# Patient Record
Sex: Female | Born: 1955 | Race: White | Hispanic: No | Smoking: Former smoker
Health system: Southern US, Community
[De-identification: ages and names within clinical notes are randomized; demographics above are authoritative.]

## PROBLEM LIST (undated history)

## (undated) DIAGNOSIS — I10 Essential (primary) hypertension: Secondary | ICD-10-CM

## (undated) DIAGNOSIS — K760 Fatty (change of) liver, not elsewhere classified: Secondary | ICD-10-CM

## (undated) DIAGNOSIS — E039 Hypothyroidism, unspecified: Secondary | ICD-10-CM

## (undated) DIAGNOSIS — F329 Major depressive disorder, single episode, unspecified: Secondary | ICD-10-CM

## (undated) DIAGNOSIS — Z87442 Personal history of urinary calculi: Secondary | ICD-10-CM

## (undated) DIAGNOSIS — M25569 Pain in unspecified knee: Secondary | ICD-10-CM

## (undated) DIAGNOSIS — M109 Gout, unspecified: Secondary | ICD-10-CM

## (undated) DIAGNOSIS — G2581 Restless legs syndrome: Secondary | ICD-10-CM

## (undated) DIAGNOSIS — M25519 Pain in unspecified shoulder: Secondary | ICD-10-CM

## (undated) DIAGNOSIS — M1990 Unspecified osteoarthritis, unspecified site: Secondary | ICD-10-CM

## (undated) DIAGNOSIS — I2699 Other pulmonary embolism without acute cor pulmonale: Secondary | ICD-10-CM

## (undated) DIAGNOSIS — Z8614 Personal history of Methicillin resistant Staphylococcus aureus infection: Secondary | ICD-10-CM

## (undated) DIAGNOSIS — I509 Heart failure, unspecified: Secondary | ICD-10-CM

## (undated) DIAGNOSIS — F32A Depression, unspecified: Secondary | ICD-10-CM

## (undated) DIAGNOSIS — D649 Anemia, unspecified: Secondary | ICD-10-CM

## (undated) DIAGNOSIS — N200 Calculus of kidney: Secondary | ICD-10-CM

## (undated) DIAGNOSIS — J45909 Unspecified asthma, uncomplicated: Secondary | ICD-10-CM

## (undated) DIAGNOSIS — K219 Gastro-esophageal reflux disease without esophagitis: Secondary | ICD-10-CM

## (undated) DIAGNOSIS — E119 Type 2 diabetes mellitus without complications: Secondary | ICD-10-CM

## (undated) HISTORY — PX: CHOLECYSTECTOMY: SHX55

## (undated) HISTORY — PX: THUMB ARTHROSCOPY: SHX2509

## (undated) HISTORY — PX: FOOT SURGERY: SHX648

## (undated) HISTORY — DX: Type 2 diabetes mellitus without complications: E11.9

## (undated) HISTORY — DX: Calculus of kidney: N20.0

## (undated) HISTORY — PX: TONSILLECTOMY: SUR1361

## (undated) HISTORY — PX: BACK SURGERY: SHX140

## (undated) HISTORY — DX: Gastro-esophageal reflux disease without esophagitis: K21.9

## (undated) HISTORY — PX: KNEE ARTHROSCOPY: SHX127

## (undated) HISTORY — PX: BREAST REDUCTION SURGERY: SHX8

## (undated) HISTORY — PX: ABDOMINAL HYSTERECTOMY: SHX81

## (undated) HISTORY — PX: GASTRIC BYPASS: SHX52

## (undated) MED FILL — Iron Sucrose Inj 20 MG/ML (Fe Equiv): INTRAVENOUS | Qty: 10 | Status: AC

---

## 2004-12-05 ENCOUNTER — Ambulatory Visit: Payer: Self-pay | Admitting: Unknown Physician Specialty

## 2005-02-04 ENCOUNTER — Ambulatory Visit: Payer: Self-pay | Admitting: Unknown Physician Specialty

## 2005-03-10 ENCOUNTER — Ambulatory Visit: Payer: Self-pay | Admitting: Surgery

## 2005-03-11 ENCOUNTER — Ambulatory Visit: Payer: Self-pay | Admitting: Surgery

## 2005-03-23 ENCOUNTER — Ambulatory Visit: Payer: Self-pay | Admitting: Unknown Physician Specialty

## 2006-02-14 ENCOUNTER — Emergency Department: Payer: Self-pay | Admitting: Emergency Medicine

## 2006-07-06 ENCOUNTER — Emergency Department: Payer: Self-pay | Admitting: Emergency Medicine

## 2006-11-03 ENCOUNTER — Ambulatory Visit: Payer: Self-pay

## 2008-01-14 ENCOUNTER — Emergency Department: Payer: Self-pay | Admitting: Emergency Medicine

## 2008-07-29 ENCOUNTER — Ambulatory Visit: Payer: Self-pay | Admitting: Family Medicine

## 2008-09-10 ENCOUNTER — Ambulatory Visit: Payer: Self-pay

## 2009-02-06 ENCOUNTER — Ambulatory Visit: Payer: Self-pay | Admitting: Family Medicine

## 2009-09-03 ENCOUNTER — Ambulatory Visit: Payer: Self-pay

## 2009-09-11 ENCOUNTER — Ambulatory Visit: Payer: Self-pay

## 2009-11-30 ENCOUNTER — Emergency Department: Payer: Self-pay | Admitting: Emergency Medicine

## 2009-11-30 DIAGNOSIS — I2699 Other pulmonary embolism without acute cor pulmonale: Secondary | ICD-10-CM

## 2009-11-30 HISTORY — DX: Other pulmonary embolism without acute cor pulmonale: I26.99

## 2010-11-16 ENCOUNTER — Emergency Department: Payer: Self-pay | Admitting: Emergency Medicine

## 2010-12-04 ENCOUNTER — Ambulatory Visit: Payer: Self-pay

## 2011-10-04 ENCOUNTER — Emergency Department: Payer: Self-pay | Admitting: Unknown Physician Specialty

## 2013-05-03 DIAGNOSIS — M7061 Trochanteric bursitis, right hip: Secondary | ICD-10-CM | POA: Insufficient documentation

## 2013-05-25 DIAGNOSIS — E538 Deficiency of other specified B group vitamins: Secondary | ICD-10-CM | POA: Insufficient documentation

## 2013-08-18 DIAGNOSIS — E785 Hyperlipidemia, unspecified: Secondary | ICD-10-CM | POA: Insufficient documentation

## 2013-09-20 ENCOUNTER — Ambulatory Visit (INDEPENDENT_AMBULATORY_CARE_PROVIDER_SITE_OTHER): Payer: BC Managed Care – PPO | Admitting: Podiatry

## 2013-09-20 ENCOUNTER — Encounter: Payer: Self-pay | Admitting: Podiatry

## 2013-09-20 ENCOUNTER — Ambulatory Visit (INDEPENDENT_AMBULATORY_CARE_PROVIDER_SITE_OTHER): Payer: BC Managed Care – PPO

## 2013-09-20 VITALS — BP 115/73 | HR 82 | Resp 16 | Ht 65.0 in | Wt 170.0 lb

## 2013-09-20 DIAGNOSIS — R52 Pain, unspecified: Secondary | ICD-10-CM

## 2013-09-20 DIAGNOSIS — IMO0002 Reserved for concepts with insufficient information to code with codable children: Secondary | ICD-10-CM

## 2013-09-20 DIAGNOSIS — G629 Polyneuropathy, unspecified: Secondary | ICD-10-CM

## 2013-09-20 DIAGNOSIS — G589 Mononeuropathy, unspecified: Secondary | ICD-10-CM

## 2013-09-20 NOTE — Patient Instructions (Signed)
WEAR CAM BOOT TO LEFT FOOT FOR FOUR WEEKS.  OUT OF WORK FOR 2 WEEKS.

## 2013-09-20 NOTE — Progress Notes (Signed)
Catalina presents today for chief complaint of a painful medial aspect of the left foot and some numbness to the medial longitudinal arch bilateral. States that the left foot was injured a few weeks ago not recalling the trauma. She states that she was seen by an orthopedist as well as urgent care who her in a Cam Walker for fracture. She relates that she did not wear the Cam Walker for very long due to her piriformis syndrome right and a torn the tendon in her left hip. At this point she has considerable swelling and pain in the medial foot. She's noted that the numbness in her arches a started spread.   Objective: Vital signs are stable she is alert and oriented x3. I have reviewed her past medical history medications and allergies. Pulses are palpable bilateral. She has pain on palpation of the tibialis anterior tendon at the insertion on the medial cuneiform left. Extremely tender on palpation. Body overlying edema. Radiographs evaluation does demonstrate what appears to be a mildly displaced fracture of the medial cuneiform with the attachment of the tibialis anterior tendon. She still has sensory input with normal neurologic sensorium per since once the monofilament.  Assessment: Fractured medial cuneiform probable tibialis anterior tendinitis. 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  Plan: We discussed etiology pathology conservative versus surgical therapies. I expressed to her that her best that would be to wear the Cam Walker for at least one month. She's allowed to ice this as much she wants to be she is not allowed to walk on it without the Cam Walker. She's going to continue to work on her on deficiency anemia with her primary Dr. Followup with her one month.

## 2013-10-06 DIAGNOSIS — L709 Acne, unspecified: Secondary | ICD-10-CM | POA: Insufficient documentation

## 2013-10-23 ENCOUNTER — Ambulatory Visit: Payer: BC Managed Care – PPO | Admitting: Podiatry

## 2014-02-26 DIAGNOSIS — M7710 Lateral epicondylitis, unspecified elbow: Secondary | ICD-10-CM | POA: Insufficient documentation

## 2014-04-10 DIAGNOSIS — A6000 Herpesviral infection of urogenital system, unspecified: Secondary | ICD-10-CM | POA: Insufficient documentation

## 2014-04-10 DIAGNOSIS — G4700 Insomnia, unspecified: Secondary | ICD-10-CM | POA: Insufficient documentation

## 2014-05-18 DIAGNOSIS — M19049 Primary osteoarthritis, unspecified hand: Secondary | ICD-10-CM | POA: Insufficient documentation

## 2014-05-18 DIAGNOSIS — M654 Radial styloid tenosynovitis [de Quervain]: Secondary | ICD-10-CM | POA: Insufficient documentation

## 2014-06-14 DIAGNOSIS — Z0000 Encounter for general adult medical examination without abnormal findings: Secondary | ICD-10-CM | POA: Insufficient documentation

## 2014-08-16 DIAGNOSIS — Z86010 Personal history of colonic polyps: Secondary | ICD-10-CM | POA: Insufficient documentation

## 2014-10-22 DIAGNOSIS — Z9884 Bariatric surgery status: Secondary | ICD-10-CM | POA: Insufficient documentation

## 2014-11-05 ENCOUNTER — Ambulatory Visit: Payer: Self-pay | Admitting: Ophthalmology

## 2014-11-15 DIAGNOSIS — J209 Acute bronchitis, unspecified: Secondary | ICD-10-CM | POA: Insufficient documentation

## 2014-11-15 DIAGNOSIS — J208 Acute bronchitis due to other specified organisms: Secondary | ICD-10-CM | POA: Insufficient documentation

## 2014-11-30 DIAGNOSIS — Z8614 Personal history of Methicillin resistant Staphylococcus aureus infection: Secondary | ICD-10-CM

## 2014-11-30 HISTORY — DX: Personal history of Methicillin resistant Staphylococcus aureus infection: Z86.14

## 2015-04-27 ENCOUNTER — Emergency Department
Admission: EM | Admit: 2015-04-27 | Discharge: 2015-04-27 | Disposition: A | Discharge status: Home / Self Care | Payer: BC Managed Care – PPO | Attending: Emergency Medicine | Admitting: Emergency Medicine

## 2015-04-27 ENCOUNTER — Emergency Department: Payer: BC Managed Care – PPO

## 2015-04-27 ENCOUNTER — Encounter: Payer: Self-pay | Admitting: Emergency Medicine

## 2015-04-27 DIAGNOSIS — Z720 Tobacco use: Secondary | ICD-10-CM | POA: Diagnosis not present

## 2015-04-27 DIAGNOSIS — Y9289 Other specified places as the place of occurrence of the external cause: Secondary | ICD-10-CM | POA: Insufficient documentation

## 2015-04-27 DIAGNOSIS — Z880 Allergy status to penicillin: Secondary | ICD-10-CM | POA: Insufficient documentation

## 2015-04-27 DIAGNOSIS — Y998 Other external cause status: Secondary | ICD-10-CM | POA: Insufficient documentation

## 2015-04-27 DIAGNOSIS — Y9389 Activity, other specified: Secondary | ICD-10-CM | POA: Diagnosis not present

## 2015-04-27 DIAGNOSIS — E119 Type 2 diabetes mellitus without complications: Secondary | ICD-10-CM | POA: Diagnosis not present

## 2015-04-27 DIAGNOSIS — Z79899 Other long term (current) drug therapy: Secondary | ICD-10-CM | POA: Insufficient documentation

## 2015-04-27 DIAGNOSIS — X58XXXA Exposure to other specified factors, initial encounter: Secondary | ICD-10-CM | POA: Insufficient documentation

## 2015-04-27 DIAGNOSIS — S8992XA Unspecified injury of left lower leg, initial encounter: Secondary | ICD-10-CM | POA: Diagnosis present

## 2015-04-27 DIAGNOSIS — S86912A Strain of unspecified muscle(s) and tendon(s) at lower leg level, left leg, initial encounter: Secondary | ICD-10-CM

## 2015-04-27 MED ORDER — TRAMADOL HCL 50 MG PO TABS: 50.0000 mg | ORAL_TABLET | Freq: Two times a day (BID) | ORAL | Status: DC

## 2015-04-27 NOTE — ED Notes (Signed)
No fall

## 2015-04-27 NOTE — Discharge Instructions (Signed)
Knee Sprain A knee sprain is a tear in one of the strong, fibrous tissues that connect the bones (ligaments) in your knee. The severity of the sprain depends on how much of the ligament is torn. The tear can be either partial or complete. CAUSES  Often, sprains are a result of a fall or injury. The force of the impact causes the fibers of your ligament to stretch too much. This excess tension causes the fibers of your ligament to tear. SIGNS AND SYMPTOMS  You may have some loss of motion in your knee. Other symptoms include:  Bruising.  Pain in the knee area.  Tenderness of the knee to the touch.  Swelling. DIAGNOSIS  To diagnose a knee sprain, your health care provider will physically examine your knee. Your health care provider may also suggest an X-ray exam of your knee to make sure no bones are broken. TREATMENT  If your ligament is only partially torn, treatment usually involves keeping the knee in a fixed position (immobilization) or bracing your knee for activities that require movement for several weeks. To do this, your health care provider will apply a bandage, cast, or splint to keep your knee from moving and to support your knee during movement until it heals. For a partially torn ligament, the healing process usually takes 4-6 weeks. If your ligament is completely torn, depending on which ligament it is, you may need surgery to reconnect the ligament to the bone or reconstruct it. After surgery, a cast or splint may be applied and will need to stay on your knee for 4-6 weeks while your ligament heals. HOME CARE INSTRUCTIONS  Keep your injured knee elevated to decrease swelling.  To ease pain and swelling, apply ice to the injured area:  Put ice in a plastic bag.  Place a towel between your skin and the bag.  Leave the ice on for 20 minutes, 2-3 times a day.  Only take medicine for pain as directed by your health care provider.  Do not leave your knee unprotected until  pain and stiffness go away (usually 4-6 weeks).  If you have a cast or splint, do not allow it to get wet. If you have been instructed not to remove it, cover it with a plastic bag when you shower or bathe. Do not swim.  Your health care provider may suggest exercises for you to do during your recovery to prevent or limit permanent weakness and stiffness. SEEK IMMEDIATE MEDICAL CARE IF:  Your cast or splint becomes damaged.  Your pain becomes worse.  You have significant pain, swelling, or numbness below the cast or splint. MAKE SURE YOU:  Understand these instructions.  Will watch your condition.  Will get help right away if you are not doing well or get worse. Document Released: 11/16/2005 Document Revised: 09/06/2013 Document Reviewed: 06/28/2013 [facility] Patient Information 2015 [place], [place]. This information is not intended to replace advice given to you by your health care provider. Make sure you discuss any questions you have with your health care provider.  Wear the knee immobilizer as needed for pain relief.  Follow-up with ortho for continued problems.  Rest with the knee elevated and apply ice to reduce symptoms.

## 2015-04-27 NOTE — ED Provider Notes (Signed)
[facility] Emergency Department Provider Note ____________________________________________  Time seen: 1517  I have reviewed the triage vital signs and the nursing notes.  HISTORY  Chief Complaint Knee Pain  HPI Courtney Murphy is a 59 y.o. female with a 2-week c/o  Increasing left knee pain.  She denies  Injury until today when she describes stepping in a divet in the lawn. She twisted her knee causing pain and some mild swelling. She notes pain laterally and inferiorly. She has been weight-bearing with pain, but denies, catch, lock or giveway.   Past Medical History  Diagnosis Date  . Diabetes mellitus without complication    There are no active problems to display for this patient.  Past Surgical History  Procedure Laterality Date  . Gastric bypass    . Abdominal hysterectomy    . Foot surgery    . Breast reduction surgery     Current Outpatient Rx  Name  Route  Sig  Dispense  Refill  . Biotin 7500 MCG TABS   Oral   Take 7,500 mcg by mouth daily.         Marland Kitchen escitalopram (LEXAPRO) 20 MG tablet   Oral   Take 20 mg by mouth 2 (two) times daily.         . furosemide (LASIX) 20 MG tablet   Oral   Take 20 mg by mouth.         . levothyroxine (SYNTHROID, LEVOTHROID) 100 MCG tablet   Oral   Take 100 mcg by mouth daily before breakfast.         . lisinopril (PRINIVIL,ZESTRIL) 20 MG tablet   Oral   Take 20 mg by mouth daily.         . pramipexole (MIRAPEX) 0.5 MG tablet   Oral   Take 0.5 mg by mouth daily.         . traMADol (ULTRAM) 50 MG tablet   Oral   Take 50 mg by mouth as needed for pain.         . traMADol (ULTRAM) 50 MG tablet   Oral   Take 1 tablet (50 mg total) by mouth 2 (two) times daily.   10 tablet   0    Allergies Penicillins and Sulfa antibiotics  No family history on file.  Social History History  Substance Use Topics  . Smoking status: Current Every Day Smoker -- 0.50 packs/day    Types:  Cigarettes  . Smokeless tobacco: Never Used  . Alcohol Use: No   Review of Systems  Constitutional: Negative for fever. Eyes: Negative for visual changes. ENT: Negative for sore throat. Cardiovascular: Negative for chest pain. Respiratory: Negative for shortness of breath. Gastrointestinal: Negative for abdominal pain, vomiting and diarrhea. Genitourinary: Negative for dysuria. Musculoskeletal: Negative for back pain. Positive for knee pain. Skin: Negative for rash. Neurological: Negative for headaches, focal weakness or numbness. ____________________________________________  PHYSICAL EXAM:  VITAL SIGNS: ED Triage Vitals  Enc Vitals Group     BP 04/27/15 1425 129/60 mmHg     Pulse Rate 04/27/15 1425 97     Resp 04/27/15 1425 18     Temp 04/27/15 1425 97.9 F (36.6 C)     Temp Source 04/27/15 1425 Oral     SpO2 04/27/15 1425 99 %     Weight 04/27/15 1425 190 lb (86.183 kg)     Height 04/27/15 1425 5\' 4"  (1.626 m)     Head Cir --  Peak Flow --      Pain Score 04/27/15 1425 10     Pain Loc --      Pain Edu? --      Excl. in [place]? --    Constitutional: Alert and oriented. Well appearing and in no distress. Eyes:Normocephalic and atraumatic. Conjunctivae are normal. PERRL. Normal extraocular movements. Cardiovascular: Normal rate, regular rhythm.  Respiratory: Normal respiratory effort. Musculoskeletal: Left knee without deformity, erythema or swelling.  Normal full ROM. No valgus or varus joint stress. Minimally tender to lateral joint line. Normal patella tracking with some minimal clicking.  Neurologic:  Normal speech and language. No gross focal neurologic deficits are appreciated. Skin:  Skin is warm, dry and intact. No rash noted. Psychiatric: Mood and affect are normal. Patient exhibits appropriate insight and judgment.  RADIOLOGY  Left Knee IMPRESSION: No fracture or joint abnormality. ____________________________________________  PROCEDURES Knee  immobilizer applied to the left knee for support.  ____________________________________________  INITIAL IMPRESSION / ASSESSMENT AND PLAN / ED COURSE  Left knee strain with small effusion.  Patient to follow-up with ortho or primary care provider. Rest, ice, elevate the left knee.   ____________________________________________  FINAL CLINICAL IMPRESSION(S) / ED DIAGNOSES  Final diagnoses:  Knee strain, left, initial encounter     Melvenia Needles, PA-C 04/27/15 1622  Harvest Dark, MD 04/28/15 1248

## 2015-04-30 DIAGNOSIS — M545 Low back pain, unspecified: Secondary | ICD-10-CM | POA: Insufficient documentation

## 2015-05-13 DIAGNOSIS — M65969 Unspecified synovitis and tenosynovitis, unspecified lower leg: Secondary | ICD-10-CM | POA: Insufficient documentation

## 2015-05-13 DIAGNOSIS — M659 Synovitis and tenosynovitis, unspecified: Secondary | ICD-10-CM | POA: Insufficient documentation

## 2015-06-18 DIAGNOSIS — M25512 Pain in left shoulder: Secondary | ICD-10-CM | POA: Insufficient documentation

## 2015-06-19 ENCOUNTER — Other Ambulatory Visit: Payer: Self-pay | Admitting: Unknown Physician Specialty

## 2015-06-19 DIAGNOSIS — M659 Synovitis and tenosynovitis, unspecified: Secondary | ICD-10-CM

## 2015-06-26 ENCOUNTER — Ambulatory Visit
Admission: RE | Admit: 2015-06-26 | Discharge: 2015-06-26 | Disposition: A | Discharge status: Home / Self Care | Payer: BC Managed Care – PPO | Source: Ambulatory Visit | Attending: Unknown Physician Specialty | Admitting: Unknown Physician Specialty

## 2015-06-26 DIAGNOSIS — X58XXXA Exposure to other specified factors, initial encounter: Secondary | ICD-10-CM | POA: Diagnosis not present

## 2015-06-26 DIAGNOSIS — S83282A Other tear of lateral meniscus, current injury, left knee, initial encounter: Secondary | ICD-10-CM | POA: Insufficient documentation

## 2015-06-26 DIAGNOSIS — M659 Synovitis and tenosynovitis, unspecified: Secondary | ICD-10-CM | POA: Diagnosis present

## 2015-06-26 DIAGNOSIS — M2242 Chondromalacia patellae, left knee: Secondary | ICD-10-CM | POA: Diagnosis not present

## 2015-07-11 DIAGNOSIS — S83209A Unspecified tear of unspecified meniscus, current injury, unspecified knee, initial encounter: Secondary | ICD-10-CM | POA: Insufficient documentation

## 2015-07-15 ENCOUNTER — Encounter: Payer: Self-pay | Admitting: *Deleted

## 2015-07-15 ENCOUNTER — Encounter
Admission: RE | Admit: 2015-07-15 | Discharge: 2015-07-15 | Disposition: A | Discharge status: Home / Self Care | Payer: BC Managed Care – PPO | Source: Ambulatory Visit | Attending: Anesthesiology | Admitting: Anesthesiology

## 2015-07-15 DIAGNOSIS — Z01810 Encounter for preprocedural cardiovascular examination: Secondary | ICD-10-CM | POA: Diagnosis present

## 2015-07-15 DIAGNOSIS — Z880 Allergy status to penicillin: Secondary | ICD-10-CM | POA: Diagnosis not present

## 2015-07-15 DIAGNOSIS — S83282A Other tear of lateral meniscus, current injury, left knee, initial encounter: Secondary | ICD-10-CM | POA: Diagnosis not present

## 2015-07-15 DIAGNOSIS — F329 Major depressive disorder, single episode, unspecified: Secondary | ICD-10-CM | POA: Diagnosis not present

## 2015-07-15 DIAGNOSIS — Z882 Allergy status to sulfonamides status: Secondary | ICD-10-CM | POA: Diagnosis not present

## 2015-07-15 DIAGNOSIS — Z9884 Bariatric surgery status: Secondary | ICD-10-CM | POA: Diagnosis not present

## 2015-07-15 DIAGNOSIS — M25562 Pain in left knee: Secondary | ICD-10-CM | POA: Diagnosis not present

## 2015-07-15 DIAGNOSIS — Z888 Allergy status to other drugs, medicaments and biological substances status: Secondary | ICD-10-CM | POA: Diagnosis not present

## 2015-07-15 DIAGNOSIS — Z79899 Other long term (current) drug therapy: Secondary | ICD-10-CM | POA: Diagnosis not present

## 2015-07-15 DIAGNOSIS — M25862 Other specified joint disorders, left knee: Secondary | ICD-10-CM | POA: Diagnosis not present

## 2015-07-15 DIAGNOSIS — E119 Type 2 diabetes mellitus without complications: Secondary | ICD-10-CM | POA: Diagnosis not present

## 2015-07-15 DIAGNOSIS — I10 Essential (primary) hypertension: Secondary | ICD-10-CM | POA: Diagnosis not present

## 2015-07-15 DIAGNOSIS — X58XXXA Exposure to other specified factors, initial encounter: Secondary | ICD-10-CM | POA: Diagnosis not present

## 2015-07-15 DIAGNOSIS — Z87891 Personal history of nicotine dependence: Secondary | ICD-10-CM | POA: Diagnosis not present

## 2015-07-15 DIAGNOSIS — K219 Gastro-esophageal reflux disease without esophagitis: Secondary | ICD-10-CM | POA: Diagnosis not present

## 2015-07-15 DIAGNOSIS — E039 Hypothyroidism, unspecified: Secondary | ICD-10-CM | POA: Diagnosis not present

## 2015-07-15 NOTE — Patient Instructions (Addendum)
  Your procedure is scheduled on: 07/19/15 Friday  Report to Day Surgery. To find out your arrival time please call [phone] between 1PM - 3PM on 07/18/15 Thurs  Remember: Instructions that are not followed completely may result in serious medical risk, up to and including death, or upon the discretion of your surgeon and anesthesiologist your surgery may need to be rescheduled.    __x__ 1. Do not eat food or drink liquids after midnight. No gum chewing or hard candies.     __x__ 2. No Alcohol for 24 hours before or after surgery.   ____ 3. Bring all medications with you on the day of surgery if instructed.    __x__ 4. Notify your doctor if there is any change in your medical condition     (cold, fever, infections).     Do not wear jewelry, make-up, hairpins, clips or nail polish.  Do not wear lotions, powders, or perfumes. You may wear deodorant.  Do not shave 48 hours prior to surgery. Men may shave face and neck.  Do not bring valuables to the hospital.    [facility] is not responsible for any belongings or valuables.               Contacts, dentures or bridgework may not be worn into surgery.  Leave your suitcase in the car. After surgery it may be brought to your room.  For patients admitted to the hospital, discharge time is determined by your                treatment team.   Patients discharged the day of surgery will not be allowed to drive home.   Please read over the following fact sheets that you were given:      _x___ Take these medicines the morning of surgery with A SIP OF WATER:    1.levothyroxine (SYNTHROID, LEVOTHROID) 100 MCG tablet  2. pramipexole (MIRAPEX) 0.5 MG tablet  3. Lexapro  4.  5.  6.  ____ Fleet Enema (as directed)   _x___ Use CHG Soap as directed  ____ Use inhalers on the day of surgery  ____ Stop metformin 2 days prior to surgery    ____ Take 1/2 of usual insulin dose the night before surgery and none on the morning of surgery.    ____ Stop Coumadin/Plavix/aspirin on   ____ Stop Anti-inflammatories on    ____ Stop supplements until after surgery.    ____ Bring C-Pap to the hospital.

## 2015-07-15 NOTE — Pre-Procedure Instructions (Signed)
Called Dr Scharlene Gloss office for orders.

## 2015-07-19 ENCOUNTER — Ambulatory Visit: Payer: BC Managed Care – PPO | Admitting: Anesthesiology

## 2015-07-19 ENCOUNTER — Encounter
Admission: RE | Disposition: A | Discharge status: Home / Self Care | Payer: Self-pay | Source: Ambulatory Visit | Attending: Unknown Physician Specialty

## 2015-07-19 ENCOUNTER — Ambulatory Visit
Admission: RE | Admit: 2015-07-19 | Discharge: 2015-07-19 | Disposition: A | Discharge status: Home / Self Care | Payer: BC Managed Care – PPO | Source: Ambulatory Visit | Attending: Unknown Physician Specialty | Admitting: Unknown Physician Specialty

## 2015-07-19 DIAGNOSIS — M25562 Pain in left knee: Secondary | ICD-10-CM | POA: Insufficient documentation

## 2015-07-19 DIAGNOSIS — Z79899 Other long term (current) drug therapy: Secondary | ICD-10-CM | POA: Insufficient documentation

## 2015-07-19 DIAGNOSIS — E119 Type 2 diabetes mellitus without complications: Secondary | ICD-10-CM | POA: Insufficient documentation

## 2015-07-19 DIAGNOSIS — E039 Hypothyroidism, unspecified: Secondary | ICD-10-CM | POA: Insufficient documentation

## 2015-07-19 DIAGNOSIS — S83282A Other tear of lateral meniscus, current injury, left knee, initial encounter: Secondary | ICD-10-CM | POA: Diagnosis not present

## 2015-07-19 DIAGNOSIS — Z880 Allergy status to penicillin: Secondary | ICD-10-CM | POA: Insufficient documentation

## 2015-07-19 DIAGNOSIS — Z888 Allergy status to other drugs, medicaments and biological substances status: Secondary | ICD-10-CM | POA: Insufficient documentation

## 2015-07-19 DIAGNOSIS — K219 Gastro-esophageal reflux disease without esophagitis: Secondary | ICD-10-CM | POA: Insufficient documentation

## 2015-07-19 DIAGNOSIS — M25862 Other specified joint disorders, left knee: Secondary | ICD-10-CM | POA: Insufficient documentation

## 2015-07-19 DIAGNOSIS — X58XXXA Exposure to other specified factors, initial encounter: Secondary | ICD-10-CM | POA: Insufficient documentation

## 2015-07-19 DIAGNOSIS — Z882 Allergy status to sulfonamides status: Secondary | ICD-10-CM | POA: Insufficient documentation

## 2015-07-19 DIAGNOSIS — F329 Major depressive disorder, single episode, unspecified: Secondary | ICD-10-CM | POA: Insufficient documentation

## 2015-07-19 DIAGNOSIS — Z87891 Personal history of nicotine dependence: Secondary | ICD-10-CM | POA: Insufficient documentation

## 2015-07-19 DIAGNOSIS — I10 Essential (primary) hypertension: Secondary | ICD-10-CM | POA: Insufficient documentation

## 2015-07-19 DIAGNOSIS — Z9884 Bariatric surgery status: Secondary | ICD-10-CM | POA: Insufficient documentation

## 2015-07-19 HISTORY — DX: Essential (primary) hypertension: I10

## 2015-07-19 HISTORY — DX: Major depressive disorder, single episode, unspecified: F32.9

## 2015-07-19 HISTORY — DX: Depression, unspecified: F32.A

## 2015-07-19 HISTORY — DX: Gout, unspecified: M10.9

## 2015-07-19 HISTORY — DX: Restless legs syndrome: G25.81

## 2015-07-19 HISTORY — DX: Anemia, unspecified: D64.9

## 2015-07-19 HISTORY — DX: Pain in unspecified knee: M25.569

## 2015-07-19 HISTORY — DX: Pain in unspecified shoulder: M25.519

## 2015-07-19 HISTORY — PX: KNEE ARTHROSCOPY: SHX127

## 2015-07-19 LAB — GLUCOSE, CAPILLARY: Glucose-Capillary: 96 mg/dL (ref 65–99)

## 2015-07-19 SURGERY — ARTHROSCOPY, KNEE
Anesthesia: General | Site: Knee | Laterality: Left | Wound class: Clean

## 2015-07-19 MED ORDER — EPHEDRINE SULFATE 50 MG/ML IJ SOLN
INTRAMUSCULAR | Status: DC | PRN
  Administered 2015-07-19: 10 mg via INTRAVENOUS

## 2015-07-19 MED ORDER — LIDOCAINE HCL (CARDIAC) 20 MG/ML IV SOLN
INTRAVENOUS | Status: DC | PRN
  Administered 2015-07-19: 80 mg via INTRAVENOUS

## 2015-07-19 MED ORDER — FENTANYL CITRATE (PF) 100 MCG/2ML IJ SOLN: 25.0000 ug | INTRAMUSCULAR | Status: DC | PRN

## 2015-07-19 MED ORDER — MIDAZOLAM HCL 2 MG/2ML IJ SOLN
INTRAMUSCULAR | Status: DC | PRN
  Administered 2015-07-19: 2 mg via INTRAVENOUS

## 2015-07-19 MED ORDER — ONDANSETRON HCL 4 MG/2ML IJ SOLN: 4.0000 mg | Freq: Once | INTRAMUSCULAR | Status: DC | PRN

## 2015-07-19 MED ORDER — NORCO 5-325 MG PO TABS: 1.0000 | ORAL_TABLET | Freq: Four times a day (QID) | ORAL | Status: DC | PRN

## 2015-07-19 MED ORDER — ACETAMINOPHEN 10 MG/ML IV SOLN
INTRAVENOUS | Status: AC
  Filled 2015-07-19: qty 100

## 2015-07-19 MED ORDER — FENTANYL CITRATE (PF) 100 MCG/2ML IJ SOLN
INTRAMUSCULAR | Status: DC | PRN
  Administered 2015-07-19 (×2): 50 ug via INTRAVENOUS

## 2015-07-19 MED ORDER — PROPOFOL 10 MG/ML IV BOLUS
INTRAVENOUS | Status: DC | PRN
  Administered 2015-07-19: 150 mg via INTRAVENOUS

## 2015-07-19 MED ORDER — ACETAMINOPHEN 10 MG/ML IV SOLN
INTRAVENOUS | Status: DC | PRN
  Administered 2015-07-19: 1000 mg via INTRAVENOUS

## 2015-07-19 MED ORDER — ONDANSETRON HCL 4 MG/2ML IJ SOLN
INTRAMUSCULAR | Status: DC | PRN
  Administered 2015-07-19: 4 mg via INTRAVENOUS

## 2015-07-19 MED ORDER — BUPIVACAINE HCL (PF) 0.5 % IJ SOLN
INTRAMUSCULAR | Status: DC | PRN
  Administered 2015-07-19: 20 mL

## 2015-07-19 MED ORDER — SODIUM CHLORIDE 0.9 % IV SOLN
INTRAVENOUS | Status: DC
  Administered 2015-07-19: 10:00:00 via INTRAVENOUS

## 2015-07-19 MED ORDER — BUPIVACAINE HCL (PF) 0.5 % IJ SOLN
INTRAMUSCULAR | Status: AC
  Filled 2015-07-19: qty 30

## 2015-07-19 SURGICAL SUPPLY — 38 items
ARTHROWAND PARAGON T2 (SURGICAL WAND) ×2
BLADE ABRADER 4.5 (BLADE) ×2 IMPLANT
BLADE SHAVER 4.5X7 STR FR (MISCELLANEOUS) ×2 IMPLANT
BNDG ESMARK 6X12 TAN STRL LF (GAUZE/BANDAGES/DRESSINGS) ×2 IMPLANT
BUR ABRADER 4.0 W/FLUTE AQUA (MISCELLANEOUS) IMPLANT
BURR ABRADER 4.0 W/FLUTE AQUA (MISCELLANEOUS)
CHLORAPREP W/TINT 26ML (MISCELLANEOUS) ×2 IMPLANT
DRAPE LEGGINS SURG 28X43 STRL (DRAPES) ×2 IMPLANT
GAUZE SPONGE 4X4 12PLY STRL (GAUZE/BANDAGES/DRESSINGS) ×2 IMPLANT
GLOVE BIO SURGEON STRL SZ7.5 (GLOVE) ×2 IMPLANT
GLOVE BIO SURGEON STRL SZ8 (GLOVE) ×2 IMPLANT
GLOVE INDICATOR 8.0 STRL GRN (GLOVE) ×2 IMPLANT
GOWN STRL REUS W/ TWL LRG LVL3 (GOWN DISPOSABLE) ×1 IMPLANT
GOWN STRL REUS W/TWL LRG LVL3 (GOWN DISPOSABLE) ×1
GOWN STRL REUS W/TWL LRG LVL4 (GOWN DISPOSABLE) ×2 IMPLANT
IV LACTATED RINGER IRRG 3000ML (IV SOLUTION) ×2
IV LR IRRIG 3000ML ARTHROMATIC (IV SOLUTION) ×2 IMPLANT
KIT RM TURNOVER STRD PROC AR (KITS) ×2 IMPLANT
MANIFOLD 4PT FOR NEPTUNE1 (MISCELLANEOUS) ×2 IMPLANT
PACK ARTHROSCOPY KNEE (MISCELLANEOUS) ×2 IMPLANT
PADDING CAST 4IN STRL (MISCELLANEOUS)
PADDING CAST BLEND 4X4 STRL (MISCELLANEOUS) IMPLANT
SET TUBE SUCT SHAVER OUTFL 24K (TUBING) ×2 IMPLANT
SET TUBE TIP INTRA-ARTICULAR (MISCELLANEOUS) ×2 IMPLANT
SOL PREP PVP 2OZ (MISCELLANEOUS) ×2
SOLUTION PREP PVP 2OZ (MISCELLANEOUS) ×1 IMPLANT
SUT ETH BLK MONO 3 0 FS 1 12/B (SUTURE) ×2 IMPLANT
SUT ETHILON 3-0 FS-10 30 BLK (SUTURE) ×2
SUTURE EHLN 3-0 FS-10 30 BLK (SUTURE) ×1 IMPLANT
TAPE MICROFOAM 4IN (TAPE) ×2 IMPLANT
TUBING ARTHRO INFLOW-ONLY STRL (TUBING) ×2 IMPLANT
WAND 30 DEG SABER W/CORD (SURGICAL WAND) IMPLANT
WAND ARTHRO PARAGON T2 (SURGICAL WAND) ×1 IMPLANT
WAND COVAC 50 IFS (MISCELLANEOUS) IMPLANT
WAND COVAC 70 (MISCELLANEOUS) ×2 IMPLANT
WAND HAND CNTRL MULTIVAC 50 (MISCELLANEOUS) ×2 IMPLANT
WAND HAND CNTRL MULTIVAC 90 (MISCELLANEOUS) ×2 IMPLANT
WRAP KNEE W/COLD PACKS 25.5X14 (SOFTGOODS) ×2 IMPLANT

## 2015-07-19 NOTE — Transfer of Care (Signed)
Immediate Anesthesia Transfer of Care Note  Patient: Courtney Murphy  Procedure(s) Performed: Procedure(s): ARTHROSCOPY KNEE (Left)  Patient Location: PACU  Anesthesia Type:General  Level of Consciousness: awake, alert  and oriented    Airway & Oxygen Therapy: Patient Spontanous Breathing and Patient connected to face mask oxygen  Post-op Assessment: Report given to RN and Post -op Vital signs reviewed and stable  Post vital signs: Reviewed and stable  Last Vitals: 97.1 temp 100% sat 76 hr 20 resp 117/81 Filed Vitals:   07/19/15 0810  BP: 118/77  Pulse: 68  Temp: 36.7 C  Resp: 504    Complications: No apparent anesthesia complications

## 2015-07-19 NOTE — H&P (Signed)
  H and P reviewed. No changes. Uploaded at later date. 

## 2015-07-19 NOTE — Anesthesia Preprocedure Evaluation (Addendum)
Anesthesia Evaluation  Patient identified by MRN, date of birth, ID band Patient awake    Reviewed: Allergy & Precautions, NPO status , Patient's Chart, lab work & pertinent test results  History of Anesthesia Complications Negative for: history of anesthetic complications  Airway Mallampati: II  TM Distance: >3 FB Neck ROM: Full    Dental  (+) Teeth Intact   Pulmonary neg pulmonary ROS, former smoker (quit x 6 months),          Cardiovascular hypertension (no meds now), Pt. on medications     Neuro/Psych Depression    GI/Hepatic   Endo/Other  diabetes (prior to gastric bypass)Hypothyroidism   Renal/GU      Musculoskeletal   Abdominal   Peds  Hematology  (+) anemia ,   Anesthesia Other Findings   Reproductive/Obstetrics                            Anesthesia Physical Anesthesia Plan  ASA: II  Anesthesia Plan: General   Post-op Pain Management:    Induction: Intravenous  Airway Management Planned: LMA  Additional Equipment:   Intra-op Plan:   Post-operative Plan:   Informed Consent: I have reviewed the patients History and Physical, chart, labs and discussed the procedure including the risks, benefits and alternatives for the proposed anesthesia with the patient or authorized representative who has indicated his/her understanding and acceptance.     Plan Discussed with:   Anesthesia Plan Comments:         Anesthesia Quick Evaluation

## 2015-07-19 NOTE — Anesthesia Postprocedure Evaluation (Signed)
  Anesthesia Post-op Note  Patient: Courtney Murphy  Procedure(s) Performed: Procedure(s): ARTHROSCOPY KNEE (Left)  Anesthesia type:General  Patient location: PACU  Post pain: Pain level controlled  Post assessment: Post-op Vital signs reviewed, Patient's Cardiovascular Status Stable, Respiratory Function Stable, Patent Airway and No signs of Nausea or vomiting  Post vital signs: Reviewed and stable  Last Vitals:  Filed Vitals:   07/19/15 1228  BP: 126/71  Pulse: 70  Temp: 36.7 C  Resp: 16    Level of consciousness: awake, alert  and patient cooperative  Complications: No apparent anesthesia complications

## 2015-07-19 NOTE — Anesthesia Procedure Notes (Signed)
Procedure Name: LMA Insertion Date/Time: 07/19/2015 9:44 AM Performed by: Delaney Meigs Pre-anesthesia Checklist: Patient identified, Emergency Drugs available, Suction available, Patient being monitored and Timeout performed Patient Re-evaluated:Patient Re-evaluated prior to inductionOxygen Delivery Method: Circle system utilized and Simple face mask Preoxygenation: Pre-oxygenation with 100% oxygen Intubation Type: IV induction Ventilation: Mask ventilation without difficulty LMA Size: 4.5 Number of attempts: 1 Placement Confirmation: breath sounds checked- equal and bilateral and positive ETCO2 Tube secured with: Tape

## 2015-07-19 NOTE — Discharge Instructions (Signed)
° °  [facility]  Kathrene Alu., M.D. [phone]   KNEE ARTHROSCOPY POST OPERATION INSTRUCTIONS:  PLEASE READ THESE INSTRUCTIONS ABOUT POST OPERATION CARE. THEY WILL ANSWER MOST OF YOUR QUESTIONS.  You have been given a prescription for pain. Please take as directed for pain.  You can walk, keeping the knee slightly stiff-avoid doing too much bending the first day. (if ACL reconstruction is performed, keep brace locked in extension when walking.)  You will use crutches or cane if needed. Can weight bear as tolerated  Plan to take three to four days off from work. You can resume work when you are comfortable. (This can be a week or more, depending on the type of work you do.)  To reduce pain and swelling, place one to two pillows under the knee the first two or three days when sitting or lying. An ice pack may be placed on top of the area over the dressing. Instructions for making homemade icepack are as follow:  Flexible homemade alcohol water ice pack  2 cups water  1 cup rubbing alcohol  food coloring for the blue tint (optional)  2 zip-top bags - gallon-size  Mix the water and alcohol together in one of your zip-top bags and add food coloring. Release as much air as possible and seal the bag. Place in freezer for at least 12 hours.  The small incisions in your knee are closed with nylon stitches. They will be removed in the office.  The bulky dressing may be removed in the third day after surgery. (If ACL surgery-DO NOT REMOVE BANDAGES). Put a waterproof band-aid over each stitch. Do not put any creams or ointments on wounds. You may shower at this time, but change waterproof band-aids after showering. KEEP INCISIONS CLEAN AND DRY UNTIL YOU RETURN TO THE OFFICE.  Sometimes the operative area remains somewhat painful and swollen for several weeks. This is usually nothing to worry about, but call if you have any excessive symptoms, especially  fever. It is not unusual to have a low grade fever of 99 degrees for the first few days. If persist after 3-4 days call the office. It is not uncommon for the pain to be a little worse on the third day after surgery.  Begin doing gentle exercises right away. They will be limited by the amount of pain and swelling you have.  Exercising will reduce the swelling, increase motion, and prevent muscle weakness. Exercises: Straight leg raising and gentle knee bending.  Take 81 milligram aspirin twice a day for 2 weeks after meals or milk. This along with elevation will help reduce the possibility of phlebitis in your operated leg.  Avoid strenuous athletics for a minimum of 4 to 6 weeks after arthroscopic surgery (approximately five months if ACL surgery).  If the surgery included ACL reconstruction the brace that is supplied to the extremity post surgery is to be locked in extension when you are asleep and is to be locked in extension when you are ambulating. It can be unlocked for exercises or sitting.  Keep your post surgery appointment that has been made for you. If you do not remember the date call [phone]. Your follow up appointment should be between 7-10 days.

## 2015-07-19 NOTE — Op Note (Signed)
Patient: Courtney Murphy  Preoperative diagnosis: Torn lateral meniscus left knee  Postop diagnosis: Torn lateral meniscus left knee along with grade 3 medial femoral chondral lesion  Operation: Arthroscopic partial lateral meniscectomy plus coblation of the medial femoral chondral lesion  Surgeon: Vilinda Flake, MD  Anesthesia: Gen.   History: Patient's had a long history of left knee pain.  The plain films revealed minimal medial compartment narrowing .  The patient had an MRI which revealed a torn lateral meniscus and some thinning and irregularity of the articular cartilage of the central portion of the medial femoral condyle.The patient was scheduled for surgery due to persistent discomfort despite conservative treatment.  The patient was taken the operating room where satisfactory general anesthesia was achieved. A tourniquet and leg holder were was applied to the left thigh. The left lower extremity was supported with a well leg holder. The left knee was prepped and draped in usual fashion for an arthroscopic procedure. An inflow cannula was introduced superomedially. The joint was distended with lactated Ringer's. Scope was introduced through an inferolateral puncture wound and a probe through an inferomedial puncture wound. Inspection of the medial compartment revealed a grade 3 chondral lesion in the mid weightbearing portion of the medial femoral condyle. Inspection of the intercondylar notch revealed normal cruciates. Inspection of the the lateral compartment revealed a complex tear of the lateral meniscus that involved primarily the anterior and middle thirds of the meniscus. I went ahead and performed a partial lateral meniscectomy using a combination of basket biters, motorized resector an [employer] care radiofrequency wand. I then coblated the medial femoral chondral lesion. Trochlear groove was inspected and appeared to be fairly smooth.  Retropatellar surface was also fairly  smooth. I observed patella tracking and indeed the patella seemed to track fairly well.  The instruments were removed from the joint at this time. The puncture wounds were closed with 3-0 nylon in vertical mattress fashion. I injected each puncture wound with several cc of half percent Marcaine without epinephrine. Betadine was applied the wounds followed by sterile dressing. An ice pack was applied to the right knee. The patient was awakened and transferred to the stretcher bed. The patient was taken to the recovery room in satisfactory condition.  The tourniquet was not inflated during the course of the procedure. Blood loss was negligible.

## 2015-08-13 DIAGNOSIS — M5416 Radiculopathy, lumbar region: Secondary | ICD-10-CM | POA: Insufficient documentation

## 2015-10-11 ENCOUNTER — Encounter: Payer: Self-pay | Admitting: Unknown Physician Specialty

## 2015-10-11 ENCOUNTER — Ambulatory Visit (INDEPENDENT_AMBULATORY_CARE_PROVIDER_SITE_OTHER): Payer: BC Managed Care – PPO | Admitting: Unknown Physician Specialty

## 2015-10-11 VITALS — BP 152/98 | HR 80 | Temp 98.6°F | Ht 64.0 in | Wt 183.2 lb

## 2015-10-11 DIAGNOSIS — F900 Attention-deficit hyperactivity disorder, predominantly inattentive type: Secondary | ICD-10-CM

## 2015-10-11 DIAGNOSIS — Z23 Encounter for immunization: Secondary | ICD-10-CM

## 2015-10-11 DIAGNOSIS — E039 Hypothyroidism, unspecified: Secondary | ICD-10-CM | POA: Insufficient documentation

## 2015-10-11 DIAGNOSIS — K219 Gastro-esophageal reflux disease without esophagitis: Secondary | ICD-10-CM

## 2015-10-11 DIAGNOSIS — F909 Attention-deficit hyperactivity disorder, unspecified type: Secondary | ICD-10-CM | POA: Insufficient documentation

## 2015-10-11 HISTORY — DX: Gastro-esophageal reflux disease without esophagitis: K21.9

## 2015-10-11 NOTE — Progress Notes (Signed)
BP 152/98 mmHg  Pulse 80  Temp(Src) 98.6 F (37 C)  Ht 5\' 4"  (1.626 m)  Wt 183 lb 3.2 oz (83.099 kg)  BMI 31.43 kg/m2  SpO2 98%   Subjective:    Patient ID: Courtney Murphy, female    DOB: 26-Jun-1956, 59 y.o.   MRN: [id number]  HPI: Courtney Murphy is a 59 y.o. female  Chief Complaint  Patient presents with  . Medication Problem    pt states she wants to get put back on strattera   Pt would like to go back on Strattera.  She has a primary care doctor in [place].  She had been treated here by Dr. Jeananne Rama in 2014 who did a TOVA test.  She is now having trouble focusing and has so much going on she is having trouble focusing and getting motivated.  States it is "too much."  She remembers a TOVA test though in review of the chart, it is not there.  Her history of illness is as follows:  This has been an issue for her since childhood. She always was able to pass grades, but always took a long time to get through homework and had problems paying attention in class. 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     Relevant past medical, surgical, family and social history reviewed and updated as indicated. Interim medical history since our last visit reviewed. Allergies and medications reviewed and updated.  Review of Systems  Per HPI unless specifically indicated above     Objective:    BP 152/98 mmHg  Pulse 80  Temp(Src) 98.6 F (37 C)  Ht 5\' 4"  (1.626 m)  Wt 183 lb 3.2 oz (83.099 kg)  BMI 31.43 kg/m2  SpO2 98%  Wt Readings from Last 3 Encounters:  10/11/15 183 lb 3.2 oz (83.099 kg)  07/19/15 182 lb (82.555 kg)  04/27/15 190 lb (86.183 kg)    Physical Exam  Constitutional: She is oriented to person, place, and time. She appears well-developed and well-nourished. No distress.  HENT:  Head: Normocephalic and atraumatic.  Eyes: Conjunctivae and lids are normal. Right eye exhibits no discharge. Left eye exhibits no discharge. No  scleral icterus.  Cardiovascular: Normal rate and regular rhythm.   Pulmonary/Chest: Effort normal. No respiratory distress.  Abdominal: Normal appearance. There is no splenomegaly or hepatomegaly.  Musculoskeletal: Normal range of motion.  Neurological: She is alert and oriented to person, place, and time.  Skin: Skin is intact. No rash noted. No pallor.  Psychiatric: She has a normal mood and affect. Her behavior is normal. Judgment and thought content normal.    Results for orders placed or performed during the hospital encounter of 07/19/15  Glucose, capillary  Result Value Ref Range   Glucose-Capillary 96 65 - 99 mg/dL   Comment 1 Notify RN       Assessment & Plan:   Problem List Items Addressed This Visit      Unprioritized   ADHD (attention deficit hyperactivity disorder)    Pt has done well with Strattera and I'm willing to prescribe it.  But, pt must talk to her primary care doctor about coming off Lexapro as there is a contraindication.  I am not willing to prescribe a controlled substance if she is unable to stop her SSRI and would refer to psychiatry.         Other Visit Diagnoses    Immunization due    -  Primary    Relevant Orders    Flu Vaccine QUAD 36+ mos IM (Completed)        Follow up plan: Return for after discussion with her PCP.

## 2015-10-11 NOTE — Assessment & Plan Note (Signed)
Pt has done well with Strattera and I'm willing to prescribe it.  But, pt must talk to her primary care doctor about coming off Lexapro as there is a contraindication.  I am not willing to prescribe a controlled substance if she is unable to stop her SSRI and would refer to psychiatry.

## 2015-10-13 ENCOUNTER — Emergency Department
Admission: EM | Admit: 2015-10-13 | Discharge: 2015-10-13 | Disposition: A | Discharge status: Home / Self Care | Payer: BC Managed Care – PPO | Attending: Emergency Medicine | Admitting: Emergency Medicine

## 2015-10-13 ENCOUNTER — Encounter: Payer: Self-pay | Admitting: *Deleted

## 2015-10-13 DIAGNOSIS — Z87891 Personal history of nicotine dependence: Secondary | ICD-10-CM | POA: Diagnosis not present

## 2015-10-13 DIAGNOSIS — S46212A Strain of muscle, fascia and tendon of other parts of biceps, left arm, initial encounter: Secondary | ICD-10-CM | POA: Diagnosis not present

## 2015-10-13 DIAGNOSIS — X58XXXA Exposure to other specified factors, initial encounter: Secondary | ICD-10-CM | POA: Diagnosis not present

## 2015-10-13 DIAGNOSIS — S4992XA Unspecified injury of left shoulder and upper arm, initial encounter: Secondary | ICD-10-CM | POA: Diagnosis present

## 2015-10-13 DIAGNOSIS — Z880 Allergy status to penicillin: Secondary | ICD-10-CM | POA: Insufficient documentation

## 2015-10-13 DIAGNOSIS — G8929 Other chronic pain: Secondary | ICD-10-CM | POA: Diagnosis not present

## 2015-10-13 DIAGNOSIS — I10 Essential (primary) hypertension: Secondary | ICD-10-CM | POA: Diagnosis not present

## 2015-10-13 DIAGNOSIS — Y9389 Activity, other specified: Secondary | ICD-10-CM | POA: Diagnosis not present

## 2015-10-13 DIAGNOSIS — Z79899 Other long term (current) drug therapy: Secondary | ICD-10-CM | POA: Insufficient documentation

## 2015-10-13 DIAGNOSIS — Y998 Other external cause status: Secondary | ICD-10-CM | POA: Insufficient documentation

## 2015-10-13 DIAGNOSIS — Y9289 Other specified places as the place of occurrence of the external cause: Secondary | ICD-10-CM | POA: Diagnosis not present

## 2015-10-13 MED ORDER — KETOROLAC TROMETHAMINE 10 MG PO TABS: 10.0000 mg | ORAL_TABLET | Freq: Four times a day (QID) | ORAL | Status: DC | PRN

## 2015-10-13 MED ORDER — KETOROLAC TROMETHAMINE 60 MG/2ML IM SOLN
60.0000 mg | Freq: Once | INTRAMUSCULAR | Status: AC
  Administered 2015-10-13: 60 mg via INTRAMUSCULAR
  Filled 2015-10-13: qty 2

## 2015-10-13 NOTE — Discharge Instructions (Signed)

## 2015-10-13 NOTE — ED Notes (Addendum)
Pt c/o L shoulder pain that has been on-going for approximately a month. Pt has seen ortho and has a f/u appt scheduled w/ a shoulder specialist. Pt report that tonight, she experienced an increase in her pain when she attempted to catch something she had dropped. Pt states she has been taking Tylenol x 4 hrs ago and Aleve x 10 hrs ago w/o relief. Pt c/o decreased mobility in L arm. Pt drove self to the ED and has no other way to return home. Pt first began seeing an ortho doctor in July 2016

## 2015-10-13 NOTE — ED Provider Notes (Signed)
[facility] Emergency Department Provider Note  ____________________________________________  Time seen: 3:25 AM  I have reviewed the triage vital signs and the nursing notes.   HISTORY  Chief Complaint Shoulder Pain    HPI Courtney Murphy is a 59 y.o. female with chronic left shoulder pain who is planning to follow up with the [place] orthopedics specialist in one week who is leaning down today to pick up some and she dropped on the floor, and after bracing herself with her problematic left shoulder and left arm on the floor, she suddenly had pain in the left shoulder. It's been constant since then for the past 12 hours or so. Worse with movement. No fall, no serious trauma. No fever chills chest pain or shortness of breath.  Denies subluxation or limitation of range of motion. Has taken NSAIDs including Tylenol and Aleve without   Past Medical History  Diagnosis Date  . Knee pain     Left  . Shoulder pain   . Hypertension   . Anemia   . Depression   . Gout   . Restless leg syndrome   . Acid reflux 10/11/2015     Patient Active Problem List   Diagnosis Date Noted  . Acid reflux 10/11/2015  . Adult hypothyroidism 10/11/2015  . ADHD (attention deficit hyperactivity disorder) 10/11/2015  . Bariatric surgery status 10/22/2014  . HLD (hyperlipidemia) 08/18/2013     Past Surgical History  Procedure Laterality Date  . Gastric bypass    . Abdominal hysterectomy    . Foot surgery    . Breast reduction surgery    . Knee arthroscopy Left   . Cholecystectomy    . Tonsillectomy    . Thumb arthroscopy    . Hand arthroplasty Left   . Knee arthroscopy Left 07/19/2015    Procedure: ARTHROSCOPY KNEE;  Surgeon: Leanor Kail, MD;  Location: [facility] ORS;  Service: Orthopedics;  Laterality: Left;  . Back surgery       Current Outpatient Rx  Name  Route  Sig  Dispense  Refill  . B Complex Vitamins (VITAMIN B COMPLEX PO)   Oral   Take 1 tablet by mouth  daily.         . Biotin 7500 MCG TABS   Oral   Take 5,000 mcg by mouth daily.          . Cholecalciferol (VITAMIN D3) 1000 UNITS CAPS   Oral   Take 1 capsule by mouth daily.          Marland Kitchen escitalopram (LEXAPRO) 20 MG tablet   Oral   Take 20 mg by mouth daily.          . fluticasone (FLONASE) 50 MCG/ACT nasal spray   Each Nare   Place 1 spray into both nostrils 2 (two) times daily as needed.          . furosemide (LASIX) 20 MG tablet   Oral   Take 20 mg by mouth daily as needed.          Marland Kitchen ketorolac (TORADOL) 10 MG tablet   Oral   Take 1 tablet (10 mg total) by mouth every 6 (six) hours as needed for moderate pain.   8 tablet   0   . levothyroxine (SYNTHROID, LEVOTHROID) 100 MCG tablet   Oral   Take 100 mcg by mouth daily before breakfast.         . lisinopril (PRINIVIL,ZESTRIL) 20 MG tablet   Oral  Take 20 mg by mouth daily.         Marland Kitchen omega-3 acid ethyl esters (LOVAZA) 1 G capsule   Oral   Take 1 g by mouth 2 (two) times daily.         . pramipexole (MIRAPEX) 0.5 MG tablet   Oral   Take 0.5 mg by mouth at bedtime.             Allergies Nsaids; Penicillins; and Sulfa antibiotics No allergy to NSAIDs  Family History  Problem Relation Age of Onset  . Cancer Mother     breast  . Heart disease Father   . Diabetes Father   . Hypertension Father   . Cancer Maternal Aunt   . Cancer Maternal Uncle   . Heart disease Paternal Aunt   . Heart disease Paternal Uncle   . Cancer Maternal Grandmother   . Cancer Maternal Grandfather   . Heart disease Paternal Grandmother   . Heart disease Paternal Grandfather     Social History Social History  Substance Use Topics  . Smoking status: Former Smoker -- 0.50 packs/day    Types: Cigarettes    Quit date: 04/14/2015  . Smokeless tobacco: Never Used  . Alcohol Use: No    Review of Systems  Constitutional:   No fever or chills. No weight changes Eyes:   No blurry vision or double vision.  ENT:    No sore throat. Cardiovascular:   No chest pain. Respiratory:   No dyspnea or cough. Gastrointestinal:   Negative for abdominal pain, vomiting and diarrhea.  No BRBPR or melena. Genitourinary:   Negative for dysuria, urinary retention, bloody urine, or difficulty urinating. Musculoskeletal:   Acute on chronic left shoulder pain as above Skin:   Negative for rash. Neurological:   Negative for headaches, focal weakness or numbness. Psychiatric:  No anxiety or depression.   Endocrine:  No hot/cold intolerance, changes in energy, or sleep difficulty.  10-point ROS otherwise negative.  ____________________________________________   PHYSICAL EXAM:  VITAL SIGNS: ED Triage Vitals  Enc Vitals Group     BP 10/13/15 0117 158/88 mmHg     Pulse Rate 10/13/15 0117 69     Resp 10/13/15 0117 16     Temp 10/13/15 0117 98.2 F (36.8 C)     Temp Source 10/13/15 0117 Oral     SpO2 10/13/15 0117 97 %     Weight 10/13/15 0117 182 lb (82.555 kg)     Height 10/13/15 0117 5\' 4"  (1.626 m)     Head Cir --      Peak Flow --      Pain Score 10/13/15 0118 8     Pain Loc --      Pain Edu? --      Excl. in [place]? --      Constitutional:   Alert and oriented. Well appearing and in no distress. Eyes:   No scleral icterus. No conjunctival pallor. PERRL. EOMI ENT   Head:   Normocephalic and atraumatic.   Nose:   No congestion/rhinnorhea. No septal hematoma   Mouth/Throat:   MMM, no pharyngeal erythema. No peritonsillar mass. No uvula shift.   Neck:   No stridor. No SubQ emphysema. No meningismus. Hematological/Lymphatic/Immunilogical:   No cervical lymphadenopathy. Cardiovascular:   RRR. Normal and symmetric distal pulses are present in all extremities. No murmurs, rubs, or gallops. Respiratory:   Normal respiratory effort without tachypnea nor retractions. Breath sounds are clear and equal bilaterally. No wheezes/rales/rhonchi. Gastrointestinal:  Soft and nontender. No distention. There is  no CVA tenderness.  No rebound, rigidity, or guarding. Genitourinary:   deferred Musculoskeletal:   Tenderness over the lateral and anterior heads of the deltoid. Pain is exacerbated with forward flexion and abduction of the shoulder against resistance. Full range of motion. No bony tenderness.. Neurologic:   Normal speech and language.  CN 2-10 normal. Motor grossly intact. No pronator drift.  Normal gait. No gross focal neurologic deficits are appreciated.  Skin:    Skin is warm, dry and intact. No rash noted.  No petechiae, purpura, or bullae. Psychiatric:   Mood and affect are normal. Speech and behavior are normal. Patient exhibits appropriate insight and judgment.  ____________________________________________    LABS (pertinent positives/negatives) (all labs ordered are listed, but only abnormal results are displayed) Labs Reviewed - No data to display ____________________________________________   EKG    ____________________________________________    RADIOLOGY    ____________________________________________   PROCEDURES   ____________________________________________   INITIAL IMPRESSION / ASSESSMENT AND PLAN / ED COURSE  Pertinent labs & imaging results that were available during my care of the patient were reviewed by me and considered in my medical decision making (see chart for details).  Patient presents with bicipital tendinitis/muscle strain. We'll start on NSAID course for a few days and have her follow-up with orthopedics as scheduled. Low suspicion for fracture dislocation or soft tissue infection or septic arthritis.     ____________________________________________   FINAL CLINICAL IMPRESSION(S) / ED DIAGNOSES  Final diagnoses:  Biceps strain, left, initial encounter      Carrie Mew, MD 10/13/15 [phone]

## 2015-11-06 ENCOUNTER — Telehealth: Payer: Self-pay | Admitting: Unknown Physician Specialty

## 2015-11-06 MED ORDER — ATOMOXETINE HCL 40 MG PO CAPS: 80.0000 mg | ORAL_CAPSULE | Freq: Every day | ORAL | Status: DC

## 2015-11-06 NOTE — Telephone Encounter (Signed)
Needs to discuss medication change.

## 2015-11-06 NOTE — Telephone Encounter (Signed)
I wrote and sent to pharmacy.  Take 1 a day for 1 week and then increase to 2/day

## 2015-11-06 NOTE — Telephone Encounter (Signed)
Called patient and she stated that when she was here to see Malachy Mood a few weeks ago, Malachy Mood told her that in order for her to start straterra, then she had to stop taking citalopram. Patient stated that her primary doctor took her off the citalopram and that she is at the end of the tapering. Patient wants to know if she can go ahead and start straterra. [facility] [address].

## 2015-11-08 NOTE — Telephone Encounter (Signed)
Called and let patient know about prescription.

## 2015-12-23 ENCOUNTER — Telehealth: Payer: Self-pay

## 2015-12-23 ENCOUNTER — Emergency Department
Admission: EM | Admit: 2015-12-23 | Discharge: 2015-12-23 | Disposition: A | Discharge status: Home / Self Care | Payer: BC Managed Care – PPO | Attending: Emergency Medicine | Admitting: Emergency Medicine

## 2015-12-23 DIAGNOSIS — R222 Localized swelling, mass and lump, trunk: Secondary | ICD-10-CM | POA: Diagnosis present

## 2015-12-23 DIAGNOSIS — Z880 Allergy status to penicillin: Secondary | ICD-10-CM | POA: Diagnosis not present

## 2015-12-23 DIAGNOSIS — I10 Essential (primary) hypertension: Secondary | ICD-10-CM | POA: Insufficient documentation

## 2015-12-23 DIAGNOSIS — F329 Major depressive disorder, single episode, unspecified: Secondary | ICD-10-CM | POA: Insufficient documentation

## 2015-12-23 DIAGNOSIS — E119 Type 2 diabetes mellitus without complications: Secondary | ICD-10-CM | POA: Insufficient documentation

## 2015-12-23 DIAGNOSIS — Z79899 Other long term (current) drug therapy: Secondary | ICD-10-CM | POA: Diagnosis not present

## 2015-12-23 DIAGNOSIS — Z87891 Personal history of nicotine dependence: Secondary | ICD-10-CM | POA: Insufficient documentation

## 2015-12-23 DIAGNOSIS — B9689 Other specified bacterial agents as the cause of diseases classified elsewhere: Secondary | ICD-10-CM

## 2015-12-23 DIAGNOSIS — L089 Local infection of the skin and subcutaneous tissue, unspecified: Secondary | ICD-10-CM | POA: Diagnosis not present

## 2015-12-23 LAB — CBC WITH DIFFERENTIAL/PLATELET
BASOS ABS: 0.1 10*3/uL (ref 0–0.1)
BASOS PCT: 1 %
EOS PCT: 3 %
Eosinophils Absolute: 0.3 10*3/uL (ref 0–0.7)
HCT: 35.2 % (ref 35.0–47.0)
Hemoglobin: 11.8 g/dL — ABNORMAL LOW (ref 12.0–16.0)
LYMPHS PCT: 28 %
Lymphs Abs: 2.9 10*3/uL (ref 1.0–3.6)
MCH: 28.1 pg (ref 26.0–34.0)
MCHC: 33.6 g/dL (ref 32.0–36.0)
MCV: 83.7 fL (ref 80.0–100.0)
Monocytes Absolute: 0.7 10*3/uL (ref 0.2–0.9)
Monocytes Relative: 7 %
Neutro Abs: 6.3 10*3/uL (ref 1.4–6.5)
Neutrophils Relative %: 61 %
PLATELETS: 270 10*3/uL (ref 150–440)
RBC: 4.2 MIL/uL (ref 3.80–5.20)
RDW: 15.4 % — ABNORMAL HIGH (ref 11.5–14.5)
WBC: 10.3 10*3/uL (ref 3.6–11.0)

## 2015-12-23 LAB — BASIC METABOLIC PANEL
ANION GAP: 9 (ref 5–15)
BUN: 13 mg/dL (ref 6–20)
CO2: 29 mmol/L (ref 22–32)
Calcium: 9.1 mg/dL (ref 8.9–10.3)
Chloride: 104 mmol/L (ref 101–111)
Creatinine, Ser: 0.7 mg/dL (ref 0.44–1.00)
GFR calc Af Amer: >60 mL/min (ref >60)
Glucose, Bld: 116 mg/dL — ABNORMAL HIGH (ref 65–99)
POTASSIUM: 3.7 mmol/L (ref 3.5–5.1)
SODIUM: 142 mmol/L (ref 135–145)

## 2015-12-23 MED ORDER — CLINDAMYCIN HCL 150 MG PO CAPS: 150.0000 mg | ORAL_CAPSULE | Freq: Four times a day (QID) | ORAL | Status: DC

## 2015-12-23 NOTE — ED Provider Notes (Signed)
[facility] Emergency Department Provider Note  ____________________________________________  Time seen: Approximately 1:45 PM  I have reviewed the triage vital signs and the nursing notes.   HISTORY  Chief Complaint Abscess    HPI Courtney Murphy is a 60 y.o. female patient developed an a mass in the center of her chest 12 days ago. At states she went to a walk-in clinic on Saturday and was prescribed doxycycline, Mobic and told to apply warm compresses to the area . Patient stated no improvement since that visit. Patient denies any active draining at this time. She rated the pain as a 3/10. Patient denies any fever. Past Medical History  Diagnosis Date  . Knee pain     Left  . Shoulder pain   . Hypertension   . Anemia   . Depression   . Gout   . Restless leg syndrome   . Acid reflux 10/11/2015    Patient Active Problem List   Diagnosis Date Noted  . Acid reflux 10/11/2015  . Adult hypothyroidism 10/11/2015  . ADHD (attention deficit hyperactivity disorder) 10/11/2015  . Bariatric surgery status 10/22/2014  . HLD (hyperlipidemia) 08/18/2013    Past Surgical History  Procedure Laterality Date  . Gastric bypass    . Abdominal hysterectomy    . Foot surgery    . Breast reduction surgery    . Knee arthroscopy Left   . Cholecystectomy    . Tonsillectomy    . Thumb arthroscopy    . Hand arthroplasty Left   . Knee arthroscopy Left 07/19/2015    Procedure: ARTHROSCOPY KNEE;  Surgeon: Leanor Kail, MD;  Location: [facility] ORS;  Service: Orthopedics;  Laterality: Left;  . Back surgery      Current Outpatient Rx  Name  Route  Sig  Dispense  Refill  . atomoxetine (STRATTERA) 40 MG capsule   Oral   Take 2 capsules (80 mg total) by mouth daily. Start with 1 pill daily for one week.   60 capsule   1   . B Complex Vitamins (VITAMIN B COMPLEX PO)   Oral   Take 1 tablet by mouth daily.         . Biotin 7500 MCG TABS   Oral   Take 5,000 mcg  by mouth daily.          . Cholecalciferol (VITAMIN D3) 1000 UNITS CAPS   Oral   Take 1 capsule by mouth daily.          . clindamycin (CLEOCIN) 150 MG capsule   Oral   Take 1 capsule (150 mg total) by mouth 4 (four) times daily.   40 capsule   0   . escitalopram (LEXAPRO) 20 MG tablet   Oral   Take 20 mg by mouth daily.          . fluticasone (FLONASE) 50 MCG/ACT nasal spray   Each Nare   Place 1 spray into both nostrils 2 (two) times daily as needed.          . furosemide (LASIX) 20 MG tablet   Oral   Take 20 mg by mouth daily as needed.          Marland Kitchen ketorolac (TORADOL) 10 MG tablet   Oral   Take 1 tablet (10 mg total) by mouth every 6 (six) hours as needed for moderate pain.   8 tablet   0   . levothyroxine (SYNTHROID, LEVOTHROID) 100 MCG tablet   Oral   Take  100 mcg by mouth daily before breakfast.         . lisinopril (PRINIVIL,ZESTRIL) 20 MG tablet   Oral   Take 20 mg by mouth daily.         Marland Kitchen omega-3 acid ethyl esters (LOVAZA) 1 G capsule   Oral   Take 1 g by mouth 2 (two) times daily.         . pramipexole (MIRAPEX) 0.5 MG tablet   Oral   Take 0.5 mg by mouth at bedtime.            Allergies Nsaids; Penicillins; and Sulfa antibiotics  Family History  Problem Relation Age of Onset  . Cancer Mother     breast  . Heart disease Father   . Diabetes Father   . Hypertension Father   . Cancer Maternal Aunt   . Cancer Maternal Uncle   . Heart disease Paternal Aunt   . Heart disease Paternal Uncle   . Cancer Maternal Grandmother   . Cancer Maternal Grandfather   . Heart disease Paternal Grandmother   . Heart disease Paternal Grandfather     Social History Social History  Substance Use Topics  . Smoking status: Former Smoker -- 0.50 packs/day    Types: Cigarettes    Quit date: 04/14/2015  . Smokeless tobacco: Never Used  . Alcohol Use: No    Review of Systems Constitutional: No fever/chills Eyes: No visual changes. ENT: No  sore throat. Cardiovascular: Denies chest pain. Respiratory: Denies shortness of breath. Gastrointestinal: No abdominal pain.  No nausea, no vomiting.  No diarrhea.  No constipation. Genitourinary: Negative for dysuria. Musculoskeletal: Negative for back pain. Skin: Negative for rash. Neurological: Negative for headaches, focal weakness or numbness. Psychiatric:Depression Endocrine:Hypertension and diabetes Allergic/Immunilogical: See medication list 10-point ROS otherwise negative.  ____________________________________________   PHYSICAL EXAM:  VITAL SIGNS: ED Triage Vitals  Enc Vitals Group     BP 12/23/15 1159 149/81 mmHg     Pulse Rate 12/23/15 1159 95     Resp 12/23/15 1159 16     Temp 12/23/15 1159 98 F (36.7 C)     Temp Source 12/23/15 1159 Oral     SpO2 12/23/15 1159 100 %     Weight 12/23/15 1159 180 lb (81.647 kg)     Height 12/23/15 1159 5\' 4"  (1.626 m)     Head Cir --      Peak Flow --      Pain Score 12/23/15 1159 3     Pain Loc --      Pain Edu? --      Excl. in [place]? --     Constitutional: Alert and oriented. Well appearing and in no acute distress. Eyes: Conjunctivae are normal. PERRL. EOMI. Head: Atraumatic. Nose: No congestion/rhinnorhea. Mouth/Throat: Mucous membranes are moist.  Oropharynx non-erythematous. Neck: No stridor.  No cervical spine tenderness to palpation. Hematological/Lymphatic/Immunilogical: No cervical lymphadenopathy. Cardiovascular: Normal rate, regular rhythm. Grossly normal heart sounds.  Good peripheral circulation. Respiratory: Normal respiratory effort.  No retractions. Lungs CTAB. Gastrointestinal: Soft and nontender. No distention. No abdominal bruits. No CVA tenderness. Musculoskeletal: No lower extremity tenderness nor edema.  No joint effusions. Neurologic:  Normal speech and language. No gross focal neurologic deficits are appreciated. No gait instability. Skin:  Skin is warm, dry and intact. No rash  noted. Psychiatric: Mood and affect are normal. Speech and behavior are normal.  ____________________________________________   LABS (all labs ordered are listed, but only abnormal results are displayed)  Labs Reviewed  BASIC METABOLIC PANEL - Abnormal; Notable for the following:    Glucose, Bld 116 (*)    All other components within normal limits  CBC WITH DIFFERENTIAL/PLATELET - Abnormal; Notable for the following:    Hemoglobin 11.8 (*)    RDW 15.4 (*)    All other components within normal limits   ____________________________________________  EKG   ____________________________________________  RADIOLOGY   ____________________________________________   PROCEDURES  Procedure(s) performed: None  Critical Care performed: No  ____________________________________________   INITIAL IMPRESSION / ASSESSMENT AND PLAN / ED COURSE  Pertinent labs & imaging results that were available during my care of the patient were reviewed by me and considered in my medical decision making (see chart for details).  Chest abscess. Advised patient to continue previous antibiotics. Discussed lab results with patient. Area of concern was outlined with a skin marker patient advised return back if the redness spreads outside of the skin marker. Advised patient to follow-up with family doctor in 3-4 days. ____________________________________________   FINAL CLINICAL IMPRESSION(S) / ED DIAGNOSES  Final diagnoses:  Bacterial infection of skin      Sable Feil, PA-C 12/23/15 [place], MD 12/23/15 1504

## 2015-12-23 NOTE — Discharge Instructions (Signed)
Continue previous medications apply warm compresses to the area for 5 minutes 4 times a day. Follow-up with family doctor in 3-4 days for reevaluation.

## 2015-12-23 NOTE — ED Notes (Signed)
Pt reports last Saturday center of chest became sore and area began to swell until once when touching the area pt reports the area "exploded" and had been draining until Pt reports going to walk in clinic this Saturday and was given doxycycline and meloxicam and told to use warm compressed. Pt reported seeing no improvement since then. Swelling noted to the area and pain is reported upon palpation.

## 2015-12-23 NOTE — Telephone Encounter (Signed)
Pt called stated she needs a prior authorization for Strattera. [facility] [address] [facility]  [address]. Thanks.

## 2015-12-23 NOTE — ED Notes (Signed)
Pt reports abscess between breast x1 week, went to walk in clinic on Saturday and rx doxycycline. Pt reports pain continues, redness worsening. Pt reports abscess isn't draining anymore.

## 2016-01-20 ENCOUNTER — Other Ambulatory Visit: Payer: Self-pay | Admitting: Unknown Physician Specialty

## 2016-05-20 ENCOUNTER — Other Ambulatory Visit: Payer: Self-pay | Admitting: Unknown Physician Specialty

## 2016-05-27 ENCOUNTER — Ambulatory Visit: Payer: BC Managed Care – PPO | Admitting: Podiatry

## 2016-06-22 ENCOUNTER — Ambulatory Visit (INDEPENDENT_AMBULATORY_CARE_PROVIDER_SITE_OTHER): Payer: BC Managed Care – PPO

## 2016-06-22 ENCOUNTER — Encounter: Payer: Self-pay | Admitting: Podiatry

## 2016-06-22 ENCOUNTER — Ambulatory Visit (INDEPENDENT_AMBULATORY_CARE_PROVIDER_SITE_OTHER): Payer: BC Managed Care – PPO | Admitting: Podiatry

## 2016-06-22 DIAGNOSIS — G5762 Lesion of plantar nerve, left lower limb: Secondary | ICD-10-CM

## 2016-06-22 DIAGNOSIS — G588 Other specified mononeuropathies: Secondary | ICD-10-CM

## 2016-06-22 DIAGNOSIS — G5761 Lesion of plantar nerve, right lower limb: Secondary | ICD-10-CM

## 2016-06-22 NOTE — Progress Notes (Signed)
He presents today after having not seen her for quite some time with a chief complaint of pain to the forefoot bilaterally. She states that she has numbness and tingling of the forefoot and has recently been diagnosed with a fracture to the dorsal aspect of the left foot from kicking a table. She is concerned about the thickness and pain that she is experiencing in the forefoot and denies any trauma short of the table incident.  Objective: Vital signs are stable she is alert and oriented 3. Pulses are palpable. Neurologic sensorium is intact. Deep tendon reflexes are intact. She has pain on palpation third interdigital space bilateral. Cutaneous evaluation does not demonstrate any type of open lesions. 3 views of the bilateral foot demonstrates an osseously mature individual with no fractures.  Assessment: Neuroma third interdigital space bilateral.  Plan: We injected Kenalog and local anesthetic bilateral foot. Follow up with her in 3-4 weeks. At which time we may discuss the need for dehydrated alcohol injection.  Dr. Roselind Messier

## 2016-07-20 ENCOUNTER — Ambulatory Visit (INDEPENDENT_AMBULATORY_CARE_PROVIDER_SITE_OTHER): Payer: BC Managed Care – PPO | Admitting: Podiatry

## 2016-07-20 ENCOUNTER — Encounter: Payer: Self-pay | Admitting: Podiatry

## 2016-07-20 ENCOUNTER — Ambulatory Visit (INDEPENDENT_AMBULATORY_CARE_PROVIDER_SITE_OTHER): Payer: BC Managed Care – PPO

## 2016-07-20 DIAGNOSIS — S9031XA Contusion of right foot, initial encounter: Secondary | ICD-10-CM

## 2016-07-20 DIAGNOSIS — S99921A Unspecified injury of right foot, initial encounter: Secondary | ICD-10-CM | POA: Diagnosis not present

## 2016-07-20 DIAGNOSIS — G5762 Lesion of plantar nerve, left lower limb: Secondary | ICD-10-CM

## 2016-07-20 DIAGNOSIS — G5761 Lesion of plantar nerve, right lower limb: Secondary | ICD-10-CM | POA: Diagnosis not present

## 2016-07-20 DIAGNOSIS — D3613 Benign neoplasm of peripheral nerves and autonomic nervous system of lower limb, including hip: Secondary | ICD-10-CM

## 2016-07-21 NOTE — Progress Notes (Signed)
She presents today for follow-up of her neuromas third interdigital space bilaterally. She states that she also injured her right foot yesterday across the top. She states this and she did initially started putting ice on it and swelling has gone down.  Objective: Vital signs are stable she's alert and oriented 3 pulses are palpable. She still has pain on palpation to the third interdigital space with a palpable Mulder's click. She has swelling to the dorsal aspect of the right foot overlying the first metatarsal area. Radiographs taken today do not demonstrate any type of osseus abnormalities in this area of previous bunion surgery second metatarsal osteotomies been performed which appears to have gone onto heal uneventfully. I see no signs of fracture just swelling of the soft tissues.  Assessment: Contusion right foot. Neuroma third interspace bilateral.  Plan: Initiated our first dehydrated alcohol injections today bilaterally no follow-up with her in 3 weeks

## 2016-08-10 ENCOUNTER — Ambulatory Visit (INDEPENDENT_AMBULATORY_CARE_PROVIDER_SITE_OTHER): Payer: BC Managed Care – PPO | Admitting: Podiatry

## 2016-08-10 ENCOUNTER — Encounter: Payer: Self-pay | Admitting: Podiatry

## 2016-08-10 VITALS — BP 125/71 | HR 90 | Ht 64.0 in | Wt 180.0 lb

## 2016-08-10 DIAGNOSIS — G5761 Lesion of plantar nerve, right lower limb: Secondary | ICD-10-CM

## 2016-08-10 DIAGNOSIS — G588 Other specified mononeuropathies: Secondary | ICD-10-CM

## 2016-08-10 DIAGNOSIS — G5762 Lesion of plantar nerve, left lower limb: Secondary | ICD-10-CM

## 2016-08-10 NOTE — Progress Notes (Signed)
She presents today for follow-up of her bilateral Morton's neuroma third interdigital space. She states that she has some improved. 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  Objective: Palpable Mulder's click in interdigital space bilateral. Pulses remain palpable pain.  Assessment: Neuroma slowly getting better third interdigital space bilateral right greater than left.  Plan: Injected her second dose of dehydrated alcohol to the third interspace bilaterally today follow up with her in 1 month.

## 2016-09-07 ENCOUNTER — Encounter: Payer: Self-pay | Admitting: Podiatry

## 2016-09-07 ENCOUNTER — Ambulatory Visit (INDEPENDENT_AMBULATORY_CARE_PROVIDER_SITE_OTHER): Payer: BC Managed Care – PPO | Admitting: Podiatry

## 2016-09-07 DIAGNOSIS — G588 Other specified mononeuropathies: Secondary | ICD-10-CM

## 2016-09-07 DIAGNOSIS — G5762 Lesion of plantar nerve, left lower limb: Secondary | ICD-10-CM

## 2016-09-07 DIAGNOSIS — G5761 Lesion of plantar nerve, right lower limb: Secondary | ICD-10-CM

## 2016-09-07 NOTE — Progress Notes (Signed)
Presents stating greatly improved. Approximately 50% improved.  Objective:  Pulses palp without calf pain.  Pain on palpation of the third interdigital space bilaterally  Assessment:  50% resolution of neuroma symptoms bilatrally  Plan:  reinjected third IDS today bilaterally.  This was the third injection and will rtc in three/four weeks.

## 2016-09-30 ENCOUNTER — Emergency Department: Payer: BC Managed Care – PPO

## 2016-09-30 ENCOUNTER — Encounter: Payer: Self-pay | Admitting: Radiology

## 2016-09-30 ENCOUNTER — Observation Stay
Admission: EM | Admit: 2016-09-30 | Discharge: 2016-10-01 | Disposition: A | Discharge status: Home / Self Care | Payer: BC Managed Care – PPO | Attending: Internal Medicine | Admitting: Internal Medicine

## 2016-09-30 DIAGNOSIS — G2581 Restless legs syndrome: Secondary | ICD-10-CM | POA: Insufficient documentation

## 2016-09-30 DIAGNOSIS — Z803 Family history of malignant neoplasm of breast: Secondary | ICD-10-CM | POA: Insufficient documentation

## 2016-09-30 DIAGNOSIS — K219 Gastro-esophageal reflux disease without esophagitis: Secondary | ICD-10-CM | POA: Insufficient documentation

## 2016-09-30 DIAGNOSIS — E039 Hypothyroidism, unspecified: Secondary | ICD-10-CM | POA: Insufficient documentation

## 2016-09-30 DIAGNOSIS — Z86711 Personal history of pulmonary embolism: Secondary | ICD-10-CM | POA: Insufficient documentation

## 2016-09-30 DIAGNOSIS — R079 Chest pain, unspecified: Secondary | ICD-10-CM | POA: Diagnosis present

## 2016-09-30 DIAGNOSIS — F329 Major depressive disorder, single episode, unspecified: Secondary | ICD-10-CM | POA: Insufficient documentation

## 2016-09-30 DIAGNOSIS — E785 Hyperlipidemia, unspecified: Secondary | ICD-10-CM | POA: Insufficient documentation

## 2016-09-30 DIAGNOSIS — M109 Gout, unspecified: Secondary | ICD-10-CM | POA: Insufficient documentation

## 2016-09-30 DIAGNOSIS — Z87891 Personal history of nicotine dependence: Secondary | ICD-10-CM | POA: Insufficient documentation

## 2016-09-30 DIAGNOSIS — Z882 Allergy status to sulfonamides status: Secondary | ICD-10-CM | POA: Insufficient documentation

## 2016-09-30 DIAGNOSIS — Z9884 Bariatric surgery status: Secondary | ICD-10-CM | POA: Diagnosis not present

## 2016-09-30 DIAGNOSIS — Z880 Allergy status to penicillin: Secondary | ICD-10-CM | POA: Diagnosis not present

## 2016-09-30 DIAGNOSIS — Z8249 Family history of ischemic heart disease and other diseases of the circulatory system: Secondary | ICD-10-CM | POA: Insufficient documentation

## 2016-09-30 DIAGNOSIS — I119 Hypertensive heart disease without heart failure: Secondary | ICD-10-CM | POA: Diagnosis not present

## 2016-09-30 DIAGNOSIS — R599 Enlarged lymph nodes, unspecified: Secondary | ICD-10-CM | POA: Insufficient documentation

## 2016-09-30 DIAGNOSIS — Z886 Allergy status to analgesic agent status: Secondary | ICD-10-CM | POA: Diagnosis not present

## 2016-09-30 DIAGNOSIS — R072 Precordial pain: Secondary | ICD-10-CM | POA: Diagnosis not present

## 2016-09-30 DIAGNOSIS — Z833 Family history of diabetes mellitus: Secondary | ICD-10-CM | POA: Diagnosis not present

## 2016-09-30 HISTORY — DX: Other pulmonary embolism without acute cor pulmonale: I26.99

## 2016-09-30 LAB — CBC WITH DIFFERENTIAL/PLATELET
BASOS PCT: 1 %
Basophils Absolute: 0 10*3/uL (ref 0–0.1)
Eosinophils Absolute: 0.2 10*3/uL (ref 0–0.7)
Eosinophils Relative: 2 %
HCT: 37.7 % (ref 35.0–47.0)
HEMOGLOBIN: 13.2 g/dL (ref 12.0–16.0)
LYMPHS ABS: 2.2 10*3/uL (ref 1.0–3.6)
LYMPHS PCT: 25 %
MCH: 31.8 pg (ref 26.0–34.0)
MCHC: 35 g/dL (ref 32.0–36.0)
MCV: 90.9 fL (ref 80.0–100.0)
MONO ABS: 0.4 10*3/uL (ref 0.2–0.9)
MONOS PCT: 4 %
NEUTROS ABS: 6.1 10*3/uL (ref 1.4–6.5)
NEUTROS PCT: 68 %
Platelets: 277 10*3/uL (ref 150–440)
RBC: 4.15 MIL/uL (ref 3.80–5.20)
RDW: 13 % (ref 11.5–14.5)
WBC: 9 10*3/uL (ref 3.6–11.0)

## 2016-09-30 LAB — TROPONIN I: Troponin I: <0.03 ng/mL (ref <0.03)

## 2016-09-30 LAB — FIBRIN DERIVATIVES D-DIMER (ARMC ONLY): FIBRIN DERIVATIVES D-DIMER (ARMC): 880 — AB (ref 0–499)

## 2016-09-30 LAB — BASIC METABOLIC PANEL
Anion gap: 8 (ref 5–15)
BUN: 15 mg/dL (ref 6–20)
CHLORIDE: 104 mmol/L (ref 101–111)
CO2: 28 mmol/L (ref 22–32)
CREATININE: 0.7 mg/dL (ref 0.44–1.00)
Calcium: 9.2 mg/dL (ref 8.9–10.3)
GFR calc Af Amer: >60 mL/min (ref >60)
GFR calc non Af Amer: >60 mL/min (ref >60)
GLUCOSE: 169 mg/dL — AB (ref 65–99)
Potassium: 4.1 mmol/L (ref 3.5–5.1)
Sodium: 140 mmol/L (ref 135–145)

## 2016-09-30 MED ORDER — ATOMOXETINE HCL 40 MG PO CAPS
80.0000 mg | ORAL_CAPSULE | Freq: Every day | ORAL | Status: DC
  Administered 2016-10-01: 80 mg via ORAL
  Filled 2016-09-30: qty 2

## 2016-09-30 MED ORDER — ONDANSETRON HCL 4 MG/2ML IJ SOLN: 4.0000 mg | Freq: Four times a day (QID) | INTRAMUSCULAR | Status: DC | PRN

## 2016-09-30 MED ORDER — SODIUM CHLORIDE 0.9 % IV BOLUS (SEPSIS)
1000.0000 mL | Freq: Once | INTRAVENOUS | Status: AC
  Administered 2016-09-30: 1000 mL via INTRAVENOUS

## 2016-09-30 MED ORDER — PRAMIPEXOLE DIHYDROCHLORIDE 0.25 MG PO TABS
0.5000 mg | ORAL_TABLET | Freq: Once | ORAL | Status: AC
  Administered 2016-09-30: 0.5 mg via ORAL
  Filled 2016-09-30: qty 2

## 2016-09-30 MED ORDER — ACETAMINOPHEN 325 MG PO TABS
650.0000 mg | ORAL_TABLET | Freq: Four times a day (QID) | ORAL | Status: DC | PRN
  Administered 2016-09-30 – 2016-10-01 (×3): 650 mg via ORAL
  Filled 2016-09-30 (×4): qty 2

## 2016-09-30 MED ORDER — MORPHINE SULFATE (PF) 2 MG/ML IV SOLN: 1.0000 mg | INTRAVENOUS | Status: DC | PRN

## 2016-09-30 MED ORDER — ASPIRIN 81 MG PO CHEW
324.0000 mg | CHEWABLE_TABLET | Freq: Once | ORAL | Status: AC
  Administered 2016-09-30: 324 mg via ORAL
  Filled 2016-09-30: qty 4

## 2016-09-30 MED ORDER — PRAMIPEXOLE DIHYDROCHLORIDE 0.25 MG PO TABS
0.5000 mg | ORAL_TABLET | Freq: Every day | ORAL | Status: DC
  Administered 2016-09-30: 0.5 mg via ORAL
  Filled 2016-09-30: qty 2

## 2016-09-30 MED ORDER — ASPIRIN EC 81 MG PO TBEC
81.0000 mg | DELAYED_RELEASE_TABLET | Freq: Every day | ORAL | Status: DC
  Administered 2016-10-01: 81 mg via ORAL
  Filled 2016-09-30: qty 1

## 2016-09-30 MED ORDER — ENOXAPARIN SODIUM 40 MG/0.4ML ~~LOC~~ SOLN
40.0000 mg | SUBCUTANEOUS | Status: DC
  Administered 2016-09-30: 40 mg via SUBCUTANEOUS
  Filled 2016-09-30: qty 0.4

## 2016-09-30 MED ORDER — METOCLOPRAMIDE HCL 5 MG/ML IJ SOLN
10.0000 mg | Freq: Once | INTRAMUSCULAR | Status: AC
  Administered 2016-09-30: 10 mg via INTRAVENOUS
  Filled 2016-09-30: qty 2

## 2016-09-30 MED ORDER — NITROGLYCERIN 2 % TD OINT
1.0000 [in_us] | TOPICAL_OINTMENT | Freq: Once | TRANSDERMAL | Status: AC
  Administered 2016-09-30: 1 [in_us] via TOPICAL
  Filled 2016-09-30: qty 1

## 2016-09-30 MED ORDER — SODIUM CHLORIDE 0.9% FLUSH
3.0000 mL | Freq: Two times a day (BID) | INTRAVENOUS | Status: DC
  Administered 2016-09-30 – 2016-10-01 (×2): 3 mL via INTRAVENOUS

## 2016-09-30 MED ORDER — BIOTIN 7500 MCG PO TABS: 5000.0000 ug | ORAL_TABLET | Freq: Every day | ORAL | Status: DC

## 2016-09-30 MED ORDER — ONDANSETRON HCL 4 MG PO TABS: 4.0000 mg | ORAL_TABLET | Freq: Four times a day (QID) | ORAL | Status: DC | PRN

## 2016-09-30 MED ORDER — LISINOPRIL 20 MG PO TABS
20.0000 mg | ORAL_TABLET | Freq: Every day | ORAL | Status: DC
  Administered 2016-10-01: 20 mg via ORAL
  Filled 2016-09-30 (×2): qty 1

## 2016-09-30 MED ORDER — LEVOTHYROXINE SODIUM 100 MCG PO TABS
100.0000 ug | ORAL_TABLET | Freq: Every day | ORAL | Status: DC
  Administered 2016-10-01: 100 ug via ORAL
  Filled 2016-09-30: qty 1

## 2016-09-30 MED ORDER — ACETAMINOPHEN 650 MG RE SUPP
650.0000 mg | Freq: Four times a day (QID) | RECTAL | Status: DC | PRN
Start: 2016-09-30 — End: 2016-10-02

## 2016-09-30 MED ORDER — ESCITALOPRAM OXALATE 10 MG PO TABS
20.0000 mg | ORAL_TABLET | Freq: Every day | ORAL | Status: DC
  Administered 2016-10-01: 20 mg via ORAL
  Filled 2016-09-30 (×2): qty 2

## 2016-09-30 MED ORDER — IOPAMIDOL (ISOVUE-370) INJECTION 76%
75.0000 mL | Freq: Once | INTRAVENOUS | Status: AC | PRN
  Administered 2016-09-30: 75 mL via INTRAVENOUS

## 2016-09-30 MED ORDER — NITROGLYCERIN 0.4 MG SL SUBL: 0.4000 mg | SUBLINGUAL_TABLET | SUBLINGUAL | Status: DC | PRN

## 2016-09-30 MED ORDER — NITROGLYCERIN 2 % TD OINT
1.0000 [in_us] | TOPICAL_OINTMENT | Freq: Four times a day (QID) | TRANSDERMAL | Status: DC
  Administered 2016-09-30: 1 [in_us] via TOPICAL
  Filled 2016-09-30: qty 1

## 2016-09-30 NOTE — ED Provider Notes (Signed)
[facility] Emergency Department Provider Note  ____________________________________________  Time seen: Approximately 8:43 AM  I have reviewed the triage vital signs and the nursing notes.   HISTORY  Chief Complaint Chest Pain; Lymphadenopathy; and Headache   HPI Courtney Murphy is a 60 y.o. female a history of hypertension, provoked PEs not on blood thinners, GERD, and depression who presents for evaluation of chest pain. Patient reports that she woke up this morning with a headache and chest pain. She describes her chest pain as a tightness, located in the central part of her chest, radiating to her back and associated with nausea. The pain is currently 6/10. She denies shortness of breath, pleuritic component to the pain, cough, body aches, fever, chills, congestion, sore throat. She does endorse some nausea associated with it. No diaphoresis or paresthesias/ weakness/numbness of UE. She reports never having anything similar. She has no personal history of ischemic heart disease or blood clots, she does have family history in her paternal side of ischemic heart disease, no family history blood clots, no recent travel or immobilization, no leg pain or swelling, no exogenous hormones. Patient does report noting swollen lymph nodes in the right side of her neck for the last few days. She describes her headache as 5/10, throbbing, frontal, constant since this morning. No changes in vision, no slurred speech, no difficulty finding words, no facial droop, no recent tick bites, no neck stiffness, no rash.  Past Medical History:  Diagnosis Date  . Acid reflux 10/11/2015  . Anemia   . Depression   . Gout   . Hypertension   . Knee pain    Left  . Restless leg syndrome   . Shoulder pain     Patient Active Problem List   Diagnosis Date Noted  . Acid reflux 10/11/2015  . Adult hypothyroidism 10/11/2015  . ADHD (attention deficit hyperactivity disorder) 10/11/2015    . Bariatric surgery status 10/22/2014  . HLD (hyperlipidemia) 08/18/2013    Past Surgical History:  Procedure Laterality Date  . ABDOMINAL HYSTERECTOMY    . BACK SURGERY    . BREAST REDUCTION SURGERY    . CHOLECYSTECTOMY    . FOOT SURGERY    . GASTRIC BYPASS    . HAND ARTHROPLASTY Left   . KNEE ARTHROSCOPY Left   . KNEE ARTHROSCOPY Left 07/19/2015   Procedure: ARTHROSCOPY KNEE;  Surgeon: Leanor Kail, MD;  Location: [facility] ORS;  Service: Orthopedics;  Laterality: Left;  . THUMB ARTHROSCOPY    . TONSILLECTOMY      Prior to Admission medications   Medication Sig Start Date End Date Taking? Authorizing Provider  atomoxetine (STRATTERA) 40 MG capsule Take 2 capsules (80 mg total) by mouth daily. Patient taking differently: Take 40 mg by mouth daily.  05/20/16  Yes Kathrine Haddock, NP  B Complex Vitamins (VITAMIN B COMPLEX PO) Take 1 tablet by mouth daily.   Yes Historical Provider, MD  Biotin 7500 MCG TABS Take 5,000 mcg by mouth daily.    Yes Historical Provider, MD  escitalopram (LEXAPRO) 20 MG tablet Take 20 mg by mouth daily.    Yes Historical Provider, MD  levothyroxine (SYNTHROID, LEVOTHROID) 100 MCG tablet Take 100 mcg by mouth daily before breakfast.   Yes Historical Provider, MD  lisinopril (PRINIVIL,ZESTRIL) 20 MG tablet Take 20 mg by mouth daily.   Yes Historical Provider, MD  pramipexole (MIRAPEX) 0.5 MG tablet Take 0.5 mg by mouth at bedtime.    Yes Historical Provider, MD  Allergies Sulfasalazine; Nsaids; Penicillins; and Sulfa antibiotics  Family History  Problem Relation Age of Onset  . Cancer Mother     breast  . Heart disease Father   . Diabetes Father   . Hypertension Father   . Cancer Maternal Grandmother   . Cancer Maternal Grandfather   . Heart disease Paternal Grandmother   . Heart disease Paternal Grandfather   . Cancer Maternal Aunt   . Cancer Maternal Uncle   . Heart disease Paternal Aunt   . Heart disease Paternal Uncle     Social  History Social History  Substance Use Topics  . Smoking status: Former Smoker    Packs/day: 0.50    Types: Cigarettes    Quit date: 04/14/2015  . Smokeless tobacco: Never Used  . Alcohol use No    Review of Systems  Constitutional: Negative for fever. Eyes: Negative for visual changes. ENT: Negative for sore throat. Cardiovascular: + chest pain. Respiratory: Negative for shortness of breath. Gastrointestinal: Negative for abdominal pain, vomiting or diarrhea. + nausea Genitourinary: Negative for dysuria. Musculoskeletal: Negative for back pain. Skin: Negative for rash. Neurological: Negative for weakness or numbness. + HA  ____________________________________________   PHYSICAL EXAM:  VITAL SIGNS: ED Triage Vitals  Enc Vitals Group     BP 09/30/16 0828 127/67     Pulse Rate 09/30/16 0828 84     Resp 09/30/16 0828 18     Temp 09/30/16 0828 98.2 F (36.8 C)     Temp Source 09/30/16 0828 Oral     SpO2 09/30/16 0828 100 %     Weight 09/30/16 0828 180 lb (81.6 kg)     Height 09/30/16 0828 5\' 5"  (1.651 m)     Head Circumference --      Peak Flow --      Pain Score 09/30/16 0829 6     Pain Loc --      Pain Edu? --      Excl. in [place]? --     Constitutional: Alert and oriented. Well appearing and in no apparent distress. HEENT:      Head: Normocephalic and atraumatic.         Eyes: Conjunctivae are normal. Sclera is non-icteric. EOMI. PERRL      Mouth/Throat: Mucous membranes are moist.       Neck: Supple with no signs of meningismus. No cervical lymphadenopathy Cardiovascular: Regular rate and rhythm. No murmurs, gallops, or rubs. 2+ symmetrical distal pulses are present in all extremities. No JVD. Respiratory: Normal respiratory effort. Lungs are clear to auscultation bilaterally. No wheezes, crackles, or rhonchi.  Gastrointestinal: Soft, non tender, and non distended with positive bowel sounds. No rebound or guarding. Genitourinary: No CVA  tenderness. Musculoskeletal: Nontender with normal range of motion in all extremities. No edema, cyanosis, or erythema of extremities. Neurologic: Normal speech and language. A & O x3, PERRL, no nystagmus, CN II-XII intact, motor testing reveals good tone and bulk throughout. There is no evidence of pronator drift or dysmetria. Muscle strength is 5/5 throughout. Deep tendon reflexes are 2+ throughout with downgoing toes. Sensory examination is intact. Gait is normal. Skin: Skin is warm, dry and intact. No rash noted. Psychiatric: Mood and affect are normal. Speech and behavior are normal.  ____________________________________________   LABS (all labs ordered are listed, but only abnormal results are displayed)  Labs Reviewed  BASIC METABOLIC PANEL - Abnormal; Notable for the following:       Result Value   Glucose, Bld 169 (*)  All other components within normal limits  FIBRIN DERIVATIVES D-DIMER ([facility] ONLY) - Abnormal; Notable for the following:    Fibrin derivatives D-dimer ([facility]) 880 (*)    All other components within normal limits  CBC WITH DIFFERENTIAL/PLATELET  TROPONIN I   ____________________________________________  EKG  ED ECG REPORT I, Rudene Re, the attending physician, personally viewed and interpreted this ECG.  Normal sinus rhythm, rate of 84, normal intervals, normal axis, no ST elevations or depressions ____________________________________________  RADIOLOGY  CTA chest: No evidence of pulmonary embolism.  No evidence of acute cardiopulmonary disease. ____________________________________________   PROCEDURES  Procedure(s) performed: None Procedures Critical Care performed:  None ____________________________________________   INITIAL IMPRESSION / ASSESSMENT AND PLAN / ED COURSE  59 y.o. female a history of hypertension, provoked PEs not on blood thinners, GERD, and depression who presents for evaluation of chest pain constant since 3 AM  centrally located associated with nausea and HA. Patient is well appearing, normal VS, neuro intact, PE with no acute findings. EKG with no ischemia. Will apply nitro paste, give full dose ASA, check blood work.   Aortic dissection considered, however there were with no typical symptoms of chest pain radiating through to intrascapular back, no severe hypertension, symmetric bilateral radial pulses, no associated neurologic deficits, no associated abdominal or lower extremity symptoms, no marfanoid features or evidence of underlying connective tissue disorder. Therefore if chest x-ray is without evidence of mediastinal widening, will not pursue further diagnosis at this time.    Clinical Course  Comment By Time  Pain resolved with nitro paste. First troponin negative. D-dimer elevated, CTA chest ordered. Plan to admit for CP rule after CT Rudene Re, MD 11/01 1126  Patient remains chest pain-free with nitroglycerin paste. CT of the chest with no evidence of dissection or PE. I have discussed with the hospitalist for admission. Patient is in agreement. Rudene Re, MD 11/01 1211    Pertinent labs & imaging results that were available during my care of the patient were reviewed by me and considered in my medical decision making (see chart for details).    ____________________________________________   FINAL CLINICAL IMPRESSION(S) / ED DIAGNOSES  Final diagnoses:  Chest pain, unspecified type      NEW MEDICATIONS STARTED DURING THIS VISIT:  New Prescriptions   No medications on file     Note:  This document was prepared using Dragon voice recognition software and may include unintentional dictation errors.    Rudene Re, MD 09/30/16 1212

## 2016-09-30 NOTE — H&P (Signed)
[facility] NAME: Courtney Murphy    MR#:  [id number]  DATE OF BIRTH:  Feb 22, 1956  DATE OF ADMISSION:  09/30/2016  PRIMARY CARE PHYSICIAN: Audelia Acton, MD   REQUESTING/REFERRING PHYSICIAN: Dr. Rudene Re  CHIEF COMPLAINT:   Chief Complaint  Patient presents with  . Chest Pain  . Lymphadenopathy  . Headache    HISTORY OF PRESENT ILLNESS:  Courtney Murphy  is a 60 y.o. female with a known history of Hypertension, depression, gout, restless leg syndrome, osteoarthritis, who presents to the hospital due to substernal chest pain. Patient says she developed some swollen glands on the right side of her neck a few days back and some reflux symptoms. She did not think much of it and was, follow up with her primary care physician, and today she was driving to the beach and developed substernal chest pain/pressure associated with some nausea. Patient's pain was radiating to her back and therefore she drove back home and then came to urgent care at [facility] clinic. The referred her to the hospital for further evaluation. Patient received some Nitropaste and since then her chest pain has now resolved. Hospitalist services were contacted further treatment and evaluation. Patient denied any shortness of breath, the patient's, syncope or any other associated symptoms.  PAST MEDICAL HISTORY:   Past Medical History:  Diagnosis Date  . Acid reflux 10/11/2015  . Anemia   . Depression   . Gout   . Hypertension   . Knee pain    Left  . Restless leg syndrome   . Shoulder pain     PAST SURGICAL HISTORY:   Past Surgical History:  Procedure Laterality Date  . ABDOMINAL HYSTERECTOMY    . BACK SURGERY    . BREAST REDUCTION SURGERY    . CHOLECYSTECTOMY    . FOOT SURGERY    . GASTRIC BYPASS    . HAND ARTHROPLASTY Left   . KNEE ARTHROSCOPY Left   . KNEE ARTHROSCOPY Left 07/19/2015   Procedure: ARTHROSCOPY KNEE;  Surgeon: Leanor Kail,  MD;  Location: [facility] ORS;  Service: Orthopedics;  Laterality: Left;  . THUMB ARTHROSCOPY    . TONSILLECTOMY      SOCIAL HISTORY:   Social History  Substance Use Topics  . Smoking status: Former Smoker    Packs/day: 0.50    Types: Cigarettes    Quit date: 04/14/2015  . Smokeless tobacco: Never Used  . Alcohol use No    FAMILY HISTORY:   Family History  Problem Relation Age of Onset  . Cancer Mother     breast  . Heart disease Father   . Diabetes Father   . Hypertension Father   . Cancer Maternal Grandmother   . Cancer Maternal Grandfather   . Heart disease Paternal Grandmother   . Heart disease Paternal Grandfather   . Cancer Maternal Aunt   . Cancer Maternal Uncle   . Heart disease Paternal Aunt   . Heart disease Paternal Uncle     DRUG ALLERGIES:   Allergies  Allergen Reactions  . Sulfasalazine Hives  . Nsaids   . Penicillins Hives  . Sulfa Antibiotics Hives    REVIEW OF SYSTEMS:   Review of Systems  Constitutional: Negative for fever and weight loss.  HENT: Negative for congestion, nosebleeds and tinnitus.   Eyes: Negative for blurred vision, double vision and redness.  Respiratory: Negative for cough, hemoptysis and shortness of breath.   Cardiovascular: Positive for chest pain. Negative  for orthopnea, leg swelling and PND.  Gastrointestinal: Positive for nausea. Negative for abdominal pain, diarrhea, melena and vomiting.  Genitourinary: Negative for dysuria, hematuria and urgency.  Musculoskeletal: Negative for falls and joint pain.  Neurological: Negative for dizziness, tingling, sensory change, focal weakness, seizures, weakness and headaches.  Endo/Heme/Allergies: Negative for polydipsia. Does not bruise/bleed easily.  Psychiatric/Behavioral: Negative for depression and memory loss. The patient is not nervous/anxious.     MEDICATIONS AT HOME:   Prior to Admission medications   Medication Sig Start Date End Date Taking? Authorizing Provider   atomoxetine (STRATTERA) 40 MG capsule Take 2 capsules (80 mg total) by mouth daily. Patient taking differently: Take 40 mg by mouth daily.  05/20/16  Yes Kathrine Haddock, NP  B Complex Vitamins (VITAMIN B COMPLEX PO) Take 1 tablet by mouth daily.   Yes Historical Provider, MD  Biotin 7500 MCG TABS Take 5,000 mcg by mouth daily.    Yes Historical Provider, MD  escitalopram (LEXAPRO) 20 MG tablet Take 20 mg by mouth daily.    Yes Historical Provider, MD  levothyroxine (SYNTHROID, LEVOTHROID) 100 MCG tablet Take 100 mcg by mouth daily before breakfast.   Yes Historical Provider, MD  lisinopril (PRINIVIL,ZESTRIL) 20 MG tablet Take 20 mg by mouth daily.   Yes Historical Provider, MD  pramipexole (MIRAPEX) 0.5 MG tablet Take 0.5 mg by mouth at bedtime.    Yes Historical Provider, MD      VITAL SIGNS:  Blood pressure (!) 133/92, pulse 93, temperature 98.2 F (36.8 C), temperature source Oral, resp. rate 14, height 5\' 5"  (1.651 m), weight 81.6 kg (180 lb), SpO2 100 %.  PHYSICAL EXAMINATION:  Physical Exam  GENERAL:  60 y.o.-year-old patient lying in the bed in no acute distress.  EYES: Pupils equal, round, reactive to light and accommodation. No scleral icterus. Extraocular muscles intact.  HEENT: Head atraumatic, normocephalic. Oropharynx and nasopharynx clear. No oropharyngeal erythema, moist oral mucosa  NECK:  Supple, no jugular venous distention. No thyroid enlargement, no tenderness.  LUNGS: Normal breath sounds bilaterally, no wheezing, rales, rhonchi. No use of accessory muscles of respiration.  CARDIOVASCULAR: S1, S2 RRR. No murmurs, rubs, gallops, clicks.  ABDOMEN: Soft, nontender, nondistended. Bowel sounds present. No organomegaly or mass.  EXTREMITIES: No pedal edema, cyanosis, or clubbing. + 2 pedal & radial pulses b/l.   NEUROLOGIC: Cranial nerves II through XII are intact. No focal Motor or sensory deficits appreciated b/l PSYCHIATRIC: The patient is alert and oriented x 3. Good  affect.  SKIN: No obvious rash, lesion, or ulcer.   LABORATORY PANEL:   CBC  Recent Labs Lab 09/30/16 0909  WBC 9.0  HGB 13.2  HCT 37.7  PLT 277   ------------------------------------------------------------------------------------------------------------------  Chemistries   Recent Labs Lab 09/30/16 0909  NA 140  K 4.1  CL 104  CO2 28  GLUCOSE 169*  BUN 15  CREATININE 0.70  CALCIUM 9.2   ------------------------------------------------------------------------------------------------------------------  Cardiac Enzymes  Recent Labs Lab 09/30/16 0909  TROPONINI <0.03   ------------------------------------------------------------------------------------------------------------------  RADIOLOGY:  Dg Chest 2 View  Result Date: 09/30/2016 CLINICAL DATA:  Swollen gland. EXAM: CHEST  2 VIEW COMPARISON:  05/20/2003 report . FINDINGS: Mediastinum and hilar structures normal. Mild left upper lobe infiltrate cannot be excluded. No pleural effusion or pneumothorax. Mild cardiomegaly with normal pulmonary vascularity. No acute bony abnormality. Degenerative changes and scoliosis thoracic spine. IMPRESSION: 1. Mild left upper lobe infiltrate cannot be excluded. 2. Mild cardiomegaly.  No pulmonary venous congestion . Electronically Signed  By: Ivey   On: 09/30/2016 09:13   Ct Angio Chest Pe W And/or Wo Contrast  Result Date: 09/30/2016 CLINICAL DATA:  Chest pain, history of PE EXAM: CT ANGIOGRAPHY CHEST WITH CONTRAST TECHNIQUE: Multidetector CT imaging of the chest was performed using the standard protocol during bolus administration of intravenous contrast. Multiplanar CT image reconstructions and MIPs were obtained to evaluate the vascular anatomy. CONTRAST:  75 mL Isovue 370 IV COMPARISON:  Chest radiographs dated 09/30/2016 FINDINGS: Cardiovascular: Satisfactory opacification of the pulmonary arteries to the subsegmental level. No evidence of pulmonary embolism. No  evidence of thoracic aortic aneurysm or dissection. The heart is normal size.  No pericardial effusion. Mediastinum/Nodes: No suspicious mediastinal lymphadenopathy. Visualized thyroid is unremarkable. Lungs/Pleura: Lungs are clear. No focal consolidation. Specifically, the left upper lobe is clear, without opacity to correspond to the possible radiographic abnormality. No suspicious pulmonary nodules. No pleural effusion or pneumothorax. Upper Abdomen: Visualized upper abdomen is notable for cholecystectomy clips and postsurgical changes related to gastric bypass with gastrojejunostomy. Musculoskeletal: Degenerative changes of the visualized thoracolumbar spine. Review of the MIP images confirms the above findings. IMPRESSION: No evidence of pulmonary embolism. No evidence of acute cardiopulmonary disease. Electronically Signed   By: Julian Hy M.D.   On: 09/30/2016 12:02     IMPRESSION AND PLAN:   60 year old female with past medical history of restless leg syndrome, hypertension, gout, depression, GERD, sore throat is a presented to the hospital with substernal chest pain/pressure.  1. Chest pain/pressure-patient does have some risk factors given her family history and the fact that the patient's symptoms resolved with some Nitropaste. -We'll observe on telemetry, cycle her cardiac markers. Get a nuclear medicine stress test in a.m. -Continue aspirin, nitroglycerin, morphine.  2. Hypothyroidism-continue Synthroid.  3. Essential hypertension-continue Lisinopril.   4. Restless leg syndrome-continue pramipexole.  5. Depression-continue Lexapro.   All the records are reviewed and case discussed with ED provider. Management plans discussed with the patient, family and they are in agreement.  CODE STATUS: Full code  TOTAL TIME TAKING CARE OF THIS PATIENT: 45 minutes.    Henreitta Leber M.D on 09/30/2016 at 1:24 PM  Between 7am to 6pm - Pager - [phone]  After 6pm go to  [email or web address] - password [email or web address] [facility]  Office  [phone]  CC: Primary care physician; Audelia Acton, MD

## 2016-09-30 NOTE — ED Notes (Signed)
Dr. Alfred Levins in room to assess patient at this time.  Will continue to monitor.

## 2016-09-30 NOTE — ED Notes (Signed)
Returned from [place]. NAD. Standing as more comfortable with RLS. Agreeable with admission.

## 2016-09-30 NOTE — ED Triage Notes (Signed)
Pt drove herself here to day and ambulated to triage with reports of chest pain this am   Pt reports swollen glands on the right side of her neck yesterday and this am she woke up with a HA  Pt was planning to travel today when her CP began  Pt stating 6/10 chest pain /pressure that radiates to her back upon arrival

## 2016-09-30 NOTE — ED Notes (Signed)
Patient presents to the ED with chest pain/pressure radiating into her back since 5am.  Patient reports swollen area to the right side of her neck since yesterday.  Patient is alert and oriented x 4. Reports feeling slightly nauseous but denies vomiting.  Patient denies history of heart problems or hypertension.

## 2016-09-30 NOTE — ED Notes (Signed)
Pt watching tv. No needs. Remains alert and oriented. Currently pain free. No SHOB.

## 2016-09-30 NOTE — Progress Notes (Signed)
PHARMACIST - PHYSICIAN ORDER COMMUNICATION  CONCERNING: P&T Medication Policy on Herbal Medications  DESCRIPTION:  This patient's order for:  bitoin  has been noted.  This product(s) is classified as an "herbal" or natural product. Due to a lack of definitive safety studies or FDA approval, nonstandard manufacturing practices, plus the potential risk of unknown drug-drug interactions while on inpatient medications, the Pharmacy and Therapeutics Committee does not permit the use of "herbal" or natural products of this type within [facility].   ACTION TAKEN: The pharmacy department is unable to verify this order at this time and your patient has been informed of this safety policy. Please reevaluate patient's clinical condition at discharge and address if the herbal or natural product(s) should be resumed at that time.

## 2016-10-01 ENCOUNTER — Encounter: Payer: Self-pay | Admitting: Radiology

## 2016-10-01 ENCOUNTER — Encounter (HOSPITAL_BASED_OUTPATIENT_CLINIC_OR_DEPARTMENT_OTHER): Payer: BC Managed Care – PPO

## 2016-10-01 DIAGNOSIS — R0789 Other chest pain: Secondary | ICD-10-CM

## 2016-10-01 DIAGNOSIS — R079 Chest pain, unspecified: Secondary | ICD-10-CM

## 2016-10-01 LAB — NM MYOCAR MULTI W/SPECT W/WALL MOTION / EF
CHL CUP NUCLEAR SRS: 1
CHL CUP RESTING HR STRESS: 68 {beats}/min
CHL CUP STRESS STAGE 1 GRADE: 0 %
CHL CUP STRESS STAGE 1 HR: 68 {beats}/min
CHL CUP STRESS STAGE 1 SPEED: 0 mph
CHL CUP STRESS STAGE 2 HR: 68 {beats}/min
CHL CUP STRESS STAGE 3 HR: 91 {beats}/min
CHL CUP STRESS STAGE 4 GRADE: 0 %
CHL CUP STRESS STAGE 4 SPEED: 0 mph
CHL CUP STRESS STAGE 5 DBP: 64 mmHg
CHL CUP STRESS STAGE 5 GRADE: 0 %
CSEPPMHR: 56 %
Estimated workload: 1 METS
LV dias vol: 90 mL (ref 46–106)
LV sys vol: 26 mL
Peak HR: 91 {beats}/min
Percent HR: 61 %
SDS: 1
SSS: 2
Stage 2 Grade: 0 %
Stage 2 Speed: 0 mph
Stage 3 Grade: 0 %
Stage 3 Speed: 0 mph
Stage 4 HR: 96 {beats}/min
Stage 5 HR: 92 {beats}/min
Stage 5 SBP: 129 mmHg
Stage 5 Speed: 0 mph
TID: 0.96

## 2016-10-01 LAB — TROPONIN I: Troponin I: <0.03 ng/mL (ref <0.03)

## 2016-10-01 MED ORDER — TECHNETIUM TC 99M TETROFOSMIN IV KIT
28.6700 | PACK | Freq: Once | INTRAVENOUS | Status: AC | PRN
Start: 2016-10-01 — End: 2016-10-01
  Administered 2016-10-01: 28.67 via INTRAVENOUS

## 2016-10-01 MED ORDER — ATOMOXETINE HCL 40 MG PO CAPS: 40.0000 mg | ORAL_CAPSULE | Freq: Every day | ORAL | 0 refills | Status: DC

## 2016-10-01 MED ORDER — REGADENOSON 0.4 MG/5ML IV SOLN
0.4000 mg | Freq: Once | INTRAVENOUS | Status: AC
  Administered 2016-10-01: 0.4 mg via INTRAVENOUS

## 2016-10-01 MED ORDER — TECHNETIUM TC 99M TETROFOSMIN IV KIT
13.0000 | PACK | Freq: Once | INTRAVENOUS | Status: AC | PRN
  Administered 2016-10-01: 13.323 via INTRAVENOUS

## 2016-10-01 NOTE — Progress Notes (Addendum)
Stress test results normal. IV removed intact with no issues. Telemetry box discontinued.  Pt belongings reviewed. No c/o pain at discharge. Pt discharged with no concerns offered.

## 2016-10-01 NOTE — Progress Notes (Signed)
Discharge still pending the results of the stress test.  Results not yet available.

## 2016-10-01 NOTE — Discharge Summary (Signed)
[facility] NAME: Courtney Murphy    MR#:  [id number]  DATE OF BIRTH:  1956/05/22  DATE OF ADMISSION:  09/30/2016 ADMITTING PHYSICIAN: Henreitta Leber, MD  DATE OF DISCHARGE: 10/20/2016  PRIMARY CARE PHYSICIAN: KRASOVICH,JANELLE, MD    ADMISSION DIAGNOSIS:  Chest pain [R07.9] Chest pain, unspecified type [R07.9]  DISCHARGE DIAGNOSIS:  Active Problems:   Chest pain   SECONDARY DIAGNOSIS:   Past Medical History:  Diagnosis Date  . Acid reflux 10/11/2015  . Anemia   . Depression   . Gout   . Hypertension   . Knee pain    Left  . Pulmonary emboli ([place])   . Restless leg syndrome   . Shoulder pain     HOSPITAL COURSE:   60 year old female with past medical history of restless leg syndrome, hypertension, gout, depression, GERD  is a presented to the hospital with substernal chest pain/pressure.  1. Chest pain/pressure: She was ruled out for ACS. She underwent stress test. She was pain free During hospitalization. Patient's chest x-ray showed possible pneumonia however patient had no symptoms of pneumonia and this was not treated.  Her  Stress test was read as LOW risk.  2. Hypothyroidism-continue Synthroid.  3. Essential hypertension-continue Lisinopril.   4. Restless leg syndrome-continue pramipexole.  5. Depression-continue Lexapro.   DISCHARGE CONDITIONS AND DIET:   Stable regular  CONSULTS OBTAINED:    DRUG ALLERGIES:   Allergies  Allergen Reactions  . Sulfasalazine Hives  . Nsaids   . Penicillins Hives  . Sulfa Antibiotics Hives    DISCHARGE MEDICATIONS:   Current Discharge Medication List    CONTINUE these medications which have CHANGED   Details  atomoxetine (STRATTERA) 40 MG capsule Take 1 capsule (40 mg total) by mouth daily. Qty: 30 capsule, Refills: 0      CONTINUE these medications which have NOT CHANGED   Details  B Complex Vitamins (VITAMIN B COMPLEX PO) Take 1 tablet by  mouth daily.    Biotin 7500 MCG TABS Take 5,000 mcg by mouth daily.     escitalopram (LEXAPRO) 20 MG tablet Take 20 mg by mouth daily.     levothyroxine (SYNTHROID, LEVOTHROID) 100 MCG tablet Take 100 mcg by mouth daily before breakfast.    lisinopril (PRINIVIL,ZESTRIL) 20 MG tablet Take 20 mg by mouth daily.    pramipexole (MIRAPEX) 0.5 MG tablet Take 0.5 mg by mouth at bedtime.               Today   CHIEF COMPLAINT:  No CP overnight   VITAL SIGNS:  Blood pressure (!) 175/84, pulse 74, temperature 98.2 F (36.8 C), temperature source Oral, resp. rate 16, height 5\' 5"  (1.651 m), weight 86 kg (189 lb 8 oz), SpO2 100 %.   REVIEW OF SYSTEMS:  Review of Systems  Constitutional: Negative.  Negative for chills, fever and malaise/fatigue.  HENT: Negative.  Negative for ear discharge, ear pain, hearing loss, nosebleeds and sore throat.   Eyes: Negative.  Negative for blurred vision and pain.  Respiratory: Negative.  Negative for cough, hemoptysis, shortness of breath and wheezing.   Cardiovascular: Negative.  Negative for chest pain, palpitations and leg swelling.  Gastrointestinal: Negative.  Negative for abdominal pain, blood in stool, diarrhea, nausea and vomiting.  Genitourinary: Negative.  Negative for dysuria.  Musculoskeletal: Negative.  Negative for back pain.  Skin: Negative.   Neurological: Negative for dizziness, tremors, speech change, focal weakness, seizures and headaches.  Endo/Heme/Allergies: Negative.  Does not bruise/bleed easily.  Psychiatric/Behavioral: Negative.  Negative for depression, hallucinations and suicidal ideas.     PHYSICAL EXAMINATION:  GENERAL:  60 y.o.-year-old patient lying in the bed with no acute distress.  NECK:  Supple, no jugular venous distention. No thyroid enlargement, no tenderness.  LUNGS: Normal breath sounds bilaterally, no wheezing, rales,rhonchi  No use of accessory muscles of respiration.  CARDIOVASCULAR: S1, S2 normal.  No murmurs, rubs, or gallops.  ABDOMEN: Soft, non-tender, non-distended. Bowel sounds present. No organomegaly or mass.  EXTREMITIES: No pedal edema, cyanosis, or clubbing.  PSYCHIATRIC: The patient is alert and oriented x 3.  SKIN: No obvious rash, lesion, or ulcer.   DATA REVIEW:   CBC  Recent Labs Lab 09/30/16 0909  WBC 9.0  HGB 13.2  HCT 37.7  PLT 277    Chemistries   Recent Labs Lab 09/30/16 0909  NA 140  K 4.1  CL 104  CO2 28  GLUCOSE 169*  BUN 15  CREATININE 0.70  CALCIUM 9.2    Cardiac Enzymes  Recent Labs Lab 09/30/16 1455 09/30/16 1926 09/30/16 2340  TROPONINI <0.03 <0.03 <0.03    Microbiology Results  @[email or web address] @  RADIOLOGY:  Dg Chest 2 View  Result Date: 09/30/2016 CLINICAL DATA:  Swollen gland. EXAM: CHEST  2 VIEW COMPARISON:  05/20/2003 report . FINDINGS: Mediastinum and hilar structures normal. Mild left upper lobe infiltrate cannot be excluded. No pleural effusion or pneumothorax. Mild cardiomegaly with normal pulmonary vascularity. No acute bony abnormality. Degenerative changes and scoliosis thoracic spine. IMPRESSION: 1. Mild left upper lobe infiltrate cannot be excluded. 2. Mild cardiomegaly.  No pulmonary venous congestion . Electronically Signed   By: Marcello Moores  Register   On: 09/30/2016 09:13   Ct Angio Chest Pe W And/or Wo Contrast  Result Date: 09/30/2016 CLINICAL DATA:  Chest pain, history of PE EXAM: CT ANGIOGRAPHY CHEST WITH CONTRAST TECHNIQUE: Multidetector CT imaging of the chest was performed using the standard protocol during bolus administration of intravenous contrast. Multiplanar CT image reconstructions and MIPs were obtained to evaluate the vascular anatomy. CONTRAST:  75 mL Isovue 370 IV COMPARISON:  Chest radiographs dated 09/30/2016 FINDINGS: Cardiovascular: Satisfactory opacification of the pulmonary arteries to the subsegmental level. No evidence of pulmonary embolism. No evidence of thoracic aortic aneurysm or dissection.  The heart is normal size.  No pericardial effusion. Mediastinum/Nodes: No suspicious mediastinal lymphadenopathy. Visualized thyroid is unremarkable. Lungs/Pleura: Lungs are clear. No focal consolidation. Specifically, the left upper lobe is clear, without opacity to correspond to the possible radiographic abnormality. No suspicious pulmonary nodules. No pleural effusion or pneumothorax. Upper Abdomen: Visualized upper abdomen is notable for cholecystectomy clips and postsurgical changes related to gastric bypass with gastrojejunostomy. Musculoskeletal: Degenerative changes of the visualized thoracolumbar spine. Review of the MIP images confirms the above findings. IMPRESSION: No evidence of pulmonary embolism. No evidence of acute cardiopulmonary disease. Electronically Signed   By: Julian Hy M.D.   On: 09/30/2016 12:02      Management plans discussed with the patient and she is in agreement. Stable for discharge home  Patient should follow up with pcp  CODE STATUS:     Code Status Orders        Start     Ordered   09/30/16 1552  Full code  Continuous     09/30/16 1551    Code Status History    Date Active Date Inactive Code Status Order ID Comments User Context   This patient has a current  code status but no historical code status.    Advance Directive Documentation   [place] Most Recent Value  Type of Advance Directive  Living will  Pre-existing out of facility DNR order (yellow form or pink MOST form)  No data  "MOST" Form in Place?  No data      TOTAL TIME TAKING CARE OF THIS PATIENT: 35 minutes.    Note: This dictation was prepared with Dragon dictation along with smaller phrase technology. Any transcriptional errors that result from this process are unintentional.  Evann Koelzer M.D on 10/01/2016 at 11:34 AM  Between 7am to 6pm - Pager - [phone] After 6pm go to [email or web address] - password [email or web address] [facility]  Office   [phone]  CC: Primary care physician; Audelia Acton, MD

## 2016-10-05 ENCOUNTER — Ambulatory Visit (INDEPENDENT_AMBULATORY_CARE_PROVIDER_SITE_OTHER): Payer: BC Managed Care – PPO | Admitting: Podiatry

## 2016-10-05 VITALS — BP 150/81 | HR 100 | Resp 16

## 2016-10-05 DIAGNOSIS — G588 Other specified mononeuropathies: Secondary | ICD-10-CM

## 2016-10-05 DIAGNOSIS — G5761 Lesion of plantar nerve, right lower limb: Secondary | ICD-10-CM | POA: Diagnosis not present

## 2016-10-05 NOTE — Progress Notes (Signed)
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  Objective: Vital signs are stable alert and oriented 3. Pulses are palpable. Neurologic sensorium is intact. She has a palpable Mulder's click third interdigital space bilateral.  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  Plan: I reinjected today dehydrated alcohol to the third digitbilateral. I will follow up with her in 3-4 weeks.

## 2016-10-08 ENCOUNTER — Emergency Department
Admission: EM | Admit: 2016-10-08 | Discharge: 2016-10-08 | Disposition: A | Discharge status: Home / Self Care | Payer: BC Managed Care – PPO | Attending: Emergency Medicine | Admitting: Emergency Medicine

## 2016-10-08 ENCOUNTER — Encounter: Payer: Self-pay | Admitting: Emergency Medicine

## 2016-10-08 ENCOUNTER — Emergency Department: Payer: BC Managed Care – PPO

## 2016-10-08 DIAGNOSIS — R51 Headache: Secondary | ICD-10-CM | POA: Insufficient documentation

## 2016-10-08 DIAGNOSIS — Z79899 Other long term (current) drug therapy: Secondary | ICD-10-CM | POA: Insufficient documentation

## 2016-10-08 DIAGNOSIS — R079 Chest pain, unspecified: Secondary | ICD-10-CM

## 2016-10-08 DIAGNOSIS — M62838 Other muscle spasm: Secondary | ICD-10-CM | POA: Diagnosis not present

## 2016-10-08 DIAGNOSIS — F909 Attention-deficit hyperactivity disorder, unspecified type: Secondary | ICD-10-CM | POA: Diagnosis not present

## 2016-10-08 DIAGNOSIS — G8929 Other chronic pain: Secondary | ICD-10-CM | POA: Diagnosis not present

## 2016-10-08 DIAGNOSIS — E039 Hypothyroidism, unspecified: Secondary | ICD-10-CM | POA: Diagnosis not present

## 2016-10-08 DIAGNOSIS — Z87891 Personal history of nicotine dependence: Secondary | ICD-10-CM | POA: Insufficient documentation

## 2016-10-08 DIAGNOSIS — I10 Essential (primary) hypertension: Secondary | ICD-10-CM | POA: Insufficient documentation

## 2016-10-08 DIAGNOSIS — R0789 Other chest pain: Secondary | ICD-10-CM | POA: Insufficient documentation

## 2016-10-08 DIAGNOSIS — R519 Headache, unspecified: Secondary | ICD-10-CM

## 2016-10-08 LAB — BASIC METABOLIC PANEL
Anion gap: 8 (ref 5–15)
BUN: 13 mg/dL (ref 6–20)
CALCIUM: 9.2 mg/dL (ref 8.9–10.3)
CO2: 28 mmol/L (ref 22–32)
CREATININE: 0.73 mg/dL (ref 0.44–1.00)
Chloride: 103 mmol/L (ref 101–111)
Glucose, Bld: 248 mg/dL — ABNORMAL HIGH (ref 65–99)
Potassium: 3.7 mmol/L (ref 3.5–5.1)
SODIUM: 139 mmol/L (ref 135–145)

## 2016-10-08 LAB — CBC
HCT: 39.7 % (ref 35.0–47.0)
Hemoglobin: 13.8 g/dL (ref 12.0–16.0)
MCH: 31.5 pg (ref 26.0–34.0)
MCHC: 34.8 g/dL (ref 32.0–36.0)
MCV: 90.3 fL (ref 80.0–100.0)
PLATELETS: 268 10*3/uL (ref 150–440)
RBC: 4.4 MIL/uL (ref 3.80–5.20)
RDW: 13.2 % (ref 11.5–14.5)
WBC: 9.5 10*3/uL (ref 3.6–11.0)

## 2016-10-08 LAB — TROPONIN I

## 2016-10-08 MED ORDER — METOCLOPRAMIDE HCL 5 MG/ML IJ SOLN
10.0000 mg | Freq: Once | INTRAMUSCULAR | Status: AC
  Administered 2016-10-08: 10 mg via INTRAVENOUS
  Filled 2016-10-08: qty 2

## 2016-10-08 MED ORDER — CYCLOBENZAPRINE HCL 10 MG PO TABS
10.0000 mg | ORAL_TABLET | Freq: Once | ORAL | Status: AC
  Administered 2016-10-08: 10 mg via ORAL
  Filled 2016-10-08: qty 1

## 2016-10-08 MED ORDER — DIPHENHYDRAMINE HCL 50 MG/ML IJ SOLN
25.0000 mg | Freq: Once | INTRAMUSCULAR | Status: AC
  Administered 2016-10-08: 25 mg via INTRAVENOUS
  Filled 2016-10-08: qty 1

## 2016-10-08 MED ORDER — IOPAMIDOL (ISOVUE-370) INJECTION 76%
75.0000 mL | Freq: Once | INTRAVENOUS | Status: AC | PRN
  Administered 2016-10-08: 75 mL via INTRAVENOUS

## 2016-10-08 MED ORDER — SODIUM CHLORIDE 0.9 % IV BOLUS (SEPSIS)
1000.0000 mL | Freq: Once | INTRAVENOUS | Status: AC
  Administered 2016-10-08: 1000 mL via INTRAVENOUS

## 2016-10-08 NOTE — ED Notes (Signed)
Patient moving around in bed. Unable to obtain an accurate BP at this time.

## 2016-10-08 NOTE — Discharge Instructions (Signed)
You were evaluated for headache and chest discomfort and although no certain cause was found, and your exam and evaluation are reassuring in the emergency room today.  As we discussed, please return to the emergency department immediately for any worsening condition specifically any worsening headache, confusion or altered mental status, dizziness or passing out, weakness or numbness slurred speech, worsening chest pain, certainly any vomiting blood, or any other symptoms concerning to you.

## 2016-10-08 NOTE — ED Provider Notes (Signed)
[facility] Emergency Department Provider Note ____________________________________________   I have reviewed the triage vital signs and the triage nursing note.  HISTORY  Chief Complaint Chest Pain   Historian Patient  HPI Courtney Murphy is a 60 y.o. female here for evaluation of headache. She states the headache has been intermittent and gradual onset since last week when she was in the emergency department and in the hospital evaluated for chest discomfort. She initially thought it was probably due to nitroglycerin use. Since then she's been at home and has had a recurrence of headaches, unknown triggers. This headache has lasted about 2 days now. She is some fairly chronic right shoulder pain which is being followed and treated with cortisone injections. She is questioning whether or not muscular problems could be giving her headaches. She denies any slurred speech, confusion or altered mental status, dizziness or passing out.  Denies weakness or numbness. She states that over a week ago she had some right ear burning and enlarged lymph nodes, but since then that has resolved and she is not really having sinus congestion nor sore throat or coughing or fevers.  No history of frequent headaches.  She states that she has had some return of chest discomfort which is slightly different from what she was evaluated for in the ED and the hospital with a negative chest CT and negative stress test. The chest discomfort is somewhat burning and pressure in nature. No trouble breathing.  Headache is moderate.    Past Medical History:  Diagnosis Date  . Acid reflux 10/11/2015  . Anemia   . Depression   . Gout   . Hypertension   . Knee pain    Left  . Pulmonary emboli ([place])   . Restless leg syndrome   . Shoulder pain     Patient Active Problem List   Diagnosis Date Noted  . Chest pain 09/30/2016  . Acid reflux 10/11/2015  . Adult hypothyroidism 10/11/2015  .  ADHD (attention deficit hyperactivity disorder) 10/11/2015  . Bariatric surgery status 10/22/2014  . HLD (hyperlipidemia) 08/18/2013    Past Surgical History:  Procedure Laterality Date  . ABDOMINAL HYSTERECTOMY    . BACK SURGERY    . BREAST REDUCTION SURGERY    . CESAREAN SECTION  1990  . CHOLECYSTECTOMY    . FOOT SURGERY    . GASTRIC BYPASS    . HAND ARTHROPLASTY Left   . KNEE ARTHROSCOPY Left   . KNEE ARTHROSCOPY Left 07/19/2015   Procedure: ARTHROSCOPY KNEE;  Surgeon: Leanor Kail, MD;  Location: [facility] ORS;  Service: Orthopedics;  Laterality: Left;  . THUMB ARTHROSCOPY    . TONSILLECTOMY      Prior to Admission medications   Medication Sig Start Date End Date Taking? Authorizing Provider  atomoxetine (STRATTERA) 40 MG capsule Take 1 capsule (40 mg total) by mouth daily. 10/01/16   Bettey Costa, MD  B Complex Vitamins (VITAMIN B COMPLEX PO) Take 1 tablet by mouth daily.    Historical Provider, MD  Biotin 7500 MCG TABS Take 5,000 mcg by mouth daily.     Historical Provider, MD  escitalopram (LEXAPRO) 20 MG tablet Take 20 mg by mouth daily.     Historical Provider, MD  levothyroxine (SYNTHROID, LEVOTHROID) 100 MCG tablet Take 100 mcg by mouth daily before breakfast.    Historical Provider, MD  lisinopril (PRINIVIL,ZESTRIL) 20 MG tablet Take 20 mg by mouth daily.    Historical Provider, MD  pramipexole (MIRAPEX) 0.5 MG  tablet Take 0.5 mg by mouth at bedtime.     Historical Provider, MD    Allergies  Allergen Reactions  . Sulfasalazine Hives  . Nsaids   . Penicillins Hives  . Sulfa Antibiotics Hives    Family History  Problem Relation Age of Onset  . Cancer Mother     breast  . Heart disease Father   . Diabetes Father   . Hypertension Father   . Cancer Maternal Grandmother   . Cancer Maternal Grandfather   . Heart disease Paternal Grandmother   . Heart disease Paternal Grandfather   . Cancer Maternal Aunt   . Cancer Maternal Uncle   . Heart disease Paternal Aunt    . Heart disease Paternal Uncle     Social History Social History  Substance Use Topics  . Smoking status: Former Smoker    Packs/day: 0.50    Types: Cigarettes    Quit date: 04/14/2015  . Smokeless tobacco: Never Used  . Alcohol use No    Review of Systems  Constitutional: Negative for fever. Eyes: Negative for visual changes. ENT: Negative for sore throat. Cardiovascular: Negative for Pleuritic chest pain. Respiratory: Negative for shortness of breath. Gastrointestinal: Negative for abdominal pain, vomiting and diarrhea. Genitourinary: Negative for dysuria. Musculoskeletal: Negative for back pain. She does have some right shoulder pain and points to the top of her shoulder between the shoulder and the neck. Skin: Negative for rash. Neurological: Positive for headache. 10 point Review of Systems otherwise negative ____________________________________________   PHYSICAL EXAM:  VITAL SIGNS: ED Triage Vitals [10/08/16 0816]  Enc Vitals Group     BP (!) 159/87     Pulse Rate 86     Resp 18     Temp 98.7 F (37.1 C)     Temp Source Oral     SpO2 98 %     Weight 189 lb (85.7 kg)     Height 5\' 5"  (1.651 m)     Head Circumference      Peak Flow      Pain Score      Pain Loc      Pain Edu?      Excl. in [place]?      Constitutional: Alert and oriented. Well appearing and in no distress. HEENT   Head: Normocephalic and atraumatic.      Eyes: Conjunctivae are normal. PERRL. Normal extraocular movements.      Ears:         Nose: No congestion/rhinnorhea.   Mouth/Throat: Mucous membranes are moist.   Neck: No stridor.  No C-spine tenderness to palpation. She does have some tenderness of the trapezius along the right side of the neck. Cardiovascular/Chest: Normal rate, regular rhythm.  No murmurs, rubs, or gallops. Respiratory: Normal respiratory effort without tachypnea nor retractions. Breath sounds are clear and equal bilaterally. No  wheezes/rales/rhonchi. Gastrointestinal: Soft. No distention, no guarding, no rebound. Nontender.    Genitourinary/rectal:Deferred Musculoskeletal: Palpable and tender muscle spasm of the trapezius over the right shoulder to neck area.  No joint effusions.  No lower extremity tenderness.  No edema. Neurologic:  Normal speech and language. No gross or focal neurologic deficits are appreciated. Skin:  Skin is warm, dry and intact. No rash noted. Psychiatric: Mood and affect are normal. Speech and behavior are normal. Patient exhibits appropriate insight and judgment.   ____________________________________________  LABS (pertinent positives/negatives)  Labs Reviewed  BASIC METABOLIC PANEL - Abnormal; Notable for the following:  Result Value   Glucose, Bld 248 (*)    All other components within normal limits  CBC  TROPONIN I    ____________________________________________    EKG I, Lisa Roca, MD, the attending physician have personally viewed and interpreted all ECGs.  85 bpm. Normal sinus rhythm. Narrow QRS. Normal axis. Normal ST and T-wave ____________________________________________  RADIOLOGY All Xrays were viewed by me. Imaging interpreted by Radiologist.  Chest 2 view:  IMPRESSION: 1.  No radiographic evidence of acute cardiopulmonary disease. 2. Atherosclerosis.  CT head without contrast and CT angiogram head and neck:  IMPRESSION: Normal head CT.  Negative intracranial CT angiography. No abnormality seen to explain pain. __________________________________________  PROCEDURES  Procedure(s) performed: None  Critical Care performed: None  ____________________________________________   ED COURSE / ASSESSMENT AND PLAN  Pertinent labs & imaging results that were available during my care of the patient were reviewed by me and considered in my medical decision making (see chart for details).   Ms. Lace presented for about 1 week of headache and some  chest discomfort over the last 24 hours. No high risk features to her headache, but given that she's never had headaches like this before, we discussed the risk and benefit of imaging and chose to proceed with noncontrast head CT. When she described some ongoing right shoulder pain into the right neck, now associated with a headache, I did evaluate with contrast head and neck imaging.  Ultimately these tests are reassuring and negative. Patient has some feeling that perhaps the headaches are brought on by the neck discomfort and she does have palpable muscle spasm in the trapezius and this is tension type headache. After symptomatic pain medication, she did feel much improved. Next the next In terms of the chest discomfort, symptoms were much milder than when she was evaluated recently with a negative stress test. Unclear etiology, but reassuring exam and evaluation.  When I went to discuss her head CT report with her at about 1220, she had removed her own IV and was removing her monitor stating that she has bad restless legs and wants to just get out here. Ultimately, I think it is okay for discharging her home now. She's feeling improved. We discussed follow-up with primary care physician.    CONSULTATIONS:  None  Patient / Family / Caregiver informed of clinical course, medical decision-making process, and agree with plan.   I discussed return precautions, follow-up instructions, and discharge instructions with patient and/or family.   ___________________________________________   FINAL CLINICAL IMPRESSION(S) / ED DIAGNOSES   Final diagnoses:  Nonspecific chest pain  Acute nonintractable headache, unspecified headache type              Note: This dictation was prepared with Dragon dictation. Any transcriptional errors that result from this process are unintentional    Lisa Roca, MD 10/08/16 1232

## 2016-10-08 NOTE — ED Triage Notes (Signed)
Headache/lower neck x2 weeks , chest x1 week, was seen last week and had complete chest pain workup, ( stress test, CT chest) . " I feel like shit",

## 2016-10-08 NOTE — ED Notes (Signed)
Pt a/o, perrl, vss. Pt in moderate distress c/o HA, CP, and neck pain. Denies injury. Frustrated that she is here again. Lungs sound clear, +bs.

## 2016-10-08 NOTE — ED Notes (Signed)
Pt denies chest pain

## 2016-11-02 ENCOUNTER — Ambulatory Visit: Payer: BC Managed Care – PPO | Admitting: Podiatry

## 2017-03-07 ENCOUNTER — Emergency Department
Admission: EM | Admit: 2017-03-07 | Discharge: 2017-03-07 | Disposition: A | Discharge status: Home / Self Care | Payer: BC Managed Care – PPO | Attending: Emergency Medicine | Admitting: Emergency Medicine

## 2017-03-07 ENCOUNTER — Encounter: Payer: Self-pay | Admitting: Emergency Medicine

## 2017-03-07 ENCOUNTER — Emergency Department: Payer: BC Managed Care – PPO

## 2017-03-07 DIAGNOSIS — Y9389 Activity, other specified: Secondary | ICD-10-CM | POA: Diagnosis not present

## 2017-03-07 DIAGNOSIS — S93491A Sprain of other ligament of right ankle, initial encounter: Secondary | ICD-10-CM | POA: Diagnosis not present

## 2017-03-07 DIAGNOSIS — Y999 Unspecified external cause status: Secondary | ICD-10-CM | POA: Diagnosis not present

## 2017-03-07 DIAGNOSIS — F909 Attention-deficit hyperactivity disorder, unspecified type: Secondary | ICD-10-CM | POA: Diagnosis not present

## 2017-03-07 DIAGNOSIS — Z79899 Other long term (current) drug therapy: Secondary | ICD-10-CM | POA: Diagnosis not present

## 2017-03-07 DIAGNOSIS — I10 Essential (primary) hypertension: Secondary | ICD-10-CM | POA: Insufficient documentation

## 2017-03-07 DIAGNOSIS — X501XXA Overexertion from prolonged static or awkward postures, initial encounter: Secondary | ICD-10-CM | POA: Diagnosis not present

## 2017-03-07 DIAGNOSIS — E039 Hypothyroidism, unspecified: Secondary | ICD-10-CM | POA: Diagnosis not present

## 2017-03-07 DIAGNOSIS — Y929 Unspecified place or not applicable: Secondary | ICD-10-CM | POA: Diagnosis not present

## 2017-03-07 DIAGNOSIS — Z87891 Personal history of nicotine dependence: Secondary | ICD-10-CM | POA: Insufficient documentation

## 2017-03-07 DIAGNOSIS — S99911A Unspecified injury of right ankle, initial encounter: Secondary | ICD-10-CM | POA: Diagnosis present

## 2017-03-07 DIAGNOSIS — M25571 Pain in right ankle and joints of right foot: Secondary | ICD-10-CM

## 2017-03-07 NOTE — ED Triage Notes (Signed)
Patient presents to the ED with right ankle pain x 1 week.  Patient states she was on a trip in [place] last week, walked a lot, and tripped on the side of the sidewalk and rolled her ankle at one point.  Patient reports pain with walking.

## 2017-03-07 NOTE — Discharge Instructions (Signed)
Please purchase over-the-counter ASO brace or a lace up ankle brace. Rest ice and elevate the ankle. Take ibuprofen as needed for pain and swelling. Follow-up with orthopedics or your podiatrist in 5-6 days if no improvement.

## 2017-03-07 NOTE — ED Notes (Signed)
Pt reports stepping off a step wrong last week, states she has been using and taking ibuprofen but continues to have pain and swelling. Pain worse with ambulation.  Swelling noted on right lateral foot around ankle.

## 2017-03-07 NOTE — ED Notes (Signed)
Ace wrap completed by PA.

## 2017-03-07 NOTE — ED Provider Notes (Signed)
[facility] Provider Note   CSN: [id number] Arrival date & time: 03/07/17  1054     History   Chief Complaint Chief Complaint  Patient presents with  . Ankle Pain    HPI Courtney Murphy is a 61 y.o. female presents to the emergency department for evaluation of right ankle pain. She points to the ATFL ligament, has had pain for 1 week. States that she did chew on the side of a sidewalk and rolled her ankle one week ago in [place]. She denies any other pain or injury to her body. Patient states over the last week she's had increased pain and swelling to the ankle. She's been able to ambulate over the last week but has had pain that is increasing pain today. No leg weakness. No warmth erythema throughout the leg. No history of DVT. No chest pain or shortness of breath.   HPI  Past Medical History:  Diagnosis Date  . Acid reflux 10/11/2015  . Anemia   . Depression   . Gout   . Hypertension   . Knee pain    Left  . Pulmonary emboli ([place])   . Restless leg syndrome   . Shoulder pain     Patient Active Problem List   Diagnosis Date Noted  . Chest pain 09/30/2016  . Acid reflux 10/11/2015  . Adult hypothyroidism 10/11/2015  . ADHD (attention deficit hyperactivity disorder) 10/11/2015  . Bariatric surgery status 10/22/2014  . HLD (hyperlipidemia) 08/18/2013    Past Surgical History:  Procedure Laterality Date  . ABDOMINAL HYSTERECTOMY    . BACK SURGERY    . BREAST REDUCTION SURGERY    . CESAREAN SECTION  1990  . CHOLECYSTECTOMY    . FOOT SURGERY    . GASTRIC BYPASS    . HAND ARTHROPLASTY Left   . KNEE ARTHROSCOPY Left   . KNEE ARTHROSCOPY Left 07/19/2015   Procedure: ARTHROSCOPY KNEE;  Surgeon: Leanor Kail, MD;  Location: [facility] ORS;  Service: Orthopedics;  Laterality: Left;  . THUMB ARTHROSCOPY    . TONSILLECTOMY      OB History    No data available       Home Medications    Prior to Admission medications   Medication Sig Start Date End  Date Taking? Authorizing Provider  atomoxetine (STRATTERA) 40 MG capsule Take 1 capsule (40 mg total) by mouth daily. 10/01/16   Bettey Costa, MD  B Complex Vitamins (VITAMIN B COMPLEX PO) Take 1 tablet by mouth daily.    Historical Provider, MD  Biotin 7500 MCG TABS Take 5,000 mcg by mouth daily.     Historical Provider, MD  escitalopram (LEXAPRO) 20 MG tablet Take 20 mg by mouth daily.     Historical Provider, MD  levothyroxine (SYNTHROID, LEVOTHROID) 100 MCG tablet Take 100 mcg by mouth daily before breakfast.    Historical Provider, MD  lisinopril (PRINIVIL,ZESTRIL) 20 MG tablet Take 20 mg by mouth daily.    Historical Provider, MD  pramipexole (MIRAPEX) 0.5 MG tablet Take 0.5 mg by mouth at bedtime.     Historical Provider, MD    Family History Family History  Problem Relation Age of Onset  . Cancer Mother     breast  . Heart disease Father   . Diabetes Father   . Hypertension Father   . Cancer Maternal Grandmother   . Cancer Maternal Grandfather   . Heart disease Paternal Grandmother   . Heart disease Paternal Grandfather   . Cancer Maternal Aunt   .  Cancer Maternal Uncle   . Heart disease Paternal Aunt   . Heart disease Paternal Uncle     Social History Social History  Substance Use Topics  . Smoking status: Former Smoker    Packs/day: 0.50    Types: Cigarettes    Quit date: 04/14/2015  . Smokeless tobacco: Never Used  . Alcohol use No     Allergies   Sulfasalazine; Nsaids; Penicillins; and Sulfa antibiotics   Review of Systems Review of Systems  Constitutional: Negative for activity change, chills, fatigue and fever.  HENT: Negative for congestion, sinus pressure and sore throat.   Eyes: Negative for visual disturbance.  Respiratory: Negative for cough, chest tightness and shortness of breath.   Cardiovascular: Negative for chest pain and leg swelling.  Gastrointestinal: Negative for abdominal pain, diarrhea, nausea and vomiting.  Genitourinary: Negative for  dysuria.  Musculoskeletal: Positive for arthralgias and joint swelling. Negative for gait problem and myalgias.  Skin: Negative for rash.  Neurological: Negative for weakness, numbness and headaches.  Hematological: Negative for adenopathy.  Psychiatric/Behavioral: Negative for agitation, behavioral problems and confusion.     Physical Exam Updated Vital Signs BP (!) 164/108 (BP Location: Left Arm)   Pulse 96   Temp 98.2 F (36.8 C) (Oral)   Resp 18   Ht 5\' 4"  (1.626 m)   Wt 81.6 kg   SpO2 100%   BMI 30.90 kg/m   Physical Exam  Constitutional: She is oriented to person, place, and time. She appears well-developed and well-nourished. No distress.  HENT:  Head: Normocephalic and atraumatic.  Mouth/Throat: Oropharynx is clear and moist.  Eyes: EOM are normal. Pupils are equal, round, and reactive to light. Right eye exhibits no discharge. Left eye exhibits no discharge.  Neck: Normal range of motion. Neck supple.  Cardiovascular: Normal rate, regular rhythm and intact distal pulses.   Pulmonary/Chest: Effort normal and breath sounds normal. No respiratory distress. She exhibits no tenderness.  No coughing.  Abdominal: Soft. She exhibits no distension. There is no tenderness.  Musculoskeletal:  Examination of the right lower extremity shows the patient has no swelling warmth erythema or edema. She is nontender throughout the calf muscle. She has swelling over the lateral aspect of the ankle just distal to the distal fibula. She is moderately tender to palpation along the ATFL ligament with moderate swelling noted. She has painful stress test of the ATFL ligament. No significant ligamentous instability. Achilles tendon is intact. 2+ dorsalis pedis pulse.  Neurological: She is alert and oriented to person, place, and time. She has normal reflexes.  Skin: Skin is warm and dry.  Psychiatric: She has a normal mood and affect. Her behavior is normal. Thought content normal.     ED  Treatments / Results  Labs (all labs ordered are listed, but only abnormal results are displayed) Labs Reviewed - No data to display  EKG  EKG Interpretation None       Radiology Dg Ankle Complete Right  Result Date: 03/07/2017 CLINICAL DATA:  Injured ankle last week.  Persistent pain. EXAM: RIGHT ANKLE - COMPLETE 3+ VIEW COMPARISON:  None. FINDINGS: The ankle mortise is maintained. No ankle fracture or osteochondral abnormality. No definite ankle joint effusion. The mid and hindfoot bony structures are intact. IMPRESSION: No acute fracture. Electronically Signed   By: Marijo Sanes M.D.   On: 03/07/2017 11:32    Procedures Procedures (including critical care time) SPLINT APPLICATION Date/Time: 37:90 PM Authorized by: Feliberto Gottron Consent: Verbal consent obtained. Risks  and benefits: risks, benefits and alternatives were discussed Consent given by: patient Splint applied by: Physician Asst. Location details: Right ankle  Splint type: Ace wrap  Supplies used: 3 inch Ace wrap #1  Post-procedure: The splinted body part was neurovascularly unchanged following the procedure. Patient tolerance: Patient tolerated the procedure well with no immediate complications.    Medications Ordered in ED Medications - No data to display   Initial Impression / Assessment and Plan / ED Course  I have reviewed the triage vital signs and the nursing notes.  Pertinent labs & imaging results that were available during my care of the patient were reviewed by me and considered in my medical decision making (see chart for details).      61 year old female with right lateral ankle sprain. No evidence of acute bony abnormality. No sign of DVT. Physical exam consistent with lateral ankle sprain. She is placed into an Ace wrap today. She is going to purchase ASO lace up ankle brace today. Recommend that she rest ice and elevate. Ibuprofen as needed for pain. Follow-up with her podiatrist  if no improvement in 5-7 days.  Final diagnoses:  Acute right ankle pain  Sprain of anterior talofibular ligament of right ankle, initial encounter    New Prescriptions New Prescriptions   No medications on file     Duanne Guess, PA-C 03/07/17 1229    Lavonia Drafts, MD 03/07/17 1242

## 2017-03-12 ENCOUNTER — Ambulatory Visit (INDEPENDENT_AMBULATORY_CARE_PROVIDER_SITE_OTHER): Payer: BC Managed Care – PPO | Admitting: Podiatry

## 2017-03-12 DIAGNOSIS — R600 Localized edema: Secondary | ICD-10-CM | POA: Diagnosis not present

## 2017-03-12 DIAGNOSIS — IMO0001 Reserved for inherently not codable concepts without codable children: Secondary | ICD-10-CM

## 2017-03-12 DIAGNOSIS — S93401A Sprain of unspecified ligament of right ankle, initial encounter: Secondary | ICD-10-CM | POA: Diagnosis not present

## 2017-03-12 DIAGNOSIS — G5762 Lesion of plantar nerve, left lower limb: Secondary | ICD-10-CM | POA: Diagnosis not present

## 2017-03-14 NOTE — Progress Notes (Signed)
   Subjective:  Patient presents today for pain and tenderness to the right ankle that began 5 days ago secondary to twisting the ankle when she stepped off a curb. She states she was seen at [facility] urgent care. She also complains of a left foot neuroma that is still hurting. Walking and standing increase her ankle pain. She has been wearing an ankle brace, icing the area and taking ibuprofen with no significant relief. Patient presents for further treatment and evaluation.    Objective / Physical Exam:  General:  The patient is alert and oriented x3 in no acute distress. Dermatology:  Skin is warm, dry and supple bilateral lower extremities. Negative for open lesions or macerations. Vascular:  Palpable pedal pulses bilaterally. No edema or erythema noted. Capillary refill within normal limits. Neurological:  Epicritic and protective threshold grossly intact bilaterally.  Musculoskeletal Exam:  Pain on palpation to the anterior lateral medial aspects of the patient's left ankle. Mild edema noted.  Range of motion within normal limits to all pedal and ankle joints bilateral. Muscle strength 5/5 in all groups bilateral.   Radiographic Exam:  Normal osseous mineralization. Joint spaces preserved. No fracture/dislocation/boney destruction.    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 Edrick Kins, [address]                                        [place], [place] [address]                Office [phone]  Fax [phone]

## 2017-03-18 ENCOUNTER — Ambulatory Visit: Payer: BC Managed Care – PPO

## 2017-04-12 ENCOUNTER — Encounter: Payer: Self-pay | Admitting: Podiatry

## 2017-04-12 ENCOUNTER — Ambulatory Visit (INDEPENDENT_AMBULATORY_CARE_PROVIDER_SITE_OTHER): Payer: BC Managed Care – PPO | Admitting: Podiatry

## 2017-04-12 DIAGNOSIS — G5762 Lesion of plantar nerve, left lower limb: Secondary | ICD-10-CM | POA: Diagnosis not present

## 2017-04-12 NOTE — Progress Notes (Signed)
She presents today for follow-up of neuroma to the third interdigital space of the left foot.  Objective: Pulses are palpable she has pain on palpation third interspace left foot.  Plan: Injected neuroma today with Kenalog and local anesthetic. Follow-up with her as needed.

## 2017-04-22 DIAGNOSIS — E119 Type 2 diabetes mellitus without complications: Secondary | ICD-10-CM | POA: Insufficient documentation

## 2017-05-10 ENCOUNTER — Other Ambulatory Visit: Payer: Self-pay | Admitting: Unknown Physician Specialty

## 2017-07-06 ENCOUNTER — Other Ambulatory Visit: Payer: Self-pay | Admitting: Unknown Physician Specialty

## 2017-07-06 NOTE — Telephone Encounter (Signed)
I do not believe this is a current patient here.

## 2017-07-12 ENCOUNTER — Ambulatory Visit (INDEPENDENT_AMBULATORY_CARE_PROVIDER_SITE_OTHER): Payer: BC Managed Care – PPO | Admitting: Podiatry

## 2017-07-12 ENCOUNTER — Ambulatory Visit (INDEPENDENT_AMBULATORY_CARE_PROVIDER_SITE_OTHER): Payer: BC Managed Care – PPO

## 2017-07-12 ENCOUNTER — Encounter: Payer: Self-pay | Admitting: Podiatry

## 2017-07-12 DIAGNOSIS — M778 Other enthesopathies, not elsewhere classified: Secondary | ICD-10-CM

## 2017-07-12 DIAGNOSIS — M779 Enthesopathy, unspecified: Principal | ICD-10-CM

## 2017-07-12 DIAGNOSIS — M7751 Other enthesopathy of right foot: Secondary | ICD-10-CM

## 2017-07-12 NOTE — Progress Notes (Signed)
She presents today with chief complaint of pain to the dorsal aspect of her right foot. She denies any trauma but states that she's been wearing flip-flops high heels and bare feet.  Objective: Vital signs are stable alert and oriented 3. Pulses are palpable. She has pain on end range of motion of the second third and fourth metatarsophalangeal joints radiographs do not demonstrate any type of fractures.  Assessment: Capsulitis second third and fourth metatarsophalangeal joints right foot.  Plan: I injected these areas today with Kenalog and local anesthetic between the second and third intermetatarsal spaces. Discussed appropriate shoe gear stretching exercises anti-inflammatory therapy follow-up with her as needed.

## 2017-07-18 ENCOUNTER — Emergency Department: Payer: BC Managed Care – PPO

## 2017-07-18 ENCOUNTER — Emergency Department
Admission: EM | Admit: 2017-07-18 | Discharge: 2017-07-18 | Disposition: A | Discharge status: Home / Self Care | Payer: BC Managed Care – PPO | Attending: Emergency Medicine | Admitting: Emergency Medicine

## 2017-07-18 DIAGNOSIS — N200 Calculus of kidney: Secondary | ICD-10-CM | POA: Diagnosis not present

## 2017-07-18 DIAGNOSIS — R110 Nausea: Secondary | ICD-10-CM | POA: Insufficient documentation

## 2017-07-18 DIAGNOSIS — Z87891 Personal history of nicotine dependence: Secondary | ICD-10-CM | POA: Diagnosis not present

## 2017-07-18 DIAGNOSIS — E039 Hypothyroidism, unspecified: Secondary | ICD-10-CM | POA: Insufficient documentation

## 2017-07-18 DIAGNOSIS — I10 Essential (primary) hypertension: Secondary | ICD-10-CM | POA: Diagnosis not present

## 2017-07-18 DIAGNOSIS — Z79899 Other long term (current) drug therapy: Secondary | ICD-10-CM | POA: Insufficient documentation

## 2017-07-18 DIAGNOSIS — R1030 Lower abdominal pain, unspecified: Secondary | ICD-10-CM | POA: Insufficient documentation

## 2017-07-18 LAB — URINALYSIS, COMPLETE (UACMP) WITH MICROSCOPIC
BILIRUBIN URINE: NEGATIVE
Bacteria, UA: NONE SEEN
Glucose, UA: 50 mg/dL — AB
Ketones, ur: NEGATIVE mg/dL
Leukocytes, UA: NEGATIVE
Nitrite: NEGATIVE
PROTEIN: NEGATIVE mg/dL
SPECIFIC GRAVITY, URINE: 1.023 (ref 1.005–1.030)
pH: 5 (ref 5.0–8.0)

## 2017-07-18 LAB — COMPREHENSIVE METABOLIC PANEL
ALBUMIN: 4.2 g/dL (ref 3.5–5.0)
ALT: 22 U/L (ref 14–54)
AST: 27 U/L (ref 15–41)
Alkaline Phosphatase: 108 U/L (ref 38–126)
Anion gap: 10 (ref 5–15)
BILIRUBIN TOTAL: 0.3 mg/dL (ref 0.3–1.2)
BUN: 13 mg/dL (ref 6–20)
CALCIUM: 9.3 mg/dL (ref 8.9–10.3)
CO2: 26 mmol/L (ref 22–32)
Chloride: 103 mmol/L (ref 101–111)
Creatinine, Ser: 0.79 mg/dL (ref 0.44–1.00)
GFR calc Af Amer: >60 mL/min (ref >60)
GLUCOSE: 165 mg/dL — AB (ref 65–99)
Potassium: 3.7 mmol/L (ref 3.5–5.1)
SODIUM: 139 mmol/L (ref 135–145)
TOTAL PROTEIN: 7.3 g/dL (ref 6.5–8.1)

## 2017-07-18 LAB — LIPASE, BLOOD: Lipase: 36 U/L (ref 11–51)

## 2017-07-18 LAB — CBC
HCT: 40.2 % (ref 35.0–47.0)
Hemoglobin: 14 g/dL (ref 12.0–16.0)
MCH: 31.7 pg (ref 26.0–34.0)
MCHC: 34.8 g/dL (ref 32.0–36.0)
MCV: 91.2 fL (ref 80.0–100.0)
PLATELETS: 331 10*3/uL (ref 150–440)
RBC: 4.41 MIL/uL (ref 3.80–5.20)
RDW: 13.2 % (ref 11.5–14.5)
WBC: 14.2 10*3/uL — ABNORMAL HIGH (ref 3.6–11.0)

## 2017-07-18 MED ORDER — HYDROCODONE-ACETAMINOPHEN 5-325 MG PO TABS
1.0000 | ORAL_TABLET | Freq: Once | ORAL | Status: AC
  Administered 2017-07-18: 1 via ORAL
  Filled 2017-07-18: qty 1

## 2017-07-18 MED ORDER — MORPHINE SULFATE (PF) 4 MG/ML IV SOLN: 4.0000 mg | Freq: Once | INTRAVENOUS | Status: DC | PRN

## 2017-07-18 MED ORDER — ONDANSETRON 4 MG PO TBDP: 4.0000 mg | ORAL_TABLET | Freq: Three times a day (TID) | ORAL | 0 refills | Status: DC | PRN

## 2017-07-18 MED ORDER — TRAMADOL HCL 50 MG PO TABS: 50.0000 mg | ORAL_TABLET | Freq: Four times a day (QID) | ORAL | 0 refills | Status: DC | PRN

## 2017-07-18 MED ORDER — ONDANSETRON HCL 4 MG/2ML IJ SOLN
4.0000 mg | Freq: Once | INTRAMUSCULAR | Status: AC
  Administered 2017-07-18: 4 mg via INTRAVENOUS
  Filled 2017-07-18: qty 2

## 2017-07-18 MED ORDER — IOPAMIDOL (ISOVUE-300) INJECTION 61%
100.0000 mL | Freq: Once | INTRAVENOUS | Status: AC | PRN
  Administered 2017-07-18: 100 mL via INTRAVENOUS

## 2017-07-18 MED ORDER — MORPHINE SULFATE (PF) 4 MG/ML IV SOLN
4.0000 mg | Freq: Once | INTRAVENOUS | Status: AC
Start: 2017-07-18 — End: 2017-07-18
  Administered 2017-07-18: 4 mg via INTRAVENOUS
  Filled 2017-07-18: qty 1

## 2017-07-18 MED ORDER — SODIUM CHLORIDE 0.9 % IV BOLUS (SEPSIS)
1000.0000 mL | Freq: Once | INTRAVENOUS | Status: AC
  Administered 2017-07-18: 1000 mL via INTRAVENOUS

## 2017-07-18 MED ORDER — IOPAMIDOL (ISOVUE-300) INJECTION 61%
30.0000 mL | Freq: Once | INTRAVENOUS | Status: AC | PRN
  Administered 2017-07-18: 30 mL via ORAL

## 2017-07-18 NOTE — ED Triage Notes (Signed)
Pt presents via POV c/o lower abd pain starting a couple hours PTA. Pt states sitting on couch when started. No aggravating factors. Pt denies denies.

## 2017-07-18 NOTE — ED Notes (Signed)
Pt completed contrast for CT study

## 2017-07-18 NOTE — ED Provider Notes (Signed)
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 Discussed with Dr. Eppie Gibson of urology, recommended outpatient follow-up for this, clinically this to the patient. She is feeling better offered admission but she would like to go home with pain medications. She will return if worsening condition   Lavonia Drafts, MD 07/18/17 1810

## 2017-07-18 NOTE — ED Provider Notes (Signed)
[facility] Emergency Department Provider Note ____________________________________________   I have reviewed the triage vital signs and the triage nursing note.  HISTORY  Chief Complaint Abdominal Pain   Historian Patient  HPI Courtney Murphy is a 61 y.o. female presenting for lower abdominal pain since this morning. Patient is severe suprapubic and bilateral and in the mid abdomen. Patient states that she felt warm but without true fever. Nausea without vomiting. She did have a normal bowel movement. She is a history of gastric bypass many years ago.  Denies urinary frequency, hematuria, or dysuria. States that she has had a kidney stone in the past but it was flank and this pain is not really flank pain nor is it one-sided.  Nothing makes it worse or better.    Past Medical History:  Diagnosis Date  . Acid reflux 10/11/2015  . Anemia   . Depression   . Gout   . Hypertension   . Knee pain    Left  . Pulmonary emboli ([place])   . Restless leg syndrome   . Shoulder pain     Patient Active Problem List   Diagnosis Date Noted  . Chest pain 09/30/2016  . Acid reflux 10/11/2015  . Adult hypothyroidism 10/11/2015  . ADHD (attention deficit hyperactivity disorder) 10/11/2015  . Bariatric surgery status 10/22/2014  . HLD (hyperlipidemia) 08/18/2013    Past Surgical History:  Procedure Laterality Date  . ABDOMINAL HYSTERECTOMY    . BACK SURGERY    . BREAST REDUCTION SURGERY    . CESAREAN SECTION  1990  . CHOLECYSTECTOMY    . FOOT SURGERY    . GASTRIC BYPASS    . HAND ARTHROPLASTY Left   . KNEE ARTHROSCOPY Left   . KNEE ARTHROSCOPY Left 07/19/2015   Procedure: ARTHROSCOPY KNEE;  Surgeon: Leanor Kail, MD;  Location: [facility] ORS;  Service: Orthopedics;  Laterality: Left;  . THUMB ARTHROSCOPY    . TONSILLECTOMY      Prior to Admission medications   Medication Sig Start Date End Date Taking? Authorizing Provider  atomoxetine (STRATTERA) 40 MG  capsule Take 1 capsule (40 mg total) by mouth daily. 10/01/16  Yes Mody, Ulice Bold, MD  atorvastatin (LIPITOR) 20 MG tablet Take 20 mg by mouth daily. 07/06/17  Yes [provider]  B Complex Vitamins (VITAMIN B COMPLEX PO) Take 1 tablet by mouth daily.   Yes [provider]  Biotin 7500 MCG TABS Take 5,000 mcg by mouth daily.    Yes [provider]  CALCIUM-MAGNESIUM-ZINC PO Take 1 tablet by mouth at bedtime.   Yes [provider]  escitalopram (LEXAPRO) 20 MG tablet Take 20 mg by mouth daily.    Yes [provider]  ferrous sulfate 325 (65 FE) MG tablet Take 325 mg by mouth at bedtime.   Yes [provider]  gabapentin (NEURONTIN) 300 MG capsule Take 300 mg by mouth at bedtime. 07/06/17  Yes [provider]  levothyroxine (SYNTHROID, LEVOTHROID) 100 MCG tablet Take 100 mcg by mouth daily before breakfast.   Yes [provider]  lisinopril (PRINIVIL,ZESTRIL) 20 MG tablet Take 20 mg by mouth daily.   Yes [provider]  pramipexole (MIRAPEX) 0.5 MG tablet Take 0.5 mg by mouth 2 (two) times daily.    Yes [provider]  TRULICITY 4.27 CW/2.3JS SOPN Inject 0.75 mg into the skin every Thursday. 07/12/17  Yes [provider]    Allergies  Allergen Reactions  . Sulfasalazine Hives  .  Nsaids   . Sulfa Antibiotics Hives  . Penicillins Hives and Rash    Has patient had a PCN reaction causing immediate rash, facial/tongue/throat swelling, SOB or lightheadedness with hypotension: No Has patient had a PCN reaction causing severe rash involving mucus membranes or skin necrosis: No Has patient had a PCN reaction that required hospitalization: No Has patient had a PCN reaction occurring within the last 10 years: No If all of the above answers are "NO", then may proceed with Cephalosporin use.     Family History  Problem Relation Age of Onset  . Cancer Mother        breast  . Heart disease Father   . Diabetes  Father   . Hypertension Father   . Cancer Maternal Grandmother   . Cancer Maternal Grandfather   . Heart disease Paternal Grandmother   . Heart disease Paternal Grandfather   . Cancer Maternal Aunt   . Cancer Maternal Uncle   . Heart disease Paternal Aunt   . Heart disease Paternal Uncle     Social History Social History  Substance Use Topics  . Smoking status: Former Smoker    Packs/day: 0.50    Types: Cigarettes    Quit date: 04/14/2015  . Smokeless tobacco: Never Used  . Alcohol use No    Review of Systems  Constitutional: Negative for fever. Eyes: Negative for visual changes. ENT: Negative for sore throat. Cardiovascular: Negative for chest pain. Respiratory: Negative for shortness of breath. Gastrointestinal: Abdominal pain as per history of present illness. Genitourinary: Negative for dysuria. Musculoskeletal: Negative for back pain. Skin: Negative for rash. Neurological: Negative for headache.  ____________________________________________   PHYSICAL EXAM:  VITAL SIGNS: ED Triage Vitals  Enc Vitals Group     BP 07/18/17 1301 (!) 145/87     Pulse Rate 07/18/17 1301 71     Resp 07/18/17 1301 14     Temp 07/18/17 1301 98.4 F (36.9 C)     Temp Source 07/18/17 1301 Oral     SpO2 07/18/17 1301 100 %     Weight 07/18/17 1301 200 lb (90.7 kg)     Height 07/18/17 1301 5\' 4"  (1.626 m)     Head Circumference --      Peak Flow --      Pain Score 07/18/17 1300 8     Pain Loc --      Pain Edu? --      Excl. in [place]? --      Constitutional: Alert and oriented. Well appearing and in no distress. HEENT   Head: Normocephalic and atraumatic.      Eyes: Conjunctivae are normal. Pupils equal and round.       Ears:         Nose: No congestion/rhinnorhea.   Mouth/Throat: Mucous membranes are moist.   Neck: No stridor. Cardiovascular/Chest: Normal rate, regular rhythm.  No murmurs, rubs, or gallops. Respiratory: Normal respiratory effort without tachypnea  nor retractions. Breath sounds are clear and equal bilaterally. No wheezes/rales/rhonchi. Gastrointestinal: Soft. No distention, no guarding, no rebound. Moderate mid and lower abdominal tenderness to palpation without focal McBurney's point tenderness.  Genitourinary/rectal:Deferred Musculoskeletal: Nontender with normal range of motion in all extremities. No joint effusions.  No lower extremity tenderness.  No edema. Neurologic:  Normal speech and language. No gross or focal neurologic deficits are appreciated. Skin:  Skin is warm, dry and intact. No rash noted. Psychiatric: Mood and affect are normal. Speech and behavior are normal. Patient exhibits appropriate  insight and judgment.   ____________________________________________  LABS (pertinent positives/negatives)  Labs Reviewed  COMPREHENSIVE METABOLIC PANEL - Abnormal; Notable for the following:       Result Value   Glucose, Bld 165 (*)    All other components within normal limits  CBC - Abnormal; Notable for the following:    WBC 14.2 (*)    All other components within normal limits  URINALYSIS, COMPLETE (UACMP) WITH MICROSCOPIC - Abnormal; Notable for the following:    Color, Urine AMBER (*)    APPearance CLOUDY (*)    Glucose, UA 50 (*)    Hgb urine dipstick LARGE (*)    Squamous Epithelial / LPF 0-5 (*)    All other components within normal limits  LIPASE, BLOOD    ____________________________________________    EKG I, Lisa Roca, MD, the attending physician have personally viewed and interpreted all ECGs.  None ____________________________________________  RADIOLOGY All Xrays were viewed by me. Imaging interpreted by Radiologist.  CT abdomen and pelvis with contrast: Pending __________________________________________  PROCEDURES  Procedure(s) performed: None  Critical Care performed: None  ____________________________________________   ED COURSE / ASSESSMENT AND PLAN  Pertinent labs & imaging  results that were available during my care of the patient were reviewed by me and considered in my medical decision making (see chart for details).   Responses here with mid abdominal cramping down into the lower abdomen. Patient states this is not felt like a kidney stone to her. It's not flank pain or one-sided. Given the elevated white blood cell count and the location of the pain, I discussed risk and benefit of CT scan imaging and chose to proceed. Just proceed with IV contrast ordered to make certain to evaluate for appendicitis and diverticulitis, knowing that a possible missed ureteral stone is possible.  Patient care to be transferred to Dr. Corky Downs at shift change 3 PM. Disposition per CT result.     CONSULTATIONS:   None   Patient / Family / Caregiver informed of clinical course, medical decision-making process, and agree with plan.   ___________________________________________   FINAL CLINICAL IMPRESSION(S) / ED DIAGNOSES   Final diagnoses:  Lower abdominal pain              Note: This dictation was prepared with Dragon dictation. Any transcriptional errors that result from this process are unintentional    Lisa Roca, MD 07/18/17 1456

## 2017-07-22 ENCOUNTER — Encounter: Payer: Self-pay | Admitting: *Deleted

## 2017-07-26 ENCOUNTER — Telehealth: Payer: Self-pay

## 2017-07-26 ENCOUNTER — Encounter: Payer: Self-pay | Admitting: Unknown Physician Specialty

## 2017-07-26 ENCOUNTER — Ambulatory Visit (INDEPENDENT_AMBULATORY_CARE_PROVIDER_SITE_OTHER): Payer: BC Managed Care – PPO | Admitting: Unknown Physician Specialty

## 2017-07-26 DIAGNOSIS — F900 Attention-deficit hyperactivity disorder, predominantly inattentive type: Secondary | ICD-10-CM

## 2017-07-26 DIAGNOSIS — G2581 Restless legs syndrome: Secondary | ICD-10-CM | POA: Insufficient documentation

## 2017-07-26 MED ORDER — ATOMOXETINE HCL 40 MG PO CAPS: 40.0000 mg | ORAL_CAPSULE | Freq: Every day | ORAL | 3 refills | Status: DC

## 2017-07-26 NOTE — Telephone Encounter (Signed)
Straterra prescription failed to send to the pharmacy. Called it in verbally.

## 2017-07-26 NOTE — Assessment & Plan Note (Signed)
Refill Strattera at 40 mg daily

## 2017-07-26 NOTE — Discharge Instructions (Signed)

## 2017-07-26 NOTE — Progress Notes (Signed)
BP 124/86   Pulse 93   Temp 98.7 F (37.1 C)   Ht 5' 4.9" (1.648 m)   Wt 184 lb 8 oz (83.7 kg)   SpO2 98%   BMI 30.80 kg/m    Subjective:    Patient ID: Courtney Murphy, female    DOB: January 06, 1956, 61 y.o.   MRN: [id number]  HPI: Courtney Murphy is a 61 y.o. female  Chief Complaint  Patient presents with  . ADHD    pt states she needs a refill on her Skipper Cliche   Courtney Murphy sees another medical provider for her general medical care.  She sees me only for refill of her Strattera.  States she is doing well with her Strattera.  States she can stay focused on what she is doing. 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  No known side-effects.    Since last seen, new diagnosis of Diabetes type 2 and on Trulicity.    Relevant past medical, surgical, family and social history reviewed and updated as indicated. Interim medical history since our last visit reviewed. Allergies and medications reviewed and updated.  Review of Systems  Per HPI unless specifically indicated above     Objective:    BP 124/86   Pulse 93   Temp 98.7 F (37.1 C)   Ht 5' 4.9" (1.648 m)   Wt 184 lb 8 oz (83.7 kg)   SpO2 98%   BMI 30.80 kg/m   Wt Readings from Last 3 Encounters:  07/26/17 184 lb 8 oz (83.7 kg)  07/18/17 175 lb (79.4 kg)  03/07/17 180 lb (81.6 kg)    Physical Exam  Constitutional: She is oriented to person, place, and time. She appears well-developed and well-nourished. No distress.  HENT:  Head: Normocephalic and atraumatic.  Eyes: Conjunctivae and lids are normal. Right eye exhibits no discharge. Left eye exhibits no discharge. No scleral icterus.  Neck: Normal range of motion. Neck supple. No JVD present. Carotid bruit is not present.  Cardiovascular: Normal rate, regular rhythm and normal heart sounds.   Pulmonary/Chest: Effort normal and breath sounds normal.  Abdominal: Normal appearance. There is no splenomegaly or  hepatomegaly.  Musculoskeletal: Normal range of motion.  Neurological: She is alert and oriented to person, place, and time.  Skin: Skin is warm, dry and intact. No rash noted. No pallor.  Psychiatric: She has a normal mood and affect. Her behavior is normal. Judgment and thought content normal.    Results for orders placed or performed during the hospital encounter of 07/18/17  Lipase, blood  Result Value Ref Range   Lipase 36 11 - 51 U/L  Comprehensive metabolic panel  Result Value Ref Range   Sodium 139 135 - 145 mmol/L   Potassium 3.7 3.5 - 5.1 mmol/L   Chloride 103 101 - 111 mmol/L   CO2 26 22 - 32 mmol/L   Glucose, Bld 165 (H) 65 - 99 mg/dL   BUN 13 6 - 20 mg/dL   Creatinine, Ser 0.79 0.44 - 1.00 mg/dL   Calcium 9.3 8.9 - 10.3 mg/dL   Total Protein 7.3 6.5 - 8.1 g/dL   Albumin 4.2 3.5 - 5.0 g/dL   AST 27 15 - 41 U/L   ALT 22 14 - 54 U/L   Alkaline Phosphatase 108 38 - 126 U/L   Total Bilirubin 0.3 0.3 - 1.2 mg/dL   GFR calc non Af Amer >60 >  60 mL/min   GFR calc Af Amer >60 >60 mL/min   Anion gap 10 5 - 15  CBC  Result Value Ref Range   WBC 14.2 (H) 3.6 - 11.0 K/uL   RBC 4.41 3.80 - 5.20 MIL/uL   Hemoglobin 14.0 12.0 - 16.0 g/dL   HCT 40.2 35.0 - 47.0 %   MCV 91.2 80.0 - 100.0 fL   MCH 31.7 26.0 - 34.0 pg   MCHC 34.8 32.0 - 36.0 g/dL   RDW 13.2 11.5 - 14.5 %   Platelets 331 150 - 440 K/uL  Urinalysis, Complete w Microscopic  Result Value Ref Range   Color, Urine AMBER (A) YELLOW   APPearance CLOUDY (A) CLEAR   Specific Gravity, Urine 1.023 1.005 - 1.030   pH 5.0 5.0 - 8.0   Glucose, UA 50 (A) NEGATIVE mg/dL   Hgb urine dipstick LARGE (A) NEGATIVE   Bilirubin Urine NEGATIVE NEGATIVE   Ketones, ur NEGATIVE NEGATIVE mg/dL   Protein, ur NEGATIVE NEGATIVE mg/dL   Nitrite NEGATIVE NEGATIVE   Leukocytes, UA NEGATIVE NEGATIVE   RBC / HPF TOO NUMEROUS TO COUNT 0 - 5 RBC/hpf   WBC, UA 0-5 0 - 5 WBC/hpf   Bacteria, UA NONE SEEN NONE SEEN   Squamous Epithelial / LPF  0-5 (A) NONE SEEN   Mucus PRESENT    Amorphous Crystal PRESENT       Assessment & Plan:   Problem List Items Addressed This Visit      Unprioritized   ADHD (attention deficit hyperactivity disorder)    Refill Strattera at 40 mg daily          Follow up plan: Return in about 1 year (around 07/26/2018).

## 2017-07-28 ENCOUNTER — Ambulatory Visit: Payer: BC Managed Care – PPO | Admitting: Anesthesiology

## 2017-07-28 ENCOUNTER — Ambulatory Visit
Admission: RE | Admit: 2017-07-28 | Discharge: 2017-07-28 | Disposition: A | Discharge status: Home / Self Care | Payer: BC Managed Care – PPO | Source: Ambulatory Visit | Attending: Ophthalmology | Admitting: Ophthalmology

## 2017-07-28 ENCOUNTER — Encounter
Admission: RE | Disposition: A | Discharge status: Home / Self Care | Payer: Self-pay | Source: Ambulatory Visit | Attending: Ophthalmology

## 2017-07-28 DIAGNOSIS — E119 Type 2 diabetes mellitus without complications: Secondary | ICD-10-CM | POA: Insufficient documentation

## 2017-07-28 DIAGNOSIS — Z87442 Personal history of urinary calculi: Secondary | ICD-10-CM | POA: Diagnosis not present

## 2017-07-28 DIAGNOSIS — Z882 Allergy status to sulfonamides status: Secondary | ICD-10-CM | POA: Diagnosis not present

## 2017-07-28 DIAGNOSIS — F329 Major depressive disorder, single episode, unspecified: Secondary | ICD-10-CM | POA: Insufficient documentation

## 2017-07-28 DIAGNOSIS — Z9884 Bariatric surgery status: Secondary | ICD-10-CM | POA: Diagnosis not present

## 2017-07-28 DIAGNOSIS — E079 Disorder of thyroid, unspecified: Secondary | ICD-10-CM | POA: Diagnosis not present

## 2017-07-28 DIAGNOSIS — Z9889 Other specified postprocedural states: Secondary | ICD-10-CM | POA: Insufficient documentation

## 2017-07-28 DIAGNOSIS — E7800 Pure hypercholesterolemia, unspecified: Secondary | ICD-10-CM | POA: Diagnosis not present

## 2017-07-28 DIAGNOSIS — H2512 Age-related nuclear cataract, left eye: Secondary | ICD-10-CM | POA: Diagnosis not present

## 2017-07-28 DIAGNOSIS — Z880 Allergy status to penicillin: Secondary | ICD-10-CM | POA: Insufficient documentation

## 2017-07-28 DIAGNOSIS — Z90710 Acquired absence of both cervix and uterus: Secondary | ICD-10-CM | POA: Insufficient documentation

## 2017-07-28 DIAGNOSIS — Z86711 Personal history of pulmonary embolism: Secondary | ICD-10-CM | POA: Diagnosis not present

## 2017-07-28 DIAGNOSIS — M109 Gout, unspecified: Secondary | ICD-10-CM | POA: Insufficient documentation

## 2017-07-28 DIAGNOSIS — Z79899 Other long term (current) drug therapy: Secondary | ICD-10-CM | POA: Diagnosis not present

## 2017-07-28 DIAGNOSIS — G2581 Restless legs syndrome: Secondary | ICD-10-CM | POA: Insufficient documentation

## 2017-07-28 DIAGNOSIS — I10 Essential (primary) hypertension: Secondary | ICD-10-CM | POA: Insufficient documentation

## 2017-07-28 DIAGNOSIS — K219 Gastro-esophageal reflux disease without esophagitis: Secondary | ICD-10-CM | POA: Insufficient documentation

## 2017-07-28 DIAGNOSIS — M1990 Unspecified osteoarthritis, unspecified site: Secondary | ICD-10-CM | POA: Insufficient documentation

## 2017-07-28 DIAGNOSIS — D649 Anemia, unspecified: Secondary | ICD-10-CM | POA: Insufficient documentation

## 2017-07-28 DIAGNOSIS — F909 Attention-deficit hyperactivity disorder, unspecified type: Secondary | ICD-10-CM | POA: Diagnosis not present

## 2017-07-28 DIAGNOSIS — Z9841 Cataract extraction status, right eye: Secondary | ICD-10-CM | POA: Insufficient documentation

## 2017-07-28 DIAGNOSIS — Z87891 Personal history of nicotine dependence: Secondary | ICD-10-CM | POA: Diagnosis not present

## 2017-07-28 HISTORY — PX: CATARACT EXTRACTION W/PHACO: SHX586

## 2017-07-28 HISTORY — DX: Hypothyroidism, unspecified: E03.9

## 2017-07-28 LAB — GLUCOSE, CAPILLARY
GLUCOSE-CAPILLARY: 111 mg/dL — AB (ref 65–99)
GLUCOSE-CAPILLARY: 112 mg/dL — AB (ref 65–99)

## 2017-07-28 SURGERY — PHACOEMULSIFICATION, CATARACT, WITH IOL INSERTION
Anesthesia: Monitor Anesthesia Care | Laterality: Left | Wound class: Clean

## 2017-07-28 MED ORDER — LACTATED RINGERS IV SOLN: INTRAVENOUS | Status: DC

## 2017-07-28 MED ORDER — MOXIFLOXACIN HCL 0.5 % OP SOLN
OPHTHALMIC | Status: DC | PRN
  Administered 2017-07-28: .2 mL via OPHTHALMIC

## 2017-07-28 MED ORDER — FENTANYL CITRATE (PF) 100 MCG/2ML IJ SOLN
INTRAMUSCULAR | Status: DC | PRN
  Administered 2017-07-28 (×2): 50 ug via INTRAVENOUS

## 2017-07-28 MED ORDER — NA HYALUR & NA CHOND-NA HYALUR 0.4-0.35 ML IO KIT
PACK | INTRAOCULAR | Status: DC | PRN
  Administered 2017-07-28: 1 mL via INTRAOCULAR

## 2017-07-28 MED ORDER — ARMC OPHTHALMIC DILATING DROPS
1.0000 "application " | OPHTHALMIC | Status: DC | PRN
  Administered 2017-07-28 (×3): 1 via OPHTHALMIC

## 2017-07-28 MED ORDER — BSS IO SOLN
INTRAOCULAR | Status: DC | PRN
  Administered 2017-07-28: 72 mL via OPHTHALMIC

## 2017-07-28 MED ORDER — LIDOCAINE HCL 2 % IJ SOLN
INTRAMUSCULAR | Status: DC | PRN
  Administered 2017-07-28: 1 mL

## 2017-07-28 MED ORDER — MIDAZOLAM HCL 2 MG/2ML IJ SOLN
INTRAMUSCULAR | Status: DC | PRN
  Administered 2017-07-28: 2 mg via INTRAVENOUS

## 2017-07-28 MED ORDER — MOXIFLOXACIN HCL 0.5 % OP SOLN
1.0000 [drp] | OPHTHALMIC | Status: DC | PRN
Start: 2017-07-28 — End: 2017-07-28
  Administered 2017-07-28 (×3): 1 [drp] via OPHTHALMIC

## 2017-07-28 SURGICAL SUPPLY — 25 items
CANNULA ANT/CHMB 27GA (MISCELLANEOUS) ×3 IMPLANT
CARTRIDGE ABBOTT (MISCELLANEOUS) IMPLANT
GLOVE SURG LX 7.5 STRW (GLOVE) ×2
GLOVE SURG LX STRL 7.5 STRW (GLOVE) ×1 IMPLANT
GLOVE SURG TRIUMPH 8.0 PF LTX (GLOVE) ×3 IMPLANT
GOWN STRL REUS W/ TWL LRG LVL3 (GOWN DISPOSABLE) ×2 IMPLANT
GOWN STRL REUS W/TWL LRG LVL3 (GOWN DISPOSABLE) ×4
LENS IOL ACRYSOF IQ 21.5 (Intraocular Lens) ×3 IMPLANT
MARKER SKIN DUAL TIP RULER LAB (MISCELLANEOUS) ×3 IMPLANT
NDL RETROBULBAR .5 NSTRL (NEEDLE) IMPLANT
NEEDLE FILTER BLUNT 18X 1/2SAF (NEEDLE) ×2
NEEDLE FILTER BLUNT 18X1 1/2 (NEEDLE) ×1 IMPLANT
PACK CATARACT BRASINGTON (MISCELLANEOUS) ×3 IMPLANT
PACK EYE AFTER SURG (MISCELLANEOUS) ×3 IMPLANT
PACK OPTHALMIC (MISCELLANEOUS) ×3 IMPLANT
RING MALYGIN 7.0 (MISCELLANEOUS) IMPLANT
SUT ETHILON 10-0 CS-B-6CS-B-6 (SUTURE)
SUT VICRYL  9 0 (SUTURE)
SUT VICRYL 9 0 (SUTURE) IMPLANT
SUTURE EHLN 10-0 CS-B-6CS-B-6 (SUTURE) IMPLANT
SYR 3ML LL SCALE MARK (SYRINGE) ×3 IMPLANT
SYR 5ML LL (SYRINGE) ×3 IMPLANT
SYR TB 1ML LUER SLIP (SYRINGE) ×3 IMPLANT
WATER STERILE IRR 250ML POUR (IV SOLUTION) ×3 IMPLANT
WIPE NON LINTING 3.25X3.25 (MISCELLANEOUS) ×3 IMPLANT

## 2017-07-28 NOTE — Anesthesia Procedure Notes (Signed)
Procedure Name: MAC Performed by: Tumeka Chimenti Pre-anesthesia Checklist: Patient identified, Emergency Drugs available, Suction available, Timeout performed and Patient being monitored Patient Re-evaluated:Patient Re-evaluated prior to inductionOxygen Delivery Method: Nasal cannula Placement Confirmation: positive ETCO2     

## 2017-07-28 NOTE — Anesthesia Postprocedure Evaluation (Signed)
Anesthesia Post Note  Patient: Courtney Murphy  Procedure(s) Performed: Procedure(s) (LRB): CATARACT EXTRACTION PHACO AND INTRAOCULAR LENS PLACEMENT (IOC) diabetic Left (Left)  Patient location during evaluation: PACU Anesthesia Type: MAC Level of consciousness: awake and alert, oriented and patient cooperative Pain management: pain level controlled Vital Signs Assessment: post-procedure vital signs reviewed and stable Respiratory status: spontaneous breathing, nonlabored ventilation and respiratory function stable Cardiovascular status: blood pressure returned to baseline and stable Postop Assessment: adequate PO intake Anesthetic complications: no    Darrin Nipper

## 2017-07-28 NOTE — Progress Notes (Signed)
07/29/2017 9:54 AM   Courtney Murphy 1956-10-05 [id number]  Referring provider: Audelia Acton, MD No address on file  Chief Complaint  Patient presents with  . New Patient (Initial Visit)    kidney stone referred by ER    HPI: Patient is a 61 year old Caucasian female who is referred by [facility] ED for nephrolithiasis.  Patient states the onset of the pain was on 07/18/2017 and it was located in the lower abdomen.  It was crampy.  It lasted for several hours.  The pain was located in the lower abdomen and did not radiate.  The pain was a 8-9/10.  Nothing made the pain better.   Nothing made the pain worse.  She did not gross hematuria, fevers, chills, nausea or vomiting.  In the ED, her UA noted TNTC RBC's.  Her serum creatinine was 0.79.   Her WBC count was 14.2.    Contrast CT performed in the ED on 07/18/2017 noted a staghorn calculus in the lower pole the left kidney. No obstruction/ hydronephrosis.  The patient is status post gastric bypass.  Minimal atherosclerotic change seen in the abdominal aorta.  Moderate fecal loading in the colon.  Aortic Atherosclerosis   Today, She is experiencing nocturia, intermittency hesitancy and straining to urinate. She states these are baseline.  UA today demonstrates 3-10 rbc's.    She does have a prior history of stones.  She states that she was told she had a little stone.  Stone was 6 mm in size on CT performed in 2009.      PMH: Past Medical History:  Diagnosis Date  . Acid reflux 10/11/2015  . Anemia   . Depression   . Diabetes mellitus without complication ([place])   . Gout   . Hypertension   . Hypothyroidism   . Kidney stone   . Knee pain    Left  . Pulmonary emboli ([place])   . Restless leg syndrome   . Shoulder pain     Surgical History: Past Surgical History:  Procedure Laterality Date  . ABDOMINAL HYSTERECTOMY    . BACK SURGERY    . BREAST REDUCTION SURGERY    . CATARACT EXTRACTION W/PHACO Left 07/28/2017   Procedure: CATARACT EXTRACTION PHACO AND INTRAOCULAR LENS PLACEMENT ([place]) diabetic Left;  Surgeon: Leandrew Koyanagi, MD;  Location: [facility];  Service: Ophthalmology;  Laterality: Left;  Diabetic  . CESAREAN SECTION  1990  . CHOLECYSTECTOMY    . FOOT SURGERY    . GASTRIC BYPASS    . KNEE ARTHROSCOPY Left   . KNEE ARTHROSCOPY Left 07/19/2015   Procedure: ARTHROSCOPY KNEE;  Surgeon: Leanor Kail, MD;  Location: [facility];  Service: Orthopedics;  Laterality: Left;  . THUMB ARTHROSCOPY    . TONSILLECTOMY      Home Medications:  Allergies as of 07/29/2017      Reactions   Sulfasalazine Hives   Nsaids    Avoids because of gastric bypass   Sulfa Antibiotics Hives   Penicillins Hives, Rash   Has patient had a PCN reaction causing immediate rash, facial/tongue/throat swelling, SOB or lightheadedness with hypotension: No Has patient had a PCN reaction causing severe rash involving mucus membranes or skin necrosis: No Has patient had a PCN reaction that required hospitalization: No Has patient had a PCN reaction occurring within the last 10 years: No If all of the above answers are "NO", then may proceed with Cephalosporin use.      Medication List  Accurate as of 07/29/17  9:54 AM. Always use your most recent med list.          aspirin 81 MG tablet Take 81 mg by mouth daily.   atomoxetine 40 MG capsule Commonly known as:  STRATTERA Take 1 capsule (40 mg total) by mouth daily.   atorvastatin 20 MG tablet Commonly known as:  LIPITOR Take 20 mg by mouth daily.   B-12 1000 MCG/ML Kit Inject as directed once a week.   Biotin 7500 MCG Tabs Take 5,000 mcg by mouth daily.   CALCIUM-MAGNESIUM-ZINC PO Take 1 tablet by mouth at bedtime.   escitalopram 20 MG tablet Commonly known as:  LEXAPRO Take 20 mg by mouth daily.   ferrous sulfate 325 (65 FE) MG tablet Take 325 mg by mouth at bedtime.   gabapentin 300 MG capsule Commonly known as:  NEURONTIN Take 300  mg by mouth at bedtime.   levothyroxine 100 MCG tablet Commonly known as:  SYNTHROID, LEVOTHROID Take 100 mcg by mouth daily before breakfast.   lisinopril 20 MG tablet Commonly known as:  PRINIVIL,ZESTRIL Take 20 mg by mouth daily.   OMEGA 3 PO Take 200 mg by mouth daily.   ondansetron 4 MG disintegrating tablet Commonly known as:  ZOFRAN ODT Take 1 tablet (4 mg total) by mouth every 8 (eight) hours as needed for nausea or vomiting.   pramipexole 0.5 MG tablet Commonly known as:  MIRAPEX Take 0.5 mg by mouth 2 (two) times daily.   traMADol 50 MG tablet Commonly known as:  ULTRAM Take 1 tablet (50 mg total) by mouth every 6 (six) hours as needed.   TRULICITY 0.75 MG/0.5ML Sopn Generic drug:  Dulaglutide Inject 0.75 mg into the skin every Thursday.   Turmeric Curcumin 500 MG Caps Take 1,000 mg by mouth daily.   VITAMIN B COMPLEX PO Take 1 tablet by mouth daily.            Discharge Care Instructions        Start     Ordered   07/29/17 0000  Urinalysis, Complete     07/29/17 0905   07/29/17 0000  CULTURE, URINE COMPREHENSIVE     07/29/17 0905   07/29/17 0000  CBC with Differential/Platelet     07/29/17 0905   07/29/17 0000  Basic metabolic panel     07/29/17 0905      Allergies:  Allergies  Allergen Reactions  . Sulfasalazine Hives  . 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 Sulfa Antibiotics Hives  . Penicillins Hives and Rash    Has patient had a PCN reaction causing immediate rash, facial/tongue/throat swelling, SOB or lightheadedness with hypotension: No Has patient had a PCN reaction causing severe rash involving mucus membranes or skin necrosis: No Has patient had a PCN reaction that required hospitalization: No Has patient had a PCN reaction occurring within the last 10 years: No If all of the above answers are "NO", then may proceed with Cephalosporin use.     Family History: Family History  Problem Relation Age of Onset  . Cancer  Mother        breast  . Heart disease Father   . Diabetes Father   . Hypertension Father   . Cancer Maternal Grandmother   . Cancer Maternal Grandfather   . Heart disease Paternal Grandmother   . Heart disease Paternal Grandfather   . Cancer Maternal Aunt   . Cancer Maternal Uncle   .   Heart disease Paternal Aunt   . Heart disease Paternal Uncle   . Kidney cancer Neg Hx   . Bladder Cancer Neg Hx     Social History:  reports that she quit smoking about 2 years ago. Her smoking use included Cigarettes. She smoked 0.50 packs per day. She has never used smokeless tobacco. She reports that she does not drink alcohol or use drugs.  ROS: UROLOGY Frequent Urination?: No Hard to postpone urination?: No Burning/pain with urination?: No Get up at night to urinate?: Yes Leakage of urine?: No Urine stream starts and stops?: Yes Trouble starting stream?: Yes Do you have to strain to urinate?: Yes Blood in urine?: Yes Urinary tract infection?: No Sexually transmitted disease?: No Injury to kidneys or bladder?: No Painful intercourse?: No Weak stream?: No Currently pregnant?: No Vaginal bleeding?: No Last menstrual period?: n  Gastrointestinal Nausea?: No Vomiting?: No Indigestion/heartburn?: No Diarrhea?: No Constipation?: Yes  Constitutional Fever: No Night sweats?: No Weight loss?: No Fatigue?: No  Skin Skin rash/lesions?: No Itching?: No  Eyes Blurred vision?: No Double vision?: No  Ears/Nose/Throat Sore throat?: No Sinus problems?: No  Hematologic/Lymphatic Swollen glands?: No Easy bruising?: No  Cardiovascular Leg swelling?: No Chest pain?: No  Respiratory Cough?: No Shortness of breath?: No  Endocrine Excessive thirst?: No  Musculoskeletal Back pain?: No Joint pain?: No  Neurological Headaches?: No Dizziness?: No  Psychologic Depression?: No Anxiety?: No  Physical Exam: BP 133/84   Pulse 89   Ht 5' 4" (1.626 m)   Wt 185 lb 3.2 oz  (84 kg)   BMI 31.79 kg/m   Constitutional: Well nourished. Alert and oriented, No acute distress. HEENT: [place] AT, moist mucus membranes. Trachea midline, no masses. Cardiovascular: No clubbing, cyanosis, or edema. Respiratory: Normal respiratory effort, no increased work of breathing. GI: Abdomen is soft, non tender, non distended, no abdominal masses. Liver and spleen not palpable.  No hernias appreciated.  Stool sample for occult testing is not indicated.   GU: No CVA tenderness.  No bladder fullness or masses.   Skin: No rashes, bruises or suspicious lesions. Lymph: No cervical or inguinal adenopathy. Neurologic: Grossly intact, no focal deficits, moving all 4 extremities. Psychiatric: Normal mood and affect.  Laboratory Data: Lab Results  Component Value Date   WBC 14.2 (H) 07/18/2017   HGB 14.0 07/18/2017   HCT 40.2 07/18/2017   MCV 91.2 07/18/2017   PLT 331 07/18/2017    Lab Results  Component Value Date   CREATININE 0.79 07/18/2017    Lab Results  Component Value Date   AST 27 07/18/2017   Lab Results  Component Value Date   ALT 22 07/18/2017    Urinalysis 3-10 RBC's.  See EPIC.    I have reviewed the labs.   Pertinent Imaging: CLINICAL DATA:  Lower abdominal pain.  EXAM: CT ABDOMEN AND PELVIS WITH CONTRAST  TECHNIQUE: Multidetector CT imaging of the abdomen and pelvis was performed using the standard protocol following bolus administration of intravenous contrast.  CONTRAST:  37m ISOVUE-300 IOPAMIDOL (ISOVUE-300) INJECTION 61%, 1018mISOVUE-300 IOPAMIDOL (ISOVUE-300) INJECTION 61%  COMPARISON:  January 14, 2008  FINDINGS: Lower chest: No acute abnormality.  Hepatobiliary: No focal liver abnormality is seen. No gallstones, gallbladder wall thickening, or biliary dilatation.  Pancreas: Unremarkable. No pancreatic ductal dilatation or surrounding inflammatory changes.  Spleen: Normal in size without focal  abnormality.  Adrenals/Urinary Tract: There is a large staghorn calculus in the lower pole of the left kidney measuring 3 x 1.1 cm.  No other renal stones are identified. No suspicious renal masses. No hydronephrosis. No perinephric stranding. The ureters and bladder are normal.  Stomach/Bowel: The patient is status post gastric bypass. The small bowel is normal with no obstruction. Moderate fecal loading is seen in the colon. No other colonic abnormalities. The appendix is normal.  Vascular/Lymphatic: Minimal atherosclerotic changes seen in the abdominal aorta. No adenopathy.  Reproductive: The patient is status post hysterectomy.  Other: No free air or free fluid.  Musculoskeletal: No acute or significant osseous findings.  IMPRESSION: 1. 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 No obstruction/ hydronephrosis. 2. The patient is status post gastric bypass. 3. Minimal atherosclerotic change seen in the abdominal aorta. 4. Moderate fecal loading in the colon. Aortic Atherosclerosis (ICD10-I70.0).   Electronically Signed   By: David  Williams III M.D   On: 07/18/2017 16:51  I have independently reviewed the films.    Assessment & Plan:    1. Staghorn calculus of the left kidney  - AUA guidelines state that renal stones > 20 mm in size should be treated with PCNL as first line therapy  - Patient is advised of the risks of PCNL, such as: Bleeding: Blood loss during PCNL is generally minimal, and the risk of blood transfusion ranges from 2-12%. Infection: Bacteria can at times crow within stones and therefore causing urinary tract infection and rarely sepsis during stone surgery, adjacent tissue organ injury: Rarely organ surrounding the kidney such as bowel, colon, blood vessels, spleen, and liver may be injured during surgery requiring emergent open surgery or further surgery. The chest cavity is in close proximity to the upper pole of the kidney and can be  axillae entered when accessing and upper pole kidney stone resulting in a pneumothorax. This may require the small chest tube be placed temporarily to drain air and fluid from around the long. Permanent damage to the kidney during PCNL resulting in loss of kidneys extremely rare. Damage and perforation to the ureter draining the kidney may result in scarring and obstruction requiring further surgery. Failure to remove the stone: Despite placement of 1 or more tracks into the kidney to remove stones, there is a small chance that PCNL may not be able to successfully remove gallstones as result of either size, number or location of stone within the collecting system. Additional treatment may be required.  - UA  - Urine culture  - BMP  - CBC    Follow up for left PCNL  These notes generated with voice recognition software. I apologize for typographical errors.  Skyann Ganim, PA-C  [facility] [address], [address] [place], [place] [address] [phone]  

## 2017-07-28 NOTE — Transfer of Care (Signed)
Immediate Anesthesia Transfer of Care Note  Patient: Courtney Murphy  Procedure(s) Performed: Procedure(s) with comments: CATARACT EXTRACTION PHACO AND INTRAOCULAR LENS PLACEMENT (IOC) diabetic Left (Left) - Diabetic  Patient Location: PACU  Anesthesia Type: MAC  Level of Consciousness: awake, alert  and patient cooperative  Airway and Oxygen Therapy: Patient Spontanous Breathing and Patient connected to supplemental oxygen  Post-op Assessment: Post-op Vital signs reviewed, Patient's Cardiovascular Status Stable, Respiratory Function Stable, Patent Airway and No signs of Nausea or vomiting  Post-op Vital Signs: Reviewed and stable  Complications: No apparent anesthesia complications

## 2017-07-28 NOTE — H&P (Signed)
The History and Physical notes are on paper, have been signed, and are to be scanned. The patient remains stable and unchanged from the H&P.   Previous H&P reviewed, patient examined, and there are no changes.  Wagner Tanzi 07/28/2017 7:45 AM

## 2017-07-28 NOTE — Op Note (Signed)
OPERATIVE NOTE  Courtney Murphy [id number] 07/28/2017   PREOPERATIVE DIAGNOSIS:  Nuclear sclerotic cataract left eye. H25.12   POSTOPERATIVE DIAGNOSIS:    Nuclear sclerotic cataract left eye.     PROCEDURE:  Phacoemusification with posterior chamber intraocular lens placement of the left eye   LENS:   Implant Name Type Inv. Item Serial No. Manufacturer Lot No. LRB No. Used  LENS IOL ACRYSOF IQ 21.5 - [id number] Intraocular Lens LENS IOL ACRYSOF IQ 21.5 [id number] ALCON   Left 1        ULTRASOUND TIME: 13  % of 1 minutes 10 seconds, CDE 9.0  SURGEON:  Wyonia Hough, MD   ANESTHESIA:  Topical with tetracaine drops and 2% Xylocaine jelly, augmented with 1% preservative-free intracameral lidocaine.    COMPLICATIONS:  None.   DESCRIPTION OF PROCEDURE:  The patient was identified in the holding room and transported to the operating room and placed in the supine position under the operating microscope.  The left eye was identified as the operative eye and it was prepped and draped in the usual sterile ophthalmic fashion.   A 1 millimeter clear-corneal paracentesis was made at the 1:30 position.  0.5 ml of preservative-free 1% lidocaine was injected into the anterior chamber.  The anterior chamber was filled with Viscoat viscoelastic.  A 2.4 millimeter keratome was used to make a near-clear corneal incision at the 10:30 position.  .  A curvilinear capsulorrhexis was made with a cystotome and capsulorrhexis forceps.  Balanced salt solution was used to hydrodissect and hydrodelineate the nucleus.   Phacoemulsification was then used in stop and chop fashion to remove the lens nucleus and epinucleus.  The remaining cortex was then removed using the irrigation and aspiration handpiece. Provisc was then placed into the capsular bag to distend it for lens placement.  A lens was then injected into the capsular bag.  The remaining viscoelastic was aspirated.   Wounds were hydrated with  balanced salt solution.  The anterior chamber was inflated to a physiologic pressure with balanced salt solution.  No wound leaks were noted. Vigamox 0.2 ml of a 1mg  per ml solution was injected into the anterior chamber for a dose of 0.2 mg of intracameral antibiotic at the completion of the case.   Timolol and Brimonidine drops were applied to the eye.  The patient was taken to the recovery room in stable condition without complications of anesthesia or surgery.  [name] 07/28/2017, 8:06 AM

## 2017-07-28 NOTE — Anesthesia Preprocedure Evaluation (Signed)
Anesthesia Evaluation  Patient identified by MRN, date of birth, ID band Patient awake    Reviewed: Allergy & Precautions, NPO status , Patient's Chart, lab work & pertinent test results  History of Anesthesia Complications Negative for: history of anesthetic complications  Airway Mallampati: I  TM Distance: >3 FB Neck ROM: Full    Dental no notable dental hx.    Pulmonary former smoker (quit 2016),  Snoring    Pulmonary exam normal breath sounds clear to auscultation       Cardiovascular Exercise Tolerance: Good hypertension, Normal cardiovascular exam Rhythm:Regular Rate:Normal  Hx PE in 2004 (not currently on anticoagulation)   Neuro/Psych PSYCHIATRIC DISORDERS (ADHD) Depression    GI/Hepatic GERD  ,Hx gastric bypass   Endo/Other  diabetes, Type 2Hypothyroidism   Renal/GU Renal disease (hx nephrolithiasis)     Musculoskeletal Gout    Abdominal   Peds  Hematology  (+) Blood dyscrasia, anemia ,   Anesthesia Other Findings   Reproductive/Obstetrics                             Anesthesia Physical Anesthesia Plan  ASA: II  Anesthesia Plan: MAC   Post-op Pain Management:    Induction: Intravenous  PONV Risk Score and Plan:   Airway Management Planned:   Additional Equipment:   Intra-op Plan:   Post-operative Plan:   Informed Consent: I have reviewed the patients History and Physical, chart, labs and discussed the procedure including the risks, benefits and alternatives for the proposed anesthesia with the patient or authorized representative who has indicated his/her understanding and acceptance.     Plan Discussed with: CRNA  Anesthesia Plan Comments:         Anesthesia Quick Evaluation

## 2017-07-29 ENCOUNTER — Ambulatory Visit: Payer: BC Managed Care – PPO | Admitting: Urology

## 2017-07-29 ENCOUNTER — Other Ambulatory Visit: Payer: Self-pay | Admitting: Radiology

## 2017-07-29 ENCOUNTER — Encounter: Payer: Self-pay | Admitting: Unknown Physician Specialty

## 2017-07-29 ENCOUNTER — Encounter: Payer: Self-pay | Admitting: Ophthalmology

## 2017-07-29 VITALS — BP 133/84 | HR 89 | Ht 64.0 in | Wt 185.2 lb

## 2017-07-29 DIAGNOSIS — N200 Calculus of kidney: Secondary | ICD-10-CM

## 2017-07-29 LAB — MICROSCOPIC EXAMINATION: WBC UA: NONE SEEN /HPF (ref 0–?)

## 2017-07-29 LAB — URINALYSIS, COMPLETE
BILIRUBIN UA: NEGATIVE
Glucose, UA: NEGATIVE
KETONES UA: NEGATIVE
LEUKOCYTES UA: NEGATIVE
Nitrite, UA: NEGATIVE
PH UA: 5.5 (ref 5.0–7.5)
PROTEIN UA: NEGATIVE
SPEC GRAV UA: 1.02 (ref 1.005–1.030)
Urobilinogen, Ur: 0.2 mg/dL (ref 0.2–1.0)

## 2017-07-29 NOTE — Addendum Note (Signed)
Addended byAl Corpus, Sherlonda Flater L on: 07/29/2017 10:18 AM   Modules accepted: Orders

## 2017-07-30 ENCOUNTER — Other Ambulatory Visit: Payer: Self-pay | Admitting: Radiology

## 2017-07-30 ENCOUNTER — Telehealth: Payer: Self-pay | Admitting: Radiology

## 2017-07-30 DIAGNOSIS — N200 Calculus of kidney: Secondary | ICD-10-CM

## 2017-07-30 LAB — BASIC METABOLIC PANEL
BUN/Creatinine Ratio: 13 (ref 12–28)
BUN: 11 mg/dL (ref 8–27)
CALCIUM: 9.6 mg/dL (ref 8.7–10.3)
CHLORIDE: 101 mmol/L (ref 96–106)
CO2: 22 mmol/L (ref 20–29)
Creatinine, Ser: 0.87 mg/dL (ref 0.57–1.00)
GFR, EST AFRICAN AMERICAN: 83 mL/min/{1.73_m2} (ref >59)
GFR, EST NON AFRICAN AMERICAN: 72 mL/min/{1.73_m2} (ref >59)
Glucose: 168 mg/dL — ABNORMAL HIGH (ref 65–99)
Potassium: 3.9 mmol/L (ref 3.5–5.2)
SODIUM: 141 mmol/L (ref 134–144)

## 2017-07-30 LAB — CBC WITH DIFFERENTIAL/PLATELET
BASOS: 0 %
Basophils Absolute: 0 10*3/uL (ref 0.0–0.2)
EOS (ABSOLUTE): 0.3 10*3/uL (ref 0.0–0.4)
EOS: 3 %
HEMATOCRIT: 41.3 % (ref 34.0–46.6)
HEMOGLOBIN: 13.6 g/dL (ref 11.1–15.9)
Immature Grans (Abs): 0 10*3/uL (ref 0.0–0.1)
Immature Granulocytes: 0 %
LYMPHS ABS: 2.4 10*3/uL (ref 0.7–3.1)
Lymphs: 26 %
MCH: 30.3 pg (ref 26.6–33.0)
MCHC: 32.9 g/dL (ref 31.5–35.7)
MCV: 92 fL (ref 79–97)
MONOCYTES: 3 %
MONOS ABS: 0.3 10*3/uL (ref 0.1–0.9)
Neutrophils Absolute: 6.4 10*3/uL (ref 1.4–7.0)
Neutrophils: 68 %
Platelets: 288 10*3/uL (ref 150–379)
RBC: 4.49 x10E6/uL (ref 3.77–5.28)
RDW: 13.1 % (ref 12.3–15.4)
WBC: 9.4 10*3/uL (ref 3.4–10.8)

## 2017-07-30 MED ORDER — ATOMOXETINE HCL 40 MG PO CAPS: 40.0000 mg | ORAL_CAPSULE | Freq: Two times a day (BID) | ORAL | 3 refills | Status: DC

## 2017-07-30 NOTE — Telephone Encounter (Signed)
Pt scheduled for PCNL with Dr Erlene Quan on 08/17/17. Made pt aware of surgery date, pre-admit testing appt & to arrive at [facility] at 6:30am on 08/17/17. Advised pt to hold ASA 81mg  x 7 days prior to surgery as pt states she has not been instructed to take ASA by an MD, she "just thought it was a good idea to take it".

## 2017-08-02 LAB — CULTURE, URINE COMPREHENSIVE

## 2017-08-06 ENCOUNTER — Other Ambulatory Visit: Payer: BC Managed Care – PPO

## 2017-08-09 ENCOUNTER — Encounter
Admission: RE | Admit: 2017-08-09 | Discharge: 2017-08-09 | Disposition: A | Discharge status: Home / Self Care | Payer: BC Managed Care – PPO | Source: Ambulatory Visit | Attending: Urology | Admitting: Urology

## 2017-08-09 DIAGNOSIS — E785 Hyperlipidemia, unspecified: Secondary | ICD-10-CM | POA: Diagnosis not present

## 2017-08-09 DIAGNOSIS — I10 Essential (primary) hypertension: Secondary | ICD-10-CM | POA: Diagnosis not present

## 2017-08-09 DIAGNOSIS — N200 Calculus of kidney: Secondary | ICD-10-CM | POA: Diagnosis not present

## 2017-08-09 DIAGNOSIS — Z01818 Encounter for other preprocedural examination: Secondary | ICD-10-CM | POA: Insufficient documentation

## 2017-08-09 DIAGNOSIS — E119 Type 2 diabetes mellitus without complications: Secondary | ICD-10-CM | POA: Insufficient documentation

## 2017-08-09 HISTORY — DX: Personal history of urinary calculi: Z87.442

## 2017-08-09 HISTORY — DX: Fatty (change of) liver, not elsewhere classified: K76.0

## 2017-08-09 LAB — SURGICAL PCR SCREEN
MRSA, PCR: NEGATIVE
Staphylococcus aureus: POSITIVE — AB

## 2017-08-09 NOTE — Patient Instructions (Addendum)
Your procedure is scheduled on: Tuesday 08/17/17 Report to [facility]. 2ND FLOOR MEDICAL MALL ENTRANCE. To find out your arrival time please call [phone] between 1PM - 3PM on Monday 08/16/17.  Remember: Instructions that are not followed completely may result in serious medical risk, up to and including death, or upon the discretion of your surgeon and anesthesiologist your surgery may need to be rescheduled.    __X__ 1. Do not eat anything after midnight the night before your    procedure.  No gum chewing or hard candies.  You may drink clear   liquids up to 2 hours before you are scheduled to arrive at the   hospital for your procedure. Do not drink clear liquids within 2   hours of scheduled arrival to the hospital as this may lead to your   procedure being delayed or rescheduled.       Clear liquids include:   Water or Apple juice without pulp   Clear carbohydrate beverage such as Clearfast or Gatorade   Black coffee or Clear Tea (no milk, no creamer, do not add anything   to the coffee or tea)    Diabetics should only drink water   __X__ 2. No Alcohol for 24 hours before or after surgery.   ____ 3. Bring all medications with you on the day of surgery if instructed.    __X__ 4. Notify your doctor if there is any change in your medical condition     (cold, fever, infections).             __X___5. No smoking within 24 hours of your surgery.     Do not wear jewelry, make-up, hairpins, clips or nail polish.  Do not wear lotions, powders, or perfumes.   Do not shave 48 hours prior to surgery. Men may shave face and neck.  Do not bring valuables to the hospital.    [facility] is not responsible for any belongings or valuables.               Contacts, dentures or bridgework may not be worn into surgery.  Leave your suitcase in the car. After surgery it may be brought to your room.  For patients admitted to the hospital, discharge time is determined by your                treatment  team.   Patients discharged the day of surgery will not be allowed to drive home.   Please read over the following fact sheets that you were given:   MRSA Information   __X__ Take these medicines the morning of surgery with A SIP OF WATER:    1. ATORVASTATIN  2. LEXAPRO  3. LEVOTHYROXINE  4. MAY TAKE MIRAPEX  5.  6.  ____ Fleet Enema (as directed)   __X__ Use CHG Soap/SAGE wipes as directed  ____ Use inhalers on the day of surgery  ____ Stop metformin 2 days prior to surgery    ____ Take 1/2 of usual insulin dose the night before surgery and none on the morning of surgery.   ____ Stop Coumadin/Plavix/aspirin on   __X__ Stop Anti-inflammatories such as Advil, Aleve, Ibuprofen, Motrin, Naproxen, Naprosyn, Goodies,powder, or aspirin products.  OK to take Tylenol.   __X__ Stop supplements, Vitamin E, Fish Oil until after surgery.    ____ Bring C-Pap to the hospital.

## 2017-08-10 ENCOUNTER — Other Ambulatory Visit: Payer: Self-pay | Admitting: Radiology

## 2017-08-10 ENCOUNTER — Telehealth: Payer: Self-pay | Admitting: Radiology

## 2017-08-10 ENCOUNTER — Other Ambulatory Visit: Payer: BC Managed Care – PPO

## 2017-08-10 MED ORDER — MUPIROCIN 2 % EX OINT: 1.0000 "application " | TOPICAL_OINTMENT | Freq: Two times a day (BID) | CUTANEOUS | 0 refills | Status: DC

## 2017-08-10 NOTE — Telephone Encounter (Signed)
-----   Message from Hollice Espy, MD sent at 08/10/2017  3:20 PM EDT ----- Because she will have a flank incision, its probably reasonable to treat this. Please,call testing and find out the standard dosing for bactoban. Hollice Espy, MD

## 2017-08-10 NOTE — Pre-Procedure Instructions (Signed)
NO TREATMENT FOR POSITIVE STAPH AUREUS BY AMY AT DR Cherrie Gauze

## 2017-08-10 NOTE — Telephone Encounter (Signed)
Made pt aware of script sent to pharmacy. Instructions given for use of medication. Pt voices understanding.

## 2017-08-10 NOTE — Pre-Procedure Instructions (Signed)
Positive staph aureus results sent to Dr. Erlene Quan for review.  Asked if wanted any treatment &/or antibiotics?

## 2017-08-10 NOTE — Progress Notes (Signed)
Made pt aware of script for Bactroban 2% ointment sent to pharmacy. Instructions given for use. Pt voices understanding.

## 2017-08-12 DIAGNOSIS — E611 Iron deficiency: Secondary | ICD-10-CM | POA: Insufficient documentation

## 2017-08-16 ENCOUNTER — Other Ambulatory Visit: Payer: Self-pay | Admitting: Student

## 2017-08-17 ENCOUNTER — Ambulatory Visit: Payer: BC Managed Care – PPO

## 2017-08-17 ENCOUNTER — Encounter: Payer: Self-pay | Admitting: *Deleted

## 2017-08-17 ENCOUNTER — Encounter
Admission: RE | Disposition: A | Discharge status: Home / Self Care | Payer: Self-pay | Source: Ambulatory Visit | Attending: Urology

## 2017-08-17 ENCOUNTER — Ambulatory Visit: Payer: BC Managed Care – PPO | Admitting: Anesthesiology

## 2017-08-17 ENCOUNTER — Observation Stay
Admission: RE | Admit: 2017-08-17 | Discharge: 2017-08-18 | Disposition: A | Discharge status: Home / Self Care | Payer: BC Managed Care – PPO | Source: Ambulatory Visit | Attending: Urology | Admitting: Urology

## 2017-08-17 ENCOUNTER — Ambulatory Visit
Admission: RE | Admit: 2017-08-17 | Discharge: 2017-08-17 | Disposition: A | Discharge status: Home / Self Care | Payer: BC Managed Care – PPO | Source: Ambulatory Visit | Attending: Urology | Admitting: Urology

## 2017-08-17 DIAGNOSIS — Z87891 Personal history of nicotine dependence: Secondary | ICD-10-CM | POA: Diagnosis not present

## 2017-08-17 DIAGNOSIS — E785 Hyperlipidemia, unspecified: Secondary | ICD-10-CM | POA: Insufficient documentation

## 2017-08-17 DIAGNOSIS — Z9884 Bariatric surgery status: Secondary | ICD-10-CM | POA: Insufficient documentation

## 2017-08-17 DIAGNOSIS — M109 Gout, unspecified: Secondary | ICD-10-CM | POA: Diagnosis not present

## 2017-08-17 DIAGNOSIS — Z880 Allergy status to penicillin: Secondary | ICD-10-CM | POA: Insufficient documentation

## 2017-08-17 DIAGNOSIS — Z803 Family history of malignant neoplasm of breast: Secondary | ICD-10-CM | POA: Insufficient documentation

## 2017-08-17 DIAGNOSIS — Z7982 Long term (current) use of aspirin: Secondary | ICD-10-CM | POA: Insufficient documentation

## 2017-08-17 DIAGNOSIS — Z79899 Other long term (current) drug therapy: Secondary | ICD-10-CM | POA: Insufficient documentation

## 2017-08-17 DIAGNOSIS — Z86711 Personal history of pulmonary embolism: Secondary | ICD-10-CM | POA: Insufficient documentation

## 2017-08-17 DIAGNOSIS — Z8249 Family history of ischemic heart disease and other diseases of the circulatory system: Secondary | ICD-10-CM | POA: Diagnosis not present

## 2017-08-17 DIAGNOSIS — N209 Urinary calculus, unspecified: Secondary | ICD-10-CM

## 2017-08-17 DIAGNOSIS — E039 Hypothyroidism, unspecified: Secondary | ICD-10-CM | POA: Insufficient documentation

## 2017-08-17 DIAGNOSIS — F329 Major depressive disorder, single episode, unspecified: Secondary | ICD-10-CM | POA: Diagnosis not present

## 2017-08-17 DIAGNOSIS — K760 Fatty (change of) liver, not elsewhere classified: Secondary | ICD-10-CM | POA: Diagnosis not present

## 2017-08-17 DIAGNOSIS — Z90710 Acquired absence of both cervix and uterus: Secondary | ICD-10-CM | POA: Insufficient documentation

## 2017-08-17 DIAGNOSIS — Z87442 Personal history of urinary calculi: Secondary | ICD-10-CM | POA: Insufficient documentation

## 2017-08-17 DIAGNOSIS — G2581 Restless legs syndrome: Secondary | ICD-10-CM | POA: Diagnosis not present

## 2017-08-17 DIAGNOSIS — K219 Gastro-esophageal reflux disease without esophagitis: Secondary | ICD-10-CM | POA: Insufficient documentation

## 2017-08-17 DIAGNOSIS — I700 Atherosclerosis of aorta: Secondary | ICD-10-CM | POA: Diagnosis not present

## 2017-08-17 DIAGNOSIS — E119 Type 2 diabetes mellitus without complications: Secondary | ICD-10-CM | POA: Diagnosis not present

## 2017-08-17 DIAGNOSIS — Z882 Allergy status to sulfonamides status: Secondary | ICD-10-CM | POA: Diagnosis not present

## 2017-08-17 DIAGNOSIS — Z888 Allergy status to other drugs, medicaments and biological substances status: Secondary | ICD-10-CM | POA: Insufficient documentation

## 2017-08-17 DIAGNOSIS — Z809 Family history of malignant neoplasm, unspecified: Secondary | ICD-10-CM | POA: Diagnosis not present

## 2017-08-17 DIAGNOSIS — Z86718 Personal history of other venous thrombosis and embolism: Secondary | ICD-10-CM | POA: Diagnosis not present

## 2017-08-17 DIAGNOSIS — Z9842 Cataract extraction status, left eye: Secondary | ICD-10-CM | POA: Insufficient documentation

## 2017-08-17 DIAGNOSIS — I10 Essential (primary) hypertension: Secondary | ICD-10-CM | POA: Insufficient documentation

## 2017-08-17 DIAGNOSIS — D649 Anemia, unspecified: Secondary | ICD-10-CM | POA: Diagnosis not present

## 2017-08-17 DIAGNOSIS — Z9049 Acquired absence of other specified parts of digestive tract: Secondary | ICD-10-CM | POA: Insufficient documentation

## 2017-08-17 DIAGNOSIS — N200 Calculus of kidney: Secondary | ICD-10-CM | POA: Diagnosis not present

## 2017-08-17 HISTORY — PX: IR NEPHROSTOMY PLACEMENT LEFT: IMG6063

## 2017-08-17 HISTORY — PX: NEPHROLITHOTOMY: SHX5134

## 2017-08-17 LAB — CBC
HEMATOCRIT: 39.6 % (ref 35.0–47.0)
Hemoglobin: 13.7 g/dL (ref 12.0–16.0)
MCH: 31.6 pg (ref 26.0–34.0)
MCHC: 34.7 g/dL (ref 32.0–36.0)
MCV: 91.1 fL (ref 80.0–100.0)
Platelets: 264 10*3/uL (ref 150–440)
RBC: 4.35 MIL/uL (ref 3.80–5.20)
RDW: 12.8 % (ref 11.5–14.5)
WBC: 11.4 10*3/uL — ABNORMAL HIGH (ref 3.6–11.0)

## 2017-08-17 LAB — BASIC METABOLIC PANEL
ANION GAP: 9 (ref 5–15)
BUN: 16 mg/dL (ref 6–20)
CALCIUM: 8.3 mg/dL — AB (ref 8.9–10.3)
CO2: 24 mmol/L (ref 22–32)
CREATININE: 0.86 mg/dL (ref 0.44–1.00)
Chloride: 108 mmol/L (ref 101–111)
GLUCOSE: 158 mg/dL — AB (ref 65–99)
Potassium: 3.7 mmol/L (ref 3.5–5.1)
Sodium: 141 mmol/L (ref 135–145)

## 2017-08-17 LAB — ABO/RH: ABO/RH(D): O POS

## 2017-08-17 LAB — APTT: APTT: 28 s (ref 24–36)

## 2017-08-17 LAB — TYPE AND SCREEN
ABO/RH(D): O POS
Antibody Screen: NEGATIVE

## 2017-08-17 LAB — PROTIME-INR
INR: 0.92
PROTHROMBIN TIME: 12.3 s (ref 11.4–15.2)

## 2017-08-17 LAB — GLUCOSE, CAPILLARY: GLUCOSE-CAPILLARY: 100 mg/dL — AB (ref 65–99)

## 2017-08-17 SURGERY — NEPHROLITHOTOMY PERCUTANEOUS
Anesthesia: General | Laterality: Left | Wound class: Clean

## 2017-08-17 MED ORDER — FERROUS SULFATE 325 (65 FE) MG PO TABS
325.0000 mg | ORAL_TABLET | Freq: Every day | ORAL | Status: DC
  Administered 2017-08-17: 325 mg via ORAL
  Filled 2017-08-17: qty 1

## 2017-08-17 MED ORDER — FENTANYL CITRATE (PF) 100 MCG/2ML IJ SOLN
INTRAMUSCULAR | Status: AC
  Filled 2017-08-17: qty 2

## 2017-08-17 MED ORDER — MIDAZOLAM HCL 2 MG/2ML IJ SOLN
INTRAMUSCULAR | Status: DC | PRN
  Administered 2017-08-17: 2 mg via INTRAVENOUS

## 2017-08-17 MED ORDER — ESCITALOPRAM OXALATE 20 MG PO TABS
40.0000 mg | ORAL_TABLET | Freq: Every day | ORAL | Status: DC
  Administered 2017-08-18: 40 mg via ORAL
  Filled 2017-08-17: qty 2

## 2017-08-17 MED ORDER — SODIUM CHLORIDE 0.9 % IV SOLN
INTRAVENOUS | Status: DC
  Administered 2017-08-17 – 2017-08-18 (×3): via INTRAVENOUS

## 2017-08-17 MED ORDER — OXYCODONE-ACETAMINOPHEN 5-325 MG PO TABS
1.0000 | ORAL_TABLET | ORAL | Status: DC | PRN
  Administered 2017-08-17 (×2): 1 via ORAL
  Administered 2017-08-18 (×2): 2 via ORAL
  Filled 2017-08-17 (×2): qty 1
  Filled 2017-08-17 (×2): qty 2

## 2017-08-17 MED ORDER — ONDANSETRON HCL 4 MG/2ML IJ SOLN
INTRAMUSCULAR | Status: DC | PRN
  Administered 2017-08-17: 4 mg via INTRAVENOUS

## 2017-08-17 MED ORDER — LEVOTHYROXINE SODIUM 100 MCG PO TABS
100.0000 ug | ORAL_TABLET | Freq: Every day | ORAL | Status: DC
  Administered 2017-08-18: 100 ug via ORAL
  Filled 2017-08-17: qty 1

## 2017-08-17 MED ORDER — OXYCODONE HCL 5 MG/5ML PO SOLN: 5.0000 mg | Freq: Once | ORAL | Status: DC | PRN

## 2017-08-17 MED ORDER — SUCCINYLCHOLINE CHLORIDE 20 MG/ML IJ SOLN
INTRAMUSCULAR | Status: AC
  Filled 2017-08-17: qty 1

## 2017-08-17 MED ORDER — DOCUSATE SODIUM 100 MG PO CAPS
100.0000 mg | ORAL_CAPSULE | Freq: Two times a day (BID) | ORAL | Status: DC
  Administered 2017-08-17 – 2017-08-18 (×2): 100 mg via ORAL
  Filled 2017-08-17 (×2): qty 1

## 2017-08-17 MED ORDER — DIPHENHYDRAMINE HCL 12.5 MG/5ML PO ELIX
12.5000 mg | ORAL_SOLUTION | Freq: Four times a day (QID) | ORAL | Status: DC | PRN
  Filled 2017-08-17: qty 5

## 2017-08-17 MED ORDER — ORAL CARE MOUTH RINSE
15.0000 mL | Freq: Two times a day (BID) | OROMUCOSAL | Status: DC
  Administered 2017-08-17: 15 mL via OROMUCOSAL

## 2017-08-17 MED ORDER — PROPOFOL 10 MG/ML IV BOLUS
INTRAVENOUS | Status: DC | PRN
  Administered 2017-08-17: 160 mg via INTRAVENOUS

## 2017-08-17 MED ORDER — ONDANSETRON HCL 4 MG/2ML IJ SOLN
INTRAMUSCULAR | Status: AC
  Filled 2017-08-17: qty 2

## 2017-08-17 MED ORDER — LISINOPRIL 20 MG PO TABS: 20.0000 mg | ORAL_TABLET | Freq: Every day | ORAL | Status: DC | PRN

## 2017-08-17 MED ORDER — LIDOCAINE HCL (PF) 2 % IJ SOLN
INTRAMUSCULAR | Status: AC
  Filled 2017-08-17: qty 4

## 2017-08-17 MED ORDER — MIDAZOLAM HCL 2 MG/2ML IJ SOLN
INTRAMUSCULAR | Status: AC
  Filled 2017-08-17: qty 2

## 2017-08-17 MED ORDER — CIPROFLOXACIN IN D5W 400 MG/200ML IV SOLN
INTRAVENOUS | Status: AC
  Filled 2017-08-17: qty 200

## 2017-08-17 MED ORDER — CLINDAMYCIN PHOSPHATE 600 MG/50ML IV SOLN
600.0000 mg | INTRAVENOUS | Status: AC
  Administered 2017-08-17: 600 mg via INTRAVENOUS

## 2017-08-17 MED ORDER — FENTANYL CITRATE (PF) 100 MCG/2ML IJ SOLN
25.0000 ug | INTRAMUSCULAR | Status: DC | PRN
  Administered 2017-08-17: 25 ug via INTRAVENOUS
  Administered 2017-08-17 (×2): 50 ug via INTRAVENOUS
  Administered 2017-08-17: 25 ug via INTRAVENOUS

## 2017-08-17 MED ORDER — LIDOCAINE HCL (CARDIAC) 20 MG/ML IV SOLN
INTRAVENOUS | Status: DC | PRN
  Administered 2017-08-17: 80 mg via INTRAVENOUS

## 2017-08-17 MED ORDER — SODIUM CHLORIDE 0.9 % IV SOLN
INTRAVENOUS | Status: DC | PRN
  Administered 2017-08-17: 50 ug/min via INTRAVENOUS

## 2017-08-17 MED ORDER — HEPARIN SODIUM (PORCINE) 5000 UNIT/ML IJ SOLN
5000.0000 [IU] | Freq: Three times a day (TID) | INTRAMUSCULAR | Status: DC
  Administered 2017-08-17 – 2017-08-18 (×2): 5000 [IU] via SUBCUTANEOUS
  Filled 2017-08-17 (×2): qty 1

## 2017-08-17 MED ORDER — ACETAMINOPHEN 160 MG/5ML PO SOLN
325.0000 mg | Freq: Once | ORAL | Status: DC
  Filled 2017-08-17: qty 20.3

## 2017-08-17 MED ORDER — PRAMIPEXOLE DIHYDROCHLORIDE 0.25 MG PO TABS
0.5000 mg | ORAL_TABLET | Freq: Every day | ORAL | Status: DC
  Administered 2017-08-17: 0.5 mg via ORAL
  Filled 2017-08-17: qty 2

## 2017-08-17 MED ORDER — PREDNISOLONE ACETATE 1 % OP SUSP
1.0000 [drp] | Freq: Two times a day (BID) | OPHTHALMIC | Status: DC
  Administered 2017-08-17 – 2017-08-18 (×2): 1 [drp] via OPHTHALMIC
  Filled 2017-08-17: qty 1

## 2017-08-17 MED ORDER — SODIUM CHLORIDE 0.9 % IV SOLN
INTRAVENOUS | Status: DC
  Administered 2017-08-17 (×2): via INTRAVENOUS

## 2017-08-17 MED ORDER — FENTANYL CITRATE (PF) 250 MCG/5ML IJ SOLN
INTRAMUSCULAR | Status: AC
Start: 2017-08-17 — End: ?
  Filled 2017-08-17: qty 5

## 2017-08-17 MED ORDER — LIDOCAINE HCL (PF) 1 % IJ SOLN
INTRAMUSCULAR | Status: AC
  Filled 2017-08-17: qty 30

## 2017-08-17 MED ORDER — SUGAMMADEX SODIUM 200 MG/2ML IV SOLN
INTRAVENOUS | Status: AC
  Filled 2017-08-17: qty 2

## 2017-08-17 MED ORDER — ONDANSETRON HCL 4 MG/2ML IJ SOLN: 4.0000 mg | INTRAMUSCULAR | Status: DC | PRN

## 2017-08-17 MED ORDER — SODIUM CHLORIDE 0.9 % IV SOLN
INTRAVENOUS | Status: DC
  Administered 2017-08-17: 07:00:00 via INTRAVENOUS

## 2017-08-17 MED ORDER — ROCURONIUM BROMIDE 100 MG/10ML IV SOLN
INTRAVENOUS | Status: DC | PRN
  Administered 2017-08-17 (×4): 20 mg via INTRAVENOUS
  Administered 2017-08-17: 10 mg via INTRAVENOUS
  Administered 2017-08-17: 50 mg via INTRAVENOUS

## 2017-08-17 MED ORDER — MORPHINE SULFATE (PF) 2 MG/ML IV SOLN
2.0000 mg | INTRAVENOUS | Status: DC | PRN
  Administered 2017-08-17 – 2017-08-18 (×3): 2 mg via INTRAVENOUS
  Filled 2017-08-17 (×3): qty 1

## 2017-08-17 MED ORDER — CIPROFLOXACIN IN D5W 400 MG/200ML IV SOLN: 400.0000 mg | Freq: Two times a day (BID) | INTRAVENOUS | Status: DC

## 2017-08-17 MED ORDER — CLINDAMYCIN PHOSPHATE 600 MG/50ML IV SOLN
INTRAVENOUS | Status: AC
  Filled 2017-08-17: qty 50

## 2017-08-17 MED ORDER — FAMOTIDINE 20 MG PO TABS
ORAL_TABLET | ORAL | Status: AC
  Administered 2017-08-17: 20 mg via ORAL
  Filled 2017-08-17: qty 1

## 2017-08-17 MED ORDER — IOTHALAMATE MEGLUMINE 43 % IV SOLN
INTRAVENOUS | Status: DC | PRN
  Administered 2017-08-17: 50 mL via URETHRAL

## 2017-08-17 MED ORDER — MIDAZOLAM HCL 2 MG/2ML IJ SOLN
INTRAMUSCULAR | Status: AC | PRN
  Administered 2017-08-17 (×2): 1 mg via INTRAVENOUS

## 2017-08-17 MED ORDER — ROCURONIUM BROMIDE 50 MG/5ML IV SOLN
INTRAVENOUS | Status: AC
  Filled 2017-08-17: qty 1

## 2017-08-17 MED ORDER — FENTANYL CITRATE (PF) 100 MCG/2ML IJ SOLN
50.0000 ug | Freq: Once | INTRAMUSCULAR | Status: AC
  Administered 2017-08-17: 50 ug via INTRAVENOUS

## 2017-08-17 MED ORDER — PROPOFOL 10 MG/ML IV BOLUS
INTRAVENOUS | Status: AC
  Filled 2017-08-17: qty 20

## 2017-08-17 MED ORDER — IOPAMIDOL (ISOVUE-300) INJECTION 61%
30.0000 mL | Freq: Once | INTRAVENOUS | Status: AC | PRN
  Administered 2017-08-17: 09:00:00 10 mL via INTRAVENOUS

## 2017-08-17 MED ORDER — BELLADONNA ALKALOIDS-OPIUM 16.2-60 MG RE SUPP: 1.0000 | Freq: Four times a day (QID) | RECTAL | Status: DC | PRN

## 2017-08-17 MED ORDER — DIPHENHYDRAMINE HCL 50 MG/ML IJ SOLN: 12.5000 mg | Freq: Four times a day (QID) | INTRAMUSCULAR | Status: DC | PRN

## 2017-08-17 MED ORDER — CIPROFLOXACIN IN D5W 400 MG/200ML IV SOLN
400.0000 mg | Freq: Once | INTRAVENOUS | Status: AC
  Administered 2017-08-17: 09:00:00 400 mg via INTRAVENOUS
  Filled 2017-08-17: qty 200

## 2017-08-17 MED ORDER — ATOMOXETINE HCL 10 MG PO CAPS
80.0000 mg | ORAL_CAPSULE | Freq: Every day | ORAL | Status: DC
  Administered 2017-08-18: 80 mg via ORAL
  Filled 2017-08-17 (×2): qty 8

## 2017-08-17 MED ORDER — FENTANYL CITRATE (PF) 100 MCG/2ML IJ SOLN
INTRAMUSCULAR | Status: AC
  Administered 2017-08-17: 50 ug via INTRAVENOUS
  Filled 2017-08-17: qty 2

## 2017-08-17 MED ORDER — FENTANYL CITRATE (PF) 100 MCG/2ML IJ SOLN
INTRAMUSCULAR | Status: AC | PRN
  Administered 2017-08-17 (×3): 50 ug via INTRAVENOUS

## 2017-08-17 MED ORDER — ATORVASTATIN CALCIUM 20 MG PO TABS
20.0000 mg | ORAL_TABLET | Freq: Every day | ORAL | Status: DC
  Administered 2017-08-18: 20 mg via ORAL
  Filled 2017-08-17: qty 1

## 2017-08-17 MED ORDER — PHENYLEPHRINE HCL 10 MG/ML IJ SOLN
INTRAMUSCULAR | Status: DC | PRN
  Administered 2017-08-17 (×2): 100 ug via INTRAVENOUS
  Administered 2017-08-17: 200 ug via INTRAVENOUS
  Administered 2017-08-17 (×2): 100 ug via INTRAVENOUS

## 2017-08-17 MED ORDER — ASPIRIN EC 81 MG PO TBEC
81.0000 mg | DELAYED_RELEASE_TABLET | Freq: Every day | ORAL | Status: DC
  Administered 2017-08-18: 81 mg via ORAL
  Filled 2017-08-17: qty 1

## 2017-08-17 MED ORDER — CIPROFLOXACIN HCL 500 MG PO TABS
500.0000 mg | ORAL_TABLET | Freq: Two times a day (BID) | ORAL | Status: AC
  Administered 2017-08-17: 500 mg via ORAL
  Filled 2017-08-17: qty 1

## 2017-08-17 MED ORDER — GABAPENTIN 300 MG PO CAPS: 300.0000 mg | ORAL_CAPSULE | Freq: Every evening | ORAL | Status: DC | PRN

## 2017-08-17 MED ORDER — FAMOTIDINE 20 MG PO TABS
20.0000 mg | ORAL_TABLET | Freq: Once | ORAL | Status: AC
  Administered 2017-08-17: 20 mg via ORAL

## 2017-08-17 MED ORDER — FENTANYL CITRATE (PF) 100 MCG/2ML IJ SOLN
INTRAMUSCULAR | Status: DC | PRN
  Administered 2017-08-17 (×3): 50 ug via INTRAVENOUS
  Administered 2017-08-17: 100 ug via INTRAVENOUS
  Administered 2017-08-17 (×2): 50 ug via INTRAVENOUS

## 2017-08-17 MED ORDER — ACETAMINOPHEN 325 MG PO TABS: 325.0000 mg | ORAL_TABLET | Freq: Once | ORAL | Status: DC

## 2017-08-17 MED ORDER — CIPROFLOXACIN IN D5W 400 MG/200ML IV SOLN: 400.0000 mg | INTRAVENOUS | Status: DC

## 2017-08-17 MED ORDER — FENTANYL CITRATE (PF) 100 MCG/2ML IJ SOLN
INTRAMUSCULAR | Status: AC
  Administered 2017-08-17: 25 ug via INTRAVENOUS
  Filled 2017-08-17: qty 2

## 2017-08-17 MED ORDER — OXYBUTYNIN CHLORIDE 5 MG PO TABS
5.0000 mg | ORAL_TABLET | Freq: Three times a day (TID) | ORAL | Status: DC | PRN
  Administered 2017-08-17: 5 mg via ORAL
  Filled 2017-08-17: qty 1

## 2017-08-17 MED ORDER — ACETAMINOPHEN 325 MG PO TABS: 650.0000 mg | ORAL_TABLET | ORAL | Status: DC | PRN

## 2017-08-17 MED ORDER — SUGAMMADEX SODIUM 200 MG/2ML IV SOLN
INTRAVENOUS | Status: DC | PRN
Start: 2017-08-17 — End: 2017-08-17
  Administered 2017-08-17: 180 mg via INTRAVENOUS

## 2017-08-17 MED ORDER — OXYCODONE HCL 5 MG PO TABS: 5.0000 mg | ORAL_TABLET | Freq: Once | ORAL | Status: DC | PRN

## 2017-08-17 SURGICAL SUPPLY — 67 items
ADAPTER IRRIG TUBE 2 SPIKE SOL (ADAPTER) ×6 IMPLANT
ADAPTER SCOPE UROLOK II (MISCELLANEOUS) ×3 IMPLANT
BAG URO DRAIN 2000ML W/SPOUT (MISCELLANEOUS) ×3 IMPLANT
BALLN NEPHROMAX 30X10X12 (MISCELLANEOUS) ×3
BALLOON NEPHROMAX 30X10X12 (MISCELLANEOUS) ×1 IMPLANT
BASKET ZERO TIP 1.9FR (BASKET) IMPLANT
BLADE SURG 15 STRL LF DISP TIS (BLADE) ×1 IMPLANT
BLADE SURG 15 STRL SS (BLADE) ×2
CATH COUNCIL 22FR (CATHETERS) IMPLANT
CATH FOLEY 2W COUNCIL 20FR 5CC (CATHETERS) IMPLANT
CATH FOLEY 2W COUNCIL 5CC 18FR (CATHETERS) ×3 IMPLANT
CATH STENT KAYE NEPHR TAMP (CATHETERS) IMPLANT
CATH URETL 5X70 OPEN END (CATHETERS) ×3 IMPLANT
CATH/STENT KAYE NEPHR TAMP (CATHETERS)
CHLORAPREP W/TINT 26ML (MISCELLANEOUS) ×3 IMPLANT
CNTNR SPEC 2.5X3XGRAD LEK (MISCELLANEOUS) ×1
CONRAY 43 FOR UROLOGY 50M (MISCELLANEOUS) ×9 IMPLANT
CONT SPEC 4OZ STER OR WHT (MISCELLANEOUS) ×2
CONTAINER SPEC 2.5X3XGRAD LEK (MISCELLANEOUS) ×1 IMPLANT
DRAPE C-ARM XRAY 36X54 (DRAPES) ×3 IMPLANT
DRAPE SHEET LG 3/4 BI-LAMINATE (DRAPES) ×3 IMPLANT
DRAPE SURG 17X11 SM STRL (DRAPES) ×12 IMPLANT
GAUZE SPONGE 4X4 12PLY STRL (GAUZE/BANDAGES/DRESSINGS) ×3 IMPLANT
GLIDEWIRE STIFF .35X180X3 HYDR (WIRE) ×3 IMPLANT
GLOVE BIO SURGEON STRL SZ 6.5 (GLOVE) ×2 IMPLANT
GLOVE BIO SURGEON STRL SZ7 (GLOVE) ×6 IMPLANT
GLOVE BIO SURGEONS STRL SZ 6.5 (GLOVE) ×1
GLOVE BIOGEL PI IND STRL 6.5 (GLOVE) ×2 IMPLANT
GLOVE BIOGEL PI INDICATOR 6.5 (GLOVE) ×4
GOWN STRL REUS W/ TWL LRG LVL3 (GOWN DISPOSABLE) ×2 IMPLANT
GOWN STRL REUS W/TWL LRG LVL3 (GOWN DISPOSABLE) ×4
GUIDEWIRE GREEN .038 145CM (MISCELLANEOUS) IMPLANT
GUIDEWIRE INTRO SET STRAIGHT (WIRE) ×3 IMPLANT
GUIDEWIRE STR ZIPWIRE 035X150 (MISCELLANEOUS) IMPLANT
GUIDEWIRE SUPER STIFF .035X180 (WIRE) IMPLANT
HOLDER FOLEY CATH W/STRAP (MISCELLANEOUS) ×3 IMPLANT
INTRODUCER DILATOR DOUBLE (INTRODUCER) IMPLANT
MANIFOLD NEPTUNE II (INSTRUMENTS) ×3 IMPLANT
MAT BLUE FLOOR 46X72 FLO (MISCELLANEOUS) ×6 IMPLANT
NDL FASCIA INCISION 18GA (NEEDLE) ×3 IMPLANT
PACK BASIN MINOR ARMC (MISCELLANEOUS) ×3 IMPLANT
PAD ABD DERMACEA PRESS 5X9 (GAUZE/BANDAGES/DRESSINGS) ×6 IMPLANT
PAD PREP 24X41 OB/GYN DISP (PERSONAL CARE ITEMS) ×3 IMPLANT
PROBE CYBERWAND SET (MISCELLANEOUS) ×3 IMPLANT
SENSORWIRE 0.038 NOT ANGLED (WIRE) ×3
SET IRRIGATING DISP (SET/KITS/TRAYS/PACK) ×3 IMPLANT
SHEET NEURO XL SOL CTL (MISCELLANEOUS) ×3 IMPLANT
SOL .9 NS 3000ML IRR  AL (IV SOLUTION) ×8
SOL .9 NS 3000ML IRR UROMATIC (IV SOLUTION) ×4 IMPLANT
SPONGE DRAIN TRACH 4X4 STRL 2S (GAUZE/BANDAGES/DRESSINGS) ×3 IMPLANT
STENT URET 6FRX24 CONTOUR (STENTS) IMPLANT
STENT URET 6FRX26 CONTOUR (STENTS) IMPLANT
STRAP SAFETY BODY (MISCELLANEOUS) ×6 IMPLANT
SUT SILK 0 SH 30 (SUTURE) ×3 IMPLANT
SYR 20CC LL (SYRINGE) ×3 IMPLANT
SYR 30ML LL (SYRINGE) ×3 IMPLANT
SYRINGE 10CC LL (SYRINGE) ×3 IMPLANT
SYRINGE IRR TOOMEY STRL 70CC (SYRINGE) ×3 IMPLANT
TAPE CLOTH 10X20 WHT NS LF (TAPE) ×1 IMPLANT
TAPE CLOTH 2X10 WHT NS LF (TAPE) ×2
TAPE MICROFOAM 4IN (TAPE) ×3 IMPLANT
TRAP SPECIMEN MUCOUS 40CC (MISCELLANEOUS) IMPLANT
TRAY FOLEY W/METER SILVER 16FR (SET/KITS/TRAYS/PACK) ×3 IMPLANT
TUBING CONNECTING 10 (TUBING) ×2 IMPLANT
TUBING CONNECTING 10' (TUBING) ×1
WATER STERILE IRR 1000ML POUR (IV SOLUTION) ×3 IMPLANT
WIRE SENSOR 0.038 NOT ANGLED (WIRE) ×1 IMPLANT

## 2017-08-17 NOTE — H&P (View-Only) (Signed)
07/29/2017 9:54 AM   Courtney Murphy March 27, 1956 [id number]  Referring provider: Audelia Acton, MD No address on file  Chief Complaint  Patient presents with  . New Patient (Initial Visit)    kidney stone referred by ER    HPI: Patient is a 61 year old Caucasian female who is referred by [facility] ED for nephrolithiasis.  Patient states the onset of the pain was on 07/18/2017 and it was located in the lower abdomen.  It was crampy.  It lasted for several hours.  The pain was located in the lower abdomen and did not radiate.  The pain was a 8-9/10.  Nothing made the pain better.   Nothing made the pain worse.  She did not gross hematuria, fevers, chills, nausea or vomiting.  In the ED, her UA noted TNTC RBC's.  Her serum creatinine was 0.79.   Her WBC count was 14.2.    Contrast CT performed in the ED on 07/18/2017 noted a staghorn calculus in the lower pole the left kidney. No obstruction/ hydronephrosis.  The patient is status post gastric bypass.  Minimal atherosclerotic change seen in the abdominal aorta.  Moderate fecal loading in the colon.  Aortic Atherosclerosis   Today, She is experiencing nocturia, intermittency hesitancy and straining to urinate. She states these are baseline.  UA today demonstrates 3-10 rbc's.    She does have a prior history of stones.  She states that she was told she had a little stone.  Stone was 6 mm in size on CT performed in 2009.      PMH: Past Medical History:  Diagnosis Date  . Acid reflux 10/11/2015  . Anemia   . Depression   . Diabetes mellitus without complication ([place])   . Gout   . Hypertension   . Hypothyroidism   . Kidney stone   . Knee pain    Left  . Pulmonary emboli ([place])   . Restless leg syndrome   . Shoulder pain     Surgical History: Past Surgical History:  Procedure Laterality Date  . ABDOMINAL HYSTERECTOMY    . BACK SURGERY    . BREAST REDUCTION SURGERY    . CATARACT EXTRACTION W/PHACO Left 07/28/2017   Procedure: CATARACT EXTRACTION PHACO AND INTRAOCULAR LENS PLACEMENT ([place]) diabetic Left;  Surgeon: Leandrew Koyanagi, MD;  Location: [facility];  Service: Ophthalmology;  Laterality: Left;  Diabetic  . CESAREAN SECTION  1990  . CHOLECYSTECTOMY    . FOOT SURGERY    . GASTRIC BYPASS    . KNEE ARTHROSCOPY Left   . KNEE ARTHROSCOPY Left 07/19/2015   Procedure: ARTHROSCOPY KNEE;  Surgeon: Leanor Kail, MD;  Location: [facility];  Service: Orthopedics;  Laterality: Left;  . THUMB ARTHROSCOPY    . TONSILLECTOMY      Home Medications:  Allergies as of 07/29/2017      Reactions   Sulfasalazine Hives   Nsaids    Avoids because of gastric bypass   Sulfa Antibiotics Hives   Penicillins Hives, Rash   Has patient had a PCN reaction causing immediate rash, facial/tongue/throat swelling, SOB or lightheadedness with hypotension: No Has patient had a PCN reaction causing severe rash involving mucus membranes or skin necrosis: No Has patient had a PCN reaction that required hospitalization: No Has patient had a PCN reaction occurring within the last 10 years: No If all of the above answers are "NO", then may proceed with Cephalosporin use.      Medication List  Accurate as of 07/29/17  9:54 AM. Always use your most recent med list.          aspirin 81 MG tablet Take 81 mg by mouth daily.   atomoxetine 40 MG capsule Commonly known as:  STRATTERA Take 1 capsule (40 mg total) by mouth daily.   atorvastatin 20 MG tablet Commonly known as:  LIPITOR Take 20 mg by mouth daily.   B-12 1000 MCG/ML Kit Inject as directed once a week.   Biotin 7500 MCG Tabs Take 5,000 mcg by mouth daily.   CALCIUM-MAGNESIUM-ZINC PO Take 1 tablet by mouth at bedtime.   escitalopram 20 MG tablet Commonly known as:  LEXAPRO Take 20 mg by mouth daily.   ferrous sulfate 325 (65 FE) MG tablet Take 325 mg by mouth at bedtime.   gabapentin 300 MG capsule Commonly known as:  NEURONTIN Take 300  mg by mouth at bedtime.   levothyroxine 100 MCG tablet Commonly known as:  SYNTHROID, LEVOTHROID Take 100 mcg by mouth daily before breakfast.   lisinopril 20 MG tablet Commonly known as:  PRINIVIL,ZESTRIL Take 20 mg by mouth daily.   OMEGA 3 PO Take 200 mg by mouth daily.   ondansetron 4 MG disintegrating tablet Commonly known as:  ZOFRAN ODT Take 1 tablet (4 mg total) by mouth every 8 (eight) hours as needed for nausea or vomiting.   pramipexole 0.5 MG tablet Commonly known as:  MIRAPEX Take 0.5 mg by mouth 2 (two) times daily.   traMADol 50 MG tablet Commonly known as:  ULTRAM Take 1 tablet (50 mg total) by mouth every 6 (six) hours as needed.   TRULICITY 2.75 TZ/0.0FV Sopn Generic drug:  Dulaglutide Inject 0.75 mg into the skin every Thursday.   Turmeric Curcumin 500 MG Caps Take 1,000 mg by mouth daily.   VITAMIN B COMPLEX PO Take 1 tablet by mouth daily.            Discharge Care Instructions        Start     Ordered   07/29/17 0000  Urinalysis, Complete     07/29/17 0905   07/29/17 0000  CULTURE, URINE COMPREHENSIVE     07/29/17 0905   07/29/17 0000  CBC with Differential/Platelet     07/29/17 0905   07/29/17 4944  Basic metabolic panel     96/75/91 0905      Allergies:  Allergies  Allergen Reactions  . Sulfasalazine Hives  . 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 Sulfa Antibiotics Hives  . Penicillins Hives and Rash    Has patient had a PCN reaction causing immediate rash, facial/tongue/throat swelling, SOB or lightheadedness with hypotension: No Has patient had a PCN reaction causing severe rash involving mucus membranes or skin necrosis: No Has patient had a PCN reaction that required hospitalization: No Has patient had a PCN reaction occurring within the last 10 years: No If all of the above answers are "NO", then may proceed with Cephalosporin use.     Family History: Family History  Problem Relation Age of Onset  . Cancer  Mother        breast  . Heart disease Father   . Diabetes Father   . Hypertension Father   . Cancer Maternal Grandmother   . Cancer Maternal Grandfather   . Heart disease Paternal Grandmother   . Heart disease Paternal Grandfather   . Cancer Maternal Aunt   . Cancer Maternal Uncle   .  Heart disease Paternal Aunt   . Heart disease Paternal Uncle   . Kidney cancer Neg Hx   . Bladder Cancer Neg Hx     Social History:  reports that she quit smoking about 2 years ago. Her smoking use included Cigarettes. She smoked 0.50 packs per day. She has never used smokeless tobacco. She reports that she does not drink alcohol or use drugs.  ROS: UROLOGY Frequent Urination?: No Hard to postpone urination?: No Burning/pain with urination?: No Get up at night to urinate?: Yes Leakage of urine?: No Urine stream starts and stops?: Yes Trouble starting stream?: Yes Do you have to strain to urinate?: Yes Blood in urine?: Yes Urinary tract infection?: No Sexually transmitted disease?: No Injury to kidneys or bladder?: No Painful intercourse?: No Weak stream?: No Currently pregnant?: No Vaginal bleeding?: No Last menstrual period?: n  Gastrointestinal Nausea?: No Vomiting?: No Indigestion/heartburn?: No Diarrhea?: No Constipation?: Yes  Constitutional Fever: No Night sweats?: No Weight loss?: No Fatigue?: No  Skin Skin rash/lesions?: No Itching?: No  Eyes Blurred vision?: No Double vision?: No  Ears/Nose/Throat Sore throat?: No Sinus problems?: No  Hematologic/Lymphatic Swollen glands?: No Easy bruising?: No  Cardiovascular Leg swelling?: No Chest pain?: No  Respiratory Cough?: No Shortness of breath?: No  Endocrine Excessive thirst?: No  Musculoskeletal Back pain?: No Joint pain?: No  Neurological Headaches?: No Dizziness?: No  Psychologic Depression?: No Anxiety?: No  Physical Exam: BP 133/84   Pulse 89   Ht 5' 4" (1.626 m)   Wt 185 lb 3.2 oz  (84 kg)   BMI 31.79 kg/m   Constitutional: Well nourished. Alert and oriented, No acute distress. HEENT: [place] AT, moist mucus membranes. Trachea midline, no masses. Cardiovascular: No clubbing, cyanosis, or edema. Respiratory: Normal respiratory effort, no increased work of breathing. GI: Abdomen is soft, non tender, non distended, no abdominal masses. Liver and spleen not palpable.  No hernias appreciated.  Stool sample for occult testing is not indicated.   GU: No CVA tenderness.  No bladder fullness or masses.   Skin: No rashes, bruises or suspicious lesions. Lymph: No cervical or inguinal adenopathy. Neurologic: Grossly intact, no focal deficits, moving all 4 extremities. Psychiatric: Normal mood and affect.  Laboratory Data: Lab Results  Component Value Date   WBC 14.2 (H) 07/18/2017   HGB 14.0 07/18/2017   HCT 40.2 07/18/2017   MCV 91.2 07/18/2017   PLT 331 07/18/2017    Lab Results  Component Value Date   CREATININE 0.79 07/18/2017    Lab Results  Component Value Date   AST 27 07/18/2017   Lab Results  Component Value Date   ALT 22 07/18/2017    Urinalysis 3-10 RBC's.  See EPIC.    I have reviewed the labs.   Pertinent Imaging: CLINICAL DATA:  Lower abdominal pain.  EXAM: CT ABDOMEN AND PELVIS WITH CONTRAST  TECHNIQUE: Multidetector CT imaging of the abdomen and pelvis was performed using the standard protocol following bolus administration of intravenous contrast.  CONTRAST:  37m ISOVUE-300 IOPAMIDOL (ISOVUE-300) INJECTION 61%, 1018mISOVUE-300 IOPAMIDOL (ISOVUE-300) INJECTION 61%  COMPARISON:  January 14, 2008  FINDINGS: Lower chest: No acute abnormality.  Hepatobiliary: No focal liver abnormality is seen. No gallstones, gallbladder wall thickening, or biliary dilatation.  Pancreas: Unremarkable. No pancreatic ductal dilatation or surrounding inflammatory changes.  Spleen: Normal in size without focal  abnormality.  Adrenals/Urinary Tract: There is a large staghorn calculus in the lower pole of the left kidney measuring 3 x 1.1 cm.  No other renal stones are identified. No suspicious renal masses. No hydronephrosis. No perinephric stranding. The ureters and bladder are normal.  Stomach/Bowel: The patient is status post gastric bypass. The small bowel is normal with no obstruction. Moderate fecal loading is seen in the colon. No other colonic abnormalities. The appendix is normal.  Vascular/Lymphatic: Minimal atherosclerotic changes seen in the abdominal aorta. No adenopathy.  Reproductive: The patient is status post hysterectomy.  Other: No free air or free fluid.  Musculoskeletal: No acute or significant osseous findings.  IMPRESSION: 1. 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 No obstruction/ hydronephrosis. 2. The patient is status post gastric bypass. 3. Minimal atherosclerotic change seen in the abdominal aorta. 4. Moderate fecal loading in the colon. Aortic Atherosclerosis (ICD10-I70.0).   Electronically Signed   By: Dorise Bullion III M.D   On: 07/18/2017 16:51  I have independently reviewed the films.    Assessment & Plan:    1. Staghorn calculus of the left kidney  - AUA guidelines state that renal stones > 20 mm in size should be treated with PCNL as first line therapy  - Patient is advised of the risks of PCNL, such as: Bleeding: Blood loss during PCNL is generally minimal, and the risk of blood transfusion ranges from 2-12%. Infection: Bacteria can at times crow within stones and therefore causing urinary tract infection and rarely sepsis during stone surgery, adjacent tissue organ injury: Rarely organ surrounding the kidney such as bowel, colon, blood vessels, spleen, and liver may be injured during surgery requiring emergent open surgery or further surgery. The chest cavity is in close proximity to the upper pole of the kidney and can be  axillae entered when accessing and upper pole kidney stone resulting in a pneumothorax. This may require the small chest tube be placed temporarily to drain air and fluid from around the long. Permanent damage to the kidney during PCNL resulting in loss of kidneys extremely rare. Damage and perforation to the ureter draining the kidney may result in scarring and obstruction requiring further surgery. Failure to remove the stone: Despite placement of 1 or more tracks into the kidney to remove stones, there is a small chance that PCNL may not be able to successfully remove gallstones as result of either size, number or location of stone within the collecting system. Additional treatment may be required.  - UA  - Urine culture  - BMP  - CBC    Follow up for left PCNL  These notes generated with voice recognition software. I apologize for typographical errors.  Zara Council, [facility] [address], [place], [place] [address] [phone]

## 2017-08-17 NOTE — Anesthesia Preprocedure Evaluation (Signed)
Anesthesia Evaluation  Patient identified by MRN, date of birth, ID band Patient awake    Reviewed: Allergy & Precautions, H&P , NPO status , Patient's Chart, lab work & pertinent test results  History of Anesthesia Complications Negative for: history of anesthetic complications  Airway Mallampati: III  TM Distance: >3 FB Neck ROM: full    Dental  (+) Chipped   Pulmonary neg shortness of breath, former smoker,           Cardiovascular Exercise Tolerance: Good hypertension, (-) angina(-) Past MI and (-) DOE      Neuro/Psych PSYCHIATRIC DISORDERS Depression negative neurological ROS     GI/Hepatic Neg liver ROS, GERD  Medicated and Controlled,  Endo/Other  diabetes, Type 2Hypothyroidism   Renal/GU Renal disease     Musculoskeletal   Abdominal   Peds  Hematology negative hematology ROS (+)   Anesthesia Other Findings Past Medical History: 10/11/2015: Acid reflux No date: Anemia No date: Depression No date: Diabetes mellitus without complication ([facility]) No date: Fatty liver No date: Gout No date: History of kidney stones No date: Hypertension No date: Hypothyroidism No date: Kidney stone No date: Knee pain     Comment:  Left No date: Pulmonary emboli ([facility]) No date: Restless leg syndrome No date: Shoulder pain  Past Surgical History: No date: ABDOMINAL HYSTERECTOMY No date: BACK SURGERY No date: BREAST REDUCTION SURGERY 07/28/2017: CATARACT EXTRACTION W/PHACO; Left     Comment:  Procedure: CATARACT EXTRACTION PHACO AND INTRAOCULAR               LENS PLACEMENT ([place]) diabetic Left;  Surgeon: Leandrew Koyanagi, MD;  Location: [facility];  Service:               Ophthalmology;  Laterality: Left;  Diabetic 1990: CESAREAN SECTION No date: CHOLECYSTECTOMY No date: FOOT SURGERY No date: GASTRIC BYPASS No date: KNEE ARTHROSCOPY; Left 07/19/2015: KNEE ARTHROSCOPY; Left     Comment:   Procedure: ARTHROSCOPY KNEE;  Surgeon: Leanor Kail,               MD;  Location: [facility];  Service: Orthopedics;                Laterality: Left; No date: THUMB ARTHROSCOPY No date: TONSILLECTOMY     Reproductive/Obstetrics negative OB ROS                             Anesthesia Physical Anesthesia Plan  ASA: III  Anesthesia Plan: General ETT   Post-op Pain Management:    Induction: Intravenous  PONV Risk Score and Plan: 4 or greater and Ondansetron, Dexamethasone, Midazolam and Treatment may vary due to age or medical condition  Airway Management Planned: Oral ETT  Additional Equipment:   Intra-op Plan:   Post-operative Plan: Extubation in OR  Informed Consent: I have reviewed the patients History and Physical, chart, labs and discussed the procedure including the risks, benefits and alternatives for the proposed anesthesia with the patient or authorized representative who has indicated his/her understanding and acceptance.   Dental Advisory Given  Plan Discussed with: Anesthesiologist, CRNA and Surgeon  Anesthesia Plan Comments: (Patient consented for risks of anesthesia including but not limited to:  - adverse reactions to medications - damage to teeth, lips or other oral mucosa - sore throat or hoarseness - Damage to heart, brain, lungs or loss of life  Patient voiced understanding.)        Anesthesia Quick Evaluation

## 2017-08-17 NOTE — H&P (Signed)
Chief Complaint: Staghorn calculus of the left kidney  Referring Physician(s): McGowan,Shannon A  Supervising Physician: Inez Catalina  Patient Status: [facility] - Out-pt  History of Present Illness: Courtney Murphy is a 61 y.o. female who was seen by urology for nephrolithiasis.  She states the onset of the pain was on 07/18/2017 and it was located in the lower abdomen.    At that time she did not report gross hematuria, fevers, chills, nausea or vomiting.  In the ED, her UA noted TNTC RBC's.  Her serum creatinine was 0.79.   Her WBC count was 14.2.    Contrast CT performed in the ED on 07/18/2017 noted a staghorn calculus in the lower pole the left kidney. No obstruction/ hydronephrosis.    She is here today for placement of nephrostomy tube to allow for PCNL by Urology.  She is NPO. No blood thinners.     Past Medical History:  Diagnosis Date  . Acid reflux 10/11/2015  . Anemia   . Depression   . Diabetes mellitus without complication ([place])   . Fatty liver   . Gout   . History of kidney stones   . Hypertension   . Hypothyroidism   . Kidney stone   . Knee pain    Left  . Pulmonary emboli ([place])   . Restless leg syndrome   . Shoulder pain     Past Surgical History:  Procedure Laterality Date  . ABDOMINAL HYSTERECTOMY    . BACK SURGERY    . BREAST REDUCTION SURGERY    . CATARACT EXTRACTION W/PHACO Left 07/28/2017   Procedure: CATARACT EXTRACTION PHACO AND INTRAOCULAR LENS PLACEMENT ([place]) diabetic Left;  Surgeon: Leandrew Koyanagi, MD;  Location: [place];  Service: Ophthalmology;  Laterality: Left;  Diabetic  . CESAREAN SECTION  1990  . CHOLECYSTECTOMY    . FOOT SURGERY    . GASTRIC BYPASS    . KNEE ARTHROSCOPY Left   . KNEE ARTHROSCOPY Left 07/19/2015   Procedure: ARTHROSCOPY KNEE;  Surgeon: Leanor Kail, MD;  Location: [facility];  Service: Orthopedics;  Laterality: Left;  . THUMB ARTHROSCOPY    . TONSILLECTOMY       Allergies: Sulfasalazine; Nsaids; Sulfa antibiotics; and Penicillins  Medications: Prior to Admission medications   Medication Sig Start Date End Date Taking? Authorizing Provider  aspirin 81 MG tablet Take 81 mg by mouth daily.    [provider]  atomoxetine (STRATTERA) 40 MG capsule Take 1 capsule (40 mg total) by mouth 2 (two) times daily with a meal. Patient taking differently: Take 80 mg by mouth daily.  07/30/17   Kathrine Haddock, NP  atorvastatin (LIPITOR) 20 MG tablet Take 20 mg by mouth daily. 07/06/17   [provider]  B Complex Vitamins (VITAMIN B COMPLEX PO) Take 1 tablet by mouth daily.    [provider]  Biotin 5000 MCG CAPS Take 5,000 mcg by mouth daily.    [provider]  CALCIUM-MAGNESIUM-ZINC PO Take 3 tablets by mouth at bedtime.     [provider]  Cyanocobalamin (B-12) 1000 MCG/ML KIT Inject 1 Dose as directed every 14 (fourteen) days.     [provider]  escitalopram (LEXAPRO) 20 MG tablet Take 40 mg by mouth daily.     [provider]  ferrous sulfate 325 (65 FE) MG tablet Take 325 mg by mouth at bedtime.    [provider]  gabapentin (NEURONTIN) 300 MG capsule Take 300 mg by mouth at bedtime  as needed (nerve pain).  07/06/17   [provider]  levothyroxine (SYNTHROID, LEVOTHROID) 100 MCG tablet Take 100 mcg by mouth daily before breakfast.    [provider]  lisinopril (PRINIVIL,ZESTRIL) 20 MG tablet Take 20 mg by mouth daily as needed (high blood pressure).     [provider]  mupirocin ointment (BACTROBAN) 2 % Place 1 application into the nose 2 (two) times daily. 08/12/17   Hollice Espy, MD  naproxen sodium (ANAPROX) 220 MG tablet Take 440 mg by mouth as needed (pain).    [provider]  Omega-3 Fatty Acids (OMEGA 3 PO) Take 3 tablets by mouth every evening.     [provider]  pramipexole (MIRAPEX) 0.5 MG tablet Take 0.5 mg by mouth at  bedtime. May take an additional 0.5 mg for restless legs    [provider]  TRULICITY 8.33 AS/5.0NL SOPN Inject 0.75 mg into the skin every Thursday. 07/12/17   [provider]  Turmeric Curcumin 500 MG CAPS Take 1,000 mg by mouth at bedtime.     [provider]     Family History  Problem Relation Age of Onset  . Cancer Mother        breast  . Heart disease Father   . Diabetes Father   . Hypertension Father   . Cancer Maternal Grandmother   . Cancer Maternal Grandfather   . Heart disease Paternal Grandmother   . Heart disease Paternal Grandfather   . Cancer Maternal Aunt   . Cancer Maternal Uncle   . Heart disease Paternal Aunt   . Heart disease Paternal Uncle   . Kidney cancer Neg Hx   . Bladder Cancer Neg Hx     Social History   Social History  . Marital status: Widowed    Spouse name: N/A  . Number of children: N/A  . Years of education: N/A   Social History Main Topics  . Smoking status: Former Smoker    Packs/day: 0.50    Types: Cigarettes    Quit date: 04/14/2015  . Smokeless tobacco: Never Used  . Alcohol use No  . Drug use: No  . Sexual activity: No   Other Topics Concern  . None   Social History Narrative  . None     Review of Systems: A 12 point ROS discussed Review of Systems  Constitutional: Negative.   HENT: Negative.   Respiratory: Negative.   Cardiovascular: Negative.   Gastrointestinal: Negative.   Genitourinary: Positive for difficulty urinating, flank pain, frequency and urgency.       Nocturia  Skin: Negative.   Neurological: Negative.   Hematological: Negative.   Psychiatric/Behavioral: Negative.     Vital Signs: There were no vitals taken for this visit.  Physical Exam  Constitutional: She is oriented to person, place, and time. She appears well-developed.  HENT:  Head: Normocephalic and atraumatic.  Eyes: EOM are normal.  Neck: Normal range of motion.  Cardiovascular: Normal rate, regular rhythm  and normal heart sounds.   Pulmonary/Chest: Effort normal and breath sounds normal. No respiratory distress.  Abdominal: Soft. She exhibits no distension. There is no tenderness.  Musculoskeletal: Normal range of motion.  Neurological: She is alert and oriented to person, place, and time.  Skin: Skin is warm and dry.  Psychiatric: She has a normal mood and affect. Her behavior is normal. Judgment and thought content normal.    Imaging: Ct Abdomen Pelvis W Contrast  Result Date: 07/18/2017 CLINICAL DATA:  Lower abdominal pain. EXAM: CT ABDOMEN AND PELVIS WITH CONTRAST TECHNIQUE: Multidetector CT imaging of the abdomen and pelvis was performed using the standard protocol following bolus administration of intravenous contrast. CONTRAST:  65m ISOVUE-300 IOPAMIDOL (ISOVUE-300) INJECTION 61%, 108mISOVUE-300 IOPAMIDOL (ISOVUE-300) INJECTION 61% COMPARISON:  January 14, 2008 FINDINGS: Lower chest: No acute abnormality. Hepatobiliary: No focal liver abnormality is seen. No gallstones, gallbladder wall thickening, or biliary dilatation. Pancreas: Unremarkable. No pancreatic ductal dilatation or surrounding inflammatory changes. Spleen: Normal in size without focal abnormality. Adrenals/Urinary Tract: There is a large staghorn calculus in the lower pole of the left kidney measuring 3 x 1.1 cm. No other renal stones are identified. No suspicious renal masses. No hydronephrosis. No perinephric stranding. The ureters and bladder are normal. Stomach/Bowel: The patient is status post gastric bypass. The small bowel is normal with no obstruction. Moderate fecal loading is seen in the colon. No other colonic abnormalities. The appendix is normal. Vascular/Lymphatic: Minimal atherosclerotic changes seen in the abdominal aorta. No adenopathy. Reproductive: The patient is status post hysterectomy. Other: No free air or free fluid. Musculoskeletal: No acute or significant osseous findings. IMPRESSION: 1. Staghorn calculus  in the lower pole the left kidney. No obstruction/ hydronephrosis. 2. The patient is status post gastric bypass. 3. Minimal atherosclerotic change seen in the abdominal aorta. 4. Moderate fecal loading in the colon. Aortic Atherosclerosis (ICD10-I70.0). Electronically Signed   By: DaDorise BullionII M.D   On: 07/18/2017 16:51    Labs:  CBC:  Recent Labs  10/08/16 0825 07/18/17 1310 07/29/17 0916 08/17/17 0608  WBC 9.5 14.2* 9.4 11.4*  HGB 13.8 14.0 13.6 13.7  HCT 39.7 40.2 41.3 39.6  PLT 268 331 288 264    COAGS:  Recent Labs  08/17/17 0608  INR 0.92  APTT 28    BMP:  Recent Labs  09/30/16 0909 10/08/16 0825 07/18/17 1310 07/29/17 0916  NA 140 139 139 141  K 4.1 3.7 3.7 3.9  CL 104 103 103 101  CO2 '28 28 26 22  ' GLUCOSE 169* 248* 165* 168*  BUN '15 13 13 11  ' CALCIUM 9.2 9.2 9.3 9.6  CREATININE 0.70 0.73 0.79 0.87  GFRNONAA >60 >60 >60 72  GFRAA >60 >60 >60 83    LIVER FUNCTION TESTS:  Recent Labs  07/18/17 1310  BILITOT 0.3  AST 27  ALT 22  ALKPHOS 108  PROT 7.3  ALBUMIN 4.2    TUMOR MARKERS: No results for input(s): AFPTM, CEA, CA199, CHROMGRNA in the last 8760 hours.  Assessment and Plan:  Staghorn calculus of the left kidney  Will proceed with image guided placement of left percutaneous nephrostomy tube to allow access for PCNL.  Risks and benefits of nephrostomy tube were discussed with the patient including, but not limited to, infection, bleeding, significant bleeding causing loss or decrease in renal function or damage to adjacent structures.   This procedure involves the use of X-rays and because of the nature of the planned procedure, it is possible that we will have prolonged use of X-ray fluoroscopy.  Potential radiation risks to you include (but are not limited to) the following: - A slightly elevated risk for cancer  several years later in life. This risk is typically less than 0.5% percent. This risk is low in comparison to the  normal incidence of human cancer, which is 33% for women and 50% for men according to the [place]- Radiation induced injury can include skin redness, resembling a rash, tissue  breakdown / ulcers and hair loss (which can be temporary or permanent).   The likelihood of either of these occurring depends on the difficulty of the procedure and whether you are sensitive to radiation due to previous procedures, disease, or genetic conditions.   IF your procedure requires a prolonged use of radiation, you will be notified and given written instructions for further action.  It is your responsibility to monitor the irradiated area for the 2 weeks following the procedure and to notify your physician if you are concerned that you have suffered a radiation induced injury.    All of the patient's questions were answered, patient is agreeable to proceed.  Consent signed and in chart.   Thank you for this interesting consult.  I greatly enjoyed meeting VAUDIE ENGEBRETSEN and look forward to participating in their care.  A copy of this report was sent to the requesting provider on this date.  Electronically Signed: Murrell Redden, PA-C 08/17/2017, 8:16 AM   I spent a total of  30 Minutes in face to face in clinical consultation, greater than 50% of which was counseling/coordinating care for nephrostomy tube.

## 2017-08-17 NOTE — Anesthesia Procedure Notes (Signed)
Procedure Name: Intubation Date/Time: 08/17/2017 10:27 AM Performed by: Silvana Newness Pre-anesthesia Checklist: Patient identified, Emergency Drugs available, Suction available, Patient being monitored and Timeout performed Patient Re-evaluated:Patient Re-evaluated prior to induction Oxygen Delivery Method: Circle system utilized Preoxygenation: Pre-oxygenation with 100% oxygen Induction Type: IV induction Ventilation: Mask ventilation without difficulty Laryngoscope Size: Mac and 3 Grade View: Grade I Tube type: Oral Tube size: 7.0 mm Number of attempts: 1 Airway Equipment and Method: Stylet Placement Confirmation: ETT inserted through vocal cords under direct vision,  positive ETCO2 and breath sounds checked- equal and bilateral Secured at: 21 cm Tube secured with: Tape Dental Injury: Teeth and Oropharynx as per pre-operative assessment

## 2017-08-17 NOTE — Anesthesia Post-op Follow-up Note (Signed)
Anesthesia QCDR form completed.        

## 2017-08-17 NOTE — Transfer of Care (Signed)
Immediate Anesthesia Transfer of Care Note  Patient: Courtney Murphy  Procedure(s) Performed: Procedure(s): NEPHROLITHOTOMY PERCUTANEOUS WITH HOLMIUM LASER (Left)  Patient Location: PACU  Anesthesia Type:General  Level of Consciousness: awake, drowsy and patient cooperative  Airway & Oxygen Therapy: Patient Spontanous Breathing and Patient connected to face mask oxygen  Post-op Assessment: Report given to RN and Post -op Vital signs reviewed and stable  Post vital signs: Reviewed and stable  Last Vitals:  Vitals:   08/17/17 1010 08/17/17 1512  BP: 140/82 (P) 104/67  Pulse: 73   Resp: 16 (P) 12  Temp:  (P) 37.1 C  SpO2: 97%     Last Pain:  Vitals:   08/17/17 1010  TempSrc:   PainSc: 3          Complications: No apparent anesthesia complications

## 2017-08-17 NOTE — Interval H&P Note (Signed)
History and Physical Interval Note:  08/17/2017 7:27 AM  Courtney Murphy  has presented today for surgery, with the diagnosis of LEFT STAGHORN CALCULUS  The various methods of treatment have been discussed with the patient and family. After consideration of risks, benefits and other options for treatment, the patient has consented to  Procedure(s): NEPHROLITHOTOMY PERCUTANEOUS (Left) as a surgical intervention .  The patient's history has been reviewed, patient examined, no change in status, stable for surgery.  I have reviewed the patient's chart and labs.  Questions were answered to the patient's satisfaction.    RRR CTAB  Hollice Espy

## 2017-08-17 NOTE — Procedures (Signed)
Left PCN in prep for PCNL.  Kumpe cath left in bladder for access.  Complications:  None  Blood Loss: none  See dictation in canopy pacs

## 2017-08-18 ENCOUNTER — Telehealth: Payer: Self-pay | Admitting: Urology

## 2017-08-18 ENCOUNTER — Encounter: Payer: Self-pay | Admitting: Urology

## 2017-08-18 DIAGNOSIS — N200 Calculus of kidney: Secondary | ICD-10-CM

## 2017-08-18 LAB — BASIC METABOLIC PANEL
ANION GAP: 6 (ref 5–15)
BUN: 12 mg/dL (ref 6–20)
CHLORIDE: 109 mmol/L (ref 101–111)
CO2: 26 mmol/L (ref 22–32)
Calcium: 8.1 mg/dL — ABNORMAL LOW (ref 8.9–10.3)
Creatinine, Ser: 0.75 mg/dL (ref 0.44–1.00)
GFR calc non Af Amer: >60 mL/min (ref >60)
Glucose, Bld: 101 mg/dL — ABNORMAL HIGH (ref 65–99)
POTASSIUM: 3.8 mmol/L (ref 3.5–5.1)
SODIUM: 141 mmol/L (ref 135–145)

## 2017-08-18 LAB — CBC
HEMATOCRIT: 34 % — AB (ref 35.0–47.0)
HEMOGLOBIN: 11.5 g/dL — AB (ref 12.0–16.0)
MCH: 31 pg (ref 26.0–34.0)
MCHC: 33.9 g/dL (ref 32.0–36.0)
MCV: 91.4 fL (ref 80.0–100.0)
Platelets: 223 10*3/uL (ref 150–440)
RBC: 3.72 MIL/uL — AB (ref 3.80–5.20)
RDW: 12.8 % (ref 11.5–14.5)
WBC: 13.2 10*3/uL — ABNORMAL HIGH (ref 3.6–11.0)

## 2017-08-18 LAB — GLUCOSE, CAPILLARY: GLUCOSE-CAPILLARY: 134 mg/dL — AB (ref 65–99)

## 2017-08-18 MED ORDER — OXYBUTYNIN CHLORIDE 5 MG PO TABS: 5.0000 mg | ORAL_TABLET | Freq: Three times a day (TID) | ORAL | 0 refills | Status: DC | PRN

## 2017-08-18 MED ORDER — OXYCODONE-ACETAMINOPHEN 5-325 MG PO TABS: 1.0000 | ORAL_TABLET | ORAL | 0 refills | Status: DC | PRN

## 2017-08-18 MED ORDER — DOCUSATE SODIUM 100 MG PO CAPS
100.0000 mg | ORAL_CAPSULE | Freq: Two times a day (BID) | ORAL | 0 refills | Status: DC
Start: 2017-08-18 — End: 2017-10-12

## 2017-08-18 NOTE — Telephone Encounter (Signed)
I have sent a message to the scheduling department asking them to contact the patient for an appointment.  Thanks, [name]

## 2017-08-18 NOTE — Progress Notes (Signed)
MD ordered patient to be discharged home.  Discharge instructions were reviewed with the patient and she voiced understanding.  Follow-up appointment was made.  Prescriptions given to the patient.  IV was removed with catheter intact.  Dressing supplies were given to the patient and dressing was changed prior to discharge.  All patients questions were answered.  Patient left via wheelchair escorted by auxillary.

## 2017-08-18 NOTE — Telephone Encounter (Signed)
Danae Chen and Tisha from [facility] called to schedule appt for 2 week follow up, neither one knew really what the appt was for, only that the pt needed 2 weeks follow up with CT ... Due to the complexity and 100% booked schedule, I discussed this with Clarise Cruz whom stated to let her talk to Dr. Erlene Quan first. Please Advise.

## 2017-08-18 NOTE — Discharge Summary (Signed)
Date of admission: 08/17/2017  Date of discharge: 08/18/2017  Admission diagnosis: Left partial staghorn calculus  Discharge diagnosis: Same as above  Secondary diagnoses:  Patient Active Problem List   Diagnosis Date Noted  . Kidney stone 08/17/2017  . Type 2 diabetes mellitus without complication, without long-term current use of insulin ([place]) 04/22/2017  . Chest pain 09/30/2016  . Acid reflux 10/11/2015  . Adult hypothyroidism 10/11/2015  . ADHD (attention deficit hyperactivity disorder) 10/11/2015  . Bariatric surgery status 10/22/2014  . HLD (hyperlipidemia) 08/18/2013    History and Physical: For full details, please see admission history and physical. Briefly, Courtney Murphy is a 61 y.o. year old patient with Left partial staghorn calculus admitted following left percutaneous nephrolithotomy.   Hospital Course: Patient tolerated the procedure well.  She was then transferred to the floor after an uneventful PACU stay.  Her hospital course was uncomplicated.  On POD#1, her nephrostomy tube was clamped which was well tolerated. It was subsequently removed along with her Foley catheter. she had met discharge criteria: was eating a regular diet, was up and ambulating independently,  pain was well controlled, was voiding without a catheter, and was ready to for discharge.  Physical Exam  Constitutional: She is oriented to person, place, and time. She appears well-developed and well-nourished.  HENT:  Head: Normocephalic and atraumatic.  Cardiovascular: Normal rate.   Pulmonary/Chest: Effort normal. No respiratory distress.  Abdominal: Soft. She exhibits no distension.  Genitourinary:  Genitourinary Comments: Left nephrostomy tube site clean dry and intact with tube in place prior to removal. No surrounding erythema, clear yellow urine draining around tube.  Foley draining light pink tinged urine.  Neurological: She is alert and oriented to person, place, and time.  Skin: Skin is  warm and dry.  Vitals reviewed.   Laboratory values:   Recent Labs  08/17/17 0608 08/18/17 0358  WBC 11.4* 13.2*  HGB 13.7 11.5*  HCT 39.6 34.0*    Recent Labs  08/17/17 1530 08/18/17 0358  NA 141 141  K 3.7 3.8  CL 108 109  CO2 24 26  GLUCOSE 158* 101*  BUN 16 12  CREATININE 0.86 0.75  CALCIUM 8.3* 8.1*    Recent Labs  08/17/17 0608  INR 0.92   No results for input(s): LABURIN in the last 72 hours. Results for orders placed or performed during the hospital encounter of 08/09/17  Surgical pcr screen     Status: Abnormal   Collection Time: 08/09/17  1:52 PM  Result Value Ref Range Status   MRSA, PCR NEGATIVE NEGATIVE Final   Staphylococcus aureus POSITIVE (A) NEGATIVE Final    Comment: (NOTE) The Xpert SA Assay (FDA approved for NASAL specimens in patients 44 years of age and older), is one component of a comprehensive surveillance program. It is not intended to diagnose infection nor to guide or monitor treatment.     Disposition: Home  Discharge instruction: Reviewed personally with patient, and change left flank dressing as needed.  You have a ureteral stent in place.  This is a tube that extends from your kidney to your bladder.  This may cause urinary bleeding, burning with urination, and urinary frequency.  Please call our office or present to the ED if you develop fevers >101 or pain which is not able to be controlled with oral pain medications.  You may be given either Flomax and/ or ditropan to help with bladder spasms and stent pain in addition to pain medications.  [facility] [address] [place], [place] [address] [phone]   Discharge medications: Allergies as of 08/18/2017      Reactions   Sulfasalazine Hives   Nsaids    Avoids because of gastric bypass   Sulfa Antibiotics Hives   Penicillins Hives, Rash   Has patient had a PCN reaction causing immediate rash, facial/tongue/throat swelling,  SOB or lightheadedness with hypotension: No Has patient had a PCN reaction causing severe rash involving mucus membranes or skin necrosis: No Has patient had a PCN reaction that required hospitalization: No Has patient had a PCN reaction occurring within the last 10 years: No If all of the above answers are "NO", then may proceed with Cephalosporin use.      Medication List    TAKE these medications   aspirin 81 MG tablet Take 81 mg by mouth daily.   atomoxetine 40 MG capsule Commonly known as:  STRATTERA Take 1 capsule (40 mg total) by mouth 2 (two) times daily with a meal. What changed:  how much to take  when to take this   atorvastatin 20 MG tablet Commonly known as:  LIPITOR Take 20 mg by mouth daily.   B-12 1000 MCG/ML Kit Inject 1 Dose as directed every 14 (fourteen) days.   Biotin 5000 MCG Caps Take 5,000 mcg by mouth daily.   CALCIUM-MAGNESIUM-ZINC PO Take 3 tablets by mouth at bedtime.   docusate sodium 100 MG capsule Commonly known as:  COLACE Take 1 capsule (100 mg total) by mouth 2 (two) times daily.   escitalopram 20 MG tablet Commonly known as:  LEXAPRO Take 40 mg by mouth daily.   ferrous sulfate 325 (65 FE) MG tablet Take 325 mg by mouth at bedtime.   gabapentin 300 MG capsule Commonly known as:  NEURONTIN Take 300 mg by mouth at bedtime as needed (nerve pain).   levothyroxine 100 MCG tablet Commonly known as:  SYNTHROID, LEVOTHROID Take 100 mcg by mouth daily before breakfast.   lisinopril 20 MG tablet Commonly known as:  PRINIVIL,ZESTRIL Take 20 mg by mouth daily as needed (high blood pressure).   mupirocin ointment 2 % Commonly known as:  BACTROBAN Place 1 application into the nose 2 (two) times daily.   naproxen sodium 220 MG tablet Commonly known as:  ANAPROX Take 440 mg by mouth as needed (pain).   OMEGA 3 PO Take 3 tablets by mouth every evening.   oxybutynin 5 MG tablet Commonly known as:  DITROPAN Take 1 tablet (5 mg  total) by mouth every 8 (eight) hours as needed for bladder spasms.   oxyCODONE-acetaminophen 5-325 MG tablet Commonly known as:  PERCOCET Take 1-2 tablets by mouth every 4 (four) hours as needed for moderate pain or severe pain.   pramipexole 0.5 MG tablet Commonly known as:  MIRAPEX Take 0.5 mg by mouth at bedtime. May take an additional 0.5 mg for restless legs   TRULICITY 4.48 JE/5.6DJ Sopn Generic drug:  Dulaglutide Inject 0.75 mg into the skin every Thursday.   Turmeric Curcumin 500 MG Caps Take 1,000 mg by mouth at bedtime.   VITAMIN B COMPLEX PO Take 1 tablet by mouth daily.            Discharge Care Instructions        Start     Ordered   08/18/17 0000  Discharge patient    Question Answer Comment  Discharge disposition 01-Home or Self Care   Discharge patient date 08/18/2017  08/18/17 0825   08/18/17 0000  oxyCODONE-acetaminophen (PERCOCET) 5-325 MG tablet  Every 4 hours PRN     08/18/17 0825   08/18/17 0000  oxybutynin (DITROPAN) 5 MG tablet  Every 8 hours PRN     08/18/17 0825   08/18/17 0000  docusate sodium (COLACE) 100 MG capsule  2 times daily     08/18/17 0825      Followup:  Follow-up Information    Hollice Espy, MD. Go on 09/01/2017.   Specialty:  Urology Why:  Dr. Erlene Quan, Wednesday, 10/3 at 10:15 a.m.  [phone] Contact information: [place] [id number] [phone]

## 2017-08-18 NOTE — Telephone Encounter (Signed)
Apt made on 10-3 per angie patient notified

## 2017-08-18 NOTE — Telephone Encounter (Signed)
Patient was discharged from the hospital earlier this morning following left percutaneous nephrolithotomy procedure. The appropriate follow up was scheduled but I would like her to get a noncontrast CT abdomen pelvis either the day before or the morning of the follow-up appointment. Order placed here.  Please arrange.  Hollice Espy, MD

## 2017-08-18 NOTE — Op Note (Signed)
Date of procedure: 08/18/17  Preoperative diagnosis:  1. Left staghorn calculus   Postoperative diagnosis:  1. Same as above   Procedure: 1. Left percutaneous nephrolithotomy, 3 x 1.1 cm stone 2. Left antegrade nephrostogram 3. Left antegrade ureteroscopy with laser lithotripsy 4. Left antegrade ureteral stent placement 5. Left percutaneous nephrostomy tube placement 6. Interpretation of fluoroscopy less than 30 minutes 7. Cystoscopy  Surgeon: Hollice Espy, MD  Anesthesia: General  Complications: None  Intraoperative findings: Lower posterior pole access, very hard dominant stone treated, multiple spherical tiny stones residual, renal pelvis cleared but extensive tracking of sand-like gravel along the length of the ureter. Antegrade ureteral stent placed.  EBL: 150 cc   Specimens: Stone fragment  Drains: 6 by 24 French double-J ureteral stent on left, 69 French Foley catheter, 18 French council tip catheter as nephrostomy tube  Indication: Courtney Murphy is a 61 y.o. patient with with large left lower pole staghorn calculus.  After reviewing the management options for treatment, she elected to proceed with the above surgical procedure(s). We have discussed the potential benefits and risks of the procedure, side effects of the proposed treatment, the likelihood of the patient achieving the goals of the procedure, and any potential problems that might occur during the procedure or recuperation. Informed consent has been obtained.  She was taken down to interventional radiology were left posterior lower pole access was obtained in the form of nephroureteral catheter. She tolerated this procedure well.  Description of procedure:  The patient was taken to the operating room and general anesthesia was induced.  A 16 French Foley catheter was placed in the standard sterile fashion. She was then flipped into the prone position on the operating room table with care taken to pad all  pressure points. She is then prepped and draped in the standard sterile fashion, and preoperative antibiotics were administered.  A preoperative time-out was performed.   The existing left nephroureteral catheter was cannulated using a sensor wire down to the level of the bladder under fluoroscopic guidance. Scout imaging of the large lower pole stone was obtained.  The wire was then left in place the level of the bladder and the nephroureteral catheter was removed. Approximately 1 cm incision was created in the left flank in order to accommodate the nephrostomy tube sheath. At this point in time, a safety wire introducer was used to level the UPJ. The inner cannula was removed and a superstiff wire was advanced all the way down to the level of the bladder as a working wire. A fascial incisor needle was used over the Super Stiff wire to incise the fascia in a cruciate fashion. Next, a NephroMax balloon was advanced to the level of the stone using fluoroscopic guidance. The balloon was inflated up to 18 cm of water. The 30 cm exit sheath was advanced over the balloon into the appropriate position. This was left in place for several minutes to allow for hemostasis while the nephroscope was assembled. The nephroscope was then advanced through the tract were fat was identified and on oblique images, it appears the balloon was not advanced far enough. A second dilation was performed, this time deeper into the kidney. Again, had some difficulty advancing the nephroscope and with visualization. I then attempted to use [place] flexible cystoscope unsuccessfully and ultimately the ureteroscope which was able to enter the collecting system, identified the stone, and further define the anatomy.  A 273  laser fiber was then used to fragment a portion  of the stone, free up some overlying clot, and create some space in the collecting system in order to accommodate the nephroscope and facilitate visualization. It appeared once again  that this sheath had not been advanced far enough into the collecting system and thus the third dilation was performed this time on top of the stone. Finally, I was able to get the nephroscope into the level of the stone.  A cyber wand was brought in and the majority of the large dominant stone was fragmented and aspirated. The stone was very firm and somewhat difficult to fragment. Once this is completely cleared both visually and under fluoroscopy, numerous small spherical stones were then removed using a combination of a grasper as well as a 1.9 [place] to plus spinal basket. A large portion of this extremely spherical, tiny gravel was seen traversing down the ureter. Antegrade ureteroscopy all the way down to the iliacs revealed a fairly significant Steinstr[place] of these very small spherical stones. The majority of the larger stone burden was removed using a nitinol basket, but the smaller gravel-like pieces were so numerous and tiny, there were distant and difficult to grasp. Finally, once the upper tract collecting system was cleared of all significant stone debris and only small stones remained in the ureter, the decision was made to place an antegrade ureteral stent to help these fragments pass with the option of returning in a retrograde fashion to clear the residual stone burden in the ureter. The safety wire was backloaded over the nephroscope and a 6 x 24 French double-J ureteral stent was advanced over the working wire down to level of the bladder. The wire was fully withdrawn until coil was noted at the level of the bladder with a curl as well as within the renal pelvis. Finally, a Foley catheter was introduced through the sheath into the renal pelvis and the balloon was filled with 1.5 cc of a mixed contrast solution. An antegrade nephrostogram confirmed no urinary extravasation, as well as contrast down the ureter into the bladder. The sheath was removed leaving the Foley placement was secured with  silks 2. Upon attempted removal of the safety wire, resistance was met. The distal aspect of the wire in the bladder. A coil around the curl of the stent and was stuck. The patient remained in a prone position at this point time. A small incision was made in the drape and her Foley catheter was removed. She was then prepped using Betadine solution and a flexible cystoscope was advanced per urethra into the bladder. The bladder was inspected. The full coil of the ureteral stent was seen within the bladder as well as the wire. Using stent graspers, the distal aspect of the safety wire was grasped and able to be pulled out through her bladder without difficulty. Final Scout imaging confirmed appropriate position of the nephrostomy tube as well as double-J ureteral stent. Finally, she was cleaned and dried and a dressing of 4 x 4's, ABD pad, and foam tape was applied around the nephrostomy tube. The Foley catheter was replaced. She was repositioned the supine position, reversed from anesthesia, taken the PACU in stable condition.  Plan: Patient will be admitted overnight for observation. Her nephrostomy tube will be clamped early in the morning and if tolerated, drains will be removed. To follow-up in 2 weeks with a noncontrast CT scan to assess residual stone burden.  Hollice Espy, M.D.

## 2017-08-19 ENCOUNTER — Encounter: Payer: Self-pay | Admitting: Emergency Medicine

## 2017-08-19 ENCOUNTER — Emergency Department
Admission: EM | Admit: 2017-08-19 | Discharge: 2017-08-20 | Disposition: A | Discharge status: Home / Self Care | Payer: BC Managed Care – PPO | Attending: Emergency Medicine | Admitting: Emergency Medicine

## 2017-08-19 ENCOUNTER — Emergency Department: Payer: BC Managed Care – PPO

## 2017-08-19 DIAGNOSIS — N200 Calculus of kidney: Secondary | ICD-10-CM

## 2017-08-19 DIAGNOSIS — Z960 Presence of urogenital implants: Secondary | ICD-10-CM | POA: Diagnosis not present

## 2017-08-19 DIAGNOSIS — Z87442 Personal history of urinary calculi: Secondary | ICD-10-CM | POA: Insufficient documentation

## 2017-08-19 DIAGNOSIS — Z7982 Long term (current) use of aspirin: Secondary | ICD-10-CM | POA: Insufficient documentation

## 2017-08-19 DIAGNOSIS — Z794 Long term (current) use of insulin: Secondary | ICD-10-CM | POA: Diagnosis not present

## 2017-08-19 DIAGNOSIS — Z79899 Other long term (current) drug therapy: Secondary | ICD-10-CM | POA: Insufficient documentation

## 2017-08-19 DIAGNOSIS — R1012 Left upper quadrant pain: Secondary | ICD-10-CM | POA: Insufficient documentation

## 2017-08-19 DIAGNOSIS — E119 Type 2 diabetes mellitus without complications: Secondary | ICD-10-CM | POA: Insufficient documentation

## 2017-08-19 DIAGNOSIS — I10 Essential (primary) hypertension: Secondary | ICD-10-CM | POA: Insufficient documentation

## 2017-08-19 DIAGNOSIS — T8384XA Pain from genitourinary prosthetic devices, implants and grafts, initial encounter: Secondary | ICD-10-CM

## 2017-08-19 DIAGNOSIS — Z87891 Personal history of nicotine dependence: Secondary | ICD-10-CM | POA: Diagnosis not present

## 2017-08-19 DIAGNOSIS — G8918 Other acute postprocedural pain: Secondary | ICD-10-CM | POA: Insufficient documentation

## 2017-08-19 DIAGNOSIS — R350 Frequency of micturition: Secondary | ICD-10-CM | POA: Diagnosis not present

## 2017-08-19 LAB — CBC WITH DIFFERENTIAL/PLATELET
Basophils Absolute: 0.1 10*3/uL (ref 0–0.1)
Basophils Relative: 1 %
Eosinophils Absolute: 0.4 10*3/uL (ref 0–0.7)
Eosinophils Relative: 4 %
HEMATOCRIT: 32.4 % — AB (ref 35.0–47.0)
Hemoglobin: 11.2 g/dL — ABNORMAL LOW (ref 12.0–16.0)
LYMPHS PCT: 26 %
Lymphs Abs: 2.8 10*3/uL (ref 1.0–3.6)
MCH: 31.2 pg (ref 26.0–34.0)
MCHC: 34.6 g/dL (ref 32.0–36.0)
MCV: 90.1 fL (ref 80.0–100.0)
MONO ABS: 0.7 10*3/uL (ref 0.2–0.9)
MONOS PCT: 6 %
NEUTROS ABS: 6.9 10*3/uL — AB (ref 1.4–6.5)
Neutrophils Relative %: 63 %
Platelets: 232 10*3/uL (ref 150–440)
RBC: 3.59 MIL/uL — ABNORMAL LOW (ref 3.80–5.20)
RDW: 12.9 % (ref 11.5–14.5)
WBC: 10.8 10*3/uL (ref 3.6–11.0)

## 2017-08-19 MED ORDER — ONDANSETRON HCL 4 MG/2ML IJ SOLN
4.0000 mg | Freq: Once | INTRAMUSCULAR | Status: AC
  Administered 2017-08-19: 4 mg via INTRAVENOUS
  Filled 2017-08-19: qty 2

## 2017-08-19 MED ORDER — MORPHINE SULFATE (PF) 4 MG/ML IV SOLN
4.0000 mg | Freq: Once | INTRAVENOUS | Status: AC
  Administered 2017-08-19: 4 mg via INTRAVENOUS
  Filled 2017-08-19: qty 1

## 2017-08-19 MED ORDER — SODIUM CHLORIDE 0.9 % IV BOLUS (SEPSIS)
1000.0000 mL | Freq: Once | INTRAVENOUS | Status: AC
  Administered 2017-08-19: 1000 mL via INTRAVENOUS

## 2017-08-19 NOTE — Anesthesia Postprocedure Evaluation (Signed)
Anesthesia Post Note  Patient: Courtney Murphy  Procedure(s) Performed: Procedure(s) (LRB): NEPHROLITHOTOMY PERCUTANEOUS WITH HOLMIUM LASER (Left)  Patient location during evaluation: PACU Anesthesia Type: General Level of consciousness: awake and alert Pain management: pain level controlled Vital Signs Assessment: post-procedure vital signs reviewed and stable Respiratory status: spontaneous breathing, nonlabored ventilation, respiratory function stable and patient connected to nasal cannula oxygen Cardiovascular status: blood pressure returned to baseline and stable Postop Assessment: no apparent nausea or vomiting Anesthetic complications: no     Last Vitals:  Vitals:   08/18/17 0614 08/18/17 0938  BP: 131/71 128/75  Pulse: 78 81  Resp: 20 16  Temp: 36.8 C 37.1 C  SpO2: 100% 100%    Last Pain:  Vitals:   08/18/17 0938  TempSrc: Oral  PainSc:                  Precious Haws Demetric Dunnaway

## 2017-08-19 NOTE — ED Provider Notes (Signed)
[facility] Emergency Department Provider Note  ____________________________________________   First MD Initiated Contact with Patient 08/19/17 2133     (approximate)  I have reviewed the triage vital signs and the nursing notes.   HISTORY  Chief Complaint No chief complaint on file.   HPI Courtney Murphy is a 61 y.o. female who is postop day 3 from a nephrolithotomy with a laser and stenting who is presenting to the emergency department with left flank pain that started about 7 PM tonight. Says that her pain is a 7 out of 10 and starts in her left lower quadrant and radiates to her left flank. She says that the pain is cramping and she feels like she needs to urinate constantly. She tried an oxycodone without relief earlier tonight. She is denying any fever. Does not report any nausea or vomiting or diarrhea.   Past Medical History:  Diagnosis Date  . Acid reflux 10/11/2015  . Anemia   . Depression   . Diabetes mellitus without complication ([place])   . Fatty liver   . Gout   . History of kidney stones   . Hypertension   . Hypothyroidism   . Kidney stone   . Knee pain    Left  . Pulmonary emboli ([place])   . Restless leg syndrome   . Shoulder pain     Patient Active Problem List   Diagnosis Date Noted  . Kidney stone 08/17/2017  . Type 2 diabetes mellitus without complication, without long-term current use of insulin ([place]) 04/22/2017  . Chest pain 09/30/2016  . Acid reflux 10/11/2015  . Adult hypothyroidism 10/11/2015  . ADHD (attention deficit hyperactivity disorder) 10/11/2015  . Bariatric surgery status 10/22/2014  . HLD (hyperlipidemia) 08/18/2013    Past Surgical History:  Procedure Laterality Date  . ABDOMINAL HYSTERECTOMY    . BACK SURGERY    . BREAST REDUCTION SURGERY    . CATARACT EXTRACTION W/PHACO Left 07/28/2017   Procedure: CATARACT EXTRACTION PHACO AND INTRAOCULAR LENS PLACEMENT ([place]) diabetic Left;  Surgeon: Leandrew Koyanagi, MD;  Location: [place];  Service: Ophthalmology;  Laterality: Left;  Diabetic  . CESAREAN SECTION  1990  . CHOLECYSTECTOMY    . FOOT SURGERY    . GASTRIC BYPASS    . IR NEPHROSTOMY PLACEMENT LEFT  08/17/2017  . KNEE ARTHROSCOPY Left   . KNEE ARTHROSCOPY Left 07/19/2015   Procedure: ARTHROSCOPY KNEE;  Surgeon: Leanor Kail, MD;  Location: [facility];  Service: Orthopedics;  Laterality: Left;  . NEPHROLITHOTOMY Left 08/17/2017   Procedure: NEPHROLITHOTOMY PERCUTANEOUS WITH HOLMIUM LASER;  Surgeon: Hollice Espy, MD;  Location: [facility];  Service: Urology;  Laterality: Left;  . THUMB ARTHROSCOPY    . TONSILLECTOMY      Prior to Admission medications   Medication Sig Start Date End Date Taking? Authorizing Provider  aspirin 81 MG tablet Take 81 mg by mouth daily.    [provider]  atomoxetine (STRATTERA) 40 MG capsule Take 1 capsule (40 mg total) by mouth 2 (two) times daily with a meal. Patient taking differently: Take 80 mg by mouth daily.  07/30/17   Kathrine Haddock, NP  atorvastatin (LIPITOR) 20 MG tablet Take 20 mg by mouth daily. 07/06/17   [provider]  B Complex Vitamins (VITAMIN B COMPLEX PO) Take 1 tablet by mouth daily.    [provider]  Biotin 5000 MCG CAPS Take 5,000 mcg by mouth daily.    [provider]  [facility]  PO Take 3 tablets by mouth at bedtime.     [provider]  Cyanocobalamin (B-12) 1000 MCG/ML KIT Inject 1 Dose as directed every 14 (fourteen) days.     [provider]  docusate sodium (COLACE) 100 MG capsule Take 1 capsule (100 mg total) by mouth 2 (two) times daily. 08/18/17   Hollice Espy, MD  escitalopram (LEXAPRO) 20 MG tablet Take 40 mg by mouth daily.     [provider]  ferrous sulfate 325 (65 FE) MG tablet Take 325 mg by mouth at bedtime.    [provider]  gabapentin (NEURONTIN) 300 MG capsule Take 300 mg by mouth at bedtime as needed (nerve  pain).  07/06/17   [provider]  levothyroxine (SYNTHROID, LEVOTHROID) 100 MCG tablet Take 100 mcg by mouth daily before breakfast.    [provider]  lisinopril (PRINIVIL,ZESTRIL) 20 MG tablet Take 20 mg by mouth daily as needed (high blood pressure).     [provider]  mupirocin ointment (BACTROBAN) 2 % Place 1 application into the nose 2 (two) times daily. 08/12/17   Hollice Espy, MD  naproxen sodium (ANAPROX) 220 MG tablet Take 440 mg by mouth as needed (pain).    [provider]  Omega-3 Fatty Acids (OMEGA 3 PO) Take 3 tablets by mouth every evening.     [provider]  oxybutynin (DITROPAN) 5 MG tablet Take 1 tablet (5 mg total) by mouth every 8 (eight) hours as needed for bladder spasms. 08/18/17   Hollice Espy, MD  oxyCODONE-acetaminophen (PERCOCET) 5-325 MG tablet Take 1-2 tablets by mouth every 4 (four) hours as needed for moderate pain or severe pain. 08/18/17   Hollice Espy, MD  pramipexole (MIRAPEX) 0.5 MG tablet Take 0.5 mg by mouth at bedtime. May take an additional 0.5 mg for restless legs    [provider]  TRULICITY 8.31 DV/7.6HY SOPN Inject 0.75 mg into the skin every Thursday. 07/12/17   [provider]  Turmeric Curcumin 500 MG CAPS Take 1,000 mg by mouth at bedtime.     [provider]    Allergies Sulfasalazine; Nsaids; Sulfa antibiotics; and Penicillins  Family History  Problem Relation Age of Onset  . Cancer Mother        breast  . Heart disease Father   . Diabetes Father   . Hypertension Father   . Cancer Maternal Grandmother   . Cancer Maternal Grandfather   . Heart disease Paternal Grandmother   . Heart disease Paternal Grandfather   . Cancer Maternal Aunt   . Cancer Maternal Uncle   . Heart disease Paternal Aunt   . Heart disease Paternal Uncle   . Kidney cancer Neg Hx   . Bladder Cancer Neg Hx     Social History Social History  Substance Use Topics  . Smoking status:  Former Smoker    Packs/day: 0.50    Types: Cigarettes    Quit date: 04/14/2015  . Smokeless tobacco: Never Used  . Alcohol use No    Review of Systems  Constitutional: No fever/chills Eyes: No visual changes. ENT: No sore throat. Cardiovascular: Denies chest pain. Respiratory: Denies shortness of breath. Gastrointestinal:  No nausea, no vomiting.  No diarrhea.  No constipation. Genitourinary: Negative for dysuria. Musculoskeletal: as above Skin: Negative for rash. Neurological: Negative for headaches, focal weakness or numbness.   ____________________________________________   PHYSICAL EXAM:  VITAL SIGNS: ED Triage Vitals  Enc Vitals Group     BP 08/19/17  2112 127/65     Pulse Rate 08/19/17 2112 84     Resp 08/19/17 2112 12     Temp 08/19/17 2112 98.7 F (37.1 C)     Temp Source 08/19/17 2112 Oral     SpO2 08/19/17 2112 98 %     Weight 08/19/17 2116 184 lb (83.5 kg)     Height 08/19/17 2116 _0  (1.626 m)     Head Circumference --      Peak Flow --      Pain Score 08/19/17 2116 7     Pain Loc --      Pain Edu? --      Excl. in [place]? --     Constitutional: Alert and oriented. patient appears uncomfortable. Eyes: Conjunctivae are normal.  Head: Atraumatic. Nose: No congestion/rhinnorhea. Mouth/Throat: Mucous membranes are moist.  Neck: No stridor.   Cardiovascular: Normal rate, regular rhythm. Grossly normal heart sounds.  Respiratory: Normal respiratory effort.  No retractions. Lungs CTAB. Gastrointestinal: Soft and with moderate left lower quadrant tenderness to palpation. No distention. positive left-sided CVA tenderness palpation. Musculoskeletal: No lower extremity tenderness nor edema.  No joint effusions. Neurologic:  Normal speech and language. No gross focal neurologic deficits are appreciated. Skin:  Skin is warm, dry and intact. No rash noted. Psychiatric: Mood and affect are normal. Speech and behavior are  normal.  ____________________________________________   LABS (all labs ordered are listed, but only abnormal results are displayed)  Labs Reviewed  CBC WITH DIFFERENTIAL/PLATELET  BASIC METABOLIC PANEL  URINALYSIS, COMPLETE (UACMP) WITH MICROSCOPIC   ____________________________________________  EKG   ____________________________________________  RADIOLOGY  IMPRESSION: 1. Interim placement of a left ureteral stent. Development of mild left hydronephrosis and left perinephric fat stranding. Previously noted large staghorn calculus on the left is not seen, now visualized are multiple small stones in the lower pole of left kidney. There are multiple small stones visualized along the course of the left ureteral stent with longer segment of increased density along the distal third of the stent which may relate to multiple ureteral stones. 2. Small amount of free fluid in the pelvis 3. Status post gastric bypass with no evidence for obstruction. Large volume of stool in the colon. Normal appendix   Electronically Signed By: Donavan Foil M.D. On: 08/19/2017 23:09            ____________________________________________   PROCEDURES  Procedure(s) performed:   Procedures  Critical Care performed:   ____________________________________________   INITIAL IMPRESSION / ASSESSMENT AND PLAN / ED COURSE  Pertinent labs & imaging results that were available during my care of the patient were reviewed by me and considered in my medical decision making (see chart for details).  ----------------------------------------- 11:31 PM on 08/19/2017 -----------------------------------------  I discussed case with Dr. Pilar Jarvis of urology who reviewed the images and says that these are normal postop findings. Patient pending labs at this time.  most likely spasm from the stent placement. urology recommends discharge and 5 mg 3 times a day.  pending labs at this time. Signed  out to Dr. Owens Shark.      ____________________________________________   FINAL CLINICAL IMPRESSION(S) / ED DIAGNOSES  left flank pain.    NEW MEDICATIONS STARTED DURING THIS VISIT:  New Prescriptions   No medications on file     Note:  This document was prepared using Dragon voice recognition software and may include unintentional dictation errors.     Orbie Pyo, MD 08/19/17 [phone]

## 2017-08-19 NOTE — ED Triage Notes (Signed)
Pt to triage via w/c, appears uncomfortable; st since 730pm having pain to left side, lower abd; st had 3-4cm stone on left, surg performed on Tuesday

## 2017-08-19 NOTE — ED Notes (Signed)
Pt states she had kidney surgery on Tuesday to remove a stone. She just started having horrible pain today in her lower right abdominal and radiates to her back. Family at bedside

## 2017-08-20 LAB — BASIC METABOLIC PANEL
Anion gap: 10 (ref 5–15)
BUN: 11 mg/dL (ref 6–20)
CHLORIDE: 104 mmol/L (ref 101–111)
CO2: 26 mmol/L (ref 22–32)
Calcium: 8.8 mg/dL — ABNORMAL LOW (ref 8.9–10.3)
Creatinine, Ser: 0.82 mg/dL (ref 0.44–1.00)
Glucose, Bld: 105 mg/dL — ABNORMAL HIGH (ref 65–99)
POTASSIUM: 3.7 mmol/L (ref 3.5–5.1)
SODIUM: 140 mmol/L (ref 135–145)

## 2017-08-20 LAB — STONE ANALYSIS
CA PHOS CRY STONE QL IR: 30 %
Ca Oxalate,Monohydr.: 70 %
STONE WEIGHT KSTONE: 735.4 mg

## 2017-08-20 MED ORDER — OXYCODONE HCL 5 MG PO TABS
15.0000 mg | ORAL_TABLET | Freq: Once | ORAL | Status: AC
  Administered 2017-08-20: 15 mg via ORAL
  Filled 2017-08-20: qty 3

## 2017-08-20 NOTE — ED Provider Notes (Signed)
I assumed care of the patient from Dr. Dineen Kid at 11:00 PM. On assessment patient states her current pain score is 4 out of 10. Patient be given oxycodone by mouth patient requesting to be discharged at this time.   Gregor Hams, MD 08/20/17 [phone]

## 2017-08-26 ENCOUNTER — Ambulatory Visit (INDEPENDENT_AMBULATORY_CARE_PROVIDER_SITE_OTHER): Payer: BC Managed Care – PPO

## 2017-08-26 ENCOUNTER — Telehealth: Payer: Self-pay

## 2017-08-26 VITALS — BP 152/88 | HR 94 | Ht 64.0 in | Wt 184.0 lb

## 2017-08-26 DIAGNOSIS — N200 Calculus of kidney: Secondary | ICD-10-CM

## 2017-08-26 LAB — MICROSCOPIC EXAMINATION: RBC, UA: >30 /hpf — ABNORMAL HIGH (ref 0–?)

## 2017-08-26 LAB — URINALYSIS, COMPLETE
BILIRUBIN UA: NEGATIVE
GLUCOSE, UA: NEGATIVE
Nitrite, UA: NEGATIVE
PH UA: 6 (ref 5.0–7.5)
Urobilinogen, Ur: 0.2 mg/dL (ref 0.2–1.0)

## 2017-08-26 MED ORDER — OXYCODONE-ACETAMINOPHEN 5-325 MG PO TABS: 1.0000 | ORAL_TABLET | ORAL | 0 refills | Status: DC | PRN

## 2017-08-26 MED ORDER — TAMSULOSIN HCL 0.4 MG PO CAPS: 0.4000 mg | ORAL_CAPSULE | Freq: Every day | ORAL | 0 refills | Status: DC

## 2017-08-26 NOTE — Telephone Encounter (Signed)
Pt called stating last night was a really bad night in reference to pain. Pt stated that she took her oxybutynin and got a little bit of relief. Pt also stated that she is out of percocet so she only took ibuprofen but has to limit ibuprofen due to gastric bypass. Reinforced with pt to increase fluid in take and can alternate q4h tylenol and ibuprofen. Pt stated that she a friend told her flomax can help with stent pain. Pt requested some. Reinforced with pt it does but would need approval before giving rx. Please advise.

## 2017-08-26 NOTE — Telephone Encounter (Signed)
Yes, please prescribe Flomax dispensed #30.  She can have an additional 10 tabs of Percocet.  Instead of the CT scan, we'll get a KUB as she already got a CT scan on 08/19/2017.  Also, have her come in to be checked for UTI.   Hollice Espy, MD

## 2017-08-26 NOTE — Addendum Note (Signed)
Addended by: Lestine Box on: 08/26/2017 04:56 PM   Modules accepted: Orders

## 2017-08-26 NOTE — Progress Notes (Signed)
Pt in office with c/o UTI sx(s):  Back/flank pain Lower abdominal pain  Pt has not been on antibiotics in the last 30 days. Pt is not on suppressive antibiotics. Pt has had urological surgery in the last 30 days.  Vitals:   08/26/17 1618  BP: (!) 152/88  Pulse: 94  Weight: 184 lb (83.5 kg)  Height: 5\' 4"  (1.626 m)       Pt provided urine sample, advised pt on u/a and ucx turn-around times, and informed will be contacted when results reviewed by provider. Gave oxycodone Rx.  Pt verbalized understanding.

## 2017-08-26 NOTE — Telephone Encounter (Signed)
Spoke with pt in reference to flomax and percocet. Made pt aware will need to pick up script for percocet and while picking up script can have urine checked for UTI. Pt voiced understanding. Judson Roch will make pt aware of KUB at nurse visit. Orders placed.

## 2017-08-29 LAB — CULTURE, URINE COMPREHENSIVE

## 2017-08-31 ENCOUNTER — Ambulatory Visit
Admission: RE | Admit: 2017-08-31 | Discharge: 2017-08-31 | Disposition: A | Discharge status: Home / Self Care | Payer: BC Managed Care – PPO | Source: Ambulatory Visit | Attending: Urology | Admitting: Urology

## 2017-08-31 DIAGNOSIS — Z9884 Bariatric surgery status: Secondary | ICD-10-CM | POA: Insufficient documentation

## 2017-08-31 DIAGNOSIS — Z90710 Acquired absence of both cervix and uterus: Secondary | ICD-10-CM | POA: Insufficient documentation

## 2017-08-31 DIAGNOSIS — N132 Hydronephrosis with renal and ureteral calculous obstruction: Secondary | ICD-10-CM | POA: Diagnosis not present

## 2017-08-31 DIAGNOSIS — Z9049 Acquired absence of other specified parts of digestive tract: Secondary | ICD-10-CM | POA: Diagnosis not present

## 2017-08-31 DIAGNOSIS — M5136 Other intervertebral disc degeneration, lumbar region: Secondary | ICD-10-CM | POA: Insufficient documentation

## 2017-08-31 DIAGNOSIS — N200 Calculus of kidney: Secondary | ICD-10-CM | POA: Diagnosis present

## 2017-09-01 ENCOUNTER — Ambulatory Visit (INDEPENDENT_AMBULATORY_CARE_PROVIDER_SITE_OTHER): Payer: BC Managed Care – PPO | Admitting: Urology

## 2017-09-01 ENCOUNTER — Ambulatory Visit: Payer: BC Managed Care – PPO | Admitting: Urology

## 2017-09-01 VITALS — BP 103/70 | HR 99 | Ht 64.0 in | Wt 183.0 lb

## 2017-09-01 DIAGNOSIS — N200 Calculus of kidney: Secondary | ICD-10-CM

## 2017-09-01 LAB — MICROSCOPIC EXAMINATION

## 2017-09-01 LAB — URINALYSIS, COMPLETE
Bilirubin, UA: NEGATIVE
GLUCOSE, UA: NEGATIVE
NITRITE UA: NEGATIVE
Specific Gravity, UA: 1.025 (ref 1.005–1.030)
UUROB: 0.2 mg/dL (ref 0.2–1.0)
pH, UA: 6.5 (ref 5.0–7.5)

## 2017-09-01 NOTE — Progress Notes (Signed)
09/01/2017 2:55 PM   Courtney Murphy [birth year] [id number]  Referring provider: Audelia Acton, MD No address on file  Chief Complaint  Patient presents with  . Routine Post Op    HPI: 61 year old female with a 3 x 1.1 cm left renal stone) was left PCNL on 08/18/2017. Her surgery was uncomplicated and she was discharged home on postop day 1. She was seen in the emergency room the following day for stent pain but had no significant issues since then. She does have continued episodes of gross hematuria with ambulating and frequency but this is also improving. No fevers or chills. Her incision is now well-healed.  Intraoperatively, she was noted to have multiple tiny spherical stones which tracks like gravel along the length of the ureter. An antegrade ureteral stent was placed.  Follow-up CT scan today shows residual stone primarily at the level of the iliacs and distal to this alongside the stone. She's not had significant interval passage of the small fragments. She also has a few small nonobstructing calculi in the left lower pole.   PMH: Past Medical History:  Diagnosis Date  . Acid reflux 10/11/2015  . Anemia   . Depression   . Diabetes mellitus without complication ([place])   . Fatty liver   . Gout   . History of kidney stones   . Hypertension   . Hypothyroidism   . Kidney stone   . Knee pain    Left  . Pulmonary emboli ([place])   . Restless leg syndrome   . Shoulder pain     Surgical History: Past Surgical History:  Procedure Laterality Date  . ABDOMINAL HYSTERECTOMY    . BACK SURGERY    . BREAST REDUCTION SURGERY    . CATARACT EXTRACTION W/PHACO Left 07/28/2017   Procedure: CATARACT EXTRACTION PHACO AND INTRAOCULAR LENS PLACEMENT ([place]) diabetic Left;  Surgeon: Leandrew Koyanagi, MD;  Location: [place];  Service: Ophthalmology;  Laterality: Left;  Diabetic  . CESAREAN SECTION  1990  . CHOLECYSTECTOMY    . FOOT SURGERY    . GASTRIC BYPASS    .  IR NEPHROSTOMY PLACEMENT LEFT  08/17/2017  . KNEE ARTHROSCOPY Left   . KNEE ARTHROSCOPY Left 07/19/2015   Procedure: ARTHROSCOPY KNEE;  Surgeon: Leanor Kail, MD;  Location: [facility];  Service: Orthopedics;  Laterality: Left;  . NEPHROLITHOTOMY Left 08/17/2017   Procedure: NEPHROLITHOTOMY PERCUTANEOUS WITH HOLMIUM LASER;  Surgeon: Hollice Espy, MD;  Location: [facility];  Service: Urology;  Laterality: Left;  . THUMB ARTHROSCOPY    . TONSILLECTOMY      Home Medications:  Allergies as of 09/01/2017      Reactions   Sulfasalazine Hives   Nsaids    Avoids because of gastric bypass   Sulfa Antibiotics Hives   Penicillins Hives, Rash   Has patient had a PCN reaction causing immediate rash, facial/tongue/throat swelling, SOB or lightheadedness with hypotension: No Has patient had a PCN reaction causing severe rash involving mucus membranes or skin necrosis: No Has patient had a PCN reaction that required hospitalization: No Has patient had a PCN reaction occurring within the last 10 years: No If all of the above answers are "NO", then may proceed with Cephalosporin use.      Medication List       Accurate as of 09/01/17  2:55 PM. Always use your most recent med list.          aspirin 81 MG tablet Take 81 mg by mouth daily.  atomoxetine 40 MG capsule Commonly known as:  STRATTERA Take 1 capsule (40 mg total) by mouth 2 (two) times daily with a meal.   atorvastatin 20 MG tablet Commonly known as:  LIPITOR Take 20 mg by mouth daily.   B-12 1000 MCG/ML Kit Inject 1 Dose as directed every 14 (fourteen) days.   Biotin 5000 MCG Caps Take 5,000 mcg by mouth daily.   CALCIUM-MAGNESIUM-ZINC PO Take 3 tablets by mouth at bedtime.   docusate sodium 100 MG capsule Commonly known as:  COLACE Take 1 capsule (100 mg total) by mouth 2 (two) times daily.   escitalopram 20 MG tablet Commonly known as:  LEXAPRO Take 40 mg by mouth daily.   ferrous sulfate 325 (65 FE) MG  tablet Take 325 mg by mouth at bedtime.   gabapentin 300 MG capsule Commonly known as:  NEURONTIN Take 300 mg by mouth at bedtime as needed (nerve pain).   levothyroxine 100 MCG tablet Commonly known as:  SYNTHROID, LEVOTHROID Take 100 mcg by mouth daily before breakfast.   lisinopril 20 MG tablet Commonly known as:  PRINIVIL,ZESTRIL Take 20 mg by mouth daily as needed (high blood pressure).   naproxen sodium 220 MG tablet Commonly known as:  ANAPROX Take 440 mg by mouth as needed (pain).   OMEGA 3 PO Take 3 tablets by mouth every evening.   oxybutynin 5 MG tablet Commonly known as:  DITROPAN Take 1 tablet (5 mg total) by mouth every 8 (eight) hours as needed for bladder spasms.   oxyCODONE-acetaminophen 5-325 MG tablet Commonly known as:  PERCOCET Take 1-2 tablets by mouth every 4 (four) hours as needed for moderate pain or severe pain.   pramipexole 0.5 MG tablet Commonly known as:  MIRAPEX Take 0.5 mg by mouth at bedtime. May take an additional 0.5 mg for restless legs   tamsulosin 0.4 MG Caps capsule Commonly known as:  FLOMAX Take 1 capsule (0.4 mg total) by mouth daily.   TRULICITY 0.75 MG/0.5ML Sopn Generic drug:  Dulaglutide Inject 0.75 mg into the skin every Thursday.   Turmeric Curcumin 500 MG Caps Take 1,000 mg by mouth at bedtime.   VITAMIN B COMPLEX PO Take 1 tablet by mouth daily.       Allergies:  Allergies  Allergen Reactions  . Sulfasalazine Hives  . 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 Sulfa Antibiotics Hives  . Penicillins Hives and Rash    Has patient had a PCN reaction causing immediate rash, facial/tongue/throat swelling, SOB or lightheadedness with hypotension: No Has patient had a PCN reaction causing severe rash involving mucus membranes or skin necrosis: No Has patient had a PCN reaction that required hospitalization: No Has patient had a PCN reaction occurring within the last 10 years: No If all of the above answers  are "NO", then may proceed with Cephalosporin use.     Family History: Family History  Problem Relation Age of Onset  . Cancer Mother        breast  . Heart disease Father   . Diabetes Father   . Hypertension Father   . Cancer Maternal Grandmother   . Cancer Maternal Grandfather   . Heart disease Paternal Grandmother   . Heart disease Paternal Grandfather   . Cancer Maternal Aunt   . Cancer Maternal Uncle   . Heart disease Paternal Aunt   . Heart disease Paternal Uncle   . Kidney cancer Neg Hx   . Bladder Cancer Neg   Hx     Social History:  reports that she quit smoking about 2 years ago. Her smoking use included Cigarettes. She smoked 0.50 packs per day. She has never used smokeless tobacco. She reports that she does not drink alcohol or use drugs.  ROS: UROLOGY Frequent Urination?: No Hard to postpone urination?: No Burning/pain with urination?: No Get up at night to urinate?: Yes Leakage of urine?: No Urine stream starts and stops?: No Trouble starting stream?: No Do you have to strain to urinate?: No Blood in urine?: Yes Urinary tract infection?: No Sexually transmitted disease?: No Injury to kidneys or bladder?: No Painful intercourse?: No Weak stream?: No Currently pregnant?: No Vaginal bleeding?: No Last menstrual period?: n  Gastrointestinal Nausea?: No Vomiting?: No Indigestion/heartburn?: No Diarrhea?: No Constipation?: No  Constitutional Fever: No Night sweats?: No Weight loss?: No Fatigue?: No  Skin Skin rash/lesions?: No Itching?: No  Eyes Blurred vision?: No Double vision?: No  Ears/Nose/Throat Sore throat?: No Sinus problems?: No  Hematologic/Lymphatic Swollen glands?: No Easy bruising?: No  Cardiovascular Leg swelling?: No Chest pain?: No  Respiratory Cough?: No Shortness of breath?: No  Endocrine Excessive thirst?: No  Musculoskeletal Back pain?: Yes Joint pain?: No  Neurological Headaches?: No Dizziness?:  No  Psychologic Depression?: No Anxiety?: No  Physical Exam: BP 103/70   Pulse 99   Ht 5' 4" (1.626 m)   Wt 183 lb (83 kg)   BMI 31.41 kg/m   Constitutional:  Alert and oriented, No acute distress. HEENT: [place] AT, moist mucus membranes.  Trachea midline, no masses. Cardiovascular: No clubbing, cyanosis, or edema. Respiratory: Normal respiratory effort, no increased work of breathing. GI: Abdomen is soft, nontender, nondistended, no abdominal masses GU: No CVA tenderness. Left flank incision well-healed. Skin: No rashes, bruises or suspicious lesions. Neurologic: Grossly intact, no focal deficits, moving all 4 extremities. Psychiatric: Normal mood and affect.  Laboratory Data: Lab Results  Component Value Date   WBC 10.8 08/19/2017   HGB 11.2 (L) 08/19/2017   HCT 32.4 (L) 08/19/2017   MCV 90.1 08/19/2017   PLT 232 08/19/2017    Lab Results  Component Value Date   CREATININE 0.82 08/19/2017    Urinalysis Lab Results  Component Value Date   SPECGRAV >1.030 (H) 08/26/2017   PHUR 6.0 08/26/2017   COLORU Yellow 08/26/2017   APPEARANCEUR Cloudy (A) 08/26/2017   LEUKOCYTESUR 1+ (A) 08/26/2017   PROTEINUR 3+ (A) 08/26/2017   GLUCOSEU Negative 08/26/2017   KETONESU Trace (A) 08/26/2017   RBCU 3+ (A) 08/26/2017   BILIRUBINUR Negative 08/26/2017   UUROB 0.2 08/26/2017   NITRITE Negative 08/26/2017    Lab Results  Component Value Date   LABMICR See below: 08/26/2017   WBCUA 11-30 (A) 08/26/2017   RBCUA >30 (H) 08/26/2017   LABEPIT 0-10 08/26/2017   BACTERIA Many (A) 08/26/2017    Pertinent Imaging:  Results for orders placed during the hospital encounter of 08/31/17  CT RENAL STONE STUDY   Narrative CLINICAL DATA:  Left-sided nephrolithiasis.  EXAM: CT ABDOMEN AND PELVIS WITHOUT CONTRAST  TECHNIQUE: Multidetector CT imaging of the abdomen and pelvis was performed following the standard protocol without IV contrast.  COMPARISON:   08/19/2017  FINDINGS: Lower chest: No acute abnormality.  Hepatobiliary: No focal liver abnormality is seen. Status post cholecystectomy. No biliary dilatation.  Pancreas: Unremarkable. No pancreatic ductal dilatation or surrounding inflammatory changes.  Spleen: Normal in size without focal abnormality.  Adrenals/Urinary Tract: The adrenal glands are normal. The right kidney is   unremarkable. No mass or hydronephrosis. There is left-sided pelvocaliectasis which appears similar to previous exam. A double-J nephroureteral stent is identified calcifications within the left mid ureter are identified, image 58 of series 2 through image 67 of series 2. No stones identified within the urinary bladder.  Stomach/Bowel: Postoperative changes from gastric bypass surgery identified. No dilated loops of small bowel identified. The appendix is visualized and appears normal. No pathologic dilatation of the colon.  Vascular/Lymphatic: Normal appearance of the abdominal aorta. No enlarged retroperitoneal or mesenteric adenopathy. No enlarged pelvic or inguinal lymph nodes.  Reproductive: Status post hysterectomy. No adnexal masses.  Other: No abdominal wall hernia or abnormality. No abdominopelvic ascites.  Musculoskeletal: There is degenerative disc disease identified within the lumbar spine. No aggressive lytic or sclerotic bone lesions.  IMPRESSION: 1. Stable position of left-sided double-J nephroureteral stent. 2. Again noted are multiple stones within the left mid ureter. Persistent mild left-sided hydronephrosis.   Electronically Signed   By: Taylor  Stroud M.D.   On: 09/01/2017 08:58    CT scan personally reviewed today.  Assessment & Plan:    1. Left nephrolithiasis S/p left PCNL with small amount of left lower pole residual stone burden as well as surgery along the length of the ureter beside the stent. Given its been several weeks since surgery without significant  passage of the stone debris, I recommended returning to the operating room for left ureteroscopy, laser lithotripsy to clear residual stone burden.  Risks and benefits of ureteroscopy were reviewed including but not limited to infection, bleeding, pain, ureteral injury which could require open surgery versus prolonged indwelling if ureteralperforation occurs, persistent stone disease, requirement for staged procedure, possible stent, and global anesthesia risks. Patient expressed understanding and desires to proceed with ureteroscopy.  Preop UA/UX  - Urinalysis, Complete - CULTURE, URINE COMPREHENSIVE  Schedule left URS, LL, stent exchange next week  Ashley Brandon, MD  [facility] [address], [address] [place], [place] [address] [phone]  

## 2017-09-03 ENCOUNTER — Other Ambulatory Visit: Payer: Self-pay | Admitting: Radiology

## 2017-09-03 DIAGNOSIS — N200 Calculus of kidney: Secondary | ICD-10-CM

## 2017-09-06 ENCOUNTER — Encounter
Admission: RE | Admit: 2017-09-06 | Discharge: 2017-09-06 | Disposition: A | Discharge status: Home / Self Care | Payer: BC Managed Care – PPO | Source: Ambulatory Visit | Attending: Urology | Admitting: Urology

## 2017-09-06 HISTORY — DX: Unspecified osteoarthritis, unspecified site: M19.90

## 2017-09-06 LAB — CULTURE, URINE COMPREHENSIVE

## 2017-09-06 NOTE — Patient Instructions (Signed)
Your procedure is scheduled on: 09/08/17 Wed Report to Same Day Surgery 2nd floor medical mall [facility] Entrance-take elevator on left to 2nd floor.  Check in with surgery information desk.) To find out your arrival time please call [phone] between 1PM - 3PM on 09/07/17 Tues  Remember: Instructions that are not followed completely may result in serious medical risk, up to and including death, or upon the discretion of your surgeon and anesthesiologist your surgery may need to be rescheduled.    _x___ 1. Do not eat food after midnight the night before your procedure. You may drink clear liquids up to 2 hours before you are scheduled to arrive at the hospital for your procedure.  Do not drink clear liquids within 2 hours of your scheduled arrival to the hospital.  Clear liquids include  --Water or Apple juice without pulp  --Clear carbohydrate beverage such as ClearFast or Gatorade  --Black Coffee or Clear Tea (No milk, no creamers, do not add anything to                  the coffee or Tea Type 1 and type 2 diabetics should only drink water.  No gum chewing or hard candies.     __x__ 2. No Alcohol for 24 hours before or after surgery.   __x__3. No Smoking for 24 prior to surgery.   ____  4. Bring all medications with you on the day of surgery if instructed.    __x__ 5. Notify your doctor if there is any change in your medical condition     (cold, fever, infections).     Do not wear jewelry, make-up, hairpins, clips or nail polish.  Do not wear lotions, powders, or perfumes. You may wear deodorant.  Do not shave 48 hours prior to surgery. Men may shave face and neck.  Do not bring valuables to the hospital.    [facility] is not responsible for any belongings or valuables.               Contacts, dentures or bridgework may not be worn into surgery.  Leave your suitcase in the car. After surgery it may be brought to your room.  For patients admitted to the hospital, discharge  time is determined by your                       treatment team.   Patients discharged the day of surgery will not be allowed to drive home.  You will need someone to drive you home and stay with you the night of your procedure.    Please read over the following fact sheets that you were given:   [facility] Preparing for Surgery and or MRSA Information   _x___ Take anti-hypertensive listed below, cardiac, seizure, asthma,     anti-reflux and psychiatric medicines. These include:  1. atomoxetine (STRATTERA) 40 MG capsule  2.escitalopram (LEXAPRO) 20   3.levothyroxine (SYNTHROID, LEVOTHROID) 100 MCG tablet  4.  5.  6.  ____Fleets enema or Magnesium Citrate as directed.   _x___ Use CHG Soap or sage wipes as directed on instruction sheet   ____ Use inhalers on the day of surgery and bring to hospital day of surgery  ____ Stop Metformin and Janumet 2 days prior to surgery.    ____ Take 1/2 of usual insulin dose the night before surgery and none on the morning     surgery.   _x___ Follow recommendations from Cardiologist,  Pulmonologist or PCP regarding          stopping Aspirin, Coumadin, Plavix ,Eliquis, Effient, or Pradaxa, and Pletal.  X____Stop Anti-inflammatories such as Advil, Aleve, Ibuprofen, Motrin, Naproxen, Naprosyn, Goodies powders or aspirin products. OK to take Tylenol and                          Celebrex.   _x___ Stop supplements until after surgery.  But may continue Vitamin D, Vitamin B,       and multivitamin.  Stop Omega 3, and Turmeric today.  ____ Bring C-Pap to the hospital.

## 2017-09-07 MED ORDER — CIPROFLOXACIN IN D5W 400 MG/200ML IV SOLN
400.0000 mg | INTRAVENOUS | Status: AC
  Administered 2017-09-08: 400 mg via INTRAVENOUS

## 2017-09-07 MED ORDER — GENTAMICIN IN SALINE 1.6-0.9 MG/ML-% IV SOLN
80.0000 mg | INTRAVENOUS | Status: AC
Start: 2017-09-07 — End: 2017-09-08
  Administered 2017-09-08: 80 mg via INTRAVENOUS
  Filled 2017-09-07: qty 50

## 2017-09-08 ENCOUNTER — Encounter
Admission: RE | Disposition: A | Discharge status: Home / Self Care | Payer: Self-pay | Source: Ambulatory Visit | Attending: Urology

## 2017-09-08 ENCOUNTER — Ambulatory Visit
Admission: RE | Admit: 2017-09-08 | Discharge: 2017-09-08 | Disposition: A | Discharge status: Home / Self Care | Payer: BC Managed Care – PPO | Source: Ambulatory Visit | Attending: Urology | Admitting: Urology

## 2017-09-08 ENCOUNTER — Ambulatory Visit: Payer: BC Managed Care – PPO | Admitting: Certified Registered"

## 2017-09-08 ENCOUNTER — Encounter: Payer: Self-pay | Admitting: *Deleted

## 2017-09-08 DIAGNOSIS — Z9884 Bariatric surgery status: Secondary | ICD-10-CM | POA: Insufficient documentation

## 2017-09-08 DIAGNOSIS — Z888 Allergy status to other drugs, medicaments and biological substances status: Secondary | ICD-10-CM | POA: Diagnosis not present

## 2017-09-08 DIAGNOSIS — Z8249 Family history of ischemic heart disease and other diseases of the circulatory system: Secondary | ICD-10-CM | POA: Insufficient documentation

## 2017-09-08 DIAGNOSIS — R310 Gross hematuria: Secondary | ICD-10-CM | POA: Insufficient documentation

## 2017-09-08 DIAGNOSIS — D649 Anemia, unspecified: Secondary | ICD-10-CM | POA: Diagnosis not present

## 2017-09-08 DIAGNOSIS — M109 Gout, unspecified: Secondary | ICD-10-CM | POA: Diagnosis not present

## 2017-09-08 DIAGNOSIS — Z803 Family history of malignant neoplasm of breast: Secondary | ICD-10-CM | POA: Diagnosis not present

## 2017-09-08 DIAGNOSIS — F329 Major depressive disorder, single episode, unspecified: Secondary | ICD-10-CM | POA: Diagnosis not present

## 2017-09-08 DIAGNOSIS — E039 Hypothyroidism, unspecified: Secondary | ICD-10-CM | POA: Diagnosis not present

## 2017-09-08 DIAGNOSIS — Z809 Family history of malignant neoplasm, unspecified: Secondary | ICD-10-CM | POA: Insufficient documentation

## 2017-09-08 DIAGNOSIS — Z90710 Acquired absence of both cervix and uterus: Secondary | ICD-10-CM | POA: Diagnosis not present

## 2017-09-08 DIAGNOSIS — Z9049 Acquired absence of other specified parts of digestive tract: Secondary | ICD-10-CM | POA: Diagnosis not present

## 2017-09-08 DIAGNOSIS — Z7982 Long term (current) use of aspirin: Secondary | ICD-10-CM | POA: Insufficient documentation

## 2017-09-08 DIAGNOSIS — N132 Hydronephrosis with renal and ureteral calculous obstruction: Secondary | ICD-10-CM | POA: Diagnosis not present

## 2017-09-08 DIAGNOSIS — K760 Fatty (change of) liver, not elsewhere classified: Secondary | ICD-10-CM | POA: Diagnosis not present

## 2017-09-08 DIAGNOSIS — M25562 Pain in left knee: Secondary | ICD-10-CM | POA: Insufficient documentation

## 2017-09-08 DIAGNOSIS — Z882 Allergy status to sulfonamides status: Secondary | ICD-10-CM | POA: Diagnosis not present

## 2017-09-08 DIAGNOSIS — I10 Essential (primary) hypertension: Secondary | ICD-10-CM | POA: Insufficient documentation

## 2017-09-08 DIAGNOSIS — Z87891 Personal history of nicotine dependence: Secondary | ICD-10-CM | POA: Insufficient documentation

## 2017-09-08 DIAGNOSIS — N202 Calculus of kidney with calculus of ureter: Secondary | ICD-10-CM | POA: Diagnosis not present

## 2017-09-08 DIAGNOSIS — M25519 Pain in unspecified shoulder: Secondary | ICD-10-CM | POA: Diagnosis not present

## 2017-09-08 DIAGNOSIS — E119 Type 2 diabetes mellitus without complications: Secondary | ICD-10-CM | POA: Diagnosis not present

## 2017-09-08 DIAGNOSIS — Z79899 Other long term (current) drug therapy: Secondary | ICD-10-CM | POA: Insufficient documentation

## 2017-09-08 DIAGNOSIS — Z9842 Cataract extraction status, left eye: Secondary | ICD-10-CM | POA: Diagnosis not present

## 2017-09-08 DIAGNOSIS — Z86711 Personal history of pulmonary embolism: Secondary | ICD-10-CM | POA: Insufficient documentation

## 2017-09-08 DIAGNOSIS — Z87442 Personal history of urinary calculi: Secondary | ICD-10-CM | POA: Diagnosis not present

## 2017-09-08 DIAGNOSIS — G2581 Restless legs syndrome: Secondary | ICD-10-CM | POA: Diagnosis not present

## 2017-09-08 DIAGNOSIS — Z880 Allergy status to penicillin: Secondary | ICD-10-CM | POA: Insufficient documentation

## 2017-09-08 DIAGNOSIS — K219 Gastro-esophageal reflux disease without esophagitis: Secondary | ICD-10-CM | POA: Diagnosis not present

## 2017-09-08 DIAGNOSIS — N200 Calculus of kidney: Secondary | ICD-10-CM

## 2017-09-08 DIAGNOSIS — Z833 Family history of diabetes mellitus: Secondary | ICD-10-CM | POA: Insufficient documentation

## 2017-09-08 HISTORY — PX: CYSTOSCOPY/URETEROSCOPY/HOLMIUM LASER/STENT PLACEMENT: SHX6546

## 2017-09-08 LAB — GLUCOSE, CAPILLARY
Glucose-Capillary: 97 mg/dL (ref 65–99)
Glucose-Capillary: 107 mg/dL — ABNORMAL HIGH (ref 65–99)

## 2017-09-08 SURGERY — CYSTOSCOPY/URETEROSCOPY/HOLMIUM LASER/STENT PLACEMENT
Anesthesia: General | Site: Ureter | Laterality: Left | Wound class: Clean Contaminated

## 2017-09-08 MED ORDER — ONDANSETRON HCL 4 MG/2ML IJ SOLN
INTRAMUSCULAR | Status: AC
  Filled 2017-09-08: qty 2

## 2017-09-08 MED ORDER — PHENYLEPHRINE HCL 10 MG/ML IJ SOLN
INTRAMUSCULAR | Status: DC | PRN
  Administered 2017-09-08 (×3): 100 ug via INTRAVENOUS

## 2017-09-08 MED ORDER — FENTANYL CITRATE (PF) 100 MCG/2ML IJ SOLN
INTRAMUSCULAR | Status: AC
  Filled 2017-09-08: qty 2

## 2017-09-08 MED ORDER — PHENYLEPHRINE HCL 10 MG/ML IJ SOLN
INTRAMUSCULAR | Status: AC
  Filled 2017-09-08: qty 1

## 2017-09-08 MED ORDER — OXYCODONE HCL 5 MG/5ML PO SOLN: 5.0000 mg | Freq: Once | ORAL | Status: DC | PRN

## 2017-09-08 MED ORDER — ROCURONIUM BROMIDE 100 MG/10ML IV SOLN
INTRAVENOUS | Status: DC | PRN
  Administered 2017-09-08 (×2): 10 mg via INTRAVENOUS
  Administered 2017-09-08: 40 mg via INTRAVENOUS
  Administered 2017-09-08: 10 mg via INTRAVENOUS

## 2017-09-08 MED ORDER — OXYCODONE HCL 5 MG PO TABS: 5.0000 mg | ORAL_TABLET | Freq: Once | ORAL | Status: DC | PRN

## 2017-09-08 MED ORDER — DEXAMETHASONE SODIUM PHOSPHATE 10 MG/ML IJ SOLN
INTRAMUSCULAR | Status: DC | PRN
  Administered 2017-09-08: 10 mg via INTRAVENOUS

## 2017-09-08 MED ORDER — ROCURONIUM BROMIDE 50 MG/5ML IV SOLN
INTRAVENOUS | Status: AC
  Filled 2017-09-08: qty 1

## 2017-09-08 MED ORDER — ONDANSETRON HCL 4 MG/2ML IJ SOLN
INTRAMUSCULAR | Status: DC | PRN
Start: 2017-09-08 — End: 2017-09-08
  Administered 2017-09-08: 4 mg via INTRAVENOUS

## 2017-09-08 MED ORDER — FAMOTIDINE 20 MG PO TABS
ORAL_TABLET | ORAL | Status: AC
  Administered 2017-09-08: 20 mg via ORAL
  Filled 2017-09-08: qty 1

## 2017-09-08 MED ORDER — SUGAMMADEX SODIUM 500 MG/5ML IV SOLN
INTRAVENOUS | Status: DC | PRN
  Administered 2017-09-08: 175 mg via INTRAVENOUS

## 2017-09-08 MED ORDER — SODIUM CHLORIDE 0.9 % IV SOLN
INTRAVENOUS | Status: DC
  Administered 2017-09-08: 11:00:00 via INTRAVENOUS

## 2017-09-08 MED ORDER — DEXAMETHASONE SODIUM PHOSPHATE 10 MG/ML IJ SOLN
INTRAMUSCULAR | Status: AC
  Filled 2017-09-08: qty 1

## 2017-09-08 MED ORDER — FENTANYL CITRATE (PF) 100 MCG/2ML IJ SOLN
INTRAMUSCULAR | Status: DC | PRN
  Administered 2017-09-08 (×2): 50 ug via INTRAVENOUS

## 2017-09-08 MED ORDER — MIDAZOLAM HCL 2 MG/2ML IJ SOLN
INTRAMUSCULAR | Status: DC | PRN
  Administered 2017-09-08: 2 mg via INTRAVENOUS

## 2017-09-08 MED ORDER — PROPOFOL 10 MG/ML IV BOLUS
INTRAVENOUS | Status: DC | PRN
  Administered 2017-09-08: 150 mg via INTRAVENOUS

## 2017-09-08 MED ORDER — PROPOFOL 500 MG/50ML IV EMUL
INTRAVENOUS | Status: AC
  Filled 2017-09-08: qty 50

## 2017-09-08 MED ORDER — LIDOCAINE HCL 2 % IJ SOLN
INTRAMUSCULAR | Status: AC
  Filled 2017-09-08: qty 10

## 2017-09-08 MED ORDER — EPHEDRINE SULFATE 50 MG/ML IJ SOLN
INTRAMUSCULAR | Status: DC | PRN
  Administered 2017-09-08: 10 mg via INTRAVENOUS

## 2017-09-08 MED ORDER — FENTANYL CITRATE (PF) 100 MCG/2ML IJ SOLN: 25.0000 ug | INTRAMUSCULAR | Status: DC | PRN

## 2017-09-08 MED ORDER — EPHEDRINE SULFATE 50 MG/ML IJ SOLN
INTRAMUSCULAR | Status: AC
  Filled 2017-09-08: qty 1

## 2017-09-08 MED ORDER — OXYCODONE-ACETAMINOPHEN 5-325 MG PO TABS: 1.0000 | ORAL_TABLET | ORAL | 0 refills | Status: DC | PRN

## 2017-09-08 MED ORDER — IOTHALAMATE MEGLUMINE 43 % IV SOLN
INTRAVENOUS | Status: DC | PRN
  Administered 2017-09-08: 15 mL via URETHRAL

## 2017-09-08 MED ORDER — FAMOTIDINE 20 MG PO TABS
20.0000 mg | ORAL_TABLET | Freq: Once | ORAL | Status: AC
  Administered 2017-09-08: 20 mg via ORAL

## 2017-09-08 MED ORDER — MIDAZOLAM HCL 2 MG/2ML IJ SOLN
INTRAMUSCULAR | Status: AC
  Filled 2017-09-08: qty 2

## 2017-09-08 MED ORDER — LIDOCAINE HCL (CARDIAC) 20 MG/ML IV SOLN
INTRAVENOUS | Status: DC | PRN
  Administered 2017-09-08: 100 mg via INTRAVENOUS

## 2017-09-08 MED ORDER — CIPROFLOXACIN IN D5W 400 MG/200ML IV SOLN
INTRAVENOUS | Status: AC
  Filled 2017-09-08: qty 200

## 2017-09-08 SURGICAL SUPPLY — 30 items
BAG DRAIN CYSTO-URO LG1000N (MISCELLANEOUS) ×3 IMPLANT
BASKET PARACHUTE 3.1  ZE (MISCELLANEOUS) ×3 IMPLANT
BASKET ZERO TIP 1.9FR (BASKET) ×3 IMPLANT
BRUSH SCRUB EZ 1% IODOPHOR (MISCELLANEOUS) ×3 IMPLANT
CATH URETL 5X70 OPEN END (CATHETERS) ×3 IMPLANT
CNTNR SPEC 2.5X3XGRAD LEK (MISCELLANEOUS)
CONRAY 43 FOR UROLOGY 50M (MISCELLANEOUS) ×3 IMPLANT
CONT SPEC 4OZ STER OR WHT (MISCELLANEOUS)
CONTAINER SPEC 2.5X3XGRAD LEK (MISCELLANEOUS) IMPLANT
DRAPE UTILITY 15X26 TOWEL STRL (DRAPES) ×3 IMPLANT
FIBER LASER LITHO 273 (Laser) IMPLANT
GLOVE BIO SURGEON STRL SZ 6.5 (GLOVE) ×4 IMPLANT
GLOVE BIO SURGEONS STRL SZ 6.5 (GLOVE) ×2
GOWN STRL REUS W/ TWL LRG LVL3 (GOWN DISPOSABLE) ×2 IMPLANT
GOWN STRL REUS W/TWL LRG LVL3 (GOWN DISPOSABLE) ×4
GUIDEWIRE GREEN .038 145CM (MISCELLANEOUS) ×3 IMPLANT
INFUSOR MANOMETER BAG 3000ML (MISCELLANEOUS) ×3 IMPLANT
INTRODUCER DILATOR DOUBLE (INTRODUCER) IMPLANT
KIT RM TURNOVER CYSTO AR (KITS) ×3 IMPLANT
PACK CYSTO AR (MISCELLANEOUS) ×3 IMPLANT
SENSORWIRE 0.038 NOT ANGLED (WIRE) ×3
SET CYSTO W/LG BORE CLAMP LF (SET/KITS/TRAYS/PACK) ×3 IMPLANT
SHEATH URETERAL 12FRX35CM (MISCELLANEOUS) ×3 IMPLANT
SOL .9 NS 3000ML IRR  AL (IV SOLUTION) ×2
SOL .9 NS 3000ML IRR UROMATIC (IV SOLUTION) ×1 IMPLANT
STENT URET 6FRX24 CONTOUR (STENTS) ×3 IMPLANT
STENT URET 6FRX26 CONTOUR (STENTS) IMPLANT
SURGILUBE 2OZ TUBE FLIPTOP (MISCELLANEOUS) ×3 IMPLANT
WATER STERILE IRR 1000ML POUR (IV SOLUTION) ×3 IMPLANT
WIRE SENSOR 0.038 NOT ANGLED (WIRE) ×1 IMPLANT

## 2017-09-08 NOTE — Anesthesia Postprocedure Evaluation (Signed)
Anesthesia Post Note  Patient: Courtney Murphy  Procedure(s) Performed: CYSTOSCOPY/URETEROSCOPY/HOLMIUM LASER/STENT EXCHANGE (Left Ureter)  Patient location during evaluation: PACU Anesthesia Type: General Level of consciousness: awake and alert Pain management: pain level controlled Vital Signs Assessment: post-procedure vital signs reviewed and stable Respiratory status: spontaneous breathing, nonlabored ventilation, respiratory function stable and patient connected to nasal cannula oxygen Cardiovascular status: blood pressure returned to baseline and stable Postop Assessment: no apparent nausea or vomiting Anesthetic complications: no     Last Vitals:  Vitals:   09/08/17 1400 09/08/17 1415  BP: (!) 148/72 (!) 141/67  Pulse: 75 81  Resp: 18 18  Temp: (!) 36.3 C   SpO2: 98% 98%    Last Pain:  Vitals:   09/08/17 1400  TempSrc: Temporal                 Precious Haws Dov Dill

## 2017-09-08 NOTE — Anesthesia Preprocedure Evaluation (Signed)
Anesthesia Evaluation  Patient identified by MRN, date of birth, ID band Patient awake    Reviewed: Allergy & Precautions, H&P , NPO status , Patient's Chart, lab work & pertinent test results  History of Anesthesia Complications Negative for: history of anesthetic complications  Airway Mallampati: III  TM Distance: >3 FB Neck ROM: limited    Dental  (+) Poor Dentition, Chipped, Missing   Pulmonary neg shortness of breath, former smoker,           Cardiovascular hypertension, (-) angina(-) Past MI and (-) DOE      Neuro/Psych PSYCHIATRIC DISORDERS Depression negative neurological ROS     GI/Hepatic Neg liver ROS, GERD  Medicated and Controlled,  Endo/Other  negative endocrine ROSdiabetesHypothyroidism   Renal/GU Renal disease     Musculoskeletal  (+) Arthritis ,   Abdominal   Peds  Hematology negative hematology ROS (+)   Anesthesia Other Findings Past Medical History: 10/11/2015: Acid reflux No date: Anemia No date: Arthritis No date: Depression No date: Diabetes mellitus without complication ([facility]) No date: Fatty liver No date: Gout No date: History of kidney stones No date: Hypertension No date: Hypothyroidism No date: Kidney stone No date: Knee pain     Comment:  Left No date: Pulmonary emboli ([facility]) No date: Restless leg syndrome No date: Shoulder pain  Past Surgical History: No date: ABDOMINAL HYSTERECTOMY No date: BACK SURGERY No date: BREAST REDUCTION SURGERY 07/28/2017: CATARACT EXTRACTION W/PHACO; Left     Comment:  Procedure: CATARACT EXTRACTION PHACO AND INTRAOCULAR               LENS PLACEMENT ([place]) diabetic Left;  Surgeon: Leandrew Koyanagi, MD;  Location: [place];  Service:               Ophthalmology;  Laterality: Left;  Diabetic 1990: CESAREAN SECTION No date: CHOLECYSTECTOMY No date: FOOT SURGERY No date: GASTRIC BYPASS 08/17/2017: IR NEPHROSTOMY  PLACEMENT LEFT No date: KNEE ARTHROSCOPY; Left 07/19/2015: KNEE ARTHROSCOPY; Left     Comment:  Procedure: ARTHROSCOPY KNEE;  Surgeon: Leanor Kail,               MD;  Location: [facility];  Service: Orthopedics;                Laterality: Left; 08/17/2017: NEPHROLITHOTOMY; Left     Comment:  Procedure: NEPHROLITHOTOMY PERCUTANEOUS WITH HOLMIUM               LASER;  Surgeon: Hollice Espy, MD;  Location: [facility]               [facility];  Service: Urology;  Laterality: Left; No date: THUMB ARTHROSCOPY No date: TONSILLECTOMY     Reproductive/Obstetrics negative OB ROS                             Anesthesia Physical Anesthesia Plan  ASA: III  Anesthesia Plan: General   Post-op Pain Management:    Induction: Intravenous  PONV Risk Score and Plan: 4 or greater and Ondansetron, Dexamethasone and Treatment may vary due to age or medical condition  Airway Management Planned: Natural Airway and Nasal Cannula  Additional Equipment:   Intra-op Plan:   Post-operative Plan: Extubation in OR  Informed Consent: I have reviewed the patients History and Physical, chart, labs and discussed the procedure including the risks, benefits and alternatives for the proposed anesthesia  with the patient or authorized representative who has indicated his/her understanding and acceptance.   Dental Advisory Given  Plan Discussed with: Anesthesiologist, CRNA and Surgeon  Anesthesia Plan Comments: (Patient consented for risks of anesthesia including but not limited to:  - adverse reactions to medications - damage to teeth, lips or other oral mucosa - sore throat or hoarseness - Damage to heart, brain, lungs or loss of life  Patient voiced understanding.)        Anesthesia Quick Evaluation

## 2017-09-08 NOTE — Anesthesia Procedure Notes (Signed)
Procedure Name: Intubation Date/Time: 09/08/2017 11:20 AM Performed by: Doreen Salvage Pre-anesthesia Checklist: Patient identified, Patient being monitored, Timeout performed, Emergency Drugs available and Suction available Patient Re-evaluated:Patient Re-evaluated prior to induction Oxygen Delivery Method: Circle system utilized Preoxygenation: Pre-oxygenation with 100% oxygen Induction Type: IV induction Ventilation: Mask ventilation without difficulty Laryngoscope Size: Mac and 3 Grade View: Grade I Tube type: Oral Tube size: 7.0 mm Number of attempts: 1 Airway Equipment and Method: Stylet Placement Confirmation: ETT inserted through vocal cords under direct vision,  positive ETCO2 and breath sounds checked- equal and bilateral Secured at: 23 cm Tube secured with: Tape Dental Injury: Teeth and Oropharynx as per pre-operative assessment  Comments: Easy intubation. What appears to look like an ulcer is noted on epiglottis during DL.

## 2017-09-08 NOTE — Op Note (Signed)
Date of procedure: 09/08/17  Preoperative diagnosis:  1. Left kidney stones 2. Left ureteral calculi   Postoperative diagnosis:  1. Same as above   Procedure: 1. Left ureteroscopy with laser lithotripsy 2. Basket extraction of numerous ureteral kidney stones 3. Left retrograde pyelogram 4. Left ureteral stent exchange  Surgeon: Hollice Espy, MD  Anesthesia: General  Complications: None  Intraoperative findings: Innumerable small spherical stones filling left mid and distal ureter area and these were all amenable to basket extraction. Small lower pole stone debris also lasered and removed via basket extraction. Stent replaced.   EBL: Minimal  Specimens: Stones previously sent for analysis, deferred today   Drains: 6 x 24 French double-J ureteral stent on left   Indication: Courtney Murphy is a 61 y.o. 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 She developed left Steinstrasse as a result of innumerable tiny spherical stones which coursed on the length of the ureter at the time of PCNL.  After reviewing the management options for treatment, she elected to proceed with the above surgical procedure(s). We have discussed the potential benefits and risks of the procedure, side effects of the proposed treatment, the likelihood of the patient achieving the goals of the procedure, and any potential problems that might occur during the procedure or recuperation. Informed consent has been obtained.  Description of procedure:  The patient was taken to the operating room and general anesthesia was induced.  The patient was placed in the dorsal lithotomy position, prepped and draped in the usual sterile fashion, and preoperative antibiotics were administered. A preoperative time-out was performed.   Attention was turned to left ureteral orifice from which a ureteral stent was seen emanating. The distal coil of the stent was grasped and brought to level of the urethral  meatus. The stent was attempted to be cannulated, however, due to encrustation of the distal coil was unable to be cannulated. The stent was pulled out partially and the distal aspect was a be tailored at which time it was ultimately able to accommodate the wire. The wire was advanced all the way up to level of the kidney. This was left in place and the stent was removed. This was left in place as a safety wire which was snapped in place. This point time, 4.5 semirigid ureteroscope was advanced to the distal ureter where innumerable tiny spherical stones were encountered. This created a Steinstrasse within the entire length of the mid and distal left ureter. There quite small in size with only a few larger fragments. All of these fragments were amenable to basket extraction. This was achieved using 1.9 to plus nitinol basket as well as careful methodical deployment of a [employer]. Once the entire length of the ureter was cleared, the scope was able to be advanced up to the proximal ureter were no obvious residual stone fragment were identified. The Super Stiff wire was then placed under direct visualization through the ureteroscope leaving this in place as a working wire. The scope was removed. Then advanced an 8 French dual-lumen flexible ureteroscope over the Super Stiff wire up to level of the renal pelvis. This is carefully inspected. There is a few small stones in the lower pole at which time a 273  laser fiber was then used using very settings including 0.2 J and 40 Hz followed by 1.2 J and 15 Hz to fragment these larger stones into very tiny pieces. The Super Stiff wire was then replaced. A 12/14 [place]  access sheath was then advanced over the working wire to the level of the proximal ureter. The inner cannula was removed and the scope was reintroduced back into the collecting system. The majority of lower pole stones were then extracted using 1.9 [place] to plus spinal basket. A few very very  tiny pieces were unable to be basketed due to their very small size. Finally, a retrograde pyelogram was performed at the level of the renal pelvises scope. This created a roadmap of the kidney. There is no extravasation or was filling defects appreciated. Formal pyeloscopy was performed to ensure that each never calyx had been directly visualized and was free of any additional stone from was assisting satisfactory, the scope was then backed down the length of the ureter removing the access sheath along the way. There were no ureteral injuries appreciated and no residual stone fragments. A 6 x 24 French double-J ureteral stent was then advanced over the wire up to level of the renal pelvis. The wire was partially withdrawn until full coil was noted within the renal pelvis. The wire was then fully withdrawn and full coil was noted within the bladder. The bladder was then drained. The patient was then cleaned and dried, repositioned the supine position, reversed from anesthesia, taken the PACU in stable condition.  Plan: Patient will return next week for cystoscopy, stent removal.  Hollice Espy, M.D.

## 2017-09-08 NOTE — Interval H&P Note (Signed)
History and Physical Interval Note:  09/08/2017 10:47 AM  Courtney Murphy  has presented today for surgery, with the diagnosis of Left nephrolithiasis  The various methods of treatment have been discussed with the patient and family. After consideration of risks, benefits and other options for treatment, the patient has consented to  Procedure(s): CYSTOSCOPY/URETEROSCOPY/HOLMIUM LASER/STENT EXCHANGE (Left) as a surgical intervention .  The patient's history has been reviewed, patient examined, no change in status, stable for surgery.  I have reviewed the patient's chart and labs.  Questions were answered to the patient's satisfaction.    RRR CTAB  Hollice Espy

## 2017-09-08 NOTE — Transfer of Care (Signed)
Immediate Anesthesia Transfer of Care Note  Patient: Courtney Murphy  Procedure(s) Performed: Procedure(s): CYSTOSCOPY/URETEROSCOPY/HOLMIUM LASER/STENT EXCHANGE (Left)  Patient Location: PACU  Anesthesia Type:General  Level of Consciousness: sedated  Airway & Oxygen Therapy: Patient Spontanous Breathing and Patient connected to face mask oxygen  Post-op Assessment: Report given to RN and Post -op Vital signs reviewed and stable  Post vital signs: Reviewed and stable  Last Vitals:  Vitals:   09/08/17 1008 09/08/17 1314  BP: (!) 150/78 131/70  Pulse: 67 85  Resp: 18 18  Temp: 36.6 C 36.8 C  SpO2: 195% 093%    Complications: No apparent anesthesia complications

## 2017-09-08 NOTE — Discharge Instructions (Signed)
AMBULATORY SURGERY  DISCHARGE INSTRUCTIONS   1) The drugs that you were given will stay in your system until tomorrow so for the next 24 hours you should not:  A) Drive an automobile B) Make any legal decisions C) Drink any alcoholic beverage   2) You may resume regular meals tomorrow.  Today it is better to start with liquids and gradually work up to solid foods.  You may eat anything you prefer, but it is better to start with liquids, then soup and crackers, and gradually work up to solid foods.   3) Please notify your doctor immediately if you have any unusual bleeding, trouble breathing, redness and pain at the surgery site, drainage, fever, or pain not relieved by medication.    4) Additional Instructions:        Please contact your physician with any problems or Same Day Surgery at [phone], Monday through Friday 6 am to 4 pm, or [place] [facility] number at [phone].You have a ureteral stent in place.  This is a tube that extends from your kidney to your bladder.  This may cause urinary bleeding, burning with urination, and urinary frequency.  Please call our office or present to the ED if you develop fevers >101 or pain which is not able to be controlled with oral pain medications.  You may be given either Flomax and/ or ditropan to help with bladder spasms and stent pain in addition to pain medications.    [facility] [address], [address] , [place] [address] [phone]  

## 2017-09-08 NOTE — H&P (View-Only) (Signed)
 09/01/2017 2:55 PM   Courtney Murphy 08/25/1956 [id number]  Referring provider: Krasovich, Janelle, MD No address on file  Chief Complaint  Patient presents with  . Routine Post Op    HPI: 61-year-old female with a 3 x 1.1 cm left renal stone) was left PCNL on 08/18/2017. Her surgery was uncomplicated and she was discharged home on postop day 1. She was seen in the emergency room the following day for stent pain but had no significant issues since then. She does have continued episodes of gross hematuria with ambulating and frequency but this is also improving. No fevers or chills. Her incision is now well-healed.  Intraoperatively, she was noted to have multiple tiny spherical stones which tracks like gravel along the length of the ureter. An antegrade ureteral stent was placed.  Follow-up CT scan today shows residual stone primarily at the level of the iliacs and distal to this alongside the stone. She's not had significant interval passage of the small fragments. She also has a few small nonobstructing calculi in the left lower pole.   PMH: Past Medical History:  Diagnosis Date  . Acid reflux 10/11/2015  . Anemia   . Depression   . Diabetes mellitus without complication (HCC)   . Fatty liver   . Gout   . History of kidney stones   . Hypertension   . Hypothyroidism   . Kidney stone   . Knee pain    Left  . Pulmonary emboli (HCC)   . Restless leg syndrome   . Shoulder pain     Surgical History: Past Surgical History:  Procedure Laterality Date  . ABDOMINAL HYSTERECTOMY    . BACK SURGERY    . BREAST REDUCTION SURGERY    . CATARACT EXTRACTION W/PHACO Left 07/28/2017   Procedure: CATARACT EXTRACTION PHACO AND INTRAOCULAR LENS PLACEMENT (IOC) diabetic Left;  Surgeon: Brasington, Chadwick, MD;  Location: [facility];  Service: Ophthalmology;  Laterality: Left;  Diabetic  . CESAREAN SECTION  1990  . CHOLECYSTECTOMY    . FOOT SURGERY    . GASTRIC BYPASS    .  IR NEPHROSTOMY PLACEMENT LEFT  08/17/2017  . KNEE ARTHROSCOPY Left   . KNEE ARTHROSCOPY Left 07/19/2015   Procedure: ARTHROSCOPY KNEE;  Surgeon: Harold Kernodle, MD;  Location: [facility];  Service: Orthopedics;  Laterality: Left;  . NEPHROLITHOTOMY Left 08/17/2017   Procedure: NEPHROLITHOTOMY PERCUTANEOUS WITH HOLMIUM LASER;  Surgeon: Crystalina Stodghill, MD;  Location: [facility];  Service: Urology;  Laterality: Left;  . THUMB ARTHROSCOPY    . TONSILLECTOMY      Home Medications:  Allergies as of 09/01/2017      Reactions   Sulfasalazine Hives   Nsaids    Avoids because of gastric bypass   Sulfa Antibiotics Hives   Penicillins Hives, Rash   Has patient had a PCN reaction causing immediate rash, facial/tongue/throat swelling, SOB or lightheadedness with hypotension: No Has patient had a PCN reaction causing severe rash involving mucus membranes or skin necrosis: No Has patient had a PCN reaction that required hospitalization: No Has patient had a PCN reaction occurring within the last 10 years: No If all of the above answers are "NO", then may proceed with Cephalosporin use.      Medication List       Accurate as of 09/01/17  2:55 PM. Always use your most recent med list.          aspirin 81 MG tablet Take 81 mg by mouth daily.     atomoxetine 40 MG capsule Commonly known as:  STRATTERA Take 1 capsule (40 mg total) by mouth 2 (two) times daily with a meal.   atorvastatin 20 MG tablet Commonly known as:  LIPITOR Take 20 mg by mouth daily.   B-12 1000 MCG/ML Kit Inject 1 Dose as directed every 14 (fourteen) days.   Biotin 5000 MCG Caps Take 5,000 mcg by mouth daily.   CALCIUM-MAGNESIUM-ZINC PO Take 3 tablets by mouth at bedtime.   docusate sodium 100 MG capsule Commonly known as:  COLACE Take 1 capsule (100 mg total) by mouth 2 (two) times daily.   escitalopram 20 MG tablet Commonly known as:  LEXAPRO Take 40 mg by mouth daily.   ferrous sulfate 325 (65 FE) MG  tablet Take 325 mg by mouth at bedtime.   gabapentin 300 MG capsule Commonly known as:  NEURONTIN Take 300 mg by mouth at bedtime as needed (nerve pain).   levothyroxine 100 MCG tablet Commonly known as:  SYNTHROID, LEVOTHROID Take 100 mcg by mouth daily before breakfast.   lisinopril 20 MG tablet Commonly known as:  PRINIVIL,ZESTRIL Take 20 mg by mouth daily as needed (high blood pressure).   naproxen sodium 220 MG tablet Commonly known as:  ANAPROX Take 440 mg by mouth as needed (pain).   OMEGA 3 PO Take 3 tablets by mouth every evening.   oxybutynin 5 MG tablet Commonly known as:  DITROPAN Take 1 tablet (5 mg total) by mouth every 8 (eight) hours as needed for bladder spasms.   oxyCODONE-acetaminophen 5-325 MG tablet Commonly known as:  PERCOCET Take 1-2 tablets by mouth every 4 (four) hours as needed for moderate pain or severe pain.   pramipexole 0.5 MG tablet Commonly known as:  MIRAPEX Take 0.5 mg by mouth at bedtime. May take an additional 0.5 mg for restless legs   tamsulosin 0.4 MG Caps capsule Commonly known as:  FLOMAX Take 1 capsule (0.4 mg total) by mouth daily.   TRULICITY 0.75 MG/0.5ML Sopn Generic drug:  Dulaglutide Inject 0.75 mg into the skin every Thursday.   Turmeric Curcumin 500 MG Caps Take 1,000 mg by mouth at bedtime.   VITAMIN B COMPLEX PO Take 1 tablet by mouth daily.       Allergies:  Allergies  Allergen Reactions  . Sulfasalazine Hives  . 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 Sulfa Antibiotics Hives  . Penicillins Hives and Rash    Has patient had a PCN reaction causing immediate rash, facial/tongue/throat swelling, SOB or lightheadedness with hypotension: No Has patient had a PCN reaction causing severe rash involving mucus membranes or skin necrosis: No Has patient had a PCN reaction that required hospitalization: No Has patient had a PCN reaction occurring within the last 10 years: No If all of the above answers  are "NO", then may proceed with Cephalosporin use.     Family History: Family History  Problem Relation Age of Onset  . Cancer Mother        breast  . Heart disease Father   . Diabetes Father   . Hypertension Father   . Cancer Maternal Grandmother   . Cancer Maternal Grandfather   . Heart disease Paternal Grandmother   . Heart disease Paternal Grandfather   . Cancer Maternal Aunt   . Cancer Maternal Uncle   . Heart disease Paternal Aunt   . Heart disease Paternal Uncle   . Kidney cancer Neg Hx   . Bladder Cancer Neg   Hx     Social History:  reports that she quit smoking about 2 years ago. Her smoking use included Cigarettes. She smoked 0.50 packs per day. She has never used smokeless tobacco. She reports that she does not drink alcohol or use drugs.  ROS: UROLOGY Frequent Urination?: No Hard to postpone urination?: No Burning/pain with urination?: No Get up at night to urinate?: Yes Leakage of urine?: No Urine stream starts and stops?: No Trouble starting stream?: No Do you have to strain to urinate?: No Blood in urine?: Yes Urinary tract infection?: No Sexually transmitted disease?: No Injury to kidneys or bladder?: No Painful intercourse?: No Weak stream?: No Currently pregnant?: No Vaginal bleeding?: No Last menstrual period?: n  Gastrointestinal Nausea?: No Vomiting?: No Indigestion/heartburn?: No Diarrhea?: No Constipation?: No  Constitutional Fever: No Night sweats?: No Weight loss?: No Fatigue?: No  Skin Skin rash/lesions?: No Itching?: No  Eyes Blurred vision?: No Double vision?: No  Ears/Nose/Throat Sore throat?: No Sinus problems?: No  Hematologic/Lymphatic Swollen glands?: No Easy bruising?: No  Cardiovascular Leg swelling?: No Chest pain?: No  Respiratory Cough?: No Shortness of breath?: No  Endocrine Excessive thirst?: No  Musculoskeletal Back pain?: Yes Joint pain?: No  Neurological Headaches?: No Dizziness?:  No  Psychologic Depression?: No Anxiety?: No  Physical Exam: BP 103/70   Pulse 99   Ht 5' 4" (1.626 m)   Wt 183 lb (83 kg)   BMI 31.41 kg/m   Constitutional:  Alert and oriented, No acute distress. HEENT: [place] AT, moist mucus membranes.  Trachea midline, no masses. Cardiovascular: No clubbing, cyanosis, or edema. Respiratory: Normal respiratory effort, no increased work of breathing. GI: Abdomen is soft, nontender, nondistended, no abdominal masses GU: No CVA tenderness. Left flank incision well-healed. Skin: No rashes, bruises or suspicious lesions. Neurologic: Grossly intact, no focal deficits, moving all 4 extremities. Psychiatric: Normal mood and affect.  Laboratory Data: Lab Results  Component Value Date   WBC 10.8 08/19/2017   HGB 11.2 (L) 08/19/2017   HCT 32.4 (L) 08/19/2017   MCV 90.1 08/19/2017   PLT 232 08/19/2017    Lab Results  Component Value Date   CREATININE 0.82 08/19/2017    Urinalysis Lab Results  Component Value Date   SPECGRAV >1.030 (H) 08/26/2017   PHUR 6.0 08/26/2017   COLORU Yellow 08/26/2017   APPEARANCEUR Cloudy (A) 08/26/2017   LEUKOCYTESUR 1+ (A) 08/26/2017   PROTEINUR 3+ (A) 08/26/2017   GLUCOSEU Negative 08/26/2017   KETONESU Trace (A) 08/26/2017   RBCU 3+ (A) 08/26/2017   BILIRUBINUR Negative 08/26/2017   UUROB 0.2 08/26/2017   NITRITE Negative 08/26/2017    Lab Results  Component Value Date   LABMICR See below: 08/26/2017   WBCUA 11-30 (A) 08/26/2017   RBCUA >30 (H) 08/26/2017   LABEPIT 0-10 08/26/2017   BACTERIA Many (A) 08/26/2017    Pertinent Imaging:  Results for orders placed during the hospital encounter of 08/31/17  CT RENAL STONE STUDY   Narrative CLINICAL DATA:  Left-sided nephrolithiasis.  EXAM: CT ABDOMEN AND PELVIS WITHOUT CONTRAST  TECHNIQUE: Multidetector CT imaging of the abdomen and pelvis was performed following the standard protocol without IV contrast.  COMPARISON:   08/19/2017  FINDINGS: Lower chest: No acute abnormality.  Hepatobiliary: No focal liver abnormality is seen. Status post cholecystectomy. No biliary dilatation.  Pancreas: Unremarkable. No pancreatic ductal dilatation or surrounding inflammatory changes.  Spleen: Normal in size without focal abnormality.  Adrenals/Urinary Tract: The adrenal glands are normal. The right kidney is   unremarkable. No mass or hydronephrosis. There is left-sided pelvocaliectasis which appears similar to previous exam. A double-J nephroureteral stent is identified calcifications within the left mid ureter are identified, image 58 of series 2 through image 67 of series 2. No stones identified within the urinary bladder.  Stomach/Bowel: Postoperative changes from gastric bypass surgery identified. No dilated loops of small bowel identified. The appendix is visualized and appears normal. No pathologic dilatation of the colon.  Vascular/Lymphatic: Normal appearance of the abdominal aorta. No enlarged retroperitoneal or mesenteric adenopathy. No enlarged pelvic or inguinal lymph nodes.  Reproductive: Status post hysterectomy. No adnexal masses.  Other: No abdominal wall hernia or abnormality. No abdominopelvic ascites.  Musculoskeletal: There is degenerative disc disease identified within the lumbar spine. No aggressive lytic or sclerotic bone lesions.  IMPRESSION: 1. Stable position of left-sided double-J nephroureteral stent. 2. Again noted are multiple stones within the left mid ureter. Persistent mild left-sided hydronephrosis.   Electronically Signed   By: Taylor  Stroud M.D.   On: 09/01/2017 08:58    CT scan personally reviewed today.  Assessment & Plan:    1. Left nephrolithiasis S/p left PCNL with small amount of left lower pole residual stone burden as well as surgery along the length of the ureter beside the stent. Given its been several weeks since surgery without significant  passage of the stone debris, I recommended returning to the operating room for left ureteroscopy, laser lithotripsy to clear residual stone burden.  Risks and benefits of ureteroscopy were reviewed including but not limited to infection, bleeding, pain, ureteral injury which could require open surgery versus prolonged indwelling if ureteralperforation occurs, persistent stone disease, requirement for staged procedure, possible stent, and global anesthesia risks. Patient expressed understanding and desires to proceed with ureteroscopy.  Preop UA/UX  - Urinalysis, Complete - CULTURE, URINE COMPREHENSIVE  Schedule left URS, LL, stent exchange next week  Denitra Donaghey, MD  [facility] [address], [address] [place], [place] [address] [phone]  

## 2017-09-08 NOTE — Anesthesia Post-op Follow-up Note (Signed)
Anesthesia QCDR form completed.        

## 2017-09-09 ENCOUNTER — Other Ambulatory Visit: Payer: Self-pay | Admitting: Unknown Physician Specialty

## 2017-09-14 ENCOUNTER — Ambulatory Visit: Payer: BC Managed Care – PPO | Admitting: Urology

## 2017-09-15 ENCOUNTER — Ambulatory Visit (INDEPENDENT_AMBULATORY_CARE_PROVIDER_SITE_OTHER): Payer: BC Managed Care – PPO | Admitting: Urology

## 2017-09-15 VITALS — BP 123/83 | HR 88 | Ht 64.0 in | Wt 181.0 lb

## 2017-09-15 DIAGNOSIS — N200 Calculus of kidney: Secondary | ICD-10-CM

## 2017-09-15 LAB — URINALYSIS, COMPLETE
BILIRUBIN UA: NEGATIVE
Glucose, UA: NEGATIVE
Nitrite, UA: NEGATIVE
PH UA: 5.5 (ref 5.0–7.5)
Specific Gravity, UA: 1.02 (ref 1.005–1.030)
UUROB: 1 mg/dL (ref 0.2–1.0)

## 2017-09-15 LAB — MICROSCOPIC EXAMINATION: RBC, UA: >30 /hpf — ABNORMAL HIGH (ref 0–?)

## 2017-09-15 MED ORDER — LIDOCAINE HCL 2 % EX GEL
1.0000 "application " | Freq: Once | CUTANEOUS | Status: AC
  Administered 2017-09-15: 1 via URETHRAL

## 2017-09-15 MED ORDER — CIPROFLOXACIN HCL 500 MG PO TABS
500.0000 mg | ORAL_TABLET | Freq: Once | ORAL | Status: AC
  Administered 2017-09-15: 500 mg via ORAL

## 2017-09-15 NOTE — Progress Notes (Signed)
   09/15/17  CC:  Chief Complaint  Patient presents with  . Cysto Stent Removal    HPI:  The patient is a 61 year old female with recent history of a left PCNL with subsequent left ureteroscopy presents today for left ureteral stent removal.  There were no vitals taken for this visit. NED. A&Ox3.   No respiratory distress   Abd soft, NT, ND Normal external genitalia with patent urethral meatus  Cystoscopy Procedure Note  Patient identification was confirmed, informed consent was obtained, and patient was prepped using Betadine solution.  Lidocaine jelly was administered per urethral meatus.    Preoperative abx where received prior to procedure.    Procedure: - Flexible cystoscope introduced, without any difficulty.   - Thorough search of the bladder revealed:   Left ureteral stent grasped with flexible graspers and removed per urethral meatus intact.  Post-Procedure: - Patient tolerated the procedure well  Assessment/ Plan:  1. Left nephrolithiasis The patient is undergone a successful stent removal. We'll plan to have her follow-up in one month with a renal ultrasound prior to rule out iatrogenic hydronephrosis. We will go over stone analysis at that time.  Nickie Retort, MD

## 2017-09-21 ENCOUNTER — Ambulatory Visit
Admission: RE | Admit: 2017-09-21 | Discharge: 2017-09-21 | Disposition: A | Discharge status: Home / Self Care | Payer: BC Managed Care – PPO | Source: Ambulatory Visit | Attending: Urology | Admitting: Urology

## 2017-09-21 DIAGNOSIS — N200 Calculus of kidney: Secondary | ICD-10-CM | POA: Diagnosis not present

## 2017-10-04 ENCOUNTER — Other Ambulatory Visit: Payer: Self-pay

## 2017-10-04 ENCOUNTER — Emergency Department
Admission: EM | Admit: 2017-10-04 | Discharge: 2017-10-04 | Disposition: A | Discharge status: Home / Self Care | Payer: BC Managed Care – PPO | Attending: Emergency Medicine | Admitting: Emergency Medicine

## 2017-10-04 DIAGNOSIS — F909 Attention-deficit hyperactivity disorder, unspecified type: Secondary | ICD-10-CM | POA: Diagnosis not present

## 2017-10-04 DIAGNOSIS — I951 Orthostatic hypotension: Secondary | ICD-10-CM | POA: Insufficient documentation

## 2017-10-04 DIAGNOSIS — Z87891 Personal history of nicotine dependence: Secondary | ICD-10-CM | POA: Insufficient documentation

## 2017-10-04 DIAGNOSIS — N3001 Acute cystitis with hematuria: Secondary | ICD-10-CM | POA: Insufficient documentation

## 2017-10-04 DIAGNOSIS — I10 Essential (primary) hypertension: Secondary | ICD-10-CM | POA: Insufficient documentation

## 2017-10-04 DIAGNOSIS — E039 Hypothyroidism, unspecified: Secondary | ICD-10-CM | POA: Diagnosis not present

## 2017-10-04 DIAGNOSIS — Z86718 Personal history of other venous thrombosis and embolism: Secondary | ICD-10-CM | POA: Diagnosis not present

## 2017-10-04 DIAGNOSIS — E119 Type 2 diabetes mellitus without complications: Secondary | ICD-10-CM | POA: Insufficient documentation

## 2017-10-04 DIAGNOSIS — R42 Dizziness and giddiness: Secondary | ICD-10-CM | POA: Diagnosis present

## 2017-10-04 LAB — BASIC METABOLIC PANEL
ANION GAP: 12 (ref 5–15)
BUN: 17 mg/dL (ref 6–20)
CALCIUM: 9.5 mg/dL (ref 8.9–10.3)
CHLORIDE: 100 mmol/L — AB (ref 101–111)
CO2: 25 mmol/L (ref 22–32)
Creatinine, Ser: 0.85 mg/dL (ref 0.44–1.00)
GFR calc Af Amer: >60 mL/min (ref >60)
GFR calc non Af Amer: >60 mL/min (ref >60)
GLUCOSE: 142 mg/dL — AB (ref 65–99)
Potassium: 4.1 mmol/L (ref 3.5–5.1)
Sodium: 137 mmol/L (ref 135–145)

## 2017-10-04 LAB — URINALYSIS, COMPLETE (UACMP) WITH MICROSCOPIC
Bilirubin Urine: NEGATIVE
GLUCOSE, UA: NEGATIVE mg/dL
KETONES UR: NEGATIVE mg/dL
Leukocytes, UA: NEGATIVE
Nitrite: NEGATIVE
PROTEIN: NEGATIVE mg/dL
Specific Gravity, Urine: 1.024 (ref 1.005–1.030)
pH: 5 (ref 5.0–8.0)

## 2017-10-04 LAB — CBC
HCT: 40.6 % (ref 35.0–47.0)
HEMOGLOBIN: 14 g/dL (ref 12.0–16.0)
MCH: 31.6 pg (ref 26.0–34.0)
MCHC: 34.5 g/dL (ref 32.0–36.0)
MCV: 91.6 fL (ref 80.0–100.0)
Platelets: 292 10*3/uL (ref 150–440)
RBC: 4.43 MIL/uL (ref 3.80–5.20)
RDW: 13.7 % (ref 11.5–14.5)
WBC: 14 10*3/uL — ABNORMAL HIGH (ref 3.6–11.0)

## 2017-10-04 LAB — TROPONIN I

## 2017-10-04 MED ORDER — SODIUM CHLORIDE 0.9 % IV SOLN
Freq: Once | INTRAVENOUS | Status: AC
  Administered 2017-10-04: 14:00:00 via INTRAVENOUS

## 2017-10-04 MED ORDER — CIPROFLOXACIN HCL 500 MG PO TABS: 500.0000 mg | ORAL_TABLET | Freq: Two times a day (BID) | ORAL | 0 refills | Status: DC

## 2017-10-04 MED ORDER — CIPROFLOXACIN IN D5W 400 MG/200ML IV SOLN
400.0000 mg | Freq: Once | INTRAVENOUS | Status: AC
  Administered 2017-10-04: 400 mg via INTRAVENOUS
  Filled 2017-10-04: qty 200

## 2017-10-04 NOTE — ED Notes (Signed)
Pt alert and oriented X4, active, cooperative, pt in NAD. RR even and unlabored, color WNL.  Pt informed to return if any life threatening symptoms occur.  Discharge and followup instructions reviewed.  

## 2017-10-04 NOTE — ED Provider Notes (Signed)
[facility] Emergency Department Provider Note       Time seen: ----------------------------------------- 1:49 PM on 10/04/2017 -----------------------------------------    I have reviewed the triage vital signs and the nursing notes.  HISTORY   Chief Complaint Dizziness    HPI Courtney Murphy is a 61 y.o. female with a history of diabetes who presents to the ED for dizziness for the past several days that is worse when she stands up.  Dizziness has been associated with some diaphoresis.  Patient states symptoms are definitely worse when she goes from sitting to standing or lying to standing.  She feels off balance for a minute and then will resolve.  She denies any fevers, chills or other complaints.  Patient reports she had surgery for a staghorn calculus recently.  Past Medical History:  Diagnosis Date  . Acid reflux 10/11/2015  . Anemia   . Arthritis   . Depression   . Diabetes mellitus without complication ([place])   . Fatty liver   . Gout   . History of kidney stones   . Hypertension   . Hypothyroidism   . Kidney stone   . Knee pain    Left  . Pulmonary emboli ([place])   . Restless leg syndrome   . Shoulder pain     Patient Active Problem List   Diagnosis Date Noted  . Kidney stone 08/17/2017  . Type 2 diabetes mellitus without complication, without long-term current use of insulin ([place]) 04/22/2017  . Chest pain 09/30/2016  . Acid reflux 10/11/2015  . Adult hypothyroidism 10/11/2015  . ADHD (attention deficit hyperactivity disorder) 10/11/2015  . Bariatric surgery status 10/22/2014  . HLD (hyperlipidemia) 08/18/2013    Past Surgical History:  Procedure Laterality Date  . ABDOMINAL HYSTERECTOMY    . BACK SURGERY    . BREAST REDUCTION SURGERY    . CESAREAN SECTION  1990  . CHOLECYSTECTOMY    . FOOT SURGERY    . GASTRIC BYPASS    . IR NEPHROSTOMY PLACEMENT LEFT  08/17/2017  . KNEE ARTHROSCOPY Left   . THUMB ARTHROSCOPY    .  TONSILLECTOMY      Allergies Sulfasalazine; Nsaids; Sulfa antibiotics; and Penicillins  Social History Social History   Tobacco Use  . Smoking status: Former Smoker    Packs/day: 0.50    Types: Cigarettes    Last attempt to quit: 04/14/2015    Years since quitting: 2.4  . Smokeless tobacco: Never Used  Substance Use Topics  . Alcohol use: No  . Drug use: No    Review of Systems Constitutional: Negative for fever. Eyes: Negative for vision changes ENT:  Negative for congestion, sore throat Cardiovascular: Negative for chest pain. Respiratory: Negative for shortness of breath. Gastrointestinal: Negative for abdominal pain, vomiting and diarrhea. Genitourinary: Negative for dysuria. Musculoskeletal: Negative for back pain. Skin: Negative for rash. Neurological: Negative for headaches, positive for weakness  All systems negative/normal/unremarkable except as stated in the HPI  ____________________________________________   PHYSICAL EXAM:  VITAL SIGNS: ED Triage Vitals  Enc Vitals Group     BP 10/04/17 1140 118/78     Pulse Rate 10/04/17 1140 (!) 103     Resp --      Temp 10/04/17 1140 98 F (36.7 C)     Temp Source 10/04/17 1140 Oral     SpO2 10/04/17 1140 98 %     Weight 10/04/17 1141 180 lb (81.6 kg)     Height 10/04/17 1141 5\' 4"  (1.626  m)     Head Circumference --      Peak Flow --      Pain Score --      Pain Loc --      Pain Edu? --      Excl. in [place]? --    Constitutional: Alert and oriented. Well appearing and in no distress. Eyes: Conjunctivae are normal. Normal extraocular movements. ENT   Head: Normocephalic and atraumatic.   Nose: No congestion/rhinnorhea.   Mouth/Throat: Mucous membranes are moist.   Neck: No stridor. Cardiovascular: Normal rate, regular rhythm. No murmurs, rubs, or gallops. Respiratory: Normal respiratory effort without tachypnea nor retractions. Breath sounds are clear and equal bilaterally. No  wheezes/rales/rhonchi. Gastrointestinal: Soft and nontender. Normal bowel sounds Musculoskeletal: Nontender with normal range of motion in extremities. No lower extremity tenderness nor edema. Neurologic:  Normal speech and language. No gross focal neurologic deficits are appreciated.  Skin:  Skin is warm, dry and intact. No rash noted. Psychiatric: Mood and affect are normal. Speech and behavior are normal.  ____________________________________________  EKG: Interpreted by me.  Sinus rhythm rate of 97 bpm, normal PR interval, normal QRS, normal QT  ____________________________________________  ED COURSE:  Pertinent labs & imaging results that were available during my care of the patient were reviewed by me and considered in my medical decision making (see chart for details). Patient presents for dizziness, we will assess with labs and imaging as indicated.  We will also check orthostatics Clinical Course as of Oct 04 1409  Mon Oct 04, 2017  1358 126/96 lying 86/70 Standing  [JW]    Clinical Course User Index [JW] Earleen Newport, MD   Procedures ____________________________________________   LABS (pertinent positives/negatives)  Labs Reviewed  BASIC METABOLIC PANEL - Abnormal; Notable for the following components:      Result Value   Chloride 100 (*)    Glucose, Bld 142 (*)    All other components within normal limits  CBC - Abnormal; Notable for the following components:   WBC 14.0 (*)    All other components within normal limits  URINALYSIS, COMPLETE (UACMP) WITH MICROSCOPIC - Abnormal; Notable for the following components:   Color, Urine YELLOW (*)    APPearance HAZY (*)    Hgb urine dipstick SMALL (*)    Bacteria, UA RARE (*)    Squamous Epithelial / LPF 6-30 (*)    All other components within normal limits  URINE CULTURE  TROPONIN I  CBG MONITORING, ED   ____________________________________________  DIFFERENTIAL DIAGNOSIS   Orthostatic hypotension,  occult infection, electrolyte abnormality, MI, arrhythmia, CVA  FINAL ASSESSMENT AND PLAN  Orthostatic hypotension, UTI   Plan: Patient had presented for dizziness. Patient's labs did reveal a elevated white blood cell count and possible UTI.  She was given IV fluid bolus as well as IV antibiotics and will be discharged home with antibiotics and close outpatient follow-up with urology or her primary care doctor.   Earleen Newport, MD   Note: This note was generated in part or whole with voice recognition software. Voice recognition is usually quite accurate but there are transcription errors that can and very often do occur. I apologize for any typographical errors that were not detected and corrected.     Earleen Newport, MD 10/04/17 [phone]

## 2017-10-04 NOTE — ED Triage Notes (Signed)
Pt reports that for past few days she has been feeling dizzy upon standing. Dizzy spells associated with diaphoresis. Pt alert and oriented X4, active, cooperative, pt in NAD. RR even and unlabored, color WNL.

## 2017-10-06 LAB — URINE CULTURE
CULTURE: NO GROWTH
SPECIAL REQUESTS: NORMAL

## 2017-10-12 ENCOUNTER — Encounter: Payer: Self-pay | Admitting: Urology

## 2017-10-12 ENCOUNTER — Ambulatory Visit: Payer: BC Managed Care – PPO | Admitting: Urology

## 2017-10-12 VITALS — BP 101/65 | HR 88 | Ht 64.0 in | Wt 180.0 lb

## 2017-10-12 DIAGNOSIS — N200 Calculus of kidney: Secondary | ICD-10-CM

## 2017-10-12 NOTE — Progress Notes (Signed)
10/12/2017 5:08 PM   Courtney Murphy Sep 16, 1956 [id number]  Referring provider: Audelia Acton, MD No address on file  No chief complaint on file.   HPI: 61 year old female with history of 3 x 1.1 cm left renal stone s/p left PCNL on 08/18/2017 followed by staged left ureteroscopy on 09/08/2017 for treatment of innumerable left ureteral stone fragments which were cleared.    Postoperatively, she is done quite well.  She was seen in the emergency room on 10/04/2017 for orthostatic hypotension felt to possibly be related to her blood pressure medication.  This has since improved.  She was given antibiotics for presumed urinary tract infection but urine culture was negative.  She never took the antibiotics.  She denies any urinary symptoms today.  No flank pain.  Stone analysis consistent with 70% calcium oxalate monohydrate, 30% calcium phosphate stone.  No stone episodes prior to this.    She does have a history of gastric bypass.  She does report that she drinks very little fluid throughout the day and minimal water.  She does not add salt to her food.  Follow-up renal ultrasound on 09/21/2017 shows an unremarkable left kidney without hydronephrosis.  There is a 5 mm possible calcification in the lower pole.   PMH: Past Medical History:  Diagnosis Date  . Acid reflux 10/11/2015  . Anemia   . Arthritis   . Depression   . Diabetes mellitus without complication ([place])   . Fatty liver   . Gout   . History of kidney stones   . Hypertension   . Hypothyroidism   . Kidney stone   . Knee pain    Left  . Pulmonary emboli ([place])   . Restless leg syndrome   . Shoulder pain     Surgical History: Past Surgical History:  Procedure Laterality Date  . ABDOMINAL HYSTERECTOMY    . BACK SURGERY    . BREAST REDUCTION SURGERY    . CESAREAN SECTION  1990  . CHOLECYSTECTOMY    . FOOT SURGERY    . GASTRIC BYPASS    . IR NEPHROSTOMY PLACEMENT LEFT  08/17/2017  . KNEE ARTHROSCOPY  Left   . THUMB ARTHROSCOPY    . TONSILLECTOMY      Home Medications:  Allergies as of 10/12/2017      Reactions   Sulfasalazine Hives   Nsaids    Avoids because of gastric bypass   Sulfa Antibiotics Hives   Penicillins Hives, Rash   Has patient had a PCN reaction causing immediate rash, facial/tongue/throat swelling, SOB or lightheadedness with hypotension: No Has patient had a PCN reaction causing severe rash involving mucus membranes or skin necrosis: No Has patient had a PCN reaction that required hospitalization: No Has patient had a PCN reaction occurring within the last 10 years: No If all of the above answers are "NO", then may proceed with Cephalosporin use.      Medication List        Accurate as of 10/12/17  5:08 PM. Always use your most recent med list.          atomoxetine 40 MG capsule Commonly known as:  STRATTERA Take 1 capsule (40 mg total) by mouth 2 (two) times daily with a meal.   atorvastatin 20 MG tablet Commonly known as:  LIPITOR Take 20 mg by mouth daily at 6 PM.   B-12 1000 MCG/ML Kit Inject 1 Dose as directed every 14 (fourteen) days.   Biotin 5000 MCG Caps Take 5,000  mcg by mouth daily.   CALCIUM-MAGNESIUM-ZINC PO Take 3 tablets by mouth at bedtime.   escitalopram 20 MG tablet Commonly known as:  LEXAPRO Take 40 mg by mouth daily.   gabapentin 300 MG capsule Commonly known as:  NEURONTIN Take 300 mg by mouth at bedtime as needed (nerve pain).   levothyroxine 100 MCG tablet Commonly known as:  SYNTHROID, LEVOTHROID Take 100 mcg by mouth daily before breakfast.   pramipexole 0.5 MG tablet Commonly known as:  MIRAPEX Take 0.5 mg by mouth at bedtime. May take an additional 0.5 mg for restless legs   TRULICITY 6.38 GY/6.5LD Sopn Generic drug:  Dulaglutide Inject 0.75 mg into the skin every Thursday.   VITAMIN B COMPLEX PO Take 1 tablet by mouth daily.       Allergies:  Allergies  Allergen Reactions  . Sulfasalazine Hives    . 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 Sulfa Antibiotics Hives  . Penicillins Hives and Rash    Has patient had a PCN reaction causing immediate rash, facial/tongue/throat swelling, SOB or lightheadedness with hypotension: No Has patient had a PCN reaction causing severe rash involving mucus membranes or skin necrosis: No Has patient had a PCN reaction that required hospitalization: No Has patient had a PCN reaction occurring within the last 10 years: No If all of the above answers are "NO", then may proceed with Cephalosporin use.     Family History: Family History  Problem Relation Age of Onset  . Cancer Mother        breast  . Heart disease Father   . Diabetes Father   . Hypertension Father   . Cancer Maternal Grandmother   . Cancer Maternal Grandfather   . Heart disease Paternal Grandmother   . Heart disease Paternal Grandfather   . Cancer Maternal Aunt   . Cancer Maternal Uncle   . Heart disease Paternal Aunt   . Heart disease Paternal Uncle   . Kidney cancer Neg Hx   . Bladder Cancer Neg Hx     Social History:  reports that she quit smoking about 2 years ago. Her smoking use included cigarettes. She smoked 0.50 packs per day. she has never used smokeless tobacco. She reports that she does not drink alcohol or use drugs.  ROS: UROLOGY Frequent Urination?: No Hard to postpone urination?: No Burning/pain with urination?: No Get up at night to urinate?: No Leakage of urine?: No Urine stream starts and stops?: No Trouble starting stream?: No Do you have to strain to urinate?: No Blood in urine?: No Urinary tract infection?: No Sexually transmitted disease?: No Injury to kidneys or bladder?: No Painful intercourse?: No Weak stream?: No Currently pregnant?: No Vaginal bleeding?: No Last menstrual period?: n  Gastrointestinal Nausea?: No Vomiting?: No Indigestion/heartburn?: No Diarrhea?: No Constipation?: No  Constitutional Fever: No Night  sweats?: No Weight loss?: No Fatigue?: No  Skin Skin rash/lesions?: No Itching?: No  Eyes Blurred vision?: No Double vision?: No  Ears/Nose/Throat Sore throat?: No Sinus problems?: No  Hematologic/Lymphatic Swollen glands?: No Easy bruising?: No  Cardiovascular Leg swelling?: No Chest pain?: No  Respiratory Cough?: No Shortness of breath?: No  Endocrine Excessive thirst?: No  Musculoskeletal Back pain?: No Joint pain?: No  Neurological Headaches?: No Dizziness?: No  Psychologic Depression?: No Anxiety?: No  Physical Exam: BP 101/65   Pulse 88   Ht '5\' 4"'  (1.626 m)   Wt 180 lb (81.6 kg)   BMI 30.90 kg/m  Constitutional:  Alert and oriented, No acute distress. HEENT: [place] AT, moist mucus membranes.  Trachea midline, no masses. Cardiovascular: No clubbing, cyanosis, or edema. Respiratory: Normal respiratory effort, no increased work of breathing. GI: Abdomen is soft, nontender, nondistended, no abdominal masses GU: No CVA tenderness.  Skin: No rashes, bruises or suspicious lesions. Neurologic: Grossly intact, no focal deficits, moving all 4 extremities. Psychiatric: Normal mood and affect.  Laboratory Data: Lab Results  Component Value Date   WBC 14.0 (H) 10/04/2017   HGB 14.0 10/04/2017   HCT 40.6 10/04/2017   MCV 91.6 10/04/2017   PLT 292 10/04/2017    Lab Results  Component Value Date   CREATININE 0.85 10/04/2017    Urinalysis Lab Results  Component Value Date   SPECGRAV 1.020 09/15/2017   PHUR 5.5 09/15/2017   COLORU Yellow 09/15/2017   APPEARANCEUR HAZY (A) 10/04/2017   LEUKOCYTESUR NEGATIVE 10/04/2017   PROTEINUR NEGATIVE 10/04/2017   GLUCOSEU NEGATIVE 10/04/2017   KETONESU Trace (A) 09/15/2017   RBCU 6-30 10/04/2017   BILIRUBINUR NEGATIVE 10/04/2017   UUROB 1.0 09/15/2017   NITRITE NEGATIVE 10/04/2017    Lab Results  Component Value Date   LABMICR See below: 09/15/2017   WBCUA >30 (H) 09/15/2017   RBCUA >30 (H)  09/15/2017   LABEPIT 0-10 09/15/2017   MUCUS Present (A) 09/15/2017   BACTERIA RARE (A) 10/04/2017    Pertinent Imaging: Results for orders placed during the hospital encounter of 09/21/17  US RENAL   Narrative CLINICAL DATA:  61 year old female with left kidney stone surgery with placement of stent which was removed 09/15/2017. Subsequent encounter.  EXAM: RENAL / URINARY TRACT ULTRASOUND COMPLETE  COMPARISON:  08/31/2017 CT.  FINDINGS: Right Kidney:  Length: 10.5 cm. Echogenicity within normal limits. No mass or hydronephrosis visualized.  Left Kidney:  Length: 10.9 cm. Echogenicity within normal limits. No hydronephrosis visualized. Lower pole 5.7 mm nonobstructing stone.  Bladder:  Appears normal for degree of bladder distention.  IMPRESSION: Left lower pole 5.7 mm nonobstructing stone.  No hydronephrosis.   Electronically Signed   By: Genia Del M.D.   On: 09/21/2017 15:55    Ultrasound personally reviewed today  Assessment & Plan:    1. Left nephrolithiasis Stone analysis reviewed No evidence of iatrogenic hydronephrosis Small calcification on ultrasound, may be stone debris versus small residual stone burden, will continue to follow We discussed general stone prevention techniques including drinking plenty water with goal of producing 2.5 L urine daily, increased citric acid intake, avoidance of high oxalate containing foods, and decreased salt intake.  Information about dietary recommendations given today.  Risk factors for stones primarily include not drinking enough fluids and history of gastric bypass Will defer metabolic workup at this time but consider if she develops new stones or increased size of her residual stone burden - DG Abd 1 View; Future  Return in about 6 months (around 04/11/2018) for KUB.  Hollice Espy, MD  [facility] [address] [place], [place] [address] [phone]

## 2017-10-14 ENCOUNTER — Ambulatory Visit: Payer: BC Managed Care – PPO | Admitting: Podiatry

## 2017-10-14 ENCOUNTER — Encounter: Payer: Self-pay | Admitting: Podiatry

## 2017-10-14 DIAGNOSIS — D3613 Benign neoplasm of peripheral nerves and autonomic nervous system of lower limb, including hip: Secondary | ICD-10-CM

## 2017-10-14 DIAGNOSIS — L600 Ingrowing nail: Secondary | ICD-10-CM | POA: Diagnosis not present

## 2017-10-14 MED ORDER — NEOMYCIN-POLYMYXIN-HC 1 % OT SOLN: OTIC | 1 refills | Status: DC

## 2017-10-14 NOTE — Patient Instructions (Signed)

## 2017-10-14 NOTE — Progress Notes (Signed)
She presents today states that she still has pain across the third and fourth toes of the right foot. She states that she has an ingrown toenail to the hallux left.  Objective: Pulses are palpable bilateral. Sharp radial nail margin on the femoral hallux left. She is pain on palpation with palpable Mulder's click third andof the right foot. No Pain pulses remain palpable.  Assessment: Pain in limb secondary to ingrown toenail hallux left. Neuroma third interspace right foot.  Plan: Performed, which secondary femoral border of the hallux left today she tolerated this procedure well after local anesthesia was administered she was provided with bilateral written home instructions for cancer to toe as well as a prescription for Cortisporin Otic to be applied twice daily after soaking. Also injected the third interdigital space today with Kenalog 1 anesthetic right foot.

## 2017-10-25 ENCOUNTER — Ambulatory Visit: Payer: BC Managed Care – PPO | Admitting: Podiatry

## 2017-10-27 ENCOUNTER — Encounter: Payer: Self-pay | Admitting: Podiatry

## 2017-10-27 ENCOUNTER — Ambulatory Visit: Payer: BC Managed Care – PPO | Admitting: Podiatry

## 2017-10-27 DIAGNOSIS — L600 Ingrowing nail: Secondary | ICD-10-CM | POA: Diagnosis not present

## 2017-10-27 DIAGNOSIS — L03032 Cellulitis of left toe: Secondary | ICD-10-CM

## 2017-10-27 DIAGNOSIS — D3613 Benign neoplasm of peripheral nerves and autonomic nervous system of lower limb, including hip: Secondary | ICD-10-CM

## 2017-10-27 MED ORDER — DOXYCYCLINE HYCLATE 100 MG PO TABS: 100.0000 mg | ORAL_TABLET | Freq: Two times a day (BID) | ORAL | 0 refills | Status: DC

## 2017-10-27 NOTE — Patient Instructions (Signed)

## 2017-10-27 NOTE — Progress Notes (Signed)
She presents today for follow-up of neuroma to the third interdigital space of the right foot which she states is doing much better. She does have some tenderness to the second metatarsal space of the right foot. Hallux left does do a straight mild infection because she has not been soaking it.  Objective: Vital signs are stable alert and oriented 3 mild paronychia is present cellulitis does not extend to the level of the interphalangeal joint. There is no purulence no malodor just erythema and swelling. She has pain on palpation to the second interdigital space of the right foot but not the third.  Assessment: Mild paronychia hallux left. Neuroma secondary space right.  Plan: I went ahead and injected the second interdigital space and after sterile Betadine skin prep and thorough discussion with the patient. We milligrams Kenalog and 5 mg of local anesthetic was utilized also wrote a prescription for doxycycline and I will follow-up with her in 2 weeks encouraged her soaking in Epsom salts and water.

## 2017-11-01 ENCOUNTER — Encounter: Payer: Self-pay | Admitting: Emergency Medicine

## 2017-11-01 ENCOUNTER — Emergency Department: Payer: BC Managed Care – PPO

## 2017-11-01 ENCOUNTER — Other Ambulatory Visit: Payer: Self-pay

## 2017-11-01 ENCOUNTER — Emergency Department
Admission: EM | Admit: 2017-11-01 | Discharge: 2017-11-01 | Disposition: A | Discharge status: Home / Self Care | Payer: BC Managed Care – PPO | Attending: Emergency Medicine | Admitting: Emergency Medicine

## 2017-11-01 DIAGNOSIS — F0781 Postconcussional syndrome: Secondary | ICD-10-CM | POA: Diagnosis not present

## 2017-11-01 DIAGNOSIS — E119 Type 2 diabetes mellitus without complications: Secondary | ICD-10-CM | POA: Diagnosis not present

## 2017-11-01 DIAGNOSIS — E039 Hypothyroidism, unspecified: Secondary | ICD-10-CM | POA: Insufficient documentation

## 2017-11-01 DIAGNOSIS — I10 Essential (primary) hypertension: Secondary | ICD-10-CM | POA: Insufficient documentation

## 2017-11-01 DIAGNOSIS — Z794 Long term (current) use of insulin: Secondary | ICD-10-CM | POA: Diagnosis not present

## 2017-11-01 DIAGNOSIS — Z87891 Personal history of nicotine dependence: Secondary | ICD-10-CM | POA: Insufficient documentation

## 2017-11-01 DIAGNOSIS — R260 Ataxic gait: Secondary | ICD-10-CM | POA: Diagnosis not present

## 2017-11-01 DIAGNOSIS — R51 Headache: Secondary | ICD-10-CM | POA: Diagnosis present

## 2017-11-01 LAB — BASIC METABOLIC PANEL
ANION GAP: 9 (ref 5–15)
BUN: 18 mg/dL (ref 6–20)
CHLORIDE: 104 mmol/L (ref 101–111)
CO2: 27 mmol/L (ref 22–32)
CREATININE: 0.88 mg/dL (ref 0.44–1.00)
Calcium: 9.5 mg/dL (ref 8.9–10.3)
GFR calc non Af Amer: >60 mL/min (ref >60)
Glucose, Bld: 114 mg/dL — ABNORMAL HIGH (ref 65–99)
Potassium: 4 mmol/L (ref 3.5–5.1)
SODIUM: 140 mmol/L (ref 135–145)

## 2017-11-01 LAB — CBC
HCT: 39.8 % (ref 35.0–47.0)
HEMOGLOBIN: 13.7 g/dL (ref 12.0–16.0)
MCH: 31.8 pg (ref 26.0–34.0)
MCHC: 34.4 g/dL (ref 32.0–36.0)
MCV: 92.5 fL (ref 80.0–100.0)
PLATELETS: 262 10*3/uL (ref 150–440)
RBC: 4.3 MIL/uL (ref 3.80–5.20)
RDW: 13.3 % (ref 11.5–14.5)
WBC: 11.4 10*3/uL — AB (ref 3.6–11.0)

## 2017-11-01 MED ORDER — BUTALBITAL-APAP-CAFFEINE 50-325-40 MG PO TABS: 1.0000 | ORAL_TABLET | Freq: Four times a day (QID) | ORAL | 0 refills | Status: AC | PRN

## 2017-11-01 NOTE — ED Notes (Signed)
Patient transported to CT 

## 2017-11-01 NOTE — ED Notes (Signed)
ED Provider at bedside. 

## 2017-11-01 NOTE — ED Triage Notes (Signed)
Pt brought over from [facility] with reports of having a syncopal episode on the 28th of November, also states recent fall. Pt ambulatory to stat desk with staff member of [place]. NAD.

## 2017-11-01 NOTE — ED Provider Notes (Signed)
[facility] Emergency Department Provider Note   ____________________________________________    I have reviewed the triage vital signs and the nursing notes.   HISTORY  Chief Complaint Headache    HPI Courtney Murphy is a 61 y.o. female who presents with headache.  Patient reports 3 days ago she was at an outdoor event and ordered some food, soon after eating she felt quite nauseous and went to vomit in a trash can but apparently syncopized.  Afterward she felt quite well and was able to carry on at the event.  No chest pain palpitations shortness of breath.  She reports she did hit her head when she syncopized.  She has had a mild headache since then.  No nausea.  No neuro deficits.  Does report some very mild ataxia.  No blood thinners.  Went to urgent care and they sent her to the emergency department for further evaluation   Past Medical History:  Diagnosis Date  . Acid reflux 10/11/2015  . Anemia   . Arthritis   . Depression   . Diabetes mellitus without complication ([place])   . Fatty liver   . Gout   . History of kidney stones   . Hypertension   . Hypothyroidism   . Kidney stone   . Knee pain    Left  . Pulmonary emboli ([place])   . Restless leg syndrome   . Shoulder pain     Patient Active Problem List   Diagnosis Date Noted  . Kidney stone 08/17/2017  . Type 2 diabetes mellitus without complication, without long-term current use of insulin ([place]) 04/22/2017  . Chest pain 09/30/2016  . Acid reflux 10/11/2015  . Adult hypothyroidism 10/11/2015  . ADHD (attention deficit hyperactivity disorder) 10/11/2015  . Bariatric surgery status 10/22/2014  . HLD (hyperlipidemia) 08/18/2013    Past Surgical History:  Procedure Laterality Date  . ABDOMINAL HYSTERECTOMY    . BACK SURGERY    . BREAST REDUCTION SURGERY    . CATARACT EXTRACTION W/PHACO Left 07/28/2017   Procedure: CATARACT EXTRACTION PHACO AND INTRAOCULAR LENS PLACEMENT ([place])  diabetic Left;  Surgeon: Leandrew Koyanagi, MD;  Location: [facility];  Service: Ophthalmology;  Laterality: Left;  Diabetic  . CESAREAN SECTION  1990  . CHOLECYSTECTOMY    . CYSTOSCOPY/URETEROSCOPY/HOLMIUM LASER/STENT PLACEMENT Left 09/08/2017   Procedure: CYSTOSCOPY/URETEROSCOPY/HOLMIUM LASER/STENT EXCHANGE;  Surgeon: Hollice Espy, MD;  Location: [facility];  Service: Urology;  Laterality: Left;  . FOOT SURGERY    . GASTRIC BYPASS    . IR NEPHROSTOMY PLACEMENT LEFT  08/17/2017  . KNEE ARTHROSCOPY Left   . KNEE ARTHROSCOPY Left 07/19/2015   Procedure: ARTHROSCOPY KNEE;  Surgeon: Leanor Kail, MD;  Location: [facility];  Service: Orthopedics;  Laterality: Left;  . NEPHROLITHOTOMY Left 08/17/2017   Procedure: NEPHROLITHOTOMY PERCUTANEOUS WITH HOLMIUM LASER;  Surgeon: Hollice Espy, MD;  Location: [facility];  Service: Urology;  Laterality: Left;  . THUMB ARTHROSCOPY    . TONSILLECTOMY      Prior to Admission medications   Medication Sig Start Date End Date Taking? Authorizing Provider  atomoxetine (STRATTERA) 40 MG capsule Take 1 capsule (40 mg total) by mouth 2 (two) times daily with a meal. 09/13/17  Yes Kathrine Haddock, NP  atorvastatin (LIPITOR) 20 MG tablet Take 20 mg by mouth daily.    Yes [provider]  B Complex Vitamins (VITAMIN B COMPLEX PO) Take 1 tablet by mouth daily.   Yes [provider]  Biotin 5000  MCG CAPS Take 5,000 mcg by mouth daily.   Yes [provider]  cyanocobalamin (,VITAMIN B-12,) 1000 MCG/ML injection Inject 1,000 mcg into the muscle as needed. Every 14 days as needed per doctor   Yes [provider]  doxycycline (VIBRA-TABS) 100 MG tablet Take 1 tablet (100 mg total) by mouth 2 (two) times daily. 10/27/17  Yes Hyatt, Max T, DPM  Dulaglutide (TRULICITY) 4.09 WJ/1.9JY SOPN Inject 0.5 mLs into the skin every Thursday.   Yes [provider]  escitalopram (LEXAPRO) 20 MG tablet Take 40 mg by mouth daily.    Yes  [provider]  gabapentin (NEURONTIN) 300 MG capsule Take 300 mg by mouth at bedtime as needed. For nerve pain   Yes [provider]  levothyroxine (SYNTHROID, LEVOTHROID) 100 MCG tablet Take 100 mcg by mouth daily before breakfast.   Yes [provider]  pramipexole (MIRAPEX) 0.5 MG tablet Take 0.5 mg by mouth at bedtime. May take an additional 0.5 mg for restless legs   Yes [provider]  butalbital-acetaminophen-caffeine (FIORICET, ESGIC) 50-325-40 MG tablet Take 1-2 tablets by mouth every 6 (six) hours as needed for headache. 11/01/17 11/01/18  Lavonia Drafts, MD     Allergies Sulfasalazine; Nsaids; Sulfa antibiotics; and Penicillins  Family History  Problem Relation Age of Onset  . Cancer Mother        breast  . Heart disease Father   . Diabetes Father   . Hypertension Father   . Cancer Maternal Grandmother   . Cancer Maternal Grandfather   . Heart disease Paternal Grandmother   . Heart disease Paternal Grandfather   . Cancer Maternal Aunt   . Cancer Maternal Uncle   . Heart disease Paternal Aunt   . Heart disease Paternal Uncle   . Kidney cancer Neg Hx   . Bladder Cancer Neg Hx     Social History Social History   Tobacco Use  . Smoking status: Former Smoker    Packs/day: 0.50    Types: Cigarettes    Last attempt to quit: 04/14/2015    Years since quitting: 2.5  . Smokeless tobacco: Never Used  Substance Use Topics  . Alcohol use: No  . Drug use: No    Review of Systems  Constitutional: No fever/chills Eyes: No visual changes.  ENT: No neck pain Cardiovascular: Denies chest pain. Respiratory: Denies shortness of breath. Gastrointestinal: No abdominal pain.  No nausea, no vomiting.   Genitourinary: Negative for dysuria. Musculoskeletal: Negative for back pain. Skin: Negative for rash. Neurological: Negative for focal weakness   ____________________________________________   PHYSICAL EXAM:  VITAL SIGNS: ED Triage  Vitals  Enc Vitals Group     BP 11/01/17 0838 134/73     Pulse Rate 11/01/17 0838 87     Resp 11/01/17 0838 20     Temp 11/01/17 0838 98.4 F (36.9 C)     Temp Source 11/01/17 0838 Oral     SpO2 11/01/17 0838 99 %     Weight 11/01/17 0822 81.6 kg (180 lb)     Height --      Head Circumference --      Peak Flow --      Pain Score 11/01/17 0922 4     Pain Loc --      Pain Edu? --      Excl. in [place]? --     Constitutional: Alert and oriented. No acute distress. Pleasant and interactive Eyes: Conjunctivae are normal.  Head: Atraumatic.  No laceration or abrasion or hematoma Nose: No congestion/rhinnorhea. Mouth/Throat: Mucous membranes are moist.    Cardiovascular: Normal rate, regular rhythm. Grossly normal heart sounds.  Good peripheral circulation. Respiratory: Normal respiratory effort.  No retractions.  Gastrointestinal: Soft and nontender. No distention.  No CVA tenderness. Genitourinary: deferred Musculoskeletal: No lower extremity tenderness nor edema.  Warm and well perfused Neurologic:  Normal speech and language. No gross focal neurologic deficits are appreciated.  Cranial nerves II through XII are normal Skin:  Skin is warm, dry and intact. No rash noted. Psychiatric: Mood and affect are normal. Speech and behavior are normal.  ____________________________________________   LABS (all labs ordered are listed, but only abnormal results are displayed)  Labs Reviewed  BASIC METABOLIC PANEL - Abnormal; Notable for the following components:      Result Value   Glucose, Bld 114 (*)    All other components within normal limits  CBC - Abnormal; Notable for the following components:   WBC 11.4 (*)    All other components within normal limits  CBG MONITORING, ED   ____________________________________________  EKG  ED ECG REPORT I, Lavonia Drafts, the attending physician, personally viewed and interpreted this ECG.  Date: 11/01/2017  Rhythm: normal sinus  rhythm QRS Axis: normal Intervals: normal ST/T Wave abnormalities: normal Narrative Interpretation: no evidence of acute ischemia  ____________________________________________  RADIOLOGY  She had unremarkable ____________________________________________   PROCEDURES  Procedure(s) performed: No  Procedures   Critical Care performed: No ____________________________________________   INITIAL IMPRESSION / ASSESSMENT AND PLAN / ED COURSE  Pertinent labs & imaging results that were available during my care of the patient were reviewed by me and considered in my medical decision making (see chart for details).  Patient well-appearing in no acute distress.  Symptoms most consistent with postconcussive syndrome given her mild ataxia with headache.  Headache is mild and not severe.  Imaging is unremarkable.  We will treat her headache and have her follow-up closely with her PCP.    ____________________________________________   FINAL CLINICAL IMPRESSION(S) / ED DIAGNOSES  Final diagnoses:  Post concussion syndrome        Note:  This document was prepared using Dragon voice recognition software and may include unintentional dictation errors.    Lavonia Drafts, MD 11/01/17 [phone]

## 2017-11-01 NOTE — ED Notes (Signed)
First Nurse: pt brought over from [facility] for further eval of syncopal episode back in November.

## 2017-11-04 IMAGING — CT CT ABD-PELV W/ CM
2 of 5 series · 16 of 46 positions shown, 18 images · IV contrast (APPLIED)
Comparison: January 14, 2008

CLINICAL DATA: Lower abdominal pain.

EXAM:
CT ABDOMEN AND PELVIS WITH CONTRAST
TECHNIQUE: Multidetector CT imaging of the abdomen and pelvis was performed
using the standard protocol following bolus administration of
intravenous contrast.
CONTRAST:  30mL GMJ6HL-LNN IOPAMIDOL (GMJ6HL-LNN) INJECTION 61%,
100mL GMJ6HL-LNN IOPAMIDOL (GMJ6HL-LNN) INJECTION 61%

[Series 2: routine abd/pel with · axial · 0.79mm/px · z∈[-454,-24]mm · 13 of 98 slices shown, 15 images]
[im 6/98  soft-tissue]
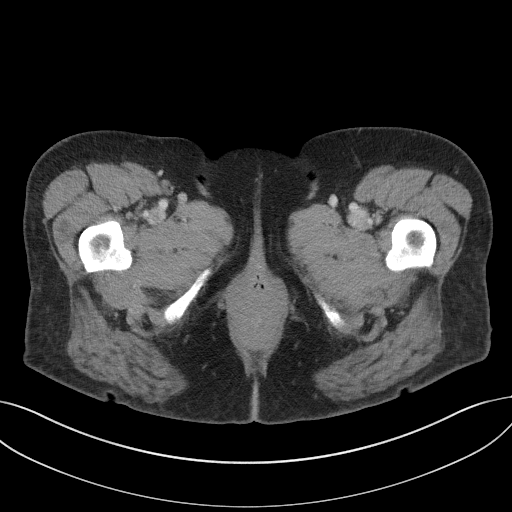
[im 6/98  bone]
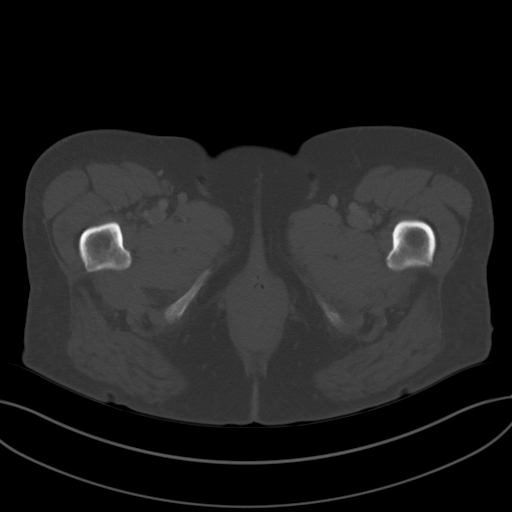
[im 11/98  soft-tissue]
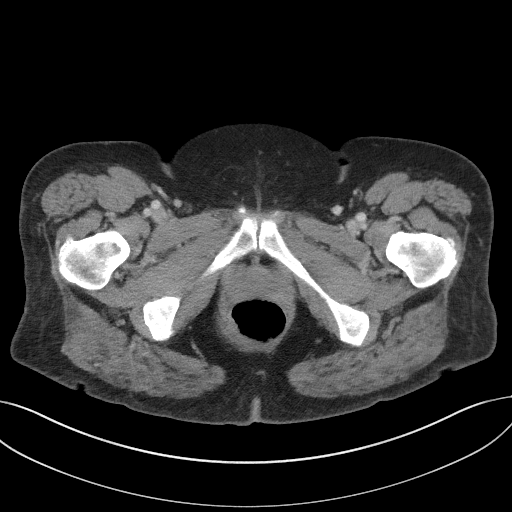
[im 22/98  soft-tissue]
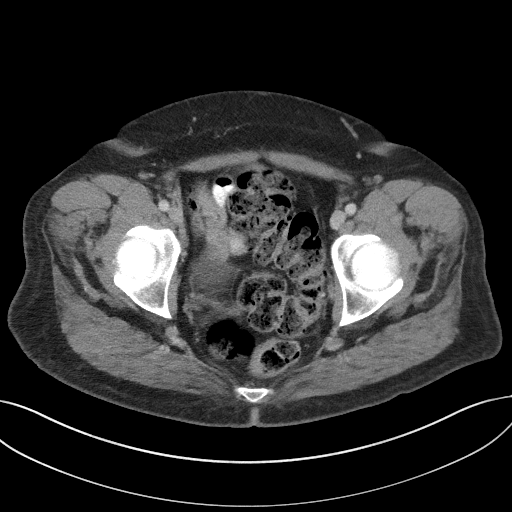
[im 27/98  soft-tissue]
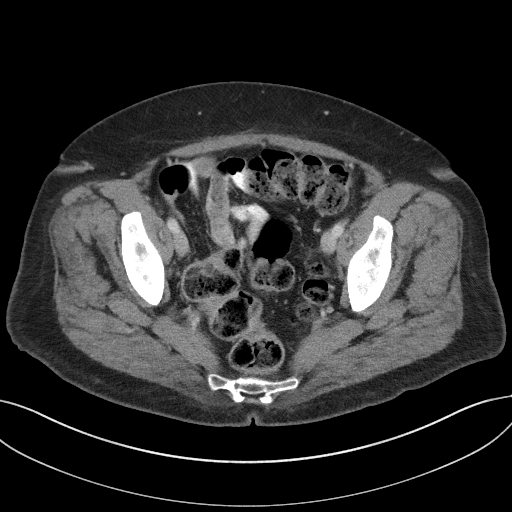
[im 33/98  soft-tissue]
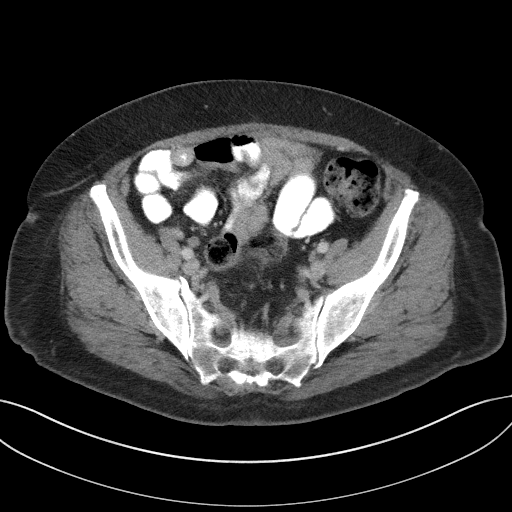
[im 44/98  soft-tissue]
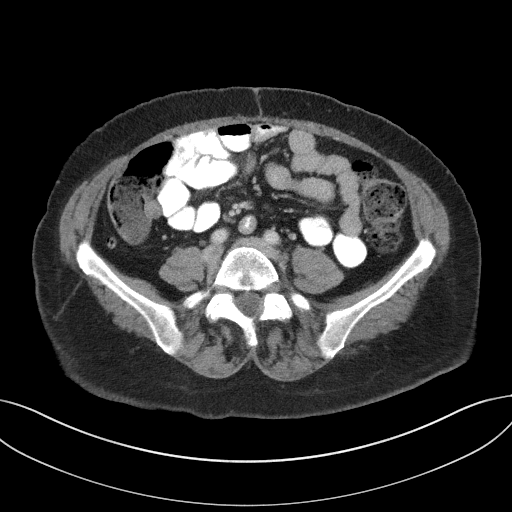
[im 49/98  soft-tissue]
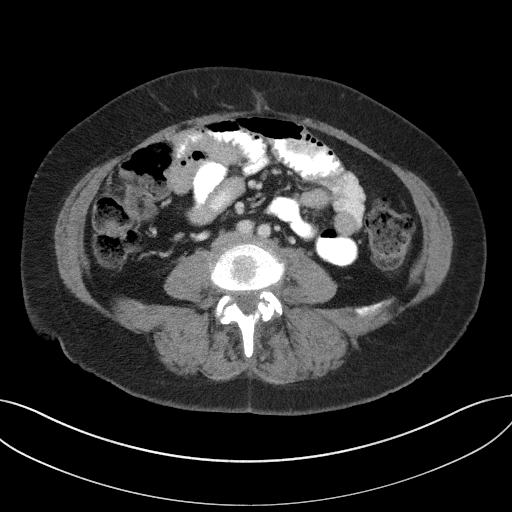
[im 54/98  soft-tissue]
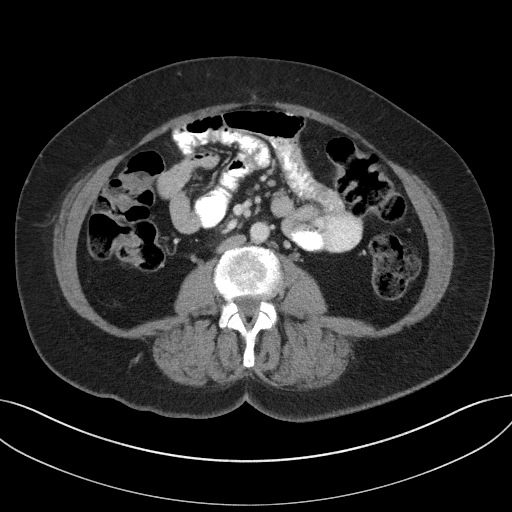
[im 65/98  soft-tissue]
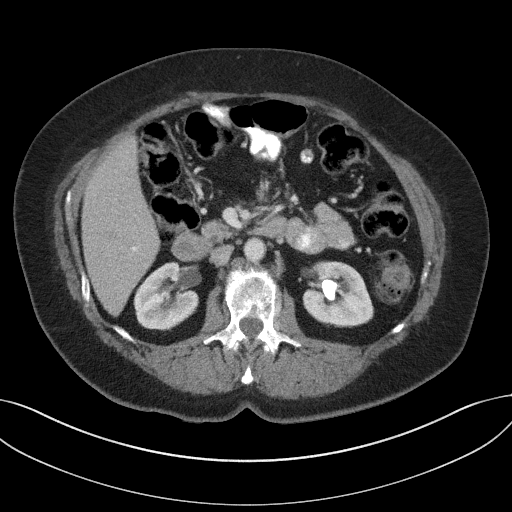
[im 65/98  bone]
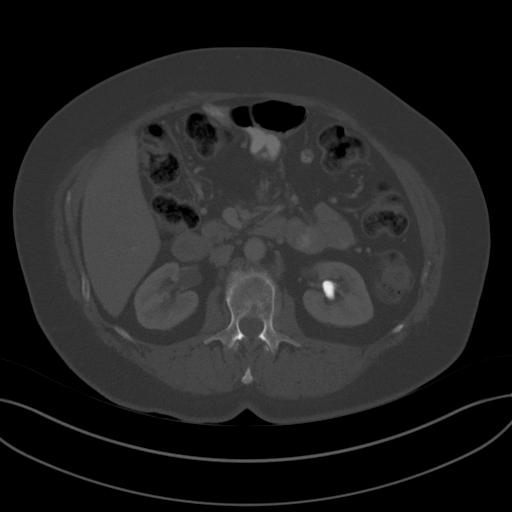
[im 71/98  soft-tissue]
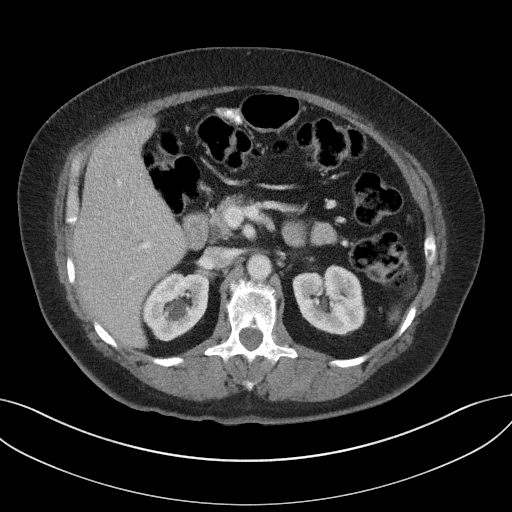
[im 76/98  soft-tissue]
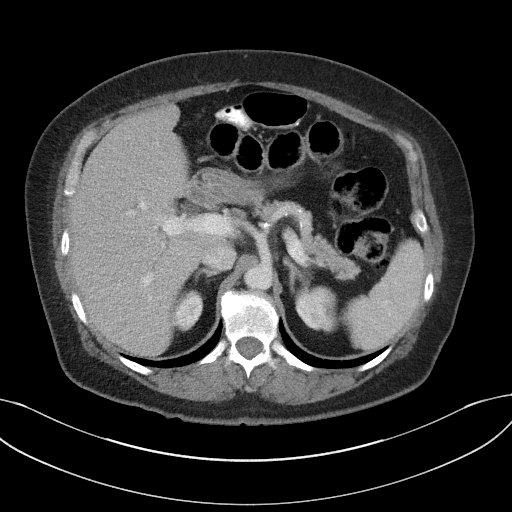
[im 87/98  soft-tissue]
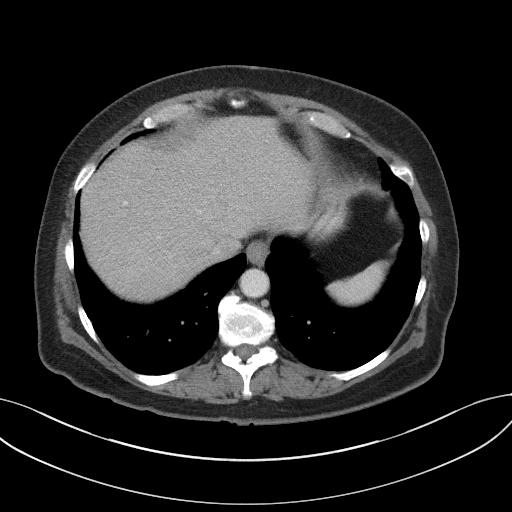
[im 92/98  soft-tissue]
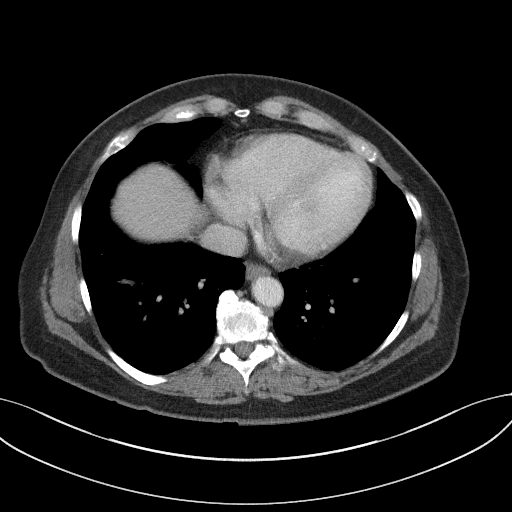

[Series 5: coronal st · coronal · 0.77mm/px · 3 of 95 slices shown]
[im 32/95  soft-tissue]
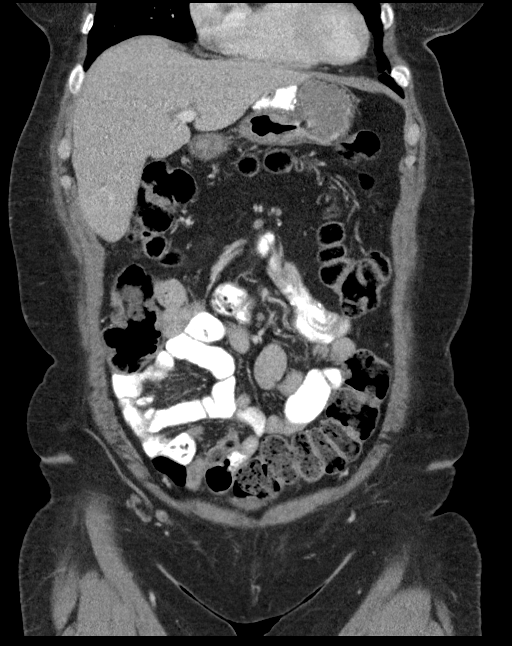
[im 42/95  soft-tissue]
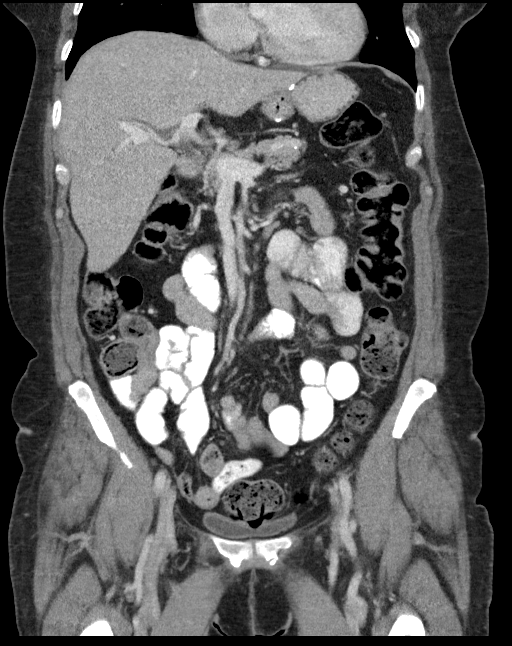
[im 53/95  soft-tissue]
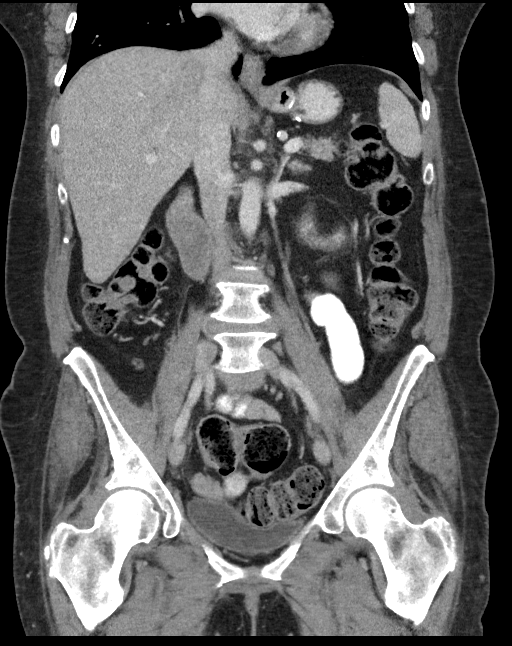

[16 of 46 positions shown; findings below may reference images not displayed]

FINDINGS: Lower chest: No acute abnormality.

Hepatobiliary: No focal liver abnormality is seen. No gallstones,
gallbladder wall thickening, or biliary dilatation.

Pancreas: Unremarkable. No pancreatic ductal dilatation or
surrounding inflammatory changes.

Spleen: Normal in size without focal abnormality.

Adrenals/Urinary Tract: There is a large staghorn calculus in the
lower pole of the left kidney measuring 3 x 1.1 cm. No other renal
stones are identified. No suspicious renal masses. No
hydronephrosis. No perinephric stranding. The ureters and bladder
are normal.

Stomach/Bowel: The patient is status post gastric bypass. The small
bowel is normal with no obstruction. Moderate fecal loading is seen
in the colon. No other colonic abnormalities. The appendix is
normal.

Vascular/Lymphatic: Minimal atherosclerotic changes seen in the
abdominal aorta. No adenopathy.

Reproductive: The patient is status post hysterectomy.

Other: No free air or free fluid.

Musculoskeletal: No acute or significant osseous findings.
IMPRESSION: 1. Staghorn calculus in the lower pole the left kidney. No
obstruction/ hydronephrosis.
2. The patient is status post gastric bypass.
3. Minimal atherosclerotic change seen in the abdominal aorta.
4. Moderate fecal loading in the colon.
Aortic Atherosclerosis ([id number].A).

## 2017-11-10 ENCOUNTER — Ambulatory Visit: Payer: BC Managed Care – PPO | Admitting: Podiatry

## 2017-11-10 ENCOUNTER — Encounter: Payer: Self-pay | Admitting: Podiatry

## 2017-11-10 DIAGNOSIS — D3613 Benign neoplasm of peripheral nerves and autonomic nervous system of lower limb, including hip: Secondary | ICD-10-CM | POA: Diagnosis not present

## 2017-11-10 DIAGNOSIS — G5762 Lesion of plantar nerve, left lower limb: Secondary | ICD-10-CM | POA: Diagnosis not present

## 2017-11-10 NOTE — Progress Notes (Signed)
She presents today for follow-up of her matrixectomy fibular border hallux left.  States that it seems to be doing fine.  She states that her right foot is doing much better as far as the neuroma however the left one is starting to kill her.  Objective: Vital signs are stable alert and oriented x3.  Pulses are palpable.  Neurologic sensorium is intact.  Deep tendon reflexes are intact.  Muscle strength was 5/5 dorsiflexors plantar flexors inverters and everters all into the musculature is intact.  Orthopedic evaluation demonstrates all joints distal to the ankle and full range of motion without crepitation.  Palpable neuromas to the third interdigital space of the right foot similarly to the left foot the surgical hallux appears to be healing uneventfully.  Assessment: Well-healing surgical foot left.  Neuroma third interdigital space bilateral.  Plan: Injected another dose of dehydrated alcohol to these areas today discussed the possible need for surgical intervention and will follow up with her in a few weeks.

## 2017-12-08 ENCOUNTER — Ambulatory Visit: Payer: BC Managed Care – PPO | Admitting: Podiatry

## 2018-03-09 ENCOUNTER — Ambulatory Visit (INDEPENDENT_AMBULATORY_CARE_PROVIDER_SITE_OTHER): Payer: BC Managed Care – PPO

## 2018-03-09 ENCOUNTER — Encounter: Payer: Self-pay | Admitting: Podiatry

## 2018-03-09 ENCOUNTER — Ambulatory Visit: Payer: BC Managed Care – PPO | Admitting: Podiatry

## 2018-03-09 DIAGNOSIS — M779 Enthesopathy, unspecified: Secondary | ICD-10-CM

## 2018-03-09 DIAGNOSIS — M84375A Stress fracture, left foot, initial encounter for fracture: Secondary | ICD-10-CM

## 2018-03-09 NOTE — Progress Notes (Signed)
Subjective:  Patient ID: TAMRYN POPKO, female    DOB: [birth year],  MRN: [id number] HPI Chief Complaint  Patient presents with  . Foot Pain    Patient presents today states her neuroma is starting to flare up again x 2-3 days.  She also c/o left lateral foot pain x 1 week.  She denies any injury, but states its a constant ache and sore when she moves her foot certain ways.  She has not taken anything to help relieve pain    62 y.o. female presents with the above complaint.   ROS: Denies fever chills nausea vomiting muscle aches pains calf pain chest pain shortness of breath.  Past Medical History:  Diagnosis Date  . Acid reflux 10/11/2015  . Anemia   . Arthritis   . Depression   . Diabetes mellitus without complication ([place])   . Fatty liver   . Gout   . History of kidney stones   . Hypertension   . Hypothyroidism   . Kidney stone   . Knee pain    Left  . Pulmonary emboli ([place])   . Restless leg syndrome   . Shoulder pain    Past Surgical History:  Procedure Laterality Date  . ABDOMINAL HYSTERECTOMY    . BACK SURGERY    . BREAST REDUCTION SURGERY    . CATARACT EXTRACTION W/PHACO Left 07/28/2017   Procedure: CATARACT EXTRACTION PHACO AND INTRAOCULAR LENS PLACEMENT ([place]) diabetic Left;  Surgeon: Leandrew Koyanagi, MD;  Location: [facility];  Service: Ophthalmology;  Laterality: Left;  Diabetic  . CESAREAN SECTION  1990  . CHOLECYSTECTOMY    . CYSTOSCOPY/URETEROSCOPY/HOLMIUM LASER/STENT PLACEMENT Left 09/08/2017   Procedure: CYSTOSCOPY/URETEROSCOPY/HOLMIUM LASER/STENT EXCHANGE;  Surgeon: Hollice Espy, MD;  Location: [facility];  Service: Urology;  Laterality: Left;  . FOOT SURGERY    . GASTRIC BYPASS    . IR NEPHROSTOMY PLACEMENT LEFT  08/17/2017  . KNEE ARTHROSCOPY Left   . KNEE ARTHROSCOPY Left 07/19/2015   Procedure: ARTHROSCOPY KNEE;  Surgeon: Leanor Kail, MD;  Location: [facility];  Service: Orthopedics;  Laterality: Left;  . NEPHROLITHOTOMY Left  08/17/2017   Procedure: NEPHROLITHOTOMY PERCUTANEOUS WITH HOLMIUM LASER;  Surgeon: Hollice Espy, MD;  Location: [facility];  Service: Urology;  Laterality: Left;  . THUMB ARTHROSCOPY    . TONSILLECTOMY      Current Outpatient Medications:  .  atomoxetine (STRATTERA) 40 MG capsule, Take 1 capsule (40 mg total) by mouth 2 (two) times daily with a meal., Disp: 180 capsule, Rfl: 1 .  atorvastatin (LIPITOR) 20 MG tablet, Take 20 mg by mouth daily. , Disp: , Rfl:  .  B Complex Vitamins (VITAMIN B COMPLEX PO), Take 1 tablet by mouth daily., Disp: , Rfl:  .  Biotin 5000 MCG CAPS, Take 5,000 mcg by mouth daily., Disp: , Rfl:  .  butalbital-acetaminophen-caffeine (FIORICET, ESGIC) 50-325-40 MG tablet, Take 1-2 tablets by mouth every 6 (six) hours as needed for headache., Disp: 20 tablet, Rfl: 0 .  cyanocobalamin (,VITAMIN B-12,) 1000 MCG/ML injection, Inject 1,000 mcg into the muscle as needed. Every 14 days as needed per doctor, Disp: , Rfl:  .  doxycycline (VIBRA-TABS) 100 MG tablet, Take 1 tablet (100 mg total) by mouth 2 (two) times daily., Disp: 20 tablet, Rfl: 0 .  Dulaglutide (TRULICITY) 8.29 HB/7.1IR SOPN, Inject 0.5 mLs into the skin every Thursday., Disp: , Rfl:  .  escitalopram (LEXAPRO) 20 MG tablet, Take 40 mg by mouth daily. , Disp: , Rfl:  .  gabapentin (NEURONTIN) 300 MG capsule, Take 300 mg by mouth at bedtime as needed. For nerve pain, Disp: , Rfl:  .  levothyroxine (SYNTHROID, LEVOTHROID) 100 MCG tablet, Take 100 mcg by mouth daily before breakfast., Disp: , Rfl:  .  pramipexole (MIRAPEX) 0.5 MG tablet, Take 0.5 mg by mouth at bedtime. May take an additional 0.5 mg for restless legs, Disp: , Rfl:   Allergies  Allergen Reactions  . Sulfasalazine Hives  . 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 Sulfa Antibiotics Hives  . Penicillins Hives and Rash    Has patient had a PCN reaction causing immediate rash, facial/tongue/throat swelling, SOB or lightheadedness with hypotension:  No Has patient had a PCN reaction causing severe rash involving mucus membranes or skin necrosis: No Has patient had a PCN reaction that required hospitalization: No Has patient had a PCN reaction occurring within the last 10 years: No If all of the above answers are "NO", then may proceed with Cephalosporin use.    Review of Systems Objective:  There were no vitals filed for this visit.  General: Well developed, nourished, in no acute distress, alert and oriented x3   Dermatological: Skin is warm, dry and supple bilateral. Nails x 10 are well maintained; remaining integument appears unremarkable at this time. There are no open sores, no preulcerative lesions, no rash or signs of infection present.  Vascular: Dorsalis Pedis artery and Posterior Tibial artery pedal pulses are 2/4 bilateral with immedate capillary fill time. Pedal hair growth present. No varicosities and no lower extremity edema present bilateral.   Neruologic: Grossly intact via light touch bilateral. Vibratory intact via tuning fork bilateral. Protective threshold with Semmes Wienstein monofilament intact to all pedal sites bilateral. Patellar and Achilles deep tendon reflexes 2+ bilateral. No Babinski or clonus noted bilateral.   Musculoskeletal: No gross boney pedal deformities bilateral. No pain, crepitus, or limitation noted with foot and ankle range of motion bilateral. Muscular strength 5/5 in all groups tested bilateral.  Swelling and mild erythema overlying the dorsal lateral aspect of the left foot.  Tendons appear to be intact without pain on motion.  She does have tenderness on direct palpation of the cuboid bone itself from plantar as well as dorsal lateral.  Gait: Unassisted, Nonantalgic.    Radiographs:  Radiographs taken today demonstrate what appears to be significant soft tissue swelling over the peroneal tendons.  However we will look a little closer it appears that there may be a stress fracture of the  cuboid bone.  This corresponds to direct palpation.  Assessment & Plan:   Assessment: Probable stress fracture cuboid bone left.    Plan: Placed in a compression anklet in a Cam walker we will follow up with her in 1 week for another set of x-rays if necessary.  She will start anti-inflammatories at home.     Jniyah Dantuono T. Sikes, [place]

## 2018-03-16 ENCOUNTER — Encounter: Payer: Self-pay | Admitting: Podiatry

## 2018-03-16 ENCOUNTER — Ambulatory Visit (INDEPENDENT_AMBULATORY_CARE_PROVIDER_SITE_OTHER): Payer: BC Managed Care – PPO | Admitting: Podiatry

## 2018-03-16 ENCOUNTER — Ambulatory Visit (INDEPENDENT_AMBULATORY_CARE_PROVIDER_SITE_OTHER): Payer: BC Managed Care – PPO

## 2018-03-16 DIAGNOSIS — M84375D Stress fracture, left foot, subsequent encounter for fracture with routine healing: Secondary | ICD-10-CM

## 2018-03-16 NOTE — Progress Notes (Signed)
She presents today after were concerned about a stress fracture through the cuboid of the left foot.  She states that is sore if I do not have the boot on both lungs have the boot on is very tolerable.  Objective: Vital signs are stable she is alert and oriented x3 there is no erythematous mild edema along the lateral aspect of the foot radiographs taken once again today do demonstrate what appears to be a fracture of the proximal and distal portion of the cuboid bone with some fragmentation.  This is more than likely a nutcracker type injury.  Assessment: Fractured cuboid left.  Plan: Follow-up with her in 3 weeks for set of x-rays.

## 2018-04-06 ENCOUNTER — Encounter

## 2018-04-06 ENCOUNTER — Ambulatory Visit: Payer: BC Managed Care – PPO | Admitting: Podiatry

## 2018-04-06 ENCOUNTER — Ambulatory Visit (INDEPENDENT_AMBULATORY_CARE_PROVIDER_SITE_OTHER): Payer: BC Managed Care – PPO

## 2018-04-06 DIAGNOSIS — S86302D Unspecified injury of muscle(s) and tendon(s) of peroneal muscle group at lower leg level, left leg, subsequent encounter: Secondary | ICD-10-CM

## 2018-04-06 DIAGNOSIS — M84375D Stress fracture, left foot, subsequent encounter for fracture with routine healing: Secondary | ICD-10-CM

## 2018-04-06 NOTE — Progress Notes (Signed)
She presents today states my foot is hurting so badly now can hardly stand it.  She states that it still hurts with the boot even on now.  Objective: Vital signs are stable she is alert and oriented x3 lateral swelling to the lateral aspect of the left foot cuboid area still exquisitely painful though she does now have severe pain on palpation of the peroneus longus as it courses beneath the arcuate portion of the cuboid.  Cannot rule out a tear at this area.  Assessment: Worsening pain cuboid fracture visible on radiograph and possible tear of the [place] longus.  Plan: At this point requesting an MRI for soft tissue evaluation and further evaluation of the cuboid.  I will follow-up with her once this is been completed.

## 2018-04-07 ENCOUNTER — Telehealth: Payer: Self-pay

## 2018-04-07 NOTE — Telephone Encounter (Signed)
Per Christoper Fabian with [facility], MRI has been approved from 04/07/18 to 05/06/18 Auth # [id number] Patient has been notified of approval and will call to schedule own appt

## 2018-04-07 NOTE — Addendum Note (Signed)
Addended by: Graceann Congress D on: 04/07/2018 11:27 AM   Modules accepted: Orders

## 2018-04-13 ENCOUNTER — Ambulatory Visit: Payer: BC Managed Care – PPO | Admitting: Urology

## 2018-04-14 ENCOUNTER — Ambulatory Visit
Admission: RE | Admit: 2018-04-14 | Discharge: 2018-04-14 | Disposition: A | Discharge status: Home / Self Care | Payer: BC Managed Care – PPO | Source: Ambulatory Visit | Attending: Podiatry | Admitting: Podiatry

## 2018-04-14 ENCOUNTER — Telehealth: Payer: Self-pay | Admitting: Podiatry

## 2018-04-14 DIAGNOSIS — S86302D Unspecified injury of muscle(s) and tendon(s) of peroneal muscle group at lower leg level, left leg, subsequent encounter: Secondary | ICD-10-CM

## 2018-04-14 DIAGNOSIS — X58XXXD Exposure to other specified factors, subsequent encounter: Secondary | ICD-10-CM | POA: Diagnosis not present

## 2018-04-14 DIAGNOSIS — M84375D Stress fracture, left foot, subsequent encounter for fracture with routine healing: Secondary | ICD-10-CM | POA: Diagnosis present

## 2018-04-14 MED ORDER — TRAMADOL HCL 50 MG PO TABS: 50.0000 mg | ORAL_TABLET | Freq: Four times a day (QID) | ORAL | 0 refills | Status: DC | PRN

## 2018-04-14 NOTE — Addendum Note (Signed)
Addended by: Graceann Congress D on: 04/14/2018 05:26 PM   Modules accepted: Orders

## 2018-04-14 NOTE — Telephone Encounter (Signed)
I'm a pt of Dr. Stephenie Acres and went this morning for the MRI on my left foot. I wanted to see if he could possibly call me in something for pain. The pain has gotten much worse the past few days. I've tried just about everything I can think of such as tylenol, Aleve, ibuprofen, ice, and staying off my foot. Nothing, nothing is helping the pain. It hurts when I walk, when I lay down, when I sleep, when I do anything. Please call me back at [phone]. Thank you.

## 2018-04-14 NOTE — Telephone Encounter (Signed)
I spoke with patient, she stated that she is in a lot of pain and nothing that she has tried has helped with the pain.  I spoke with Dr. Prudence Davidson regarding her pain. Per his verbal order, ok to call in tramadol 50mg  #20.  Patient has been informed of medication and will call back if foot is no better next week.

## 2018-04-18 ENCOUNTER — Telehealth: Payer: Self-pay | Admitting: Podiatry

## 2018-04-18 NOTE — Telephone Encounter (Signed)
I spoke with patient again regarding MRI results and informed her that they have not been reviewed by Dr. Milinda Pointer yet.  She stated that the Tramadol has not helped with the pain and her foot is still swelling really bad and is still very painful.  She informed me that she has tried Aleve, Ibuprofen, soaking in Epson salt, Tylenol, ice, heat and nothing is working and she is getting tired of the pain.  I informed her that we will get her in to see the physician.  She was sent to scheduling to see Dr. Milinda Pointer on Wednesday

## 2018-04-18 NOTE — Telephone Encounter (Signed)
Patient is calling about the results of her MRI. Please give her a call.

## 2018-04-19 ENCOUNTER — Telehealth: Payer: Self-pay | Admitting: *Deleted

## 2018-04-19 NOTE — Telephone Encounter (Signed)
-----   Message from Garrel Ridgel, [place] sent at 04/19/2018  7:16 AM EDT ----- Stress injury of the cuboid and should continue to wear her boot .  I will fu with her in a month.

## 2018-04-19 NOTE — Telephone Encounter (Signed)
I informed Courtney Murphy of Dr. Stephenie Acres review of results and orders. Courtney Murphy states she has an appt tomorrow for the pain, states she is in a great deal of pain and wants to be evaluated. I told Courtney Murphy that was a good idea.

## 2018-04-20 ENCOUNTER — Ambulatory Visit: Payer: BC Managed Care – PPO | Admitting: Podiatry

## 2018-04-20 ENCOUNTER — Encounter: Payer: Self-pay | Admitting: Podiatry

## 2018-04-20 DIAGNOSIS — S92215G Nondisplaced fracture of cuboid bone of left foot, subsequent encounter for fracture with delayed healing: Secondary | ICD-10-CM

## 2018-04-20 MED ORDER — OXYCODONE-ACETAMINOPHEN 10-325 MG PO TABS: 1.0000 | ORAL_TABLET | ORAL | 0 refills | Status: DC | PRN

## 2018-04-20 NOTE — Progress Notes (Signed)
She presents today for follow-up of her fractured cuboid bone left foot.  Presents ambulating with her Cam walker never walking without it.  States that the pain is intense she can hardly bear to put any weight on it.  She denies any calf pain.  Objective: Vital signs are stable she is alert and oriented x3.  MRI does state of intraosseous stress reaction with severe edema to the cuboid and possible fracture left.  Nondisplaced non-comminuted she has swelling and tenderness on palpation of the left foot overlying the cuboid.  Exquisitely painful.  Assessment: Pain in limb secondary to fracture cuboid bone left.  Plan: Discussed with her in great detail today the extended time this can take for this to heal.  I also discussed nonweightbearing status with her today I feel that is going to be best if we casted the foot and put her in a nonweightbearing stance utilizing crutches or knee scooter.  She would rather continue to use her Cam walker in a nonweightbearing stance because it is easier to wash and keep clean.  I will follow-up with her in 1 month for another set of x-rays.  I did write a prescription for Percocet.

## 2018-04-22 ENCOUNTER — Ambulatory Visit: Payer: BC Managed Care – PPO | Admitting: Urology

## 2018-04-22 ENCOUNTER — Emergency Department: Payer: BC Managed Care – PPO

## 2018-04-22 ENCOUNTER — Emergency Department
Admission: EM | Admit: 2018-04-22 | Discharge: 2018-04-22 | Disposition: A | Discharge status: Home / Self Care | Payer: BC Managed Care – PPO | Attending: Emergency Medicine | Admitting: Emergency Medicine

## 2018-04-22 ENCOUNTER — Other Ambulatory Visit: Payer: Self-pay

## 2018-04-22 DIAGNOSIS — S8992XA Unspecified injury of left lower leg, initial encounter: Secondary | ICD-10-CM | POA: Diagnosis present

## 2018-04-22 DIAGNOSIS — M7652 Patellar tendinitis, left knee: Secondary | ICD-10-CM | POA: Insufficient documentation

## 2018-04-22 DIAGNOSIS — Y9389 Activity, other specified: Secondary | ICD-10-CM | POA: Diagnosis not present

## 2018-04-22 DIAGNOSIS — I10 Essential (primary) hypertension: Secondary | ICD-10-CM | POA: Diagnosis not present

## 2018-04-22 DIAGNOSIS — Z79899 Other long term (current) drug therapy: Secondary | ICD-10-CM | POA: Insufficient documentation

## 2018-04-22 DIAGNOSIS — Z87891 Personal history of nicotine dependence: Secondary | ICD-10-CM | POA: Diagnosis not present

## 2018-04-22 DIAGNOSIS — S8002XA Contusion of left knee, initial encounter: Secondary | ICD-10-CM | POA: Diagnosis not present

## 2018-04-22 DIAGNOSIS — Y998 Other external cause status: Secondary | ICD-10-CM | POA: Diagnosis not present

## 2018-04-22 DIAGNOSIS — W01198A Fall on same level from slipping, tripping and stumbling with subsequent striking against other object, initial encounter: Secondary | ICD-10-CM | POA: Insufficient documentation

## 2018-04-22 DIAGNOSIS — Y92480 Sidewalk as the place of occurrence of the external cause: Secondary | ICD-10-CM | POA: Diagnosis not present

## 2018-04-22 DIAGNOSIS — E039 Hypothyroidism, unspecified: Secondary | ICD-10-CM | POA: Insufficient documentation

## 2018-04-22 DIAGNOSIS — E119 Type 2 diabetes mellitus without complications: Secondary | ICD-10-CM | POA: Insufficient documentation

## 2018-04-22 NOTE — Discharge Instructions (Signed)
Please rest ice and elevate the left knee.  Continue with nonweightbearing on the left lower extremity with either crutches or knee scooter.  He may take Aleve or Tylenol as needed for pain.  If no improvement by middle of next week, follow-up with primary care provider or your orthopedist.

## 2018-04-22 NOTE — ED Triage Notes (Signed)
Patient had a knee scooter due to fractured foot.  Tonight using scooter she fell onto left knee.

## 2018-04-22 NOTE — ED Notes (Signed)

## 2018-04-22 NOTE — ED Provider Notes (Signed)
[facility] EMERGENCY DEPARTMENT Provider Note   CSN: [id number] Arrival date & time: 04/22/18  2006     History   Chief Complaint Chief Complaint  Patient presents with  . Knee Pain    HPI Courtney Murphy is a 62 y.o. female.  Presents to the emergency department for evaluation of left knee pain.  Patient was recently diagnosed with stress fracture to her cuboid and her left foot.  She has been in a boot and recently advised to go nonweightbearing.  She purchased a knee scooter and was riding this today in downtown [place] when she stumbled, fell forward and landed on her left knee.  Patient complains of pain to the anterior aspect of the left knee along the patellar tendon and inferior pole of patella.  She is able to straight leg raise.  Her pain is moderate but increased with touch.  She has not had any medications for pain.  Pain is very mild when she is not bending the knee.  She denies any other injury to her body.  HPI  Past Medical History:  Diagnosis Date  . Acid reflux 10/11/2015  . Anemia   . Arthritis   . Depression   . Diabetes mellitus without complication ([place])   . Fatty liver   . Gout   . History of kidney stones   . Hypertension   . Hypothyroidism   . Kidney stone   . Knee pain    Left  . Pulmonary emboli ([place])   . Restless leg syndrome   . Shoulder pain     Patient Active Problem List   Diagnosis Date Noted  . Kidney stone 08/17/2017  . Type 2 diabetes mellitus without complication, without long-term current use of insulin ([place]) 04/22/2017  . Chest pain 09/30/2016  . Acid reflux 10/11/2015  . Adult hypothyroidism 10/11/2015  . ADHD (attention deficit hyperactivity disorder) 10/11/2015  . Bariatric surgery status 10/22/2014  . HLD (hyperlipidemia) 08/18/2013    Past Surgical History:  Procedure Laterality Date  . ABDOMINAL HYSTERECTOMY    . BACK SURGERY    . BREAST REDUCTION SURGERY    . CATARACT EXTRACTION W/PHACO  Left 07/28/2017   Procedure: CATARACT EXTRACTION PHACO AND INTRAOCULAR LENS PLACEMENT ([place]) diabetic Left;  Surgeon: Leandrew Koyanagi, MD;  Location: [facility];  Service: Ophthalmology;  Laterality: Left;  Diabetic  . CESAREAN SECTION  1990  . CHOLECYSTECTOMY    . CYSTOSCOPY/URETEROSCOPY/HOLMIUM LASER/STENT PLACEMENT Left 09/08/2017   Procedure: CYSTOSCOPY/URETEROSCOPY/HOLMIUM LASER/STENT EXCHANGE;  Surgeon: Hollice Espy, MD;  Location: [facility];  Service: Urology;  Laterality: Left;  . FOOT SURGERY    . GASTRIC BYPASS    . IR NEPHROSTOMY PLACEMENT LEFT  08/17/2017  . KNEE ARTHROSCOPY Left   . KNEE ARTHROSCOPY Left 07/19/2015   Procedure: ARTHROSCOPY KNEE;  Surgeon: Leanor Kail, MD;  Location: [facility];  Service: Orthopedics;  Laterality: Left;  . NEPHROLITHOTOMY Left 08/17/2017   Procedure: NEPHROLITHOTOMY PERCUTANEOUS WITH HOLMIUM LASER;  Surgeon: Hollice Espy, MD;  Location: [facility];  Service: Urology;  Laterality: Left;  . THUMB ARTHROSCOPY    . TONSILLECTOMY       OB History   None      Home Medications    Prior to Admission medications   Medication Sig Start Date End Date Taking? Authorizing Provider  atomoxetine (STRATTERA) 40 MG capsule Take 1 capsule (40 mg total) by mouth 2 (two) times daily with a meal. 09/13/17   Kathrine Haddock, NP  atorvastatin (  LIPITOR) 20 MG tablet Take 20 mg by mouth daily.     [provider]  B Complex Vitamins (VITAMIN B COMPLEX PO) Take 1 tablet by mouth daily.    [provider]  Biotin 5000 MCG CAPS Take 5,000 mcg by mouth daily.    [provider]  butalbital-acetaminophen-caffeine (FIORICET, ESGIC) 50-325-40 MG tablet Take 1-2 tablets by mouth every 6 (six) hours as needed for headache. 11/01/17 11/01/18  Lavonia Drafts, MD  cyanocobalamin (,VITAMIN B-12,) 1000 MCG/ML injection Inject 1,000 mcg into the muscle as needed. Every 14 days as needed per doctor    [provider]  doxycycline  (VIBRA-TABS) 100 MG tablet Take 1 tablet (100 mg total) by mouth 2 (two) times daily. 10/27/17   Hyatt, Max T, DPM  Dulaglutide (TRULICITY) 4.78 GN/5.6OZ SOPN Inject 0.5 mLs into the skin every Thursday.    [provider]  escitalopram (LEXAPRO) 20 MG tablet Take 40 mg by mouth daily.     [provider]  gabapentin (NEURONTIN) 300 MG capsule Take 300 mg by mouth at bedtime as needed. For nerve pain    [provider]  levothyroxine (SYNTHROID, LEVOTHROID) 100 MCG tablet Take 100 mcg by mouth daily before breakfast.    [provider]  oxyCODONE-acetaminophen (PERCOCET) 10-325 MG tablet Take 1 tablet by mouth every 4 (four) hours as needed for pain. 04/20/18   Hyatt, Max T, DPM  pramipexole (MIRAPEX) 0.5 MG tablet Take 0.5 mg by mouth at bedtime. May take an additional 0.5 mg for restless legs    [provider]  traMADol (ULTRAM) 50 MG tablet Take 1 tablet (50 mg total) by mouth every 6 (six) hours as needed. 04/14/18   Gardiner Barefoot, DPM    Family History Family History  Problem Relation Age of Onset  . Cancer Mother        breast  . Heart disease Father   . Diabetes Father   . Hypertension Father   . Cancer Maternal Grandmother   . Cancer Maternal Grandfather   . Heart disease Paternal Grandmother   . Heart disease Paternal Grandfather   . Cancer Maternal Aunt   . Cancer Maternal Uncle   . Heart disease Paternal Aunt   . Heart disease Paternal Uncle   . Kidney cancer Neg Hx   . Bladder Cancer Neg Hx     Social History Social History   Tobacco Use  . Smoking status: Former Smoker    Packs/day: 0.50    Types: Cigarettes    Last attempt to quit: 04/14/2015    Years since quitting: 3.0  . Smokeless tobacco: Never Used  Substance Use Topics  . Alcohol use: No  . Drug use: No     Allergies   Sulfasalazine; Nsaids; Sulfa antibiotics; and Penicillins   Review of Systems Review of Systems  Constitutional: Negative for activity  change.  Eyes: Negative for pain and visual disturbance.  Respiratory: Negative for shortness of breath.   Cardiovascular: Negative for chest pain and leg swelling.  Gastrointestinal: Negative for abdominal pain.  Genitourinary: Negative for flank pain and pelvic pain.  Musculoskeletal: Positive for arthralgias and gait problem. Negative for joint swelling, myalgias, neck pain and neck stiffness.  Skin: Negative for wound.  Neurological: Negative for dizziness, syncope, weakness, light-headedness, numbness and headaches.  Psychiatric/Behavioral: Negative for confusion and decreased concentration.     Physical Exam Updated Vital Signs BP (!) 147/93 (BP Location: Left Arm)   Pulse 83   Temp  98.3 F (36.8 C) (Oral)   Resp 20   Ht 5\' 4"  (1.626 m)   Wt 81.6 kg (180 lb)   SpO2 99%   BMI 30.90 kg/m   Physical Exam  Constitutional: She is oriented to person, place, and time. She appears well-developed and well-nourished.  HENT:  Head: Normocephalic and atraumatic.  Right Ear: External ear normal.  Left Ear: External ear normal.  Nose: Nose normal.  Eyes: Pupils are equal, round, and reactive to light. Conjunctivae and EOM are normal.  Neck: Normal range of motion.  Cardiovascular: Normal rate.  Pulmonary/Chest: Effort normal. No respiratory distress.  Musculoskeletal: Normal range of motion. She exhibits no edema or deformity.  Left knee shows patient is able to perform full active extension defect in the patellar tendon or quad tendon.  Patella is slightly tender along the inferior pole patella.  Patient has minimal tenderness along the patellar tendon.  Knee is stable to valgus and varus stress testing.  She has full range of motion of the hip with no discomfort.  Neurological: She is alert and oriented to person, place, and time. No cranial nerve deficit. Coordination normal.  Skin: Skin is warm and dry. No rash noted.  Psychiatric: She has a normal mood and affect. Her behavior  is normal.     ED Treatments / Results  Labs (all labs ordered are listed, but only abnormal results are displayed) Labs Reviewed - No data to display  EKG None  Radiology Dg Knee Complete 4 Views Left  Result Date: 04/22/2018 CLINICAL DATA:  Left knee pain after a fall today. Patient is using a knee scooter due to a fractured foot. EXAM: LEFT KNEE - COMPLETE 4+ VIEW COMPARISON:  04/27/2015 FINDINGS: Mild degenerative changes in the left knee with medial compartment narrowing and tiny hypertrophic changes. There is suggestion of a rounded osseous lesion in the anterior medial compartment which could represent an osteophyte or a loose body. No donor site is identified. Otherwise, no focal bone lesion or bone destruction identified. Bone cortex appears intact. No significant effusion. There is some infiltration in the subcutaneous fat anterior to the knee with laxity of the patellar ligament. This could indicate soft tissue or ligamentous injury. Consider MRI knee for further evaluation if physical examination suggests ligamentous injury. IMPRESSION: 1. Mild degenerative changes in the medial compartment of the left knee. 2. Osteophyte versus loose body in the medial compartment. 3. Possible soft tissue or ligamentous injury anterior to the left knee and involving the patellar ligament. 4. No acute fracture or dislocation. Electronically Signed   By: Lucienne Capers M.D.   On: 04/22/2018 21:47    Procedures Procedures (including critical care time)  Medications Ordered in ED Medications - No data to display   Initial Impression / Assessment and Plan / ED Course  I have reviewed the triage vital signs and the nursing notes.  Pertinent labs & imaging results that were available during my care of the patient were reviewed by me and considered in my medical decision making (see chart for details).     62 year old female with contusion to the left anterior knee along the patellar tendon.   No evidence of acute bony abnormality or patellar tendon rupture.  Patient states she will be fine taking Aleve at home.  So I recommend Aleve 1 to 2 tablets twice daily as needed with food.  Patient will rest ice and elevate the knee.  She will continue to be nonweightbearing on the left lower  extremity to help with he cuboid stress fracture.  She will follow-up with orthopedist in 5 to 7 days if no improvement.  Final Clinical Impressions(s) / ED Diagnoses   Final diagnoses:  Contusion of left knee, initial encounter  Patellar tendinitis, left knee    ED Discharge Orders    None       Renata Caprice 04/22/18 2219    Delman Kitten, MD 04/24/18 1606

## 2018-04-27 ENCOUNTER — Ambulatory Visit
Admission: RE | Admit: 2018-04-27 | Discharge: 2018-04-27 | Disposition: A | Discharge status: Home / Self Care | Payer: BC Managed Care – PPO | Source: Ambulatory Visit | Attending: Urology | Admitting: Urology

## 2018-04-27 DIAGNOSIS — N200 Calculus of kidney: Secondary | ICD-10-CM | POA: Diagnosis not present

## 2018-05-18 ENCOUNTER — Telehealth: Payer: Self-pay | Admitting: Podiatry

## 2018-05-18 NOTE — Telephone Encounter (Signed)
I returned call to [facility], diagnosis code was given

## 2018-05-18 NOTE — Telephone Encounter (Signed)
This is [employer]. Dr. Milinda Pointer wrote a prescription for crutches for Ms. Veasey and did not include the diagnosis. If you would call us with a diagnosis code we would appreciate it. Thank you. Bye.

## 2018-05-25 ENCOUNTER — Encounter: Payer: BC Managed Care – PPO | Admitting: Podiatry

## 2018-05-25 ENCOUNTER — Ambulatory Visit: Payer: BC Managed Care – PPO | Admitting: Podiatry

## 2018-05-26 ENCOUNTER — Encounter: Payer: Self-pay | Admitting: Urology

## 2018-05-26 ENCOUNTER — Ambulatory Visit: Payer: BC Managed Care – PPO | Admitting: Urology

## 2018-05-26 VITALS — BP 160/85 | HR 80 | Ht 64.0 in | Wt 180.0 lb

## 2018-05-26 DIAGNOSIS — N200 Calculus of kidney: Secondary | ICD-10-CM | POA: Diagnosis not present

## 2018-05-26 NOTE — Progress Notes (Signed)
05/26/2018 8:32 PM   Courtney Murphy 09/27/1956 [id number]  Referring provider: Audelia Acton, MD No address on file  Chief Complaint  Patient presents with  . Nephrolithiasis    77month    HPI: 62 year old female with a personal history of nephrolithiasis returns today for routine follow-up.  She has a personal history of 3 x 1.1 cm left renal stone s/p left PCNL on 08/18/2017 followed by staged left ureteroscopy on 09/08/2017 for treatment of innumerable left ureteral stone fragments which were cleared.    She denies any urinary symptoms today.  No flank pain.  No interval stone episodes.    Stone analysis consistent with 70% calcium oxalate monohydrate, 30% calcium phosphate stone.  She does have a history of gastric bypass.  Since last visit, she has dramatically increased her water intake, cut back on sodium and meat.  She has been following guidelines for stone prevention.  KUB today does show calcifications overlying the left lower pole renal calculi.  In comparison, previous RUS on 09/21/2017 did small non obstructing stone.   PMH: Past Medical History:  Diagnosis Date  . Acid reflux 10/11/2015  . Anemia   . Arthritis   . Depression   . Diabetes mellitus without complication ([place])   . Fatty liver   . Gout   . History of kidney stones   . Hypertension   . Hypothyroidism   . Kidney stone   . Knee pain    Left  . Pulmonary emboli ([place])   . Restless leg syndrome   . Shoulder pain     Surgical History: Past Surgical History:  Procedure Laterality Date  . ABDOMINAL HYSTERECTOMY    . BACK SURGERY    . BREAST REDUCTION SURGERY    . CATARACT EXTRACTION W/PHACO Left 07/28/2017   Procedure: CATARACT EXTRACTION PHACO AND INTRAOCULAR LENS PLACEMENT ([place]) diabetic Left;  Surgeon: Leandrew Koyanagi, MD;  Location: [facility];  Service: Ophthalmology;  Laterality: Left;  Diabetic  . CESAREAN SECTION  1990  . CHOLECYSTECTOMY    .  CYSTOSCOPY/URETEROSCOPY/HOLMIUM LASER/STENT PLACEMENT Left 09/08/2017   Procedure: CYSTOSCOPY/URETEROSCOPY/HOLMIUM LASER/STENT EXCHANGE;  Surgeon: Hollice Espy, MD;  Location: [facility];  Service: Urology;  Laterality: Left;  . FOOT SURGERY    . GASTRIC BYPASS    . IR NEPHROSTOMY PLACEMENT LEFT  08/17/2017  . KNEE ARTHROSCOPY Left   . KNEE ARTHROSCOPY Left 07/19/2015   Procedure: ARTHROSCOPY KNEE;  Surgeon: Leanor Kail, MD;  Location: [facility];  Service: Orthopedics;  Laterality: Left;  . NEPHROLITHOTOMY Left 08/17/2017   Procedure: NEPHROLITHOTOMY PERCUTANEOUS WITH HOLMIUM LASER;  Surgeon: Hollice Espy, MD;  Location: [facility];  Service: Urology;  Laterality: Left;  . THUMB ARTHROSCOPY    . TONSILLECTOMY      Home Medications:  Allergies as of 05/26/2018      Reactions   Sulfasalazine Hives   Nsaids    Avoids because of gastric bypass   Sulfa Antibiotics Hives   Penicillins Hives, Rash   Has patient had a PCN reaction causing immediate rash, facial/tongue/throat swelling, SOB or lightheadedness with hypotension: No Has patient had a PCN reaction causing severe rash involving mucus membranes or skin necrosis: No Has patient had a PCN reaction that required hospitalization: No Has patient had a PCN reaction occurring within the last 10 years: No If all of the above answers are "NO", then may proceed with Cephalosporin use.      Medication List        Accurate as of  05/26/18  8:32 PM. Always use your most recent med list.          atomoxetine 40 MG capsule Commonly known as:  STRATTERA Take 1 capsule (40 mg total) by mouth 2 (two) times daily with a meal.   Biotin 5000 MCG Caps Take 5,000 mcg by mouth daily.   butalbital-acetaminophen-caffeine 50-325-40 MG tablet Commonly known as:  FIORICET, ESGIC Take 1-2 tablets by mouth every 6 (six) hours as needed for headache.   escitalopram 20 MG tablet Commonly known as:  LEXAPRO Take 40 mg by mouth daily.   gabapentin  300 MG capsule Commonly known as:  NEURONTIN Take 300 mg by mouth at bedtime as needed. For nerve pain   levothyroxine 100 MCG tablet Commonly known as:  SYNTHROID, LEVOTHROID Take 100 mcg by mouth daily before breakfast.   pramipexole 0.5 MG tablet Commonly known as:  MIRAPEX Take 0.5 mg by mouth at bedtime. May take an additional 0.5 mg for restless legs   TRULICITY 1.61 WR/6.0AV Sopn Generic drug:  Dulaglutide Inject 0.5 mLs into the skin every Thursday.   VITAMIN B COMPLEX PO Take 1 tablet by mouth daily.       Allergies:  Allergies  Allergen Reactions  . Sulfasalazine Hives  . 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 Sulfa Antibiotics Hives  . Penicillins Hives and Rash    Has patient had a PCN reaction causing immediate rash, facial/tongue/throat swelling, SOB or lightheadedness with hypotension: No Has patient had a PCN reaction causing severe rash involving mucus membranes or skin necrosis: No Has patient had a PCN reaction that required hospitalization: No Has patient had a PCN reaction occurring within the last 10 years: No If all of the above answers are "NO", then may proceed with Cephalosporin use.     Family History: Family History  Problem Relation Age of Onset  . Cancer Mother        breast  . Heart disease Father   . Diabetes Father   . Hypertension Father   . Cancer Maternal Grandmother   . Cancer Maternal Grandfather   . Heart disease Paternal Grandmother   . Heart disease Paternal Grandfather   . Cancer Maternal Aunt   . Cancer Maternal Uncle   . Heart disease Paternal Aunt   . Heart disease Paternal Uncle   . Kidney cancer Neg Hx   . Bladder Cancer Neg Hx     Social History:  reports that she quit smoking about 3 years ago. Her smoking use included cigarettes. She smoked 0.50 packs per day. She has never used smokeless tobacco. She reports that she does not drink alcohol or use drugs.  ROS: UROLOGY Frequent Urination?: No Hard  to postpone urination?: No Burning/pain with urination?: No Get up at night to urinate?: No Leakage of urine?: No Urine stream starts and stops?: No Trouble starting stream?: No Do you have to strain to urinate?: No Blood in urine?: No Urinary tract infection?: No Sexually transmitted disease?: No Injury to kidneys or bladder?: No Painful intercourse?: No Weak stream?: No Currently pregnant?: No Vaginal bleeding?: No Last menstrual period?: n  Gastrointestinal Nausea?: No Vomiting?: No Indigestion/heartburn?: No Diarrhea?: No Constipation?: No  Constitutional Fever: No Night sweats?: No Weight loss?: No Fatigue?: No  Skin Skin rash/lesions?: No Itching?: No  Eyes Blurred vision?: No Double vision?: No  Ears/Nose/Throat Sore throat?: No Sinus problems?: No  Hematologic/Lymphatic Swollen glands?: No Easy bruising?: No  Cardiovascular Leg swelling?:  No Chest pain?: No  Respiratory Cough?: No Shortness of breath?: No  Endocrine Excessive thirst?: No  Musculoskeletal Back pain?: No Joint pain?: No  Neurological Headaches?: No Dizziness?: No  Psychologic Depression?: No Anxiety?: No  Physical Exam: BP (!) 160/85   Pulse 80   Ht 5\' 4"  (1.626 m)   Wt 180 lb (81.6 kg)   BMI 30.90 kg/m   Constitutional:  Alert and oriented, No acute distress.  Accompanied by grandson today. HEENT: [place] AT, moist mucus membranes.  Trachea midline, no masses. Cardiovascular: No clubbing, cyanosis, or edema. Respiratory: Normal respiratory effort, no increased work of breathing. Skin: No rashes, bruises or suspicious lesions. Neurologic: Grossly intact, no focal deficits, moving all 4 extremities. Psychiatric: Normal mood and affect.  Laboratory Data: Lab Results  Component Value Date   WBC 11.4 (H) 11/01/2017   HGB 13.7 11/01/2017   HCT 39.8 11/01/2017   MCV 92.5 11/01/2017   PLT 262 11/01/2017    Lab Results  Component Value Date   CREATININE 0.88  11/01/2017    Urinalysis    Component Value Date/Time   COLORURINE YELLOW (A) 10/04/2017 1405   APPEARANCEUR HAZY (A) 10/04/2017 1405   APPEARANCEUR Cloudy (A) 09/15/2017 1119   LABSPEC 1.024 10/04/2017 1405   PHURINE 5.0 10/04/2017 1405   GLUCOSEU NEGATIVE 10/04/2017 1405   HGBUR SMALL (A) 10/04/2017 1405   BILIRUBINUR NEGATIVE 10/04/2017 1405   BILIRUBINUR Negative 09/15/2017 [place] 10/04/2017 1405   PROTEINUR NEGATIVE 10/04/2017 1405   NITRITE NEGATIVE 10/04/2017 1405   LEUKOCYTESUR NEGATIVE 10/04/2017 1405   LEUKOCYTESUR 3+ (A) 09/15/2017 1119    Lab Results  Component Value Date   LABMICR See below: 09/15/2017   WBCUA >30 (H) 09/15/2017   RBCUA >30 (H) 09/15/2017   LABEPIT 0-10 09/15/2017   MUCUS Present (A) 09/15/2017   BACTERIA RARE (A) 10/04/2017    Pertinent Imaging: Results for orders placed during the hospital encounter of 04/27/18  Abdomen 1 view (KUB)   Narrative CLINICAL DATA:  Follow-up urinary calculi.  EXAM: ABDOMEN - 1 VIEW  COMPARISON:  08/31/2017 CT and prior studies  FINDINGS: Punctate and 4-5 mm calcifications overlying the LEFT abdomen could represent LEFT LOWER pole renal calculi.  No other calcifications are identified overlying the renal shadows or expected courses of the ureters.  A moderate amount of stool is noted.  Gastric bypass changes are present.  IMPRESSION: Calcifications overlying the LEFT abdomen could represent LEFT LOWER pole renal calculi. No other evidence of urinary calculi noted.   Electronically Signed   By: Margarette Canada M.D.   On: 04/27/2018 10:52    KUB imaging was personally reviewed today with the patient.  This is compared to previous KUB as well as previous CT scans times multiple.  It appears that the stones have possibly progressed in size and number.  Assessment & Plan:    1. Left nephrolithiasis Interval increase in the left lower pole stones in size and number Otherwise  asymptomatic She is following a strict stone diet protocol At this point, from her stone burden, would recommend KUB in 6 months to assess for interval progression of stone burden In terms of stone prevention, we discussed the role of 24 hour urine metabolic work-up which she is interested in proceeding with We reviewed how to pursue this and was ordered today.  She will follow-up in 2 months to review this in detail   Return in about 2 months (around 07/26/2018) for review 24  hour urine AND 6 months with KUB.  Hollice Espy, MD  [facility] [address] [place], [place] [address] [phone]

## 2018-06-01 ENCOUNTER — Ambulatory Visit (INDEPENDENT_AMBULATORY_CARE_PROVIDER_SITE_OTHER): Payer: BC Managed Care – PPO

## 2018-06-01 ENCOUNTER — Encounter: Payer: Self-pay | Admitting: Podiatry

## 2018-06-01 ENCOUNTER — Ambulatory Visit: Payer: BC Managed Care – PPO | Admitting: Podiatry

## 2018-06-01 DIAGNOSIS — S92215G Nondisplaced fracture of cuboid bone of left foot, subsequent encounter for fracture with delayed healing: Secondary | ICD-10-CM

## 2018-06-01 NOTE — Progress Notes (Signed)
She presents today for follow-up of fracture to the cuboid left foot.  States that is doing much better much improved.  Objective: Vital signs stable she is alert and oriented x3 has some tenderness on direct palpation of the cuboid but not as tender nor swollen as it was previously.  Assessment: Resolving fracture cuboid bone.  Plan: Follow-up with me on as-needed basis

## 2018-06-09 ENCOUNTER — Other Ambulatory Visit: Payer: BC Managed Care – PPO

## 2018-06-10 ENCOUNTER — Other Ambulatory Visit: Payer: Self-pay

## 2018-06-10 MED ORDER — ATOMOXETINE HCL 40 MG PO CAPS: 40.0000 mg | ORAL_CAPSULE | Freq: Two times a day (BID) | ORAL | 0 refills | Status: DC

## 2018-06-10 NOTE — Telephone Encounter (Signed)
Patient only seen in this office for strattera refill. Last seen 06/2017 and has appointment 06/2018.

## 2018-06-15 ENCOUNTER — Other Ambulatory Visit: Payer: BC Managed Care – PPO

## 2018-06-19 ENCOUNTER — Other Ambulatory Visit: Payer: Self-pay

## 2018-06-19 ENCOUNTER — Emergency Department
Admission: EM | Admit: 2018-06-19 | Discharge: 2018-06-19 | Disposition: A | Discharge status: Home / Self Care | Payer: BC Managed Care – PPO | Attending: Student in an Organized Health Care Education/Training Program | Admitting: Student in an Organized Health Care Education/Training Program

## 2018-06-19 ENCOUNTER — Emergency Department: Payer: BC Managed Care – PPO

## 2018-06-19 DIAGNOSIS — Z79899 Other long term (current) drug therapy: Secondary | ICD-10-CM | POA: Diagnosis not present

## 2018-06-19 DIAGNOSIS — I10 Essential (primary) hypertension: Secondary | ICD-10-CM | POA: Insufficient documentation

## 2018-06-19 DIAGNOSIS — Z87891 Personal history of nicotine dependence: Secondary | ICD-10-CM | POA: Insufficient documentation

## 2018-06-19 DIAGNOSIS — E119 Type 2 diabetes mellitus without complications: Secondary | ICD-10-CM | POA: Insufficient documentation

## 2018-06-19 DIAGNOSIS — Z7984 Long term (current) use of oral hypoglycemic drugs: Secondary | ICD-10-CM | POA: Insufficient documentation

## 2018-06-19 DIAGNOSIS — M79672 Pain in left foot: Secondary | ICD-10-CM | POA: Insufficient documentation

## 2018-06-19 DIAGNOSIS — E039 Hypothyroidism, unspecified: Secondary | ICD-10-CM | POA: Diagnosis not present

## 2018-06-19 MED ORDER — HYDROCODONE-ACETAMINOPHEN 5-325 MG PO TABS: 1.0000 | ORAL_TABLET | Freq: Four times a day (QID) | ORAL | 0 refills | Status: DC | PRN

## 2018-06-19 MED ORDER — TRAMADOL HCL 50 MG PO TABS: 50.0000 mg | ORAL_TABLET | Freq: Four times a day (QID) | ORAL | 0 refills | Status: DC | PRN

## 2018-06-19 NOTE — Discharge Instructions (Addendum)
Follow-up with your regular doctor and keep your appointment with the orthopedic doctor.  Keep the foot elevated and ice to help with the swelling and pain.  Been given pain medication to use if Tylenol and ibuprofen do not help.

## 2018-06-19 NOTE — ED Notes (Addendum)
Pt reports left foot pain for 3 days, states she has neuromas in her left foot.  Pt has swelling in her left foot, but states she has not had swelling this bad.  Pt states she has appt with podiatry tomorrow, but states the pain is too bad.  Pt taking ibuprofen.  Pt states she has a fracture on the lateral side of her foot that is healing, but states she does not have to wear a brace or boot anymore.  Pt wore flips flops to ED.  Pt sees Dr. Milinda Pointer from podiatry

## 2018-06-19 NOTE — ED Provider Notes (Signed)
[facility] Emergency Department Provider Note  ____________________________________________   First MD Initiated Contact with Patient 06/19/18 1218     (approximate)  I have reviewed the triage vital signs and the nursing notes.   HISTORY  Chief Complaint Foot Pain    HPI Courtney Murphy is a 62 y.o. female presents emergency department complaining of left foot pain for 3 days.  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  She has an appointment with podiatry tomorrow but the pain and swelling have become more severe.  She states she did walk around [place] park for the last 3 days on the concrete.  She states she thinks this may the area worse.  She was told by the podiatrist to be very easy to re-break this bone.  She denies any other issues at this time.  She denies history of osteopenia or porosis  Past Medical History:  Diagnosis Date  . Acid reflux 10/11/2015  . Anemia   . Arthritis   . Depression   . Diabetes mellitus without complication ([place])   . Fatty liver   . Gout   . History of kidney stones   . Hypertension   . Hypothyroidism   . Kidney stone   . Knee pain    Left  . Pulmonary emboli ([place])   . Restless leg syndrome   . Shoulder pain     Patient Active Problem List   Diagnosis Date Noted  . Kidney stone 08/17/2017  . Type 2 diabetes mellitus without complication, without long-term current use of insulin ([place]) 04/22/2017  . Chest pain 09/30/2016  . Acid reflux 10/11/2015  . Adult hypothyroidism 10/11/2015  . ADHD (attention deficit hyperactivity disorder) 10/11/2015  . Bariatric surgery status 10/22/2014  . HLD (hyperlipidemia) 08/18/2013    Past Surgical History:  Procedure Laterality Date  . ABDOMINAL HYSTERECTOMY    . BACK SURGERY    . BREAST REDUCTION SURGERY    . CATARACT EXTRACTION W/PHACO Left 07/28/2017   Procedure: CATARACT EXTRACTION PHACO AND INTRAOCULAR  LENS PLACEMENT ([place]) diabetic Left;  Surgeon: Leandrew Koyanagi, MD;  Location: [place];  Service: Ophthalmology;  Laterality: Left;  Diabetic  . CESAREAN SECTION  1990  . CHOLECYSTECTOMY    . CYSTOSCOPY/URETEROSCOPY/HOLMIUM LASER/STENT PLACEMENT Left 09/08/2017   Procedure: CYSTOSCOPY/URETEROSCOPY/HOLMIUM LASER/STENT EXCHANGE;  Surgeon: Hollice Espy, MD;  Location: [facility];  Service: Urology;  Laterality: Left;  . FOOT SURGERY    . GASTRIC BYPASS    . IR NEPHROSTOMY PLACEMENT LEFT  08/17/2017  . KNEE ARTHROSCOPY Left   . KNEE ARTHROSCOPY Left 07/19/2015   Procedure: ARTHROSCOPY KNEE;  Surgeon: Leanor Kail, MD;  Location: [facility];  Service: Orthopedics;  Laterality: Left;  . NEPHROLITHOTOMY Left 08/17/2017   Procedure: NEPHROLITHOTOMY PERCUTANEOUS WITH HOLMIUM LASER;  Surgeon: Hollice Espy, MD;  Location: [facility];  Service: Urology;  Laterality: Left;  . THUMB ARTHROSCOPY    . TONSILLECTOMY      Prior to Admission medications   Medication Sig Start Date End Date Taking? Authorizing Provider  atomoxetine (STRATTERA) 40 MG capsule Take 1 capsule (40 mg total) by mouth 2 (two) times daily with a meal. 06/10/18   Kathrine Haddock, NP  B Complex Vitamins (VITAMIN B COMPLEX PO) Take 1 tablet by mouth daily.    [provider]  Biotin 5000 MCG CAPS Take 5,000 mcg by mouth daily.    [provider]  butalbital-acetaminophen-caffeine (FIORICET, ESGIC) 50-325-40 MG tablet Take 1-2 tablets by mouth every 6 (six) hours as needed for headache. 11/01/17 11/01/18  Lavonia Drafts, MD  Dulaglutide (TRULICITY) 7.32 KG/2.5KY SOPN Inject 0.5 mLs into the skin every Thursday.    [provider]  escitalopram (LEXAPRO) 20 MG tablet Take 40 mg by mouth daily.     [provider]  gabapentin (NEURONTIN) 300 MG capsule Take 300 mg by mouth at bedtime as needed. For nerve pain    [provider]  HYDROcodone-acetaminophen (NORCO/VICODIN) 5-325 MG  tablet Take 1 tablet by mouth every 6 (six) hours as needed for moderate pain. 06/19/18   Lyvia Mondesir, Linden Dolin, PA-C  levothyroxine (SYNTHROID, LEVOTHROID) 100 MCG tablet Take 100 mcg by mouth daily before breakfast.    [provider]  pramipexole (MIRAPEX) 0.5 MG tablet Take 0.5 mg by mouth at bedtime. May take an additional 0.5 mg for restless legs    [provider]    Allergies Sulfasalazine; Nsaids; Sulfa antibiotics; and Penicillins  Family History  Problem Relation Age of Onset  . Cancer Mother        breast  . Heart disease Father   . Diabetes Father   . Hypertension Father   . Cancer Maternal Grandmother   . Cancer Maternal Grandfather   . Heart disease Paternal Grandmother   . Heart disease Paternal Grandfather   . Cancer Maternal Aunt   . Cancer Maternal Uncle   . Heart disease Paternal Aunt   . Heart disease Paternal Uncle   . Kidney cancer Neg Hx   . Bladder Cancer Neg Hx     Social History Social History   Tobacco Use  . Smoking status: Former Smoker    Packs/day: 0.50    Types: Cigarettes    Last attempt to quit: 04/14/2015    Years since quitting: 3.1  . Smokeless tobacco: Never Used  Substance Use Topics  . Alcohol use: No  . Drug use: No    Review of Systems  Constitutional: No fever/chills Eyes: No visual changes. ENT: No sore throat. Respiratory: Denies cough Genitourinary: Negative for dysuria. Musculoskeletal: Negative for back pain.  Positive for left foot pain Skin: Negative for rash.    ____________________________________________   PHYSICAL EXAM:  VITAL SIGNS: ED Triage Vitals  Enc Vitals Group     BP 06/19/18 1210 (!) 157/79     Pulse Rate 06/19/18 1210 92     Resp 06/19/18 1210 16     Temp 06/19/18 1210 98.4 F (36.9 C)     Temp Source 06/19/18 1210 Oral     SpO2 06/19/18 1210 100 %     Weight 06/19/18 1211 180 lb (81.6 kg)     Height 06/19/18 1211 5\' 4"  (1.626 m)     Head Circumference --      Peak Flow  --      Pain Score 06/19/18 1211 6     Pain Loc --      Pain Edu? --      Excl. in [place]? --     Constitutional: Alert and oriented. Well appearing and in no acute distress. Eyes: Conjunctivae are normal.  Head: Atraumatic. Nose: No congestion/rhinnorhea. Mouth/Throat: Mucous membranes are moist.   Cardiovascular: Normal rate, regular rhythm. Respiratory: Normal respiratory effort.  No retractions GU: deferred Musculoskeletal: FROM all extremities, warm and well perfused.  The left foot is grossly swollen and tender along the midfoot and the fifth and fourth metatarsals.  There is  no sign of infection or drainage.  Neurovascular is intact Neurologic:  Normal speech and language.  Skin:  Skin is warm, dry and intact. No rash noted. Psychiatric: Mood and affect are normal. Speech and behavior are normal.  ____________________________________________   LABS (all labs ordered are listed, but only abnormal results are displayed)  Labs Reviewed - No data to display ____________________________________________   ____________________________________________  RADIOLOGY  X-ray of the left foot is negative for any acute abnormality  ____________________________________________   PROCEDURES  Procedure(s) performed: No  Procedures    ____________________________________________   INITIAL IMPRESSION / ASSESSMENT AND PLAN / ED COURSE  Pertinent labs & imaging results that were available during my care of the patient were reviewed by me and considered in my medical decision making (see chart for details).  Patient is 62 year old female presents emergency department complaining of left foot pain.  She has a history of a cuboid fracture approximately 6 weeks ago.  She is been walking on concrete at a water park for the last 3 days and has had increased swelling and pain in the foot.  She is concerned that she may have another fracture.  Physical exam the left foot is grossly  swollen and tender.  X-ray of the left foot is negative  Discussed the x-ray results with patient.  Due to the mild swelling she is to keep the foot elevated and ice.  She has a boot at home that she needs to wear.  She was given a prescription for Vicodin 5/325 #10 with no refill.  She is to see her podiatrist tomorrow.  Return to emergency department if worsening.  She was discharged in stable condition     As part of my medical decision making, I reviewed the following data within the [facility] notes reviewed and incorporated, Old chart reviewed, Radiograph reviewed x-ray of the left foot is negative, Notes from prior ED visits and [place] Controlled Substance Database  ____________________________________________   FINAL CLINICAL IMPRESSION(S) / ED DIAGNOSES  Final diagnoses:  Foot pain, left      NEW MEDICATIONS STARTED DURING THIS VISIT:  Discharge Medication List as of 06/19/2018  1:25 PM    START taking these medications   Details  traMADol (ULTRAM) 50 MG tablet Take 1 tablet (50 mg total) by mouth every 6 (six) hours as needed., Starting Sun 06/19/2018, Print         Note:  This document was prepared using Dragon voice recognition software and may include unintentional dictation errors.    Versie Starks, PA-C 06/19/18 1425    Merlyn Lot, MD 06/19/18 [phone]

## 2018-06-19 NOTE — ED Triage Notes (Signed)
Left foot pain x 3 days. Pt does report recent fracture to same foot X 6weeks ago. Swelling to left foot at this time. Has appt with podiatrist tomorrow but pain is severe.

## 2018-06-20 ENCOUNTER — Encounter: Payer: Self-pay | Admitting: Podiatry

## 2018-06-20 ENCOUNTER — Ambulatory Visit: Payer: BC Managed Care – PPO | Admitting: Podiatry

## 2018-06-20 DIAGNOSIS — M778 Other enthesopathies, not elsewhere classified: Secondary | ICD-10-CM

## 2018-06-20 DIAGNOSIS — M779 Enthesopathy, unspecified: Secondary | ICD-10-CM

## 2018-06-20 DIAGNOSIS — M109 Gout, unspecified: Secondary | ICD-10-CM | POA: Diagnosis not present

## 2018-06-20 MED ORDER — METHYLPREDNISOLONE 4 MG PO TBPK: ORAL_TABLET | ORAL | 0 refills | Status: DC

## 2018-06-20 NOTE — Progress Notes (Signed)
She presents today for follow-up of her painful foot left.  States that she was doing quite well until just the other day her foot started to swell a lot but she had to go to the emergency room because he was so red hot and swollen.  She states that it scared her so she went to the ER and had it x-rayed and they gave her pain medicine.  Objective: Vital signs are stable she is alert and oriented x3.  Left foot still demonstrates considerable swelling dorsal aspect of the foot with pain on palpation of the lesser metatarsals and pain on palpation of the first metatarsophalangeal joint.  There did not appear to be any type of insect bites.  No signs of infection no portals of entry.  I reviewed radiographs taken from the hospital which do not demonstrate any type of fractures.  Assessment: Cannot rule out gouty arthritis or some type of seropositive arthropathy.  Cannot rule out stress fracture.  Plan: Recommended that she return to her cam walker and also recommended blood work consisting of a CBC and an arthritic profile.  I wrote a prescription for Medrol Dosepak.  Follow-up with her in the near future.

## 2018-06-21 ENCOUNTER — Other Ambulatory Visit: Payer: Self-pay | Admitting: Urology

## 2018-06-22 ENCOUNTER — Telehealth: Payer: Self-pay | Admitting: *Deleted

## 2018-06-22 LAB — CBC WITH DIFFERENTIAL/PLATELET
BASOS: 0 %
Basophils Absolute: 0 10*3/uL (ref 0.0–0.2)
EOS (ABSOLUTE): 0.2 10*3/uL (ref 0.0–0.4)
EOS: 2 %
Hematocrit: 39.3 % (ref 34.0–46.6)
Hemoglobin: 13.9 g/dL (ref 11.1–15.9)
IMMATURE GRANS (ABS): 0 10*3/uL (ref 0.0–0.1)
IMMATURE GRANULOCYTES: 0 %
LYMPHS: 26 %
Lymphocytes Absolute: 2.4 10*3/uL (ref 0.7–3.1)
MCH: 31.6 pg (ref 26.6–33.0)
MCHC: 35.4 g/dL (ref 31.5–35.7)
MCV: 89 fL (ref 79–97)
MONOS ABS: 0.4 10*3/uL (ref 0.1–0.9)
Monocytes: 4 %
NEUTROS ABS: 6.2 10*3/uL (ref 1.4–7.0)
Neutrophils: 68 %
PLATELETS: 294 10*3/uL (ref 150–450)
RBC: 4.4 x10E6/uL (ref 3.77–5.28)
RDW: 12.7 % (ref 12.3–15.4)
WBC: 9.2 10*3/uL (ref 3.4–10.8)

## 2018-06-22 LAB — RHEUMATOID FACTOR

## 2018-06-22 LAB — C-REACTIVE PROTEIN: CRP: 2 mg/L (ref 0–10)

## 2018-06-22 LAB — ANA: ANA: NEGATIVE

## 2018-06-22 LAB — SEDIMENTATION RATE: SED RATE: 18 mm/h (ref 0–40)

## 2018-06-22 LAB — URIC ACID: Uric Acid: 5.1 mg/dL (ref 2.5–7.1)

## 2018-06-22 MED ORDER — HYDROCODONE-ACETAMINOPHEN 5-325 MG PO TABS: 1.0000 | ORAL_TABLET | Freq: Four times a day (QID) | ORAL | 0 refills | Status: AC | PRN

## 2018-06-22 NOTE — Telephone Encounter (Signed)
vicodin for 5 days.

## 2018-06-22 NOTE — Telephone Encounter (Signed)
-----   Message from Garrel Ridgel, [place] sent at 06/22/2018 12:16 PM EDT ----- Blood work looks good and should keep her follow up appointment.

## 2018-06-22 NOTE — Telephone Encounter (Signed)
Per Dr. Milinda Pointer, script has been printed and left at front desk for pick up.   Patient has been notified of script

## 2018-06-22 NOTE — Telephone Encounter (Signed)
I informed pt of Dr. Stephenie Acres review of results and orders, pt states her foot is swollen and painful and hated to wait 2 weeks to see him and her pain medication from the ED is about to run out. I told pt I would send a message to Dr. Milinda Pointer and call again.

## 2018-06-28 ENCOUNTER — Encounter: Payer: Self-pay | Admitting: Internal Medicine

## 2018-07-06 ENCOUNTER — Ambulatory Visit: Payer: BC Managed Care – PPO | Admitting: Podiatry

## 2018-07-06 ENCOUNTER — Encounter: Payer: Self-pay | Admitting: Podiatry

## 2018-07-06 ENCOUNTER — Ambulatory Visit (INDEPENDENT_AMBULATORY_CARE_PROVIDER_SITE_OTHER): Payer: BC Managed Care – PPO

## 2018-07-06 DIAGNOSIS — S92215G Nondisplaced fracture of cuboid bone of left foot, subsequent encounter for fracture with delayed healing: Secondary | ICD-10-CM | POA: Diagnosis not present

## 2018-07-06 NOTE — Progress Notes (Signed)
She presents today for follow-up of her left foot pain.  States that is doing so much better than it was that she refers to her pain and swelling.  He states that is gone down considerably but still has some soreness right here she points to the third metatarsal left foot dorsally.  Objective: Vital signs stable alert oriented x3 wearing regular shoes today.  Pulses are palpable still some edema and pain on palpation to the dorsal mid diaphyseal region of the third metatarsal.  She has no pain on range of motion of the tendons themselves.  Radiographs taken today demonstrate no cortical interruption however I do wonder if she has any endosteal stress reaction and swelling within the bone.   Assessment: At this point I feel this endosteal stress reaction left foot.  Plan: I want her to continue current therapies and I will follow-up with her in a month if necessary.

## 2018-07-07 ENCOUNTER — Telehealth: Payer: Self-pay | Admitting: Unknown Physician Specialty

## 2018-07-07 NOTE — Telephone Encounter (Signed)
Copied from [place] [phone]. Topic: Quick Communication - See Telephone Encounter >> Jul 07, 2018  8:59 AM Synthia Innocent wrote: CRM for notification. See Telephone encounter for: 07/07/18. Pill pack calling stating patient has reported a dose change on atomoxetine (STRATTERA) 40 MG capsule, states she only take 1 tab daily. Please advise if this is correct will need new rx reflecting that change.

## 2018-07-07 NOTE — Telephone Encounter (Signed)
Message relayed to patient. Verbalized understanding and denied questions. Pt states she has cleared it up w/ Pill pack

## 2018-07-07 NOTE — Telephone Encounter (Signed)
Has not been seen in > 1 year. It also looks like this patient has established at [facility] and so I will not refill this medication.

## 2018-07-21 ENCOUNTER — Encounter: Payer: Self-pay | Admitting: Urology

## 2018-07-21 ENCOUNTER — Ambulatory Visit: Payer: BC Managed Care – PPO | Admitting: Urology

## 2018-07-21 VITALS — BP 164/83 | HR 109 | Ht 64.0 in | Wt 182.6 lb

## 2018-07-21 DIAGNOSIS — N209 Urinary calculus, unspecified: Secondary | ICD-10-CM

## 2018-07-21 DIAGNOSIS — Z9884 Bariatric surgery status: Secondary | ICD-10-CM

## 2018-07-21 NOTE — Progress Notes (Deleted)
07/21/2018 10:47 AM   Courtney Murphy 10/09/1956 [id number]  Referring provider: Audelia Acton, MD No address on file  No chief complaint on file.   HPI:    PMH: Past Medical History:  Diagnosis Date  . Acid reflux 10/11/2015  . Anemia   . Arthritis   . Depression   . Diabetes mellitus without complication ([place])   . Fatty liver   . Gout   . History of kidney stones   . Hypertension   . Hypothyroidism   . Kidney stone   . Knee pain    Left  . Pulmonary emboli ([place])   . Restless leg syndrome   . Shoulder pain     Surgical History: Past Surgical History:  Procedure Laterality Date  . ABDOMINAL HYSTERECTOMY    . BACK SURGERY    . BREAST REDUCTION SURGERY    . CATARACT EXTRACTION W/PHACO Left 07/28/2017   Procedure: CATARACT EXTRACTION PHACO AND INTRAOCULAR LENS PLACEMENT ([place]) diabetic Left;  Surgeon: Leandrew Koyanagi, MD;  Location: [place];  Service: Ophthalmology;  Laterality: Left;  Diabetic  . CESAREAN SECTION  1990  . CHOLECYSTECTOMY    . CYSTOSCOPY/URETEROSCOPY/HOLMIUM LASER/STENT PLACEMENT Left 09/08/2017   Procedure: CYSTOSCOPY/URETEROSCOPY/HOLMIUM LASER/STENT EXCHANGE;  Surgeon: Hollice Espy, MD;  Location: [facility];  Service: Urology;  Laterality: Left;  . FOOT SURGERY    . GASTRIC BYPASS    . IR NEPHROSTOMY PLACEMENT LEFT  08/17/2017  . KNEE ARTHROSCOPY Left   . KNEE ARTHROSCOPY Left 07/19/2015   Procedure: ARTHROSCOPY KNEE;  Surgeon: Leanor Kail, MD;  Location: [facility];  Service: Orthopedics;  Laterality: Left;  . NEPHROLITHOTOMY Left 08/17/2017   Procedure: NEPHROLITHOTOMY PERCUTANEOUS WITH HOLMIUM LASER;  Surgeon: Hollice Espy, MD;  Location: [facility];  Service: Urology;  Laterality: Left;  . THUMB ARTHROSCOPY    . TONSILLECTOMY      Home Medications:  Allergies as of 07/21/2018      Reactions   Sulfasalazine Hives   Nsaids    Avoids because of gastric bypass   Sulfa Antibiotics Hives   Penicillins Hives,  Rash   Has patient had a PCN reaction causing immediate rash, facial/tongue/throat swelling, SOB or lightheadedness with hypotension: No Has patient had a PCN reaction causing severe rash involving mucus membranes or skin necrosis: No Has patient had a PCN reaction that required hospitalization: No Has patient had a PCN reaction occurring within the last 10 years: No If all of the above answers are "NO", then may proceed with Cephalosporin use.      Medication List        Accurate as of 07/21/18 10:47 AM. Always use your most recent med list.          atomoxetine 40 MG capsule Commonly known as:  STRATTERA Take 1 capsule (40 mg total) by mouth 2 (two) times daily with a meal.   atorvastatin 20 MG tablet Commonly known as:  LIPITOR   Biotin 5000 MCG Caps Take 5,000 mcg by mouth daily.   butalbital-acetaminophen-caffeine 50-325-40 MG tablet Commonly known as:  FIORICET, ESGIC Take 1-2 tablets by mouth every 6 (six) hours as needed for headache.   cyanocobalamin 1000 MCG/ML injection Commonly known as:  (VITAMIN B-12)   escitalopram 20 MG tablet Commonly known as:  LEXAPRO Take 40 mg by mouth daily.   gabapentin 300 MG capsule Commonly known as:  NEURONTIN Take 300 mg by mouth at bedtime as needed. For nerve pain   levothyroxine 100 MCG tablet Commonly known  as:  SYNTHROID, LEVOTHROID Take 100 mcg by mouth daily before breakfast.   methylPREDNISolone 4 MG Tbpk tablet Commonly known as:  MEDROL DOSEPAK 6 day dose pack - take as directed   pramipexole 0.5 MG tablet Commonly known as:  MIRAPEX Take 0.5 mg by mouth at bedtime. May take an additional 0.5 mg for restless legs   PROVENTIL HFA 108 (90 Base) MCG/ACT inhaler Generic drug:  albuterol 2 puffs q.i.d. p.r.n. short of breath, wheezing, or cough   TRULICITY 7.93 JQ/3.0SP Sopn Generic drug:  Dulaglutide Inject 0.5 mLs into the skin every Thursday.   VITAMIN B COMPLEX PO Take 1 tablet by mouth daily.        Allergies:  Allergies  Allergen Reactions  . Sulfasalazine Hives  . 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 Sulfa Antibiotics Hives  . Penicillins Hives and Rash    Has patient had a PCN reaction causing immediate rash, facial/tongue/throat swelling, SOB or lightheadedness with hypotension: No Has patient had a PCN reaction causing severe rash involving mucus membranes or skin necrosis: No Has patient had a PCN reaction that required hospitalization: No Has patient had a PCN reaction occurring within the last 10 years: No If all of the above answers are "NO", then may proceed with Cephalosporin use.     Family History: Family History  Problem Relation Age of Onset  . Cancer Mother        breast  . Heart disease Father   . Diabetes Father   . Hypertension Father   . Cancer Maternal Grandmother   . Cancer Maternal Grandfather   . Heart disease Paternal Grandmother   . Heart disease Paternal Grandfather   . Cancer Maternal Aunt   . Cancer Maternal Uncle   . Heart disease Paternal Aunt   . Heart disease Paternal Uncle   . Kidney cancer Neg Hx   . Bladder Cancer Neg Hx     Social History:  reports that she quit smoking about 3 years ago. Her smoking use included cigarettes. She smoked 0.50 packs per day. She has never used smokeless tobacco. She reports that she does not drink alcohol or use drugs.  ROS:                                        Physical Exam: There were no vitals taken for this visit.  Constitutional:  Alert and oriented, No acute distress. HEENT: [place] AT, moist mucus membranes.  Trachea midline, no masses. Cardiovascular: No clubbing, cyanosis, or edema. Respiratory: Normal respiratory effort, no increased work of breathing. GI: Abdomen is soft, nontender, nondistended, no abdominal masses GU: No CVA tenderness Lymph: No cervical or inguinal lymphadenopathy. Skin: No rashes, bruises or suspicious lesions. Neurologic:  Grossly intact, no focal deficits, moving all 4 extremities. Psychiatric: Normal mood and affect.  Laboratory Data: Lab Results  Component Value Date   WBC 9.2 06/21/2018   HGB 13.9 06/21/2018   HCT 39.3 06/21/2018   MCV 89 06/21/2018   PLT 294 06/21/2018    Lab Results  Component Value Date   CREATININE 0.88 11/01/2017    No results found for: PSA  No results found for: TESTOSTERONE  No results found for: HGBA1C  Urinalysis    Component Value Date/Time   COLORURINE YELLOW (A) 10/04/2017 1405   APPEARANCEUR HAZY (A) 10/04/2017 1405   APPEARANCEUR Cloudy (A)  09/15/2017 1119   LABSPEC 1.024 10/04/2017 1405   PHURINE 5.0 10/04/2017 1405   GLUCOSEU NEGATIVE 10/04/2017 1405   HGBUR SMALL (A) 10/04/2017 [place] 10/04/2017 1405   BILIRUBINUR Negative 09/15/2017 [place] 10/04/2017 1405   PROTEINUR NEGATIVE 10/04/2017 1405   NITRITE NEGATIVE 10/04/2017 1405   LEUKOCYTESUR NEGATIVE 10/04/2017 1405   LEUKOCYTESUR 3+ (A) 09/15/2017 1119    Lab Results  Component Value Date   LABMICR See below: 09/15/2017   WBCUA >30 (H) 09/15/2017   RBCUA >30 (H) 09/15/2017   LABEPIT 0-10 09/15/2017   MUCUS Present (A) 09/15/2017   BACTERIA RARE (A) 10/04/2017    Pertinent Imaging: *** Results for orders placed during the hospital encounter of 04/27/18  Abdomen 1 view (KUB)   Narrative CLINICAL DATA:  Follow-up urinary calculi.  EXAM: ABDOMEN - 1 VIEW  COMPARISON:  08/31/2017 CT and prior studies  FINDINGS: Punctate and 4-5 mm calcifications overlying the LEFT abdomen could represent LEFT LOWER pole renal calculi.  No other calcifications are identified overlying the renal shadows or expected courses of the ureters.  A moderate amount of stool is noted.  Gastric bypass changes are present.  IMPRESSION: Calcifications overlying the LEFT abdomen could represent LEFT LOWER pole renal calculi. No other evidence of urinary calculi  noted.   Electronically Signed   By: Margarette Canada M.D.   On: 04/27/2018 10:52    No results found for this or any previous visit. No results found for this or any previous visit. No results found for this or any previous visit. Results for orders placed during the hospital encounter of 09/21/17  US RENAL   Narrative CLINICAL DATA:  62 year old female with left kidney stone surgery with placement of stent which was removed 09/15/2017. Subsequent encounter.  EXAM: RENAL / URINARY TRACT ULTRASOUND COMPLETE  COMPARISON:  08/31/2017 CT.  FINDINGS: Right Kidney:  Length: 10.5 cm. Echogenicity within normal limits. No mass or hydronephrosis visualized.  Left Kidney:  Length: 10.9 cm. Echogenicity within normal limits. No hydronephrosis visualized. Lower pole 5.7 mm nonobstructing stone.  Bladder:  Appears normal for degree of bladder distention.  IMPRESSION: Left lower pole 5.7 mm nonobstructing stone.  No hydronephrosis.   Electronically Signed   By: Genia Del M.D.   On: 09/21/2017 15:55    No results found for this or any previous visit. No results found for this or any previous visit. Results for orders placed during the hospital encounter of 08/31/17  CT RENAL STONE STUDY   Narrative CLINICAL DATA:  Left-sided nephrolithiasis.  EXAM: CT ABDOMEN AND PELVIS WITHOUT CONTRAST  TECHNIQUE: Multidetector CT imaging of the abdomen and pelvis was performed following the standard protocol without IV contrast.  COMPARISON:  08/19/2017  FINDINGS: Lower chest: No acute abnormality.  Hepatobiliary: No focal liver abnormality is seen. Status post cholecystectomy. No biliary dilatation.  Pancreas: Unremarkable. No pancreatic ductal dilatation or surrounding inflammatory changes.  Spleen: Normal in size without focal abnormality.  Adrenals/Urinary Tract: The adrenal glands are normal. The right kidney is unremarkable. No mass or hydronephrosis. There  is left-sided pelvocaliectasis which appears similar to previous exam. A double-J nephroureteral stent is identified calcifications within the left mid ureter are identified, image 58 of series 2 through image 67 of series 2. No stones identified within the urinary bladder.  Stomach/Bowel: Postoperative changes from gastric bypass surgery identified. No dilated loops of small bowel identified. The appendix is visualized and appears normal. No pathologic dilatation of  the colon.  Vascular/Lymphatic: Normal appearance of the abdominal aorta. No enlarged retroperitoneal or mesenteric adenopathy. No enlarged pelvic or inguinal lymph nodes.  Reproductive: Status post hysterectomy. No adnexal masses.  Other: No abdominal wall hernia or abnormality. No abdominopelvic ascites.  Musculoskeletal: There is degenerative disc disease identified within the lumbar spine. No aggressive lytic or sclerotic bone lesions.  IMPRESSION: 1. Stable position of left-sided double-J nephroureteral stent. 2. Again noted are multiple stones within the left mid ureter. Persistent mild left-sided hydronephrosis.   Electronically Signed   By: Kerby Moors M.D.   On: 09/01/2017 08:58     Assessment & Plan:    There are no diagnoses linked to this encounter.  No follow-ups on file.  Hollice Espy, MD  [facility] [address], [place], [place] [address] [phone]

## 2018-07-21 NOTE — Progress Notes (Signed)
   07/21/2018 11:18 AM   Courtney Murphy 12-25-1955 [id number]  Reason for visit: Follow up to discuss 24 hr urine results  HPI: 62 year old female with a personal history of nephrolithiasis and gastric bypass returns today to discuss 24 hour urine results.  She denies any urinary symptoms today. No flank pain.  No interval stone episodes.    Stone analysis consistent with 70% calcium oxalate monohydrate, 30% calcium phosphate stone.  She has a history of PCNL and multiple URS.  ROS: Please see flowsheet from today's date for complete review of systems.  Physical Exam: BP (!) 164/83 (BP Location: Left Arm, Patient Position: Sitting, Cuff Size: Normal)   Pulse (!) 109   Ht 5\' 4"  (1.626 m)   Wt 182 lb 9.6 oz (82.8 kg)   BMI 31.34 kg/m    Constitutional:  Alert and oriented, No acute distress. Respiratory: Normal respiratory effort, no increased work of breathing. GI: Abdomen is soft, nontender, nondistended, no abdominal masses GU: No CVA tenderness Skin: No rashes, bruises or suspicious lesions. Neurologic: Grossly intact, no focal deficits, moving all 4 extremities. Psychiatric: Normal mood and affect  Laboratory Data: 24-hour urine personally reviewed.  Notable for low urine volume of 2.1 L, supersaturation of calcium oxalate significantly elevated at 11.45, calcium low normal at 169.  Urinary oxalate elevated at 71.  Elevated urine citrate at 686.  Urine pH 5.5.  Sodium 142.   Pertinent Imaging: None to review  Assessment & Plan:   In summary, Courtney Murphy is a 62 year old female with a history of gastric bypass and extensive history of urolithiasis requiring PCNL and multiple ureteroscopy in the past.  She is a calcium oxalate stone former.  She is here today to discuss her 24-hour urine results.  These are notable for low volume, and elevated oxalate.  We discussed at length nutritional strategies including adding a small amount of calcium such as a glass of milk with  all meals, especially high oxalate meals that include spinach, kale, potato chips, [place] fries, nuts.  Handout was provided.  We also discussed the importance of hydration with a goal urine output of 2.5 L/day.  Return in about 3 months (around 10/21/2018) for KUB and visit w Erlene Quan.  [place], [facility] [address] [place], [place] [address] [phone]

## 2018-07-21 NOTE — Patient Instructions (Signed)
Dietary Guidelines to Help Prevent Kidney Stones Kidney stones are deposits of minerals and salts that form inside your kidneys. Your risk of developing kidney stones may be greater depending on your diet, your lifestyle, the medicines you take, and whether you have certain medical conditions. Most people can reduce their chances of developing kidney stones by following the instructions below. Depending on your overall health and the type of kidney stones you tend to develop, your dietitian may give you more specific instructions. What are tips for following this plan? Reading food labels  Choose foods with "no salt added" or "low-salt" labels. Limit your sodium intake to less than 1500 mg per day.  Choose foods with calcium for each meal and snack. Try to eat about 300 mg of calcium at each meal. Foods that contain 200-500 mg of calcium per serving include: ? 8 oz (237 ml) of milk, fortified nondairy milk, and fortified fruit juice. ? 8 oz (237 ml) of kefir, yogurt, and soy yogurt. ? 4 oz (118 ml) of tofu. ? 1 oz of cheese. ? 1 cup (300 g) of dried figs. ? 1 cup (91 g) of cooked broccoli. ? 1-3 oz can of sardines or mackerel.  Most people need 1000 to 1500 mg of calcium each day. Talk to your dietitian about how much calcium is recommended for you. Shopping  Buy plenty of fresh fruits and vegetables. Most people do not need to avoid fruits and vegetables, even if they contain nutrients that may contribute to kidney stones.  When shopping for convenience foods, choose: ? Whole pieces of fruit. ? Premade salads with dressing on the side. ? Low-fat fruit and yogurt smoothies.  Avoid buying frozen meals or prepared deli foods.  Look for foods with live cultures, such as yogurt and kefir. Cooking  Do not add salt to food when cooking. Place a salt shaker on the table and allow each person to add his or her own salt to taste.  Use vegetable protein, such as beans, textured vegetable  protein (TVP), or tofu instead of meat in pasta, casseroles, and soups. Meal planning  Eat less salt, if told by your dietitian. To do this: ? Avoid eating processed or premade food. ? Avoid eating fast food.  Eat less animal protein, including cheese, meat, poultry, or fish, if told by your dietitian. To do this: ? Limit the number of times you have meat, poultry, fish, or cheese each week. Eat a diet free of meat at least 2 days a week. ? Eat only one serving each day of meat, poultry, fish, or seafood. ? When you prepare animal protein, cut pieces into small portion sizes. For most meat and fish, one serving is about the size of one deck of cards.  Eat at least 5 servings of fresh fruits and vegetables each day. To do this: ? Keep fruits and vegetables on hand for snacks. ? Eat 1 piece of fruit or a handful of berries with breakfast. ? Have a salad and fruit at lunch. ? Have two kinds of vegetables at dinner.  Limit foods that are high in a substance called oxalate. These include: ? Spinach. ? Rhubarb. ? Beets. ? Potato chips and french fries. ? Nuts.  If you regularly take a diuretic medicine, make sure to eat at least 1-2 fruits or vegetables high in potassium each day. These include: ? Avocado. ? Banana. ? Orange, prune, carrot, or tomato juice. ? Baked potato. ? Cabbage. ? Beans and split peas.   General instructions  Drink enough fluid to keep your urine clear or pale yellow. This is the most important thing you can do.  Talk to your health care provider and dietitian about taking daily supplements. Depending on your health and the cause of your kidney stones, you may be advised: ? Not to take supplements with vitamin C. ? To take a calcium supplement. ? To take a daily probiotic supplement. ? To take other supplements such as magnesium, fish oil, or vitamin B6.  Take all medicines and supplements as told by your health care provider.  Limit alcohol intake to no more  than 1 drink a day for nonpregnant women and 2 drinks a day for men. One drink equals 12 oz of beer, 5 oz of wine, or 1 oz of hard liquor.  Lose weight if told by your health care provider. Work with your dietitian to find strategies and an eating plan that works best for you. What foods are not recommended? Limit your intake of the following foods, or as told by your dietitian. Talk to your dietitian about specific foods you should avoid based on the type of kidney stones and your overall health. Grains Breads. Bagels. Rolls. Baked goods. Salted crackers. Cereal. Pasta. Vegetables Spinach. Rhubarb. Beets. Canned vegetables. Pickles. Olives. Meats and other protein foods Nuts. Nut butters. Large portions of meat, poultry, or fish. Salted or cured meats. Deli meats. Hot dogs. Sausages. Dairy Cheese. Beverages Regular soft drinks. Regular vegetable juice. Seasonings and other foods Seasoning blends with salt. Salad dressings. Canned soups. Soy sauce. Ketchup. Barbecue sauce. Canned pasta sauce. Casseroles. Pizza. Lasagna. Frozen meals. Potato chips. French fries. Summary  You can reduce your risk of kidney stones by making changes to your diet.  The most important thing you can do is drink enough fluid. You should drink enough fluid to keep your urine clear or pale yellow.  Ask your health care provider or dietitian how much protein from animal sources you should eat each day, and also how much salt and calcium you should have each day. This information is not intended to replace advice given to you by your health care provider. Make sure you discuss any questions you have with your health care provider. Document Released: 03/13/2011 Document Revised: 10/27/2016 Document Reviewed: 10/27/2016 Elsevier Interactive Patient Education  2018 Elsevier Inc.  

## 2018-07-27 ENCOUNTER — Ambulatory Visit: Payer: BC Managed Care – PPO | Admitting: Physician Assistant

## 2018-07-27 ENCOUNTER — Encounter: Payer: Self-pay | Admitting: Physician Assistant

## 2018-07-27 ENCOUNTER — Ambulatory Visit: Payer: BC Managed Care – PPO | Admitting: Unknown Physician Specialty

## 2018-07-27 VITALS — BP 149/94 | HR 85 | Temp 98.3°F | Ht 64.0 in | Wt 182.6 lb

## 2018-07-27 DIAGNOSIS — F900 Attention-deficit hyperactivity disorder, predominantly inattentive type: Secondary | ICD-10-CM

## 2018-07-27 DIAGNOSIS — E119 Type 2 diabetes mellitus without complications: Secondary | ICD-10-CM

## 2018-07-27 MED ORDER — ATOMOXETINE HCL 80 MG PO CAPS: 80.0000 mg | ORAL_CAPSULE | Freq: Every day | ORAL | 0 refills | Status: DC

## 2018-07-27 NOTE — Patient Instructions (Signed)

## 2018-07-27 NOTE — Progress Notes (Signed)
Subjective:    Patient ID: Courtney Murphy, female    DOB: 12-19-1955, 62 y.o.   MRN: [id number]  Courtney Murphy is a 62 y.o. female presenting on 07/27/2018 for ADHD (straterra refill)   HPI   Says she has a different PCP at [facility] but only comes here for strattera. Says her previous provider wanted somebody else to prescribe the strattera. She says she was diagnosed by Dr. Jeananne Rama at this practice with ADHD ~5 years ago. She is taking 80 mg once daily by mouth and has been for years. She reports it helps her concentrate and allows her to focus at work. Works part time at [employer].   Strattera 4 years  Evaluated by Dr. Jeananne Rama for ADHD at age 73.  Works part time - [employer]   Past Medical History:  Diagnosis Date  . Acid reflux 10/11/2015  . Anemia   . Arthritis   . Depression   . Diabetes mellitus without complication ([place])   . Fatty liver   . Gout   . History of kidney stones   . Hypertension   . Hypothyroidism   . Kidney stone   . Knee pain    Left  . Pulmonary emboli ([place])   . Restless leg syndrome   . Shoulder pain    Past Surgical History:  Procedure Laterality Date  . ABDOMINAL HYSTERECTOMY    . BACK SURGERY    . BREAST REDUCTION SURGERY    . CATARACT EXTRACTION W/PHACO Left 07/28/2017   Procedure: CATARACT EXTRACTION PHACO AND INTRAOCULAR LENS PLACEMENT ([place]) diabetic Left;  Surgeon: Leandrew Koyanagi, MD;  Location: [facility];  Service: Ophthalmology;  Laterality: Left;  Diabetic  . CESAREAN SECTION  1990  . CHOLECYSTECTOMY    . CYSTOSCOPY/URETEROSCOPY/HOLMIUM LASER/STENT PLACEMENT Left 09/08/2017   Procedure: CYSTOSCOPY/URETEROSCOPY/HOLMIUM LASER/STENT EXCHANGE;  Surgeon: Hollice Espy, MD;  Location: [facility];  Service: Urology;  Laterality: Left;  . FOOT SURGERY    . GASTRIC BYPASS    . IR NEPHROSTOMY PLACEMENT LEFT  08/17/2017  . KNEE ARTHROSCOPY Left   . KNEE ARTHROSCOPY Left 07/19/2015   Procedure: ARTHROSCOPY KNEE;  Surgeon: Leanor Kail, MD;  Location: [facility];  Service: Orthopedics;  Laterality: Left;  . NEPHROLITHOTOMY Left 08/17/2017   Procedure: NEPHROLITHOTOMY PERCUTANEOUS WITH HOLMIUM LASER;  Surgeon: Hollice Espy, MD;  Location: [facility];  Service: Urology;  Laterality: Left;  . THUMB ARTHROSCOPY    . TONSILLECTOMY     Social History   Socioeconomic History  . Marital status: Widowed    Spouse name: Not on file  . Number of children: Not on file  . Years of education: Not on file  . Highest education level: Not on file  Occupational History  . Not on file  Social Needs  . Financial resource strain: Not on file  . Food insecurity:    Worry: Not on file    Inability: Not on file  . Transportation needs:    Medical: Not on file    Non-medical: Not on file  Tobacco Use  . Smoking status: Former Smoker    Packs/day: 0.50    Types: Cigarettes    Last attempt to quit: 04/14/2015    Years since quitting: 3.2  . Smokeless tobacco: Never Used  Substance and Sexual Activity  . Alcohol use: No  . Drug use: No  . Sexual activity: Never  Lifestyle  . Physical activity:    Days per week: Not on file  Minutes per session: Not on file  . Stress: Not on file  Relationships  . Social connections:    Talks on phone: Not on file    Gets together: Not on file    Attends religious service: Not on file    Active member of club or organization: Not on file    Attends meetings of clubs or organizations: Not on file    Relationship status: Not on file  . Intimate partner violence:    Fear of current or ex partner: Not on file    Emotionally abused: Not on file    Physically abused: Not on file    Forced sexual activity: Not on file  Other Topics Concern  . Not on file  Social History Narrative  . Not on file   Family History  Problem Relation Age of Onset  . Cancer Mother        breast  . Heart disease Father   . Diabetes Father   . Hypertension Father   . Cancer Maternal Grandmother   .  Cancer Maternal Grandfather   . Heart disease Paternal Grandmother   . Heart disease Paternal Grandfather   . Cancer Maternal Aunt   . Cancer Maternal Uncle   . Heart disease Paternal Aunt   . Heart disease Paternal Uncle   . Kidney cancer Neg Hx   . Bladder Cancer Neg Hx    Current Outpatient Medications on File Prior to Visit  Medication Sig  . albuterol (PROVENTIL HFA) 108 (90 Base) MCG/ACT inhaler 2 puffs q.i.d. p.r.n. short of breath, wheezing, or cough  . atorvastatin (LIPITOR) 20 MG tablet   . B Complex Vitamins (VITAMIN B COMPLEX PO) Take 1 tablet by mouth daily.  . Biotin 5000 MCG CAPS Take 5,000 mcg by mouth daily.  . butalbital-acetaminophen-caffeine (FIORICET, ESGIC) 50-325-40 MG tablet Take 1-2 tablets by mouth every 6 (six) hours as needed for headache.  . cyanocobalamin (,VITAMIN B-12,) 1000 MCG/ML injection   . Dulaglutide (TRULICITY) 6.01 UX/3.2TF SOPN Inject 0.5 mLs into the skin every Thursday.  . escitalopram (LEXAPRO) 20 MG tablet Take 40 mg by mouth daily.   Marland Kitchen gabapentin (NEURONTIN) 300 MG capsule Take 300 mg by mouth at bedtime as needed. For nerve pain  . levothyroxine (SYNTHROID, LEVOTHROID) 100 MCG tablet Take 100 mcg by mouth daily before breakfast.  . pramipexole (MIRAPEX) 0.5 MG tablet Take 0.5 mg by mouth at bedtime. May take an additional 0.5 mg for restless legs   No current facility-administered medications on file prior to visit.     Review of Systems Per HPI unless specifically indicated above     Objective:    BP (!) 149/94 (BP Location: Left Arm, Cuff Size: Normal)   Pulse 85   Temp 98.3 F (36.8 C) (Oral)   Ht 5\' 4"  (1.626 m)   Wt 182 lb 9.6 oz (82.8 kg)   SpO2 99%   BMI 31.34 kg/m   Wt Readings from Last 3 Encounters:  07/27/18 182 lb 9.6 oz (82.8 kg)  07/21/18 182 lb 9.6 oz (82.8 kg)  06/19/18 180 lb (81.6 kg)    Physical Exam  Constitutional: She is oriented to person, place, and time. She appears well-developed and  well-nourished.  Cardiovascular: Normal rate and regular rhythm.  Pulmonary/Chest: Effort normal and breath sounds normal.  Neurological: She is alert and oriented to person, place, and time.  Skin: Skin is warm and dry.  Psychiatric: She has a normal mood and affect. Her  behavior is normal.   Results for orders placed or performed in visit on 06/20/18  ANA  Result Value Ref Range   Anti Nuclear Antibody(ANA) Negative Negative  C-reactive protein  Result Value Ref Range   CRP 2 0 - 10 mg/L  Rheumatoid factor  Result Value Ref Range   Rhuematoid fact SerPl-aCnc <10.0 0.0 - 13.9 IU/mL  Sedimentation rate  Result Value Ref Range   Sed Rate 18 0 - 40 mm/hr  Uric acid  Result Value Ref Range   Uric Acid 5.1 2.5 - 7.1 mg/dL  CBC with Differential/Platelet  Result Value Ref Range   WBC 9.2 3.4 - 10.8 x10E3/uL   RBC 4.40 3.77 - 5.28 x10E6/uL   Hemoglobin 13.9 11.1 - 15.9 g/dL   Hematocrit 39.3 34.0 - 46.6 %   MCV 89 79 - 97 fL   MCH 31.6 26.6 - 33.0 pg   MCHC 35.4 31.5 - 35.7 g/dL   RDW 12.7 12.3 - 15.4 %   Platelets 294 150 - 450 x10E3/uL   Neutrophils 68 Not Estab. %   Lymphs 26 Not Estab. %   Monocytes 4 Not Estab. %   Eos 2 Not Estab. %   Basos 0 Not Estab. %   Neutrophils Absolute 6.2 1.4 - 7.0 x10E3/uL   Lymphocytes Absolute 2.4 0.7 - 3.1 x10E3/uL   Monocytes Absolute 0.4 0.1 - 0.9 x10E3/uL   EOS (ABSOLUTE) 0.2 0.0 - 0.4 x10E3/uL   Basophils Absolute 0.0 0.0 - 0.2 x10E3/uL   Immature Granulocytes 0 Not Estab. %   Immature Grans (Abs) 0.0 0.0 - 0.1 x10E3/uL      Assessment & Plan:  1. Attention deficit hyperactivity disorder (ADHD), predominantly inattentive type  Patient has been on this medication for many years. Will refill as below. She sees a physician at [facility] medicine for all of her other issues, including diabetes. It would probably make the most sense for her to have that physician prescribe this medication. Otherwise, will see her back in clinic in 6  months.   - atomoxetine (STRATTERA) 80 MG capsule; Take 1 capsule (80 mg total) by mouth daily.  Dispense: 90 capsule; Refill: 0  2. Type 2 diabetes mellitus without complication, without long-term current use of insulin ([place])    Follow up plan: Return in about 6 months (around 01/27/2019) for strattera.  Carles Collet, PA-C [facility] 07/27/2018, 2:49 PM

## 2018-07-28 ENCOUNTER — Ambulatory Visit: Payer: BC Managed Care – PPO | Admitting: Urology

## 2018-09-15 ENCOUNTER — Encounter

## 2018-09-15 ENCOUNTER — Encounter: Payer: Self-pay | Admitting: Podiatry

## 2018-09-15 ENCOUNTER — Ambulatory Visit: Payer: BC Managed Care – PPO | Admitting: Podiatry

## 2018-09-15 DIAGNOSIS — G5761 Lesion of plantar nerve, right lower limb: Secondary | ICD-10-CM | POA: Diagnosis not present

## 2018-09-15 DIAGNOSIS — G5781 Other specified mononeuropathies of right lower limb: Secondary | ICD-10-CM

## 2018-09-15 DIAGNOSIS — G5762 Lesion of plantar nerve, left lower limb: Secondary | ICD-10-CM

## 2018-09-15 DIAGNOSIS — G5782 Other specified mononeuropathies of left lower limb: Secondary | ICD-10-CM

## 2018-09-15 NOTE — Progress Notes (Signed)
Presents today for bilateral feet follow-up requesting injections for painful neuromas.  Objective: Vital signs stable she is alert and oriented x3 has pain on palpation third interspace bilateral.  Assessment: Neuroma third interspace bilateral.  Plan: Injected the third interdigital space with 10 mg Kenalog 5 mg Marcaine bilateral foot.  Tolerated procedure well follow-up with me as needed.

## 2018-09-28 ENCOUNTER — Encounter: Payer: Self-pay | Admitting: Emergency Medicine

## 2018-09-28 ENCOUNTER — Emergency Department
Admission: EM | Admit: 2018-09-28 | Discharge: 2018-09-28 | Disposition: A | Discharge status: Home / Self Care | Payer: BC Managed Care – PPO | Attending: Emergency Medicine | Admitting: Emergency Medicine

## 2018-09-28 ENCOUNTER — Other Ambulatory Visit: Payer: Self-pay

## 2018-09-28 ENCOUNTER — Emergency Department: Payer: BC Managed Care – PPO

## 2018-09-28 DIAGNOSIS — E039 Hypothyroidism, unspecified: Secondary | ICD-10-CM | POA: Diagnosis not present

## 2018-09-28 DIAGNOSIS — Y929 Unspecified place or not applicable: Secondary | ICD-10-CM | POA: Insufficient documentation

## 2018-09-28 DIAGNOSIS — S52502A Unspecified fracture of the lower end of left radius, initial encounter for closed fracture: Secondary | ICD-10-CM | POA: Insufficient documentation

## 2018-09-28 DIAGNOSIS — Y939 Activity, unspecified: Secondary | ICD-10-CM | POA: Diagnosis not present

## 2018-09-28 DIAGNOSIS — S6992XA Unspecified injury of left wrist, hand and finger(s), initial encounter: Secondary | ICD-10-CM | POA: Diagnosis present

## 2018-09-28 DIAGNOSIS — W010XXA Fall on same level from slipping, tripping and stumbling without subsequent striking against object, initial encounter: Secondary | ICD-10-CM | POA: Diagnosis not present

## 2018-09-28 DIAGNOSIS — I10 Essential (primary) hypertension: Secondary | ICD-10-CM | POA: Insufficient documentation

## 2018-09-28 DIAGNOSIS — E119 Type 2 diabetes mellitus without complications: Secondary | ICD-10-CM | POA: Diagnosis not present

## 2018-09-28 DIAGNOSIS — Z87891 Personal history of nicotine dependence: Secondary | ICD-10-CM | POA: Diagnosis not present

## 2018-09-28 DIAGNOSIS — Y999 Unspecified external cause status: Secondary | ICD-10-CM | POA: Insufficient documentation

## 2018-09-28 DIAGNOSIS — Z79899 Other long term (current) drug therapy: Secondary | ICD-10-CM | POA: Insufficient documentation

## 2018-09-28 MED ORDER — HYDROCODONE-ACETAMINOPHEN 5-325 MG PO TABS: 1.0000 | ORAL_TABLET | ORAL | 0 refills | Status: DC | PRN

## 2018-09-28 NOTE — Discharge Instructions (Addendum)
Please call the number provided for orthopedics to arrange a follow-up appointment in approximately 1 week.  Return to the emergency department for any worsening pain, or any other symptom personally concerning to yourself.

## 2018-09-28 NOTE — ED Triage Notes (Signed)
Patient states she was barefoot across garage floor when she fell and caught herself with her hands. Reports pain to both wrists, worse in left. Swelling and deformity noted to left wrist. Good radial pulses.

## 2018-09-28 NOTE — ED Provider Notes (Signed)
[facility] Emergency Department Provider Note  Time seen: 8:04 PM  I have reviewed the triage vital signs and the nursing notes.   HISTORY  Chief Complaint Fall and Wrist Pain    HPI Courtney Murphy is a 62 y.o. female with a past medical history of anemia, depression, hypertension, presents to the emergency department with left wrist pain after a fall.  According to the patient she was walking her garage on wet cement when she slipped and fell landing on an outstretched left hand.  States immediate pain to the left wrist.  Denies hitting her head.  Denies any other injuries, besides slight pain to her right wrist.   Past Medical History:  Diagnosis Date  . Acid reflux 10/11/2015  . Anemia   . Arthritis   . Depression   . Diabetes mellitus without complication ([place])   . Fatty liver   . Gout   . History of kidney stones   . Hypertension   . Hypothyroidism   . Kidney stone   . Knee pain    Left  . Pulmonary emboli ([place])   . Restless leg syndrome   . Shoulder pain     Patient Active Problem List   Diagnosis Date Noted  . Kidney stone 08/17/2017  . Type 2 diabetes mellitus without complication, without long-term current use of insulin ([place]) 04/22/2017  . Chest pain 09/30/2016  . Acid reflux 10/11/2015  . Adult hypothyroidism 10/11/2015  . ADHD (attention deficit hyperactivity disorder) 10/11/2015  . Bariatric surgery status 10/22/2014  . HLD (hyperlipidemia) 08/18/2013    Past Surgical History:  Procedure Laterality Date  . ABDOMINAL HYSTERECTOMY    . BACK SURGERY    . BREAST REDUCTION SURGERY    . CATARACT EXTRACTION W/PHACO Left 07/28/2017   Procedure: CATARACT EXTRACTION PHACO AND INTRAOCULAR LENS PLACEMENT ([place]) diabetic Left;  Surgeon: Leandrew Koyanagi, MD;  Location: [place];  Service: Ophthalmology;  Laterality: Left;  Diabetic  . CESAREAN SECTION  1990  . CHOLECYSTECTOMY    . CYSTOSCOPY/URETEROSCOPY/HOLMIUM  LASER/STENT PLACEMENT Left 09/08/2017   Procedure: CYSTOSCOPY/URETEROSCOPY/HOLMIUM LASER/STENT EXCHANGE;  Surgeon: Hollice Espy, MD;  Location: [facility];  Service: Urology;  Laterality: Left;  . FOOT SURGERY    . GASTRIC BYPASS    . IR NEPHROSTOMY PLACEMENT LEFT  08/17/2017  . KNEE ARTHROSCOPY Left   . KNEE ARTHROSCOPY Left 07/19/2015   Procedure: ARTHROSCOPY KNEE;  Surgeon: Leanor Kail, MD;  Location: [facility];  Service: Orthopedics;  Laterality: Left;  . NEPHROLITHOTOMY Left 08/17/2017   Procedure: NEPHROLITHOTOMY PERCUTANEOUS WITH HOLMIUM LASER;  Surgeon: Hollice Espy, MD;  Location: [facility];  Service: Urology;  Laterality: Left;  . THUMB ARTHROSCOPY    . TONSILLECTOMY      Prior to Admission medications   Medication Sig Start Date End Date Taking? Authorizing Provider  albuterol (PROVENTIL HFA) 108 (90 Base) MCG/ACT inhaler 2 puffs q.i.d. p.r.n. short of breath, wheezing, or cough 07/06/18   [provider]  atomoxetine (STRATTERA) 80 MG capsule Take 1 capsule (80 mg total) by mouth daily. 07/27/18 10/25/18  Trinna Post, PA-C  atorvastatin (LIPITOR) 20 MG tablet  06/27/18   [provider]  B Complex Vitamins (VITAMIN B COMPLEX PO) Take 1 tablet by mouth daily.    [provider]  Biotin 5000 MCG CAPS Take 5,000 mcg by mouth daily.    [provider]  butalbital-acetaminophen-caffeine (FIORICET, ESGIC) 50-325-40 MG tablet Take 1-2 tablets by mouth every 6 (six) hours  as needed for headache. 11/01/17 11/01/18  Lavonia Drafts, MD  cyanocobalamin (,VITAMIN B-12,) 1000 MCG/ML injection  06/16/18   [provider]  Dulaglutide (TRULICITY) 6.96 EX/5.2WU SOPN Inject 0.5 mLs into the skin every Thursday.    [provider]  escitalopram (LEXAPRO) 10 MG tablet  09/13/18   [provider]  gabapentin (NEURONTIN) 300 MG capsule Take 300 mg by mouth at bedtime as needed. For nerve pain    [provider]  levothyroxine  (SYNTHROID, LEVOTHROID) 100 MCG tablet Take 100 mcg by mouth daily before breakfast.    [provider]  pramipexole (MIRAPEX) 0.5 MG tablet Take 0.5 mg by mouth at bedtime. May take an additional 0.5 mg for restless legs    [provider]    Allergies  Allergen Reactions  . Sulfasalazine Hives  . 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 Sulfa Antibiotics Hives  . Penicillins Hives and Rash    Has patient had a PCN reaction causing immediate rash, facial/tongue/throat swelling, SOB or lightheadedness with hypotension: No Has patient had a PCN reaction causing severe rash involving mucus membranes or skin necrosis: No Has patient had a PCN reaction that required hospitalization: No Has patient had a PCN reaction occurring within the last 10 years: No If all of the above answers are "NO", then may proceed with Cephalosporin use.     Family History  Problem Relation Age of Onset  . Cancer Mother        breast  . Heart disease Father   . Diabetes Father   . Hypertension Father   . Cancer Maternal Grandmother   . Cancer Maternal Grandfather   . Heart disease Paternal Grandmother   . Heart disease Paternal Grandfather   . Cancer Maternal Aunt   . Cancer Maternal Uncle   . Heart disease Paternal Aunt   . Heart disease Paternal Uncle   . Kidney cancer Neg Hx   . Bladder Cancer Neg Hx     Social History Social History   Tobacco Use  . Smoking status: Former Smoker    Packs/day: 0.50    Types: Cigarettes    Last attempt to quit: 04/14/2015    Years since quitting: 3.4  . Smokeless tobacco: Never Used  Substance Use Topics  . Alcohol use: No  . Drug use: No    Review of Systems Constitutional: Negative for loss of consciousness. Cardiovascular: Negative for chest pain. Respiratory: Negative for shortness of breath. Gastrointestinal: Negative for abdominal pain Genitourinary: Negative for urinary compaints Musculoskeletal: Bilateral wrist pain,  slight right wrist pain significant left wrist pain. Skin: Negative for skin complaints  Neurological: Negative for headache All other ROS negative  ____________________________________________   PHYSICAL EXAM:  VITAL SIGNS: ED Triage Vitals  Enc Vitals Group     BP 09/28/18 1800 (!) 178/93     Pulse Rate 09/28/18 1800 82     Resp 09/28/18 1800 20     Temp 09/28/18 1800 97.9 F (36.6 C)     Temp Source 09/28/18 1800 Oral     SpO2 09/28/18 1800 100 %     Weight 09/28/18 1800 180 lb (81.6 kg)     Height 09/28/18 1800 5\' 4"  (1.626 m)     Head Circumference --      Peak Flow --      Pain Score 09/28/18 1806 10     Pain Loc --      Pain Edu? --  Excl. in [place]? --    Constitutional: Alert and oriented. Well appearing and in no distress. Eyes: Normal exam ENT   Head: Normocephalic and atraumatic.   Mouth/Throat: Mucous membranes are moist. Cardiovascular: Normal rate, regular rhythm. No murmur Respiratory: Normal respiratory effort without tachypnea nor retractions. Breath sounds are clear Gastrointestinal: Soft and nontender. No distention.  Musculoskeletal: Slight right wrist pain although nontender to palpation great range of motion, no swelling or deformity.  Left wrist is moderately tender to palpation, significant pain with attempted range of motion.  Neurovascular intact distally. Neurologic:  Normal speech and language. No gross focal neurologic deficits  Skin:  Skin is warm, dry and intact.  Psychiatric: Mood and affect are normal.   ____________________________________________   RADIOLOGY  IMPRESSION: 1. Distal radius fracture involving the styloid and dorsal rim. 2. Trapezium resection since 2007.  ____________________________________________   INITIAL IMPRESSION / ASSESSMENT AND PLAN / ED COURSE  Pertinent labs & imaging results that were available during my care of the patient were reviewed by me and considered in my medical decision making (see  chart for details).  Patient presents emergency department after a fall on outstretched left hand with moderate left wrist pain.  Good range of motion with largely nontender right wrist.  Do not believe x-ray imaging is needed on the right.  X-ray of the left however does show a distal radius fracture involving the styloid and dorsal rim.  We will place in a volar wrist splint and have the patient follow-up with orthopedics in 1 week.  Patient agreeable to plan of care.  well-appearing splint.  Remains neurovascular intact distally.  We will discharge with Ortho follow-up.  ____________________________________________   FINAL CLINICAL IMPRESSION(S) / ED DIAGNOSES  Left distal radius fracture    Harvest Dark, MD 09/28/18 2014

## 2018-10-03 DIAGNOSIS — S52509A Unspecified fracture of the lower end of unspecified radius, initial encounter for closed fracture: Secondary | ICD-10-CM | POA: Insufficient documentation

## 2018-10-15 ENCOUNTER — Other Ambulatory Visit: Payer: Self-pay | Admitting: Physician Assistant

## 2018-10-15 DIAGNOSIS — F900 Attention-deficit hyperactivity disorder, predominantly inattentive type: Secondary | ICD-10-CM

## 2018-10-17 NOTE — Telephone Encounter (Signed)
Refill request approved.  Patient has appt in January 2020 with provider.

## 2018-12-07 ENCOUNTER — Ambulatory Visit (INDEPENDENT_AMBULATORY_CARE_PROVIDER_SITE_OTHER): Payer: BC Managed Care – PPO | Admitting: Urology

## 2018-12-07 ENCOUNTER — Ambulatory Visit
Admission: RE | Admit: 2018-12-07 | Discharge: 2018-12-07 | Disposition: A | Discharge status: Home / Self Care | Payer: BC Managed Care – PPO | Attending: Urology | Admitting: Urology

## 2018-12-07 ENCOUNTER — Encounter: Payer: Self-pay | Admitting: Urology

## 2018-12-07 ENCOUNTER — Other Ambulatory Visit: Payer: Self-pay | Admitting: Radiology

## 2018-12-07 ENCOUNTER — Telehealth: Payer: Self-pay | Admitting: Radiology

## 2018-12-07 ENCOUNTER — Ambulatory Visit
Admission: RE | Admit: 2018-12-07 | Discharge: 2018-12-07 | Disposition: A | Discharge status: Home / Self Care | Payer: BC Managed Care – PPO | Source: Ambulatory Visit | Attending: Urology | Admitting: Urology

## 2018-12-07 DIAGNOSIS — N200 Calculus of kidney: Secondary | ICD-10-CM

## 2018-12-07 DIAGNOSIS — N209 Urinary calculus, unspecified: Secondary | ICD-10-CM | POA: Diagnosis present

## 2018-12-07 LAB — URINALYSIS, COMPLETE
Bilirubin, UA: NEGATIVE
Glucose, UA: NEGATIVE
Nitrite, UA: NEGATIVE
Protein, UA: NEGATIVE
Specific Gravity, UA: 1.025 (ref 1.005–1.030)
Urobilinogen, Ur: 0.2 mg/dL (ref 0.2–1.0)
pH, UA: 5.5 (ref 5.0–7.5)

## 2018-12-07 LAB — MICROSCOPIC EXAMINATION

## 2018-12-07 NOTE — H&P (View-Only) (Signed)
12/07/2018  3:59 PM   Courtney Murphy  04/02/1956 [id number]  Referring provider: Audelia Acton, MD No address on file  Chief Complaint  Patient presents with  . Nephrolithiasis    HPI: Courtney Murphy is a 63 y.o. female who presents today for follow-up on nephrolithiasis who returns today for routine 6 month f/u for her kidney stones.    KUB today shows 2 left lower pole stones measuring 9 mm and 6 mm.  This have increased in size sine her last KUB 6 months ago.    The patient denies any urinary symptoms today.  The patient denies any flank pain.    The patient has had no interval stone episodes.  The patient has increased water intake and been following a strict stone diet protocol.  She has had 24 hour metabolic work up 0998 which showed hyperoxuria and low urinary volume.  Dietary change recommended as well as increased fluid intake.  She has a history of gastric bypass and an extensive history of urolithiasis requiring PCNL and multiple URS.     PMH: Past Medical History:  Diagnosis Date  . Acid reflux 10/11/2015  . Anemia   . Arthritis   . Depression   . Diabetes mellitus without complication ([place])   . Fatty liver   . Gout   . History of kidney stones   . Hypertension   . Hypothyroidism   . Kidney stone   . Knee pain    Left  . Pulmonary emboli ([place])   . Restless leg syndrome   . Shoulder pain     Surgical History: Past Surgical History:  Procedure Laterality Date  . ABDOMINAL HYSTERECTOMY    . BACK SURGERY    . BREAST REDUCTION SURGERY    . CATARACT EXTRACTION W/PHACO Left 07/28/2017   Procedure: CATARACT EXTRACTION PHACO AND INTRAOCULAR LENS PLACEMENT ([place]) diabetic Left;  Surgeon: Leandrew Koyanagi, MD;  Location: [place];  Service: Ophthalmology;  Laterality: Left;  Diabetic  . CESAREAN SECTION  1990  . CHOLECYSTECTOMY    . CYSTOSCOPY/URETEROSCOPY/HOLMIUM LASER/STENT PLACEMENT Left 09/08/2017   Procedure:  CYSTOSCOPY/URETEROSCOPY/HOLMIUM LASER/STENT EXCHANGE;  Surgeon: Hollice Espy, MD;  Location: [facility];  Service: Urology;  Laterality: Left;  . FOOT SURGERY    . GASTRIC BYPASS    . IR NEPHROSTOMY PLACEMENT LEFT  08/17/2017  . KNEE ARTHROSCOPY Left   . KNEE ARTHROSCOPY Left 07/19/2015   Procedure: ARTHROSCOPY KNEE;  Surgeon: Leanor Kail, MD;  Location: [facility];  Service: Orthopedics;  Laterality: Left;  . NEPHROLITHOTOMY Left 08/17/2017   Procedure: NEPHROLITHOTOMY PERCUTANEOUS WITH HOLMIUM LASER;  Surgeon: Hollice Espy, MD;  Location: [facility];  Service: Urology;  Laterality: Left;  . THUMB ARTHROSCOPY    . TONSILLECTOMY      Home Medications:  Allergies as of 12/07/2018      Reactions   Sulfasalazine Hives   Nsaids    Avoids because of gastric bypass   Sulfa Antibiotics Hives   Penicillins Hives, Rash   Has patient had a PCN reaction causing immediate rash, facial/tongue/throat swelling, SOB or lightheadedness with hypotension: No Has patient had a PCN reaction causing severe rash involving mucus membranes or skin necrosis: No Has patient had a PCN reaction that required hospitalization: No Has patient had a PCN reaction occurring within the last 10 years: No If all of the above answers are "NO", then may proceed with Cephalosporin use.      Medication List       Accurate  as of December 07, 2018  3:59 PM. Always use your most recent med list.        atomoxetine 80 MG capsule Commonly known as:  STRATTERA Take 1 capsule (80 mg total) by mouth daily.   atorvastatin 20 MG tablet Commonly known as:  LIPITOR   cyanocobalamin 1000 MCG/ML injection Commonly known as:  (VITAMIN B-12)   escitalopram 10 MG tablet Commonly known as:  LEXAPRO   gabapentin 300 MG capsule Commonly known as:  NEURONTIN Take 300 mg by mouth at bedtime as needed. For nerve pain   levothyroxine 100 MCG tablet Commonly known as:  SYNTHROID, LEVOTHROID Take 100 mcg by mouth daily before  breakfast.   pramipexole 0.5 MG tablet Commonly known as:  MIRAPEX Take 0.5 mg by mouth at bedtime. May take an additional 0.5 mg for restless legs   PROVENTIL HFA 108 (90 Base) MCG/ACT inhaler Generic drug:  albuterol 2 puffs q.i.d. p.r.n. short of breath, wheezing, or cough   TRULICITY 6.62 HU/7.6LY Sopn Generic drug:  Dulaglutide Inject 0.5 mLs into the skin every Thursday.       Allergies:  Allergies  Allergen Reactions  . Sulfasalazine Hives  . 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 Sulfa Antibiotics Hives  . Penicillins Hives and Rash    Has patient had a PCN reaction causing immediate rash, facial/tongue/throat swelling, SOB or lightheadedness with hypotension: No Has patient had a PCN reaction causing severe rash involving mucus membranes or skin necrosis: No Has patient had a PCN reaction that required hospitalization: No Has patient had a PCN reaction occurring within the last 10 years: No If all of the above answers are "NO", then may proceed with Cephalosporin use.     Family History: Family History  Problem Relation Age of Onset  . Cancer Mother        breast  . Heart disease Father   . Diabetes Father   . Hypertension Father   . Cancer Maternal Grandmother   . Cancer Maternal Grandfather   . Heart disease Paternal Grandmother   . Heart disease Paternal Grandfather   . Cancer Maternal Aunt   . Cancer Maternal Uncle   . Heart disease Paternal Aunt   . Heart disease Paternal Uncle   . Kidney cancer Neg Hx   . Bladder Cancer Neg Hx     Social History:  reports that she quit smoking about 3 years ago. Her smoking use included cigarettes. She smoked 0.50 packs per day. She has never used smokeless tobacco. She reports that she does not drink alcohol or use drugs.  ROS: UROLOGY Frequent Urination?: No Hard to postpone urination?: No Burning/pain with urination?: No Get up at night to urinate?: No Leakage of urine?: No Urine stream starts  and stops?: No Trouble starting stream?: No Do you have to strain to urinate?: No Blood in urine?: No Urinary tract infection?: No Sexually transmitted disease?: No Injury to kidneys or bladder?: No Painful intercourse?: No Weak stream?: No Currently pregnant?: No Vaginal bleeding?: No Last menstrual period?: n  Gastrointestinal Nausea?: No Vomiting?: No Indigestion/heartburn?: No Diarrhea?: No Constipation?: No  Constitutional Fever: No Night sweats?: No Weight loss?: No Fatigue?: No  Skin Skin rash/lesions?: No Itching?: No  Eyes Blurred vision?: No Double vision?: No  Ears/Nose/Throat Sore throat?: No Sinus problems?: No  Hematologic/Lymphatic Swollen glands?: No Easy bruising?: No  Cardiovascular Leg swelling?: No Chest pain?: No  Respiratory Cough?: No Shortness of breath?: No  Endocrine Excessive thirst?: No  Musculoskeletal Back pain?: No Joint pain?: No  Neurological Headaches?: No Dizziness?: No  Psychologic Depression?: No Anxiety?: No  Physical Exam: BP 128/85 (BP Location: Left Arm, Patient Position: Sitting)   Pulse 85   Ht 5\' 4"  (1.626 m)   Wt 176 lb (79.8 kg)   BMI 30.21 kg/m   Constitutional:  Alert and oriented, No acute distress. Cardiovascular: No clubbing, cyanosis, or edema. Respiratory: Normal respiratory effort, no increased work of breathing. GU: No CVA tenderness Skin: No rashes, bruises or suspicious lesions. Neurologic: Grossly intact, no focal deficits, moving all 4 extremities. Psychiatric: Normal mood and affect.  Laboratory Data: Lab Results  Component Value Date   WBC 9.2 06/21/2018   HGB 13.9 06/21/2018   HCT 39.3 06/21/2018   MCV 89 06/21/2018   PLT 294 06/21/2018    Lab Results  Component Value Date   CREATININE 0.88 11/01/2017   Pertinent Imaging: Results for orders placed during the hospital encounter of 12/07/18  Abdomen 1 view (KUB)   Narrative CLINICAL DATA:  Evaluate for possible  kidney stones  EXAM: ABDOMEN - 1 VIEW  COMPARISON:  04/27/2018  FINDINGS: Scattered large and small bowel gas is noted. Fecal material is noted throughout the colon consistent with a mild degree of constipation. Persistent calcifications are noted in the lower pole of the left kidney. The largest of these measures 9 mm in greatest dimension. No definitive right renal calculi are seen although the kidney is somewhat obscured by overlying bowel content. No definitive ureteral stones are seen. Degenerative changes of the lumbar spine are noted.  IMPRESSION: Multiple left lower pole renal calculi as described. No definitive ureteral stones are noted.   Electronically Signed   By: Inez Catalina M.D.   On: 12/07/2018 08:42    KUB personally reviewed today and compared to KUB 6 months ago.  Stones appear to have progressed slightly in size.   Assessment & Plan:    1. Left Nephrolithiasis - Discussed with patient today the results of her recent KUB. - Discussed with the patient the treatment options for the calculi seen in her KUB, continue observation vs surgical intervention.  The patient decided to undergo ureteroscopy. -Risks and benefits of ureteroscopy were reviewed including but not limited to infection, bleeding, pain, ureteral injury which could require open surgery versus prolonged indwelling if ureteral perforation occurs, persistent stone disease, requirement for staged procedure, possible stent, and global anesthesia risks. Patient expressed understanding and desires to proceed with ureteroscopy. - Reviewed with patient the dietary changes recommended for stone prevention.  Patient has been compliant with a strict stone diet. - Discussed with patient that there may be additional measures that can be taken to prevent future stones, depending on the stone analysis results.  I, Adele Schilder, am acting as a scribe for Hollice Espy, MD.    I have reviewed the above  documentation for accuracy and completeness, and I agree with the above.   Hollice Espy, MD    [facility] [address], [place], [place] [address] [phone]

## 2018-12-07 NOTE — Telephone Encounter (Signed)
Patient was given the [facility] Information form below as well as the Instructions for Pre-Admission Testing form & a map of [facility].   [place], [place], [place] [address] Telephone: [phone] Fax: [phone]   Thank you for choosing [facility] for your upcoming surgery!  We are always here to assist in your urological needs.  Please read the following information with specific details for your upcoming appointments related to your surgery. Please contact Lanetra Hartley at [phone] Option 3 with any questions.  The Name of Your Surgery: Left ureteroscopy, laser lithotripsy, stone removal, left ureteral stent placement Your Surgery Date: 12/14/2018 Your Surgeon: Hollice Espy  Please call Same Day Surgery at [phone] between the hours of 1pm-3pm one day prior to your surgery. They will inform you of the time to arrive at Same Day Surgery which is located on the second floor of the [facility].   Please refer to the attached letter regarding instructions for Pre-Admission Testing. You will receive a call from the [place] office regarding your appointment with them.  The Pre-Admission Testing office is located at [place], on the first floor of the [facility] in [place] (office is to the right as you enter through the [employer] of the [facility]). Please have all medications you are currently taking and your insurance card available.   Patient was advised to have nothing to eat or drink after midnight the night prior to surgery except that she may have only water until 2 hours before surgery with nothing to drink within 2 hours of surgery.  The patient states she currently takes no blood thinners. Patient's questions were answered and she expressed understanding of these instructions.

## 2018-12-07 NOTE — Progress Notes (Signed)
12/07/2018  3:59 PM   Courtney Murphy  November 27, 1956 [id number]  Referring provider: Audelia Acton, MD No address on file  Chief Complaint  Patient presents with  . Nephrolithiasis    HPI: Courtney Murphy is a 63 y.o. female who presents today for follow-up on nephrolithiasis who returns today for routine 6 month f/u for her kidney stones.    KUB today shows 2 left lower pole stones measuring 9 mm and 6 mm.  This have increased in size sine her last KUB 6 months ago.    The patient denies any urinary symptoms today.  The patient denies any flank pain.    The patient has had no interval stone episodes.  The patient has increased water intake and been following a strict stone diet protocol.  She has had 24 hour metabolic work up 0814 which showed hyperoxuria and low urinary volume.  Dietary change recommended as well as increased fluid intake.  She has a history of gastric bypass and an extensive history of urolithiasis requiring PCNL and multiple URS.     PMH: Past Medical History:  Diagnosis Date  . Acid reflux 10/11/2015  . Anemia   . Arthritis   . Depression   . Diabetes mellitus without complication ([place])   . Fatty liver   . Gout   . History of kidney stones   . Hypertension   . Hypothyroidism   . Kidney stone   . Knee pain    Left  . Pulmonary emboli ([place])   . Restless leg syndrome   . Shoulder pain     Surgical History: Past Surgical History:  Procedure Laterality Date  . ABDOMINAL HYSTERECTOMY    . BACK SURGERY    . BREAST REDUCTION SURGERY    . CATARACT EXTRACTION W/PHACO Left 07/28/2017   Procedure: CATARACT EXTRACTION PHACO AND INTRAOCULAR LENS PLACEMENT ([place]) diabetic Left;  Surgeon: Leandrew Koyanagi, MD;  Location: [facility];  Service: Ophthalmology;  Laterality: Left;  Diabetic  . CESAREAN SECTION  1990  . CHOLECYSTECTOMY    . CYSTOSCOPY/URETEROSCOPY/HOLMIUM LASER/STENT PLACEMENT Left 09/08/2017   Procedure:  CYSTOSCOPY/URETEROSCOPY/HOLMIUM LASER/STENT EXCHANGE;  Surgeon: Hollice Espy, MD;  Location: [facility];  Service: Urology;  Laterality: Left;  . FOOT SURGERY    . GASTRIC BYPASS    . IR NEPHROSTOMY PLACEMENT LEFT  08/17/2017  . KNEE ARTHROSCOPY Left   . KNEE ARTHROSCOPY Left 07/19/2015   Procedure: ARTHROSCOPY KNEE;  Surgeon: Leanor Kail, MD;  Location: [facility];  Service: Orthopedics;  Laterality: Left;  . NEPHROLITHOTOMY Left 08/17/2017   Procedure: NEPHROLITHOTOMY PERCUTANEOUS WITH HOLMIUM LASER;  Surgeon: Hollice Espy, MD;  Location: [facility];  Service: Urology;  Laterality: Left;  . THUMB ARTHROSCOPY    . TONSILLECTOMY      Home Medications:  Allergies as of 12/07/2018      Reactions   Sulfasalazine Hives   Nsaids    Avoids because of gastric bypass   Sulfa Antibiotics Hives   Penicillins Hives, Rash   Has patient had a PCN reaction causing immediate rash, facial/tongue/throat swelling, SOB or lightheadedness with hypotension: No Has patient had a PCN reaction causing severe rash involving mucus membranes or skin necrosis: No Has patient had a PCN reaction that required hospitalization: No Has patient had a PCN reaction occurring within the last 10 years: No If all of the above answers are "NO", then may proceed with Cephalosporin use.      Medication List       Accurate  as of December 07, 2018  3:59 PM. Always use your most recent med list.        atomoxetine 80 MG capsule Commonly known as:  STRATTERA Take 1 capsule (80 mg total) by mouth daily.   atorvastatin 20 MG tablet Commonly known as:  LIPITOR   cyanocobalamin 1000 MCG/ML injection Commonly known as:  (VITAMIN B-12)   escitalopram 10 MG tablet Commonly known as:  LEXAPRO   gabapentin 300 MG capsule Commonly known as:  NEURONTIN Take 300 mg by mouth at bedtime as needed. For nerve pain   levothyroxine 100 MCG tablet Commonly known as:  SYNTHROID, LEVOTHROID Take 100 mcg by mouth daily before  breakfast.   pramipexole 0.5 MG tablet Commonly known as:  MIRAPEX Take 0.5 mg by mouth at bedtime. May take an additional 0.5 mg for restless legs   PROVENTIL HFA 108 (90 Base) MCG/ACT inhaler Generic drug:  albuterol 2 puffs q.i.d. p.r.n. short of breath, wheezing, or cough   TRULICITY 3.66 QH/4.7ML Sopn Generic drug:  Dulaglutide Inject 0.5 mLs into the skin every Thursday.       Allergies:  Allergies  Allergen Reactions  . Sulfasalazine Hives  . 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 Sulfa Antibiotics Hives  . Penicillins Hives and Rash    Has patient had a PCN reaction causing immediate rash, facial/tongue/throat swelling, SOB or lightheadedness with hypotension: No Has patient had a PCN reaction causing severe rash involving mucus membranes or skin necrosis: No Has patient had a PCN reaction that required hospitalization: No Has patient had a PCN reaction occurring within the last 10 years: No If all of the above answers are "NO", then may proceed with Cephalosporin use.     Family History: Family History  Problem Relation Age of Onset  . Cancer Mother        breast  . Heart disease Father   . Diabetes Father   . Hypertension Father   . Cancer Maternal Grandmother   . Cancer Maternal Grandfather   . Heart disease Paternal Grandmother   . Heart disease Paternal Grandfather   . Cancer Maternal Aunt   . Cancer Maternal Uncle   . Heart disease Paternal Aunt   . Heart disease Paternal Uncle   . Kidney cancer Neg Hx   . Bladder Cancer Neg Hx     Social History:  reports that she quit smoking about 3 years ago. Her smoking use included cigarettes. She smoked 0.50 packs per day. She has never used smokeless tobacco. She reports that she does not drink alcohol or use drugs.  ROS: UROLOGY Frequent Urination?: No Hard to postpone urination?: No Burning/pain with urination?: No Get up at night to urinate?: No Leakage of urine?: No Urine stream starts  and stops?: No Trouble starting stream?: No Do you have to strain to urinate?: No Blood in urine?: No Urinary tract infection?: No Sexually transmitted disease?: No Injury to kidneys or bladder?: No Painful intercourse?: No Weak stream?: No Currently pregnant?: No Vaginal bleeding?: No Last menstrual period?: n  Gastrointestinal Nausea?: No Vomiting?: No Indigestion/heartburn?: No Diarrhea?: No Constipation?: No  Constitutional Fever: No Night sweats?: No Weight loss?: No Fatigue?: No  Skin Skin rash/lesions?: No Itching?: No  Eyes Blurred vision?: No Double vision?: No  Ears/Nose/Throat Sore throat?: No Sinus problems?: No  Hematologic/Lymphatic Swollen glands?: No Easy bruising?: No  Cardiovascular Leg swelling?: No Chest pain?: No  Respiratory Cough?: No Shortness of breath?: No  Endocrine Excessive thirst?: No  Musculoskeletal Back pain?: No Joint pain?: No  Neurological Headaches?: No Dizziness?: No  Psychologic Depression?: No Anxiety?: No  Physical Exam: BP 128/85 (BP Location: Left Arm, Patient Position: Sitting)   Pulse 85   Ht 5\' 4"  (1.626 m)   Wt 176 lb (79.8 kg)   BMI 30.21 kg/m   Constitutional:  Alert and oriented, No acute distress. Cardiovascular: No clubbing, cyanosis, or edema. Respiratory: Normal respiratory effort, no increased work of breathing. GU: No CVA tenderness Skin: No rashes, bruises or suspicious lesions. Neurologic: Grossly intact, no focal deficits, moving all 4 extremities. Psychiatric: Normal mood and affect.  Laboratory Data: Lab Results  Component Value Date   WBC 9.2 06/21/2018   HGB 13.9 06/21/2018   HCT 39.3 06/21/2018   MCV 89 06/21/2018   PLT 294 06/21/2018    Lab Results  Component Value Date   CREATININE 0.88 11/01/2017   Pertinent Imaging: Results for orders placed during the hospital encounter of 12/07/18  Abdomen 1 view (KUB)   Narrative CLINICAL DATA:  Evaluate for possible  kidney stones  EXAM: ABDOMEN - 1 VIEW  COMPARISON:  04/27/2018  FINDINGS: Scattered large and small bowel gas is noted. Fecal material is noted throughout the colon consistent with a mild degree of constipation. Persistent calcifications are noted in the lower pole of the left kidney. The largest of these measures 9 mm in greatest dimension. No definitive right renal calculi are seen although the kidney is somewhat obscured by overlying bowel content. No definitive ureteral stones are seen. Degenerative changes of the lumbar spine are noted.  IMPRESSION: Multiple left lower pole renal calculi as described. No definitive ureteral stones are noted.   Electronically Signed   By: Inez Catalina M.D.   On: 12/07/2018 08:42    KUB personally reviewed today and compared to KUB 6 months ago.  Stones appear to have progressed slightly in size.   Assessment & Plan:    1. Left Nephrolithiasis - Discussed with patient today the results of her recent KUB. - Discussed with the patient the treatment options for the calculi seen in her KUB, continue observation vs surgical intervention.  The patient decided to undergo ureteroscopy. -Risks and benefits of ureteroscopy were reviewed including but not limited to infection, bleeding, pain, ureteral injury which could require open surgery versus prolonged indwelling if ureteral perforation occurs, persistent stone disease, requirement for staged procedure, possible stent, and global anesthesia risks. Patient expressed understanding and desires to proceed with ureteroscopy. - Reviewed with patient the dietary changes recommended for stone prevention.  Patient has been compliant with a strict stone diet. - Discussed with patient that there may be additional measures that can be taken to prevent future stones, depending on the stone analysis results.  I, Adele Schilder, am acting as a scribe for Hollice Espy, MD.    I have reviewed the above  documentation for accuracy and completeness, and I agree with the above.   Hollice Espy, MD    [facility] [address] [place], [place] [address] [phone]

## 2018-12-09 ENCOUNTER — Inpatient Hospital Stay: Admission: RE | Admit: 2018-12-09 | Payer: BC Managed Care – PPO | Source: Ambulatory Visit

## 2018-12-11 LAB — CULTURE, URINE COMPREHENSIVE

## 2018-12-12 ENCOUNTER — Encounter
Admission: RE | Admit: 2018-12-12 | Discharge: 2018-12-12 | Disposition: A | Discharge status: Home / Self Care | Payer: BC Managed Care – PPO | Source: Ambulatory Visit | Attending: Urology | Admitting: Urology

## 2018-12-12 ENCOUNTER — Other Ambulatory Visit: Payer: Self-pay

## 2018-12-12 HISTORY — DX: Unspecified asthma, uncomplicated: J45.909

## 2018-12-12 HISTORY — DX: Personal history of Methicillin resistant Staphylococcus aureus infection: Z86.14

## 2018-12-12 NOTE — Patient Instructions (Signed)
Your procedure is scheduled on: 12-14-18 [facility] Report to Same Day Surgery 2nd floor medical mall [facility] Entrance-take elevator on left to 2nd floor.  Check in with surgery information desk.) To find out your arrival time please call [phone] between 1PM - 3PM on 12-13-18 TUESDAY  Remember: Instructions that are not followed completely may result in serious medical risk, up to and including death, or upon the discretion of your surgeon and anesthesiologist your surgery may need to be rescheduled.    _x___ 1. Do not eat food after midnight the night before your procedure. NO GUM OR CANDY AFTER MIDNIGHT.  You may drink WATER up to 2 hours before you are scheduled to arrive at the hospital for your procedure.  Do not drink WATER within 2 hours of your scheduled arrival to the hospital.  Type 1 and type 2 diabetics should only drink water.   ____Ensure clear carbohydrate drink on the way to the hospital for bariatric patients  ____Ensure clear carbohydrate drink 3 hours before surgery for Dr Dwyane Luo patients if physician instructed.    __x__ 2. No Alcohol for 24 hours before or after surgery.   __x__3. No Smoking or e-cigarettes for 24 prior to surgery.  Do not use any chewable tobacco products for at least 6 hour prior to surgery   ____  4. Bring all medications with you on the day of surgery if instructed.    __x__ 5. Notify your doctor if there is any change in your medical condition     (cold, fever, infections).    x___6. On the morning of surgery brush your teeth with toothpaste and water.  You may rinse your mouth with mouth wash if you wish.  Do not swallow any toothpaste or mouthwash.   Do not wear jewelry, make-up, hairpins, clips or nail polish.  Do not wear lotions, powders, or perfumes. You may wear deodorant.  Do not shave 48 hours prior to surgery. Men may shave face and neck.  Do not bring valuables to the hospital.    [facility] is not responsible for any  belongings or valuables.               Contacts, dentures or bridgework may not be worn into surgery.  Leave your suitcase in the car. After surgery it may be brought to your room.  For patients admitted to the hospital, discharge time is determined by your treatment team.  _  Patients discharged the day of surgery will not be allowed to drive home.  You will need someone to drive you home and stay with you the night of your procedure.    Please read over the following fact sheets that you were given:   [facility] Preparing for Surgery   _x___ TAKE THE FOLLOWING MEDICATION THE MORNING OF SURGERY WITH A SMALL SIP OF WATER. These include:  1. LEVOTHYROXINE  2. LIPITOR  3. LEXAPRO  4.  5.  6.  ____Fleets enema or Magnesium Citrate as directed.   ____ Use CHG Soap or sage wipes as directed on instruction sheet   _X___ Use inhalers on the day of surgery and bring to hospital day of surgery-USE YOUR ALBUTEROL INHALER DAY OF SURGERY AND BRING TO [place]  ____ Stop Metformin and Janumet 2 days prior to surgery.    ____ Take 1/2 of usual insulin dose the night before surgery and none on the morning surgery.   ____ Follow recommendations from Cardiologist, Pulmonologist or PCP regarding  stopping Aspirin, Coumadin, Plavix ,Eliquis, Effient, or Pradaxa, and Pletal.  X____Stop Anti-inflammatories such as Advil, Aleve, Ibuprofen, Motrin, Naproxen, Naprosyn, Goodies powders or aspirin products NOW-OK to take Tylenol    ____ Stop supplements until after surgery.     ____ Bring C-Pap to the hospital.

## 2018-12-13 ENCOUNTER — Encounter
Admission: RE | Admit: 2018-12-13 | Discharge: 2018-12-13 | Disposition: A | Discharge status: Home / Self Care | Payer: BC Managed Care – PPO | Source: Ambulatory Visit | Attending: Urology | Admitting: Urology

## 2018-12-13 DIAGNOSIS — Z01810 Encounter for preprocedural cardiovascular examination: Secondary | ICD-10-CM

## 2018-12-13 DIAGNOSIS — Z01818 Encounter for other preprocedural examination: Secondary | ICD-10-CM | POA: Diagnosis not present

## 2018-12-13 LAB — CBC
HCT: 40.1 % (ref 36.0–46.0)
Hemoglobin: 13.4 g/dL (ref 12.0–15.0)
MCH: 30.9 pg (ref 26.0–34.0)
MCHC: 33.4 g/dL (ref 30.0–36.0)
MCV: 92.4 fL (ref 80.0–100.0)
Platelets: 269 10*3/uL (ref 150–400)
RBC: 4.34 MIL/uL (ref 3.87–5.11)
RDW: 12.1 % (ref 11.5–15.5)
WBC: 9.5 10*3/uL (ref 4.0–10.5)
nRBC: 0 % (ref 0.0–0.2)

## 2018-12-13 LAB — COMPREHENSIVE METABOLIC PANEL
ALK PHOS: 129 U/L — AB (ref 38–126)
ALT: 22 U/L (ref 0–44)
ANION GAP: 8 (ref 5–15)
AST: 21 U/L (ref 15–41)
Albumin: 4 g/dL (ref 3.5–5.0)
BUN: 16 mg/dL (ref 8–23)
CO2: 27 mmol/L (ref 22–32)
Calcium: 9.2 mg/dL (ref 8.9–10.3)
Chloride: 104 mmol/L (ref 98–111)
Creatinine, Ser: 0.6 mg/dL (ref 0.44–1.00)
GFR calc Af Amer: >60 mL/min (ref >60)
GFR calc non Af Amer: >60 mL/min (ref >60)
Glucose, Bld: 131 mg/dL — ABNORMAL HIGH (ref 70–99)
Potassium: 3.8 mmol/L (ref 3.5–5.1)
SODIUM: 139 mmol/L (ref 135–145)
Total Bilirubin: 0.5 mg/dL (ref 0.3–1.2)
Total Protein: 7 g/dL (ref 6.5–8.1)

## 2018-12-13 MED ORDER — CIPROFLOXACIN IN D5W 400 MG/200ML IV SOLN
400.0000 mg | INTRAVENOUS | Status: AC
  Administered 2018-12-14: 400 mg via INTRAVENOUS

## 2018-12-14 ENCOUNTER — Encounter
Admission: RE | Disposition: A | Discharge status: Home / Self Care | Payer: Self-pay | Source: Home / Self Care | Attending: Urology

## 2018-12-14 ENCOUNTER — Ambulatory Visit: Payer: BC Managed Care – PPO | Admitting: Anesthesiology

## 2018-12-14 ENCOUNTER — Ambulatory Visit
Admission: RE | Admit: 2018-12-14 | Discharge: 2018-12-14 | Disposition: A | Discharge status: Home / Self Care | Payer: BC Managed Care – PPO | Attending: Urology | Admitting: Urology

## 2018-12-14 ENCOUNTER — Other Ambulatory Visit: Payer: Self-pay

## 2018-12-14 ENCOUNTER — Encounter: Payer: Self-pay | Admitting: *Deleted

## 2018-12-14 DIAGNOSIS — Z79899 Other long term (current) drug therapy: Secondary | ICD-10-CM | POA: Diagnosis not present

## 2018-12-14 DIAGNOSIS — E119 Type 2 diabetes mellitus without complications: Secondary | ICD-10-CM | POA: Diagnosis not present

## 2018-12-14 DIAGNOSIS — N200 Calculus of kidney: Secondary | ICD-10-CM | POA: Insufficient documentation

## 2018-12-14 DIAGNOSIS — Z86711 Personal history of pulmonary embolism: Secondary | ICD-10-CM | POA: Insufficient documentation

## 2018-12-14 DIAGNOSIS — E039 Hypothyroidism, unspecified: Secondary | ICD-10-CM | POA: Diagnosis not present

## 2018-12-14 DIAGNOSIS — Z87891 Personal history of nicotine dependence: Secondary | ICD-10-CM | POA: Insufficient documentation

## 2018-12-14 DIAGNOSIS — I10 Essential (primary) hypertension: Secondary | ICD-10-CM | POA: Diagnosis not present

## 2018-12-14 DIAGNOSIS — Z87442 Personal history of urinary calculi: Secondary | ICD-10-CM | POA: Insufficient documentation

## 2018-12-14 DIAGNOSIS — Z886 Allergy status to analgesic agent status: Secondary | ICD-10-CM | POA: Insufficient documentation

## 2018-12-14 DIAGNOSIS — Z880 Allergy status to penicillin: Secondary | ICD-10-CM | POA: Insufficient documentation

## 2018-12-14 DIAGNOSIS — Z9884 Bariatric surgery status: Secondary | ICD-10-CM | POA: Insufficient documentation

## 2018-12-14 DIAGNOSIS — G2581 Restless legs syndrome: Secondary | ICD-10-CM | POA: Diagnosis not present

## 2018-12-14 DIAGNOSIS — Z7984 Long term (current) use of oral hypoglycemic drugs: Secondary | ICD-10-CM | POA: Diagnosis not present

## 2018-12-14 DIAGNOSIS — F329 Major depressive disorder, single episode, unspecified: Secondary | ICD-10-CM | POA: Diagnosis not present

## 2018-12-14 DIAGNOSIS — Z882 Allergy status to sulfonamides status: Secondary | ICD-10-CM | POA: Insufficient documentation

## 2018-12-14 HISTORY — PX: CYSTOSCOPY/URETEROSCOPY/HOLMIUM LASER/STENT PLACEMENT: SHX6546

## 2018-12-14 LAB — GLUCOSE, CAPILLARY
Glucose-Capillary: 92 mg/dL (ref 70–99)
Glucose-Capillary: 96 mg/dL (ref 70–99)

## 2018-12-14 SURGERY — CYSTOSCOPY/URETEROSCOPY/HOLMIUM LASER/STENT PLACEMENT
Anesthesia: General | Laterality: Left

## 2018-12-14 MED ORDER — PROPOFOL 10 MG/ML IV BOLUS
INTRAVENOUS | Status: DC | PRN
  Administered 2018-12-14: 150 mg via INTRAVENOUS

## 2018-12-14 MED ORDER — MEPERIDINE HCL 50 MG/ML IJ SOLN: 6.2500 mg | INTRAMUSCULAR | Status: DC | PRN

## 2018-12-14 MED ORDER — SUGAMMADEX SODIUM 200 MG/2ML IV SOLN
INTRAVENOUS | Status: AC
  Filled 2018-12-14: qty 2

## 2018-12-14 MED ORDER — SEVOFLURANE IN SOLN
RESPIRATORY_TRACT | Status: AC
  Filled 2018-12-14: qty 250

## 2018-12-14 MED ORDER — FENTANYL CITRATE (PF) 100 MCG/2ML IJ SOLN
INTRAMUSCULAR | Status: DC | PRN
  Administered 2018-12-14: 100 ug via INTRAVENOUS

## 2018-12-14 MED ORDER — PROPOFOL 10 MG/ML IV BOLUS
INTRAVENOUS | Status: AC
  Filled 2018-12-14: qty 20

## 2018-12-14 MED ORDER — DEXAMETHASONE SODIUM PHOSPHATE 10 MG/ML IJ SOLN
INTRAMUSCULAR | Status: DC | PRN
  Administered 2018-12-14: 8 mg via INTRAVENOUS

## 2018-12-14 MED ORDER — ONDANSETRON HCL 4 MG/2ML IJ SOLN
INTRAMUSCULAR | Status: DC | PRN
  Administered 2018-12-14: 4 mg via INTRAVENOUS

## 2018-12-14 MED ORDER — ONDANSETRON HCL 4 MG/2ML IJ SOLN
INTRAMUSCULAR | Status: AC
  Filled 2018-12-14: qty 2

## 2018-12-14 MED ORDER — LIDOCAINE HCL (PF) 2 % IJ SOLN
INTRAMUSCULAR | Status: AC
  Filled 2018-12-14: qty 10

## 2018-12-14 MED ORDER — FENTANYL CITRATE (PF) 100 MCG/2ML IJ SOLN: 25.0000 ug | INTRAMUSCULAR | Status: DC | PRN

## 2018-12-14 MED ORDER — FENTANYL CITRATE (PF) 100 MCG/2ML IJ SOLN
INTRAMUSCULAR | Status: AC
  Filled 2018-12-14: qty 2

## 2018-12-14 MED ORDER — MIDAZOLAM HCL 2 MG/2ML IJ SOLN
INTRAMUSCULAR | Status: DC | PRN
  Administered 2018-12-14: 2 mg via INTRAVENOUS

## 2018-12-14 MED ORDER — ROCURONIUM BROMIDE 100 MG/10ML IV SOLN
INTRAVENOUS | Status: DC | PRN
  Administered 2018-12-14: 50 mg via INTRAVENOUS

## 2018-12-14 MED ORDER — SUCCINYLCHOLINE CHLORIDE 20 MG/ML IJ SOLN
INTRAMUSCULAR | Status: AC
  Filled 2018-12-14: qty 1

## 2018-12-14 MED ORDER — OXYCODONE HCL 5 MG/5ML PO SOLN: 5.0000 mg | Freq: Once | ORAL | Status: DC | PRN

## 2018-12-14 MED ORDER — PROMETHAZINE HCL 25 MG/ML IJ SOLN: 6.2500 mg | INTRAMUSCULAR | Status: DC | PRN

## 2018-12-14 MED ORDER — MIDAZOLAM HCL 2 MG/2ML IJ SOLN
INTRAMUSCULAR | Status: AC
  Filled 2018-12-14: qty 2

## 2018-12-14 MED ORDER — CIPROFLOXACIN IN D5W 400 MG/200ML IV SOLN
INTRAVENOUS | Status: AC
  Filled 2018-12-14: qty 200

## 2018-12-14 MED ORDER — OXYBUTYNIN CHLORIDE 5 MG PO TABS: 5.0000 mg | ORAL_TABLET | Freq: Three times a day (TID) | ORAL | 0 refills | Status: DC | PRN

## 2018-12-14 MED ORDER — IOPAMIDOL (ISOVUE-200) INJECTION 41%
INTRAVENOUS | Status: DC | PRN
  Administered 2018-12-14: 10 mL

## 2018-12-14 MED ORDER — HYDROCODONE-ACETAMINOPHEN 5-325 MG PO TABS: 1.0000 | ORAL_TABLET | Freq: Four times a day (QID) | ORAL | 0 refills | Status: DC | PRN

## 2018-12-14 MED ORDER — SUGAMMADEX SODIUM 200 MG/2ML IV SOLN
INTRAVENOUS | Status: DC | PRN
  Administered 2018-12-14: 160 mg via INTRAVENOUS

## 2018-12-14 MED ORDER — TAMSULOSIN HCL 0.4 MG PO CAPS: 0.4000 mg | ORAL_CAPSULE | Freq: Every day | ORAL | 0 refills | Status: DC

## 2018-12-14 MED ORDER — SODIUM CHLORIDE 0.9 % IV SOLN
INTRAVENOUS | Status: DC
  Administered 2018-12-14: 11:00:00 via INTRAVENOUS

## 2018-12-14 MED ORDER — LIDOCAINE HCL (CARDIAC) PF 100 MG/5ML IV SOSY
PREFILLED_SYRINGE | INTRAVENOUS | Status: DC | PRN
  Administered 2018-12-14: 60 mg via INTRAVENOUS
  Administered 2018-12-14: 40 mg via INTRAVENOUS

## 2018-12-14 MED ORDER — ACETAMINOPHEN 10 MG/ML IV SOLN
INTRAVENOUS | Status: DC | PRN
  Administered 2018-12-14: 1000 mg via INTRAVENOUS

## 2018-12-14 MED ORDER — DEXAMETHASONE SODIUM PHOSPHATE 10 MG/ML IJ SOLN
INTRAMUSCULAR | Status: AC
  Filled 2018-12-14: qty 1

## 2018-12-14 MED ORDER — OXYCODONE HCL 5 MG PO TABS: 5.0000 mg | ORAL_TABLET | Freq: Once | ORAL | Status: DC | PRN

## 2018-12-14 MED ORDER — ROCURONIUM BROMIDE 50 MG/5ML IV SOLN
INTRAVENOUS | Status: AC
  Filled 2018-12-14: qty 1

## 2018-12-14 SURGICAL SUPPLY — 28 items
BAG DRAIN CYSTO-URO LG1000N (MISCELLANEOUS) ×3 IMPLANT
BASKET ZERO TIP 1.9FR (BASKET) ×2 IMPLANT
BRUSH SCRUB EZ 1% IODOPHOR (MISCELLANEOUS) ×3 IMPLANT
CATH URETL 5X70 OPEN END (CATHETERS) ×3 IMPLANT
CNTNR SPEC 2.5X3XGRAD LEK (MISCELLANEOUS) ×1
CONT SPEC 4OZ STER OR WHT (MISCELLANEOUS) ×2
CONTAINER SPEC 2.5X3XGRAD LEK (MISCELLANEOUS) IMPLANT
DRAPE UTILITY 15X26 TOWEL STRL (DRAPES) ×3 IMPLANT
FIBER LASER LITHO 273 (Laser) ×2 IMPLANT
GLOVE BIO SURGEON STRL SZ 6.5 (GLOVE) ×2 IMPLANT
GLOVE BIO SURGEONS STRL SZ 6.5 (GLOVE) ×1
GOWN STRL REUS W/ TWL LRG LVL3 (GOWN DISPOSABLE) ×2 IMPLANT
GOWN STRL REUS W/TWL LRG LVL3 (GOWN DISPOSABLE) ×4
GUIDEWIRE GREEN .038 145CM (MISCELLANEOUS) ×2 IMPLANT
INFUSOR MANOMETER BAG 3000ML (MISCELLANEOUS) ×3 IMPLANT
INTRODUCER DILATOR DOUBLE (INTRODUCER) ×2 IMPLANT
KIT TURNOVER CYSTO (KITS) ×3 IMPLANT
PACK CYSTO AR (MISCELLANEOUS) ×3 IMPLANT
SENSORWIRE 0.038 NOT ANGLED (WIRE) ×3
SET CYSTO W/LG BORE CLAMP LF (SET/KITS/TRAYS/PACK) ×3 IMPLANT
SHEATH URETERAL 12FRX35CM (MISCELLANEOUS) ×2 IMPLANT
SOL .9 NS 3000ML IRR  AL (IV SOLUTION) ×2
SOL .9 NS 3000ML IRR UROMATIC (IV SOLUTION) ×1 IMPLANT
STENT URET 6FRX24 CONTOUR (STENTS) ×2 IMPLANT
STENT URET 6FRX26 CONTOUR (STENTS) IMPLANT
SURGILUBE 2OZ TUBE FLIPTOP (MISCELLANEOUS) ×3 IMPLANT
WATER STERILE IRR 1000ML POUR (IV SOLUTION) ×3 IMPLANT
WIRE SENSOR 0.038 NOT ANGLED (WIRE) ×1 IMPLANT

## 2018-12-14 NOTE — Discharge Instructions (Addendum)
You have a ureteral stent in place.  This is a tube that extends from your kidney to your bladder.  This may cause urinary bleeding, burning with urination, and urinary frequency.  Please call our office or present to the ED if you develop fevers >101 or pain which is not able to be controlled with oral pain medications.  You may be given either Flomax and/ or ditropan to help with bladder spasms and stent pain in addition to pain medications.    Your stent is on a string taped your left inner thigh.  On Monday morning, you may untape this stent and pulled gently until it is removed in entirety.  If you have any issues, please call our office we will be glad to assist.  If the stent is removed prematurely, please call our office during business hours to let us know.  [facility] [address] [place], [place] [address] [phone]   AMBULATORY SURGERY  DISCHARGE INSTRUCTIONS   1) The drugs that you were given will stay in your system until tomorrow so for the next 24 hours you should not:  A) Drive an automobile B) Make any legal decisions C) Drink any alcoholic beverage   2) You may resume regular meals tomorrow.  Today it is better to start with liquids and gradually work up to solid foods.  You may eat anything you prefer, but it is better to start with liquids, then soup and crackers, and gradually work up to solid foods.   3) Please notify your doctor immediately if you have any unusual bleeding, trouble breathing, redness and pain at the surgery site, drainage, fever, or pain not relieved by medication.    4) Additional Instructions:        Please contact your physician with any problems or Same Day Surgery at [phone], Monday through Friday 6 am to 4 pm, or [place] at [facility] number at [phone].

## 2018-12-14 NOTE — Interval H&P Note (Signed)
History and Physical Interval Note:  12/14/2018 10:43 AM  Courtney Murphy  has presented today for surgery, with the diagnosis of left kidney stones  The various methods of treatment have been discussed with the patient and family. After consideration of risks, benefits and other options for treatment, the patient has consented to  Procedure(s): CYSTOSCOPY/URETEROSCOPY/HOLMIUM LASER/STENT PLACEMENT (Left) as a surgical intervention .  The patient's history has been reviewed, patient examined, no change in status, stable for surgery.  I have reviewed the patient's chart and labs.  Questions were answered to the patient's satisfaction.    [facility] CTAB  Hollice Espy

## 2018-12-14 NOTE — Anesthesia Procedure Notes (Signed)
Procedure Name: Intubation Date/Time: 12/14/2018 11:25 AM Performed by: Jonna Clark, CRNA Pre-anesthesia Checklist: Patient identified, Patient being monitored, Timeout performed, Emergency Drugs available and Suction available Patient Re-evaluated:Patient Re-evaluated prior to induction Oxygen Delivery Method: Circle system utilized Preoxygenation: Pre-oxygenation with 100% oxygen Induction Type: IV induction Ventilation: Mask ventilation without difficulty Laryngoscope Size: Mac and 3 Grade View: Grade I Tube type: Oral Tube size: 7.0 mm Number of attempts: 1 Placement Confirmation: ETT inserted through vocal cords under direct vision,  positive ETCO2 and breath sounds checked- equal and bilateral Secured at: 21 cm Tube secured with: Tape Dental Injury: Teeth and Oropharynx as per pre-operative assessment

## 2018-12-14 NOTE — Transfer of Care (Signed)
Immediate Anesthesia Transfer of Care Note  Patient: Courtney Murphy  Procedure(s) Performed: CYSTOSCOPY/URETEROSCOPY/HOLMIUM LASER/STENT PLACEMENT (Left )  Patient Location: PACU  Anesthesia Type:General  Level of Consciousness: drowsy and patient cooperative  Airway & Oxygen Therapy: Patient Spontanous Breathing and Patient connected to face mask oxygen  Post-op Assessment: Report given to RN and Post -op Vital signs reviewed and stable  Post vital signs: Reviewed and stable  Last Vitals:  Vitals Value Taken Time  BP 136/82 12/14/2018 12:29 PM  Temp 36 C 12/14/2018 12:29 PM  Pulse 72 12/14/2018 12:30 PM  Resp 11 12/14/2018 12:30 PM  SpO2 99 % 12/14/2018 12:30 PM  Vitals shown include unvalidated device data.  Last Pain:  Vitals:   12/14/18 1052  TempSrc: Temporal  PainSc: 0-No pain         Complications: No apparent anesthesia complications

## 2018-12-14 NOTE — Anesthesia Postprocedure Evaluation (Signed)
Anesthesia Post Note  Patient: Courtney Murphy  Procedure(s) Performed: CYSTOSCOPY/URETEROSCOPY/HOLMIUM LASER/STENT PLACEMENT (Left )  Patient location during evaluation: PACU Anesthesia Type: General Level of consciousness: awake and alert and oriented Pain management: pain level controlled Vital Signs Assessment: post-procedure vital signs reviewed and stable Respiratory status: spontaneous breathing, nonlabored ventilation and respiratory function stable Cardiovascular status: blood pressure returned to baseline and stable Postop Assessment: no signs of nausea or vomiting Anesthetic complications: no     Last Vitals:  Vitals:   12/14/18 1244 12/14/18 1259  BP: (!) 143/86 (!) 162/81  Pulse: 79 67  Resp: 13 18  Temp:    SpO2: 95% 98%    Last Pain:  Vitals:   12/14/18 1244  TempSrc:   PainSc: 0-No pain                 Aundre Hietala

## 2018-12-14 NOTE — Anesthesia Post-op Follow-up Note (Signed)
Anesthesia QCDR form completed.        

## 2018-12-14 NOTE — Anesthesia Preprocedure Evaluation (Signed)
Anesthesia Evaluation  Patient identified by MRN, date of birth, ID band Patient awake    Reviewed: Allergy & Precautions, NPO status , Patient's Chart, lab work & pertinent test results  History of Anesthesia Complications Negative for: history of anesthetic complications  Airway Mallampati: II  TM Distance: >3 FB Neck ROM: Full    Dental no notable dental hx.    Pulmonary asthma , former smoker,    breath sounds clear to auscultation- rhonchi (-) wheezing      Cardiovascular Exercise Tolerance: Good hypertension, Pt. on medications (-) CAD, (-) Past MI, (-) Cardiac Stents and (-) CABG  Rhythm:Regular Rate:Normal - Systolic murmurs and - Diastolic murmurs    Neuro/Psych neg Seizures PSYCHIATRIC DISORDERS Depression negative neurological ROS     GI/Hepatic Neg liver ROS, GERD  ,  Endo/Other  diabetes, Oral Hypoglycemic AgentsHypothyroidism   Renal/GU Renal disease: nephrolithiasis.     Musculoskeletal  (+) Arthritis ,   Abdominal (+) + obese,   Peds  Hematology  (+) anemia ,   Anesthesia Other Findings Past Medical History: 10/11/2015: Acid reflux No date: Anemia     Comment:  RECEIVES IRON INFUSIONS No date: Arthritis No date: Asthma     Comment:  WELL CONTROLLED No date: Depression No date: Diabetes mellitus without complication ([facility]) No date: Fatty liver No date: Gout No date: History of kidney stones 2016: History of methicillin resistant staphylococcus aureus (MRSA) No date: Hypertension     Comment:  H/O BEEN OFF BP MEDS FOR 1 YEAR No date: Hypothyroidism No date: Kidney stone No date: Knee pain     Comment:  Left 2011: Pulmonary emboli ([facility]) No date: Restless leg syndrome No date: Shoulder pain   Reproductive/Obstetrics                             Anesthesia Physical Anesthesia Plan  ASA: II  Anesthesia Plan: General   Post-op Pain Management:    Induction:  Intravenous  PONV Risk Score and Plan: 2 and Ondansetron, Midazolam and Dexamethasone  Airway Management Planned: Oral ETT  Additional Equipment:   Intra-op Plan:   Post-operative Plan: Extubation in OR  Informed Consent: I have reviewed the patients History and Physical, chart, labs and discussed the procedure including the risks, benefits and alternatives for the proposed anesthesia with the patient or authorized representative who has indicated his/her understanding and acceptance.     Dental advisory given  Plan Discussed with: CRNA and Anesthesiologist  Anesthesia Plan Comments:         Anesthesia Quick Evaluation

## 2018-12-14 NOTE — Addendum Note (Signed)
Addendum  created 12/14/18 1312 by Emmie Niemann, MD   Intraprocedure Staff edited

## 2018-12-14 NOTE — Op Note (Signed)
Date of procedure: 12/14/18  Preoperative diagnosis:  1. Left kidney stones  Postoperative diagnosis:  1. Same as above  Procedure: 1. Left ureteroscopy 2. Laser lithotripsy 3. Left basket extraction of stone fragment 4. Left retrograde pyelogram 5. Left ureteral stent placement on tether  Surgeon: Hollice Espy, MD  Anesthesia: General  Complications: None  Intraoperative findings: Multiple stones measuring up to 6 mm treated, primarily in the lower pole.  EBL: Minimal  Specimens: None  Drains: 6 x 24 French double-J ureteral stent on left, tether left in place  Indication: Courtney Murphy is a 63 y.o. patient with history of recurrent nephrolithiasis with multiple left-sided nonobstructing stones who is elected to pursue ureteroscopy.  After reviewing the management options for treatment, she elected to proceed with the above surgical procedure(s). We have discussed the potential benefits and risks of the procedure, side effects of the proposed treatment, the likelihood of the patient achieving the goals of the procedure, and any potential problems that might occur during the procedure or recuperation. Informed consent has been obtained.  Description of procedure:  The patient was taken to the operating room and general anesthesia was induced.  The patient was placed in the dorsal lithotomy position, prepped and draped in the usual sterile fashion, and preoperative antibiotics were administered. A preoperative time-out was performed.   A 21 French cystoscope was advanced per urethra into the bladder.  Attention was turned to left ureteral switch to cannulate using a sensor wire up to level the kidney.  A dual-lumen sheath was used to introduce a Super Stiff wire as a working wire.  The safety wire snapped in place.  A Cook 12/14 French ureteral access sheath was advanced easily up to the level of the proximal ureter under fluoroscopic guidance.  The inner lumen was removed.  A  dual flexible digital ureteroscope was then advanced to the level of the kidney.  There is multiple stones identified primarily in the lower pole but as well as the midpole.  These were numerous spherical ranging up to 6 mm, approximately 15 or so individual stones.  The larger stones were fragmented using a 273 micro-laser fiber using the settings of 0.2 J and 40 Hz.  The remainder of the stones were able to be basketed out using a 1.9 [place] tipless nitinol basket.  The entire collecting system was cleared of stone burden.  A final retrograde pyelogram was performed which created a roadmap of the kidney which showed no extravasation.  This allowed me to visualization every calyx and confirm that all stones had been indeed addressed.  The scope was then backed down the length of the ureter inspecting the ureter along the way.  There are no ureteral injuries or trauma also appreciated.  A 624 French double-J ureteral stent was advanced over the wire up to level the kidney.  The wires partially drawn till focal stone within the renal pelvis.  The wires and fully withdrawn a full coils are within the bladder.  The tether was left on the distal coil and taped to the patient's left inner thigh using vessel Tegaderm.  She was clean and dry, repositioned the supine position, reversed anesthesia, taken the PACU in stable condition.  Plan: She will move her own stent on Monday.  She will follow-up in 4 weeks with renal ultrasound prior.  Hollice Espy, M.D.

## 2018-12-15 ENCOUNTER — Telehealth: Payer: Self-pay | Admitting: Urology

## 2018-12-15 NOTE — Telephone Encounter (Signed)
App had already been made Pt is aware

## 2018-12-15 NOTE — Telephone Encounter (Signed)
-----   Message from Amado Coe, [place] sent at 12/14/2018  4:40 PM EST -----  ----- Message ----- From: Hollice Espy, MD Sent: 12/14/2018  12:40 PM EST To: [facility]  Please schedule RUS in 4 weeks and then visit with me

## 2018-12-16 ENCOUNTER — Telehealth: Payer: Self-pay | Admitting: Urology

## 2018-12-16 NOTE — Telephone Encounter (Signed)
Pt called and states that she would like a call back, she has a couple questions about the surgery she had on Wed 12/14/18.

## 2018-12-16 NOTE — Telephone Encounter (Signed)
Patient called stating that she is experiencing more bleeding than she expected. Notices it more today, having to wear pads. Advised that some bleeding is normal. Patient stated that she is in extreme pain and will run out of pain medication after her last 2 today. Requesting more.

## 2018-12-16 NOTE — Telephone Encounter (Signed)
It sounds like she is not tolerating the stent.  It is been a few days, please have her go ahead and remove it.  That should help with her pain and discomfort.  I would strongly recommend that she take high doses of Motrin, 600 mg 3 times a day for pain control alternating with Tylenol as needed.  She also has Flomax and oxybutynin which will help.  Hollice Espy, MD

## 2018-12-16 NOTE — Telephone Encounter (Signed)
Called and advised patient of Dr Cherrie Gauze response. She was advised that she can remove the stent and alternate Motrin and Tylenol to relieve pain/discomfort and that her Oxybutynin and Flomax may help as well. Patient verbalized understanding.

## 2019-01-09 ENCOUNTER — Ambulatory Visit: Admission: RE | Admit: 2019-01-09 | Payer: BC Managed Care – PPO | Source: Ambulatory Visit

## 2019-01-10 ENCOUNTER — Ambulatory Visit
Admission: RE | Admit: 2019-01-10 | Discharge: 2019-01-10 | Disposition: A | Discharge status: Home / Self Care | Payer: BC Managed Care – PPO | Source: Ambulatory Visit | Attending: Urology | Admitting: Urology

## 2019-01-10 DIAGNOSIS — N200 Calculus of kidney: Secondary | ICD-10-CM | POA: Insufficient documentation

## 2019-01-11 ENCOUNTER — Encounter: Payer: Self-pay | Admitting: Podiatry

## 2019-01-11 ENCOUNTER — Ambulatory Visit (INDEPENDENT_AMBULATORY_CARE_PROVIDER_SITE_OTHER): Payer: BC Managed Care – PPO

## 2019-01-11 ENCOUNTER — Ambulatory Visit: Payer: BC Managed Care – PPO | Admitting: Podiatry

## 2019-01-11 DIAGNOSIS — G5762 Lesion of plantar nerve, left lower limb: Secondary | ICD-10-CM | POA: Diagnosis not present

## 2019-01-11 DIAGNOSIS — M76821 Posterior tibial tendinitis, right leg: Secondary | ICD-10-CM | POA: Diagnosis not present

## 2019-01-11 DIAGNOSIS — G5781 Other specified mononeuropathies of right lower limb: Secondary | ICD-10-CM

## 2019-01-11 DIAGNOSIS — G5761 Lesion of plantar nerve, right lower limb: Secondary | ICD-10-CM

## 2019-01-11 DIAGNOSIS — G5782 Other specified mononeuropathies of left lower limb: Secondary | ICD-10-CM

## 2019-01-11 DIAGNOSIS — D3613 Benign neoplasm of peripheral nerves and autonomic nervous system of lower limb, including hip: Secondary | ICD-10-CM | POA: Diagnosis not present

## 2019-01-11 NOTE — Progress Notes (Signed)
She presents today for follow-up of neuroma bilaterally states that they are hurting again and now I have pain is starting on the medial aspect of my right foot just proximal to my big toe he goes up into my leg and is been going on now for couple of months she also states that it feels like something is in the corner of the fifth toes bilaterally.  Objective: Vital signs are stable alert oriented x3 pulses are palpable.  Palpable Mulder's click third interspace bilateral exquisitely tender.  Sharp incurvated nail margin along the tibial border of the fifth toes bilaterally but no erythema cellulitis drainage or odor is mildly tender on palpation.  She also has what appears to be an insertional tendinitis of the tibialis anterior or the tibialis posterior tendon on the medial aspect of the foot.  There appears to be an area of pinpoint tenderness and radiographs do not demonstrate any type of osseous abnormalities in this area.  Assessment: Capsulitis tendinitis right foot at the first TMT.  She also has mild ingrown toenails fifth digits bilateral she also has neuromas third interdigital space bilaterally.  Plan: After sterile Betadine skin prep I injected 2 mg of dexamethasone point of maximal tenderness medial aspect of the foot with considerable pain.  I also injected the third interdigital space with 10 mg Kenalog 5 mg Marcaine for maximal tenderness bilaterally.  May need to consider alcohols next visit.

## 2019-01-11 NOTE — Progress Notes (Signed)
01/12/2019 9:02 AM   Courtney Murphy Aug 06, 1956 [id number]  Referring provider: Audelia Acton, MD No address on file  Chief Complaint  Patient presents with  . Nephrolithiasis    4wk w/RUS    HPI: Courtney Murphy is a 63 yo F who returns today for a 4 week f/u  s/p left ureteroscopy/lithotripsy/stent placement for the evaluation and management of left nephrolithiasis.   She underwent left ureteroscopy/lithotripy/stent placement on 12/14/2018 where multiple stones measuring up to 6 mm were treated, primarily in the lower pole.  Procedure was uncomplicated.  She did not tolerate stent well.    Her RUS from 01/10/2019 shows no hydronephrosis, left nephrolithiasis and ? probable 9 mm right UVJ stone.   She denies any issues after stent was removed.  No flank pain or gross hematuria.    She has had 24 hour metabolic work up 4650 which showed hyperoxuria and low urinary volume.  Dietary change recommended as well as increased fluid intake.She has a history of gastric bypass and an extensive history of urolithiasis requiring PCNL and multiple URS.     She reports of no bothersome urinary issues. She has also increased her water intake.   PMH: Past Medical History:  Diagnosis Date  . Acid reflux 10/11/2015  . Anemia    RECEIVES IRON INFUSIONS  . Arthritis   . Asthma    WELL CONTROLLED  . Depression   . Diabetes mellitus without complication ([place])   . Fatty liver   . Gout   . History of kidney stones   . History of methicillin resistant staphylococcus aureus (MRSA) 2016  . Hypertension    H/O BEEN OFF BP MEDS FOR 1 YEAR  . Hypothyroidism   . Kidney stone   . Knee pain    Left  . Pulmonary emboli ([place]) 2011  . Restless leg syndrome   . Shoulder pain     Surgical History: Past Surgical History:  Procedure Laterality Date  . ABDOMINAL HYSTERECTOMY    . BACK SURGERY    . BREAST REDUCTION SURGERY    . CATARACT EXTRACTION W/PHACO Left 07/28/2017   Procedure: CATARACT  EXTRACTION PHACO AND INTRAOCULAR LENS PLACEMENT ([place]) diabetic Left;  Surgeon: Leandrew Koyanagi, MD;  Location: [facility];  Service: Ophthalmology;  Laterality: Left;  Diabetic  . CESAREAN SECTION  1990  . CHOLECYSTECTOMY    . CYSTOSCOPY/URETEROSCOPY/HOLMIUM LASER/STENT PLACEMENT Left 09/08/2017   Procedure: CYSTOSCOPY/URETEROSCOPY/HOLMIUM LASER/STENT EXCHANGE;  Surgeon: Hollice Espy, MD;  Location: [facility];  Service: Urology;  Laterality: Left;  . CYSTOSCOPY/URETEROSCOPY/HOLMIUM LASER/STENT PLACEMENT Left 12/14/2018   Procedure: CYSTOSCOPY/URETEROSCOPY/HOLMIUM LASER/STENT PLACEMENT;  Surgeon: Hollice Espy, MD;  Location: [facility];  Service: Urology;  Laterality: Left;  . FOOT SURGERY    . GASTRIC BYPASS    . IR NEPHROSTOMY PLACEMENT LEFT  08/17/2017  . KNEE ARTHROSCOPY Left   . KNEE ARTHROSCOPY Left 07/19/2015   Procedure: ARTHROSCOPY KNEE;  Surgeon: Leanor Kail, MD;  Location: [facility];  Service: Orthopedics;  Laterality: Left;  . NEPHROLITHOTOMY Left 08/17/2017   Procedure: NEPHROLITHOTOMY PERCUTANEOUS WITH HOLMIUM LASER;  Surgeon: Hollice Espy, MD;  Location: [facility];  Service: Urology;  Laterality: Left;  . THUMB ARTHROSCOPY    . TONSILLECTOMY      Home Medications:  Allergies as of 01/12/2019      Reactions   Sulfasalazine Hives   Nsaids    Avoids because of gastric bypass   Sulfa Antibiotics Hives   Penicillins Hives, Rash   Has patient had  a PCN reaction causing immediate rash, facial/tongue/throat swelling, SOB or lightheadedness with hypotension: No Has patient had a PCN reaction causing severe rash involving mucus membranes or skin necrosis: No Has patient had a PCN reaction that required hospitalization: No Has patient had a PCN reaction occurring within the last 10 years: No If all of the above answers are "NO", then may proceed with Cephalosporin use.      Medication List       Accurate as of January 12, 2019  9:02 AM. Always use your most  recent med list.        atomoxetine 80 MG capsule Commonly known as:  STRATTERA Take 1 capsule (80 mg total) by mouth daily.   atorvastatin 20 MG tablet Commonly known as:  LIPITOR Take 20 mg by mouth every morning.   cyanocobalamin 1000 MCG/ML injection Commonly known as:  (VITAMIN B-12) Inject 1,000 mcg into the muscle every 7 (seven) days.   escitalopram 10 MG tablet Commonly known as:  LEXAPRO Take 20 mg by mouth every morning.   gabapentin 300 MG capsule Commonly known as:  NEURONTIN Take 300 mg by mouth at bedtime as needed (neuropathy).   levothyroxine 100 MCG tablet Commonly known as:  SYNTHROID, LEVOTHROID Take 100 mcg by mouth daily before breakfast.   pramipexole 0.5 MG tablet Commonly known as:  MIRAPEX Take 0.5 mg by mouth at bedtime. May take an additional 0.5 mg for restless legs   TRULICITY 9.92 EQ/6.8TM Sopn Generic drug:  Dulaglutide Inject 0.75 mg into the skin every Thursday.       Allergies:  Allergies  Allergen Reactions  . Sulfasalazine Hives  . 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 Sulfa Antibiotics Hives  . Penicillins Hives and Rash    Has patient had a PCN reaction causing immediate rash, facial/tongue/throat swelling, SOB or lightheadedness with hypotension: No Has patient had a PCN reaction causing severe rash involving mucus membranes or skin necrosis: No Has patient had a PCN reaction that required hospitalization: No Has patient had a PCN reaction occurring within the last 10 years: No If all of the above answers are "NO", then may proceed with Cephalosporin use.     Family History: Family History  Problem Relation Age of Onset  . Cancer Mother        breast  . Heart disease Father   . Diabetes Father   . Hypertension Father   . Cancer Maternal Grandmother   . Cancer Maternal Grandfather   . Heart disease Paternal Grandmother   . Heart disease Paternal Grandfather   . Cancer Maternal Aunt   . Cancer Maternal  Uncle   . Heart disease Paternal Aunt   . Heart disease Paternal Uncle   . Kidney cancer Neg Hx   . Bladder Cancer Neg Hx     Social History:  reports that she quit smoking about 3 years ago. Her smoking use included cigarettes. She has a 2.00 pack-year smoking history. She has never used smokeless tobacco. She reports current alcohol use. She reports that she does not use drugs.  ROS: UROLOGY Frequent Urination?: No Hard to postpone urination?: No Burning/pain with urination?: No Get up at night to urinate?: No Leakage of urine?: No Urine stream starts and stops?: No Trouble starting stream?: No Do you have to strain to urinate?: No Blood in urine?: No Urinary tract infection?: No Sexually transmitted disease?: No Injury to kidneys or bladder?: No Painful intercourse?: No Weak stream?:  No Currently pregnant?: No Vaginal bleeding?: No Last menstrual period?: n  Gastrointestinal Nausea?: No Vomiting?: No Indigestion/heartburn?: No Diarrhea?: No Constipation?: No  Constitutional Fever: No Night sweats?: No Weight loss?: No Fatigue?: No  Skin Skin rash/lesions?: No Itching?: No  Eyes Blurred vision?: No Double vision?: No  Ears/Nose/Throat Sore throat?: No Sinus problems?: No  Hematologic/Lymphatic Swollen glands?: No Easy bruising?: No  Cardiovascular Leg swelling?: No Chest pain?: No  Respiratory Cough?: No Shortness of breath?: No  Endocrine Excessive thirst?: No  Musculoskeletal Back pain?: No Joint pain?: No  Neurological Headaches?: No Dizziness?: No  Psychologic Depression?: No Anxiety?: No  Physical Exam: BP (!) 146/86   Pulse 91   Ht 5\' 4"  (1.626 m)   Wt 174 lb (78.9 kg)   BMI 29.87 kg/m   Constitutional:  Alert and oriented, No acute distress. HEENT: [place] AT, moist mucus membranes.  Trachea midline, no masses. Cardiovascular: No clubbing, cyanosis, or edema. Respiratory: Normal respiratory effort, no increased work of  breathing. GU: No CVA tenderness Skin: No rashes, bruises or suspicious lesions. Neurologic: Grossly intact, no focal deficits, moving all 4 extremities. Psychiatric: Normal mood and affect.  Pertinent Imaging: CLINICAL DATA:  Left nephrolithiasis.  EXAM: RENAL / URINARY TRACT ULTRASOUND COMPLETE  COMPARISON:  CT scan 08/31/2017  FINDINGS: Right Kidney:  Renal measurements: 12.0 x 5.5 x 5.0 cm = volume: 173 mL. 1.6 cm probable central sinus cyst. No hydronephrosis.  Left Kidney:  Renal measurements: 10.8 x 6.9 x 5.4 cm = volume: 210 mL. Echogenicity within normal limits. No mass or hydronephrosis visualized. Several tiny hyperechoic foci suggest stones.  Bladder:  Echogenic shadowing structure in the right UVJ suggests stone. A right ureteral jet is visible on color Doppler evaluation.  IMPRESSION: 1. No hydronephrosis. 2. Left nephrolithiasis. 3. Probable 9 mm right UVJ stone. Color Doppler evaluation shows a right ureteral jet around the stone.   Electronically Signed   By: Misty Stanley M.D.   On: 01/11/2019 09:50  I have personally reviewed the images.  There is some calcification/debris in the right UVJ although it is unclear whether or not this is truly obstructing stone, no evidence of hydronephrosis.  Assessment & Plan:    1. Left kidney stones S/p uncomplicated left ureteroscopy without residual hydronephrosis Reviewed stone diet in previous 24-hour results Return with [facility] prior in 9 months   2. Right distal ureteral stone ?? RUS from 01/10/2019 shows no hydronephrosis, left nephrolithiasis and a probable 9 mm right UVJ stone  Finding on renal ultrasound is somewhat confusing as she is never had stones on her right side and is asymptomatic without hydronephrosis Suspect this may be related to debris in her bladder or even possibly debris on her left distal ureter from recent ureteroscopy with impose laterality on renal ultrasound We reviewed  warning symptoms in detail and indications for more urgent/emergent evaluation We will plan for follow-up renal ultrasound in 4 to 6 weeks to assess for resolution of this questionable finding  Return for Repeat RUS in 4-6 weeks, will call with results.  [facility] [address], [place], [place] [address] [phone]  I, Lucas Mallow, am acting as a scribe for Dr. Hollice Espy,  I have reviewed the above documentation for accuracy and completeness, and I agree with the above.   Hollice Espy, MD

## 2019-01-12 ENCOUNTER — Encounter: Payer: Self-pay | Admitting: Urology

## 2019-01-12 ENCOUNTER — Ambulatory Visit (INDEPENDENT_AMBULATORY_CARE_PROVIDER_SITE_OTHER): Payer: BC Managed Care – PPO | Admitting: Urology

## 2019-01-12 VITALS — BP 146/86 | HR 91 | Ht 64.0 in | Wt 174.0 lb

## 2019-01-12 DIAGNOSIS — N201 Calculus of ureter: Secondary | ICD-10-CM | POA: Diagnosis not present

## 2019-01-12 DIAGNOSIS — N200 Calculus of kidney: Secondary | ICD-10-CM

## 2019-01-12 MED ORDER — ESTROGENS, CONJUGATED 0.625 MG/GM VA CREA: 1.0000 | TOPICAL_CREAM | Freq: Every day | VAGINAL | 12 refills | Status: DC

## 2019-01-14 ENCOUNTER — Encounter: Payer: Self-pay | Admitting: Urology

## 2019-01-14 ENCOUNTER — Other Ambulatory Visit: Payer: Self-pay

## 2019-01-14 ENCOUNTER — Inpatient Hospital Stay: Payer: BC Managed Care – PPO

## 2019-01-14 ENCOUNTER — Emergency Department
Admission: EM | Admit: 2019-01-14 | Discharge: 2019-01-14 | Disposition: A | Discharge status: Home / Self Care | Payer: BC Managed Care – PPO | Attending: Internal Medicine | Admitting: Internal Medicine

## 2019-01-14 ENCOUNTER — Emergency Department: Payer: BC Managed Care – PPO

## 2019-01-14 DIAGNOSIS — I200 Unstable angina: Secondary | ICD-10-CM

## 2019-01-14 DIAGNOSIS — Z880 Allergy status to penicillin: Secondary | ICD-10-CM | POA: Insufficient documentation

## 2019-01-14 DIAGNOSIS — Z9884 Bariatric surgery status: Secondary | ICD-10-CM | POA: Insufficient documentation

## 2019-01-14 DIAGNOSIS — E119 Type 2 diabetes mellitus without complications: Secondary | ICD-10-CM | POA: Insufficient documentation

## 2019-01-14 DIAGNOSIS — R0789 Other chest pain: Secondary | ICD-10-CM | POA: Insufficient documentation

## 2019-01-14 DIAGNOSIS — J45909 Unspecified asthma, uncomplicated: Secondary | ICD-10-CM | POA: Diagnosis not present

## 2019-01-14 DIAGNOSIS — Z79899 Other long term (current) drug therapy: Secondary | ICD-10-CM | POA: Insufficient documentation

## 2019-01-14 DIAGNOSIS — Z87891 Personal history of nicotine dependence: Secondary | ICD-10-CM | POA: Diagnosis not present

## 2019-01-14 DIAGNOSIS — I10 Essential (primary) hypertension: Secondary | ICD-10-CM | POA: Insufficient documentation

## 2019-01-14 DIAGNOSIS — R51 Headache: Secondary | ICD-10-CM | POA: Diagnosis not present

## 2019-01-14 DIAGNOSIS — Z86711 Personal history of pulmonary embolism: Secondary | ICD-10-CM | POA: Diagnosis not present

## 2019-01-14 DIAGNOSIS — E039 Hypothyroidism, unspecified: Secondary | ICD-10-CM | POA: Insufficient documentation

## 2019-01-14 DIAGNOSIS — Z8249 Family history of ischemic heart disease and other diseases of the circulatory system: Secondary | ICD-10-CM | POA: Diagnosis not present

## 2019-01-14 LAB — PROTIME-INR
INR: 0.92
Prothrombin Time: 12.3 seconds (ref 11.4–15.2)

## 2019-01-14 LAB — CBC
HCT: 40.2 % (ref 36.0–46.0)
Hemoglobin: 13.3 g/dL (ref 12.0–15.0)
MCH: 30.6 pg (ref 26.0–34.0)
MCHC: 33.1 g/dL (ref 30.0–36.0)
MCV: 92.4 fL (ref 80.0–100.0)
Platelets: 242 10*3/uL (ref 150–400)
RBC: 4.35 MIL/uL (ref 3.87–5.11)
RDW: 12.6 % (ref 11.5–15.5)
WBC: 11.7 10*3/uL — ABNORMAL HIGH (ref 4.0–10.5)
nRBC: 0 % (ref 0.0–0.2)

## 2019-01-14 LAB — BASIC METABOLIC PANEL
Anion gap: 8 (ref 5–15)
BUN: 20 mg/dL (ref 8–23)
CO2: 27 mmol/L (ref 22–32)
Calcium: 8.9 mg/dL (ref 8.9–10.3)
Chloride: 104 mmol/L (ref 98–111)
Creatinine, Ser: 0.85 mg/dL (ref 0.44–1.00)
GFR calc Af Amer: >60 mL/min (ref >60)
GFR calc non Af Amer: >60 mL/min (ref >60)
Glucose, Bld: 210 mg/dL — ABNORMAL HIGH (ref 70–99)
Potassium: 3.4 mmol/L — ABNORMAL LOW (ref 3.5–5.1)
Sodium: 139 mmol/L (ref 135–145)

## 2019-01-14 LAB — HEPATIC FUNCTION PANEL
ALT: 41 U/L (ref 0–44)
AST: 30 U/L (ref 15–41)
Albumin: 3.7 g/dL (ref 3.5–5.0)
Alkaline Phosphatase: 98 U/L (ref 38–126)
BILIRUBIN DIRECT: 0.2 mg/dL (ref 0.0–0.2)
Indirect Bilirubin: 0.4 mg/dL (ref 0.3–0.9)
Total Bilirubin: 0.6 mg/dL (ref 0.3–1.2)
Total Protein: 6.3 g/dL — ABNORMAL LOW (ref 6.5–8.1)

## 2019-01-14 LAB — TROPONIN I
Troponin I: <0.03 ng/mL (ref <0.03)
Troponin I: <0.03 ng/mL (ref <0.03)

## 2019-01-14 LAB — LIPASE, BLOOD: Lipase: 35 U/L (ref 11–51)

## 2019-01-14 LAB — APTT: aPTT: 26 seconds (ref 24–36)

## 2019-01-14 LAB — GLUCOSE, CAPILLARY: Glucose-Capillary: 92 mg/dL (ref 70–99)

## 2019-01-14 MED ORDER — ACETAMINOPHEN 325 MG PO TABS: 650.0000 mg | ORAL_TABLET | Freq: Four times a day (QID) | ORAL | Status: DC | PRN

## 2019-01-14 MED ORDER — LEVOTHYROXINE SODIUM 50 MCG PO TABS
100.0000 ug | ORAL_TABLET | Freq: Every day | ORAL | Status: DC
  Filled 2019-01-14: qty 2

## 2019-01-14 MED ORDER — ESCITALOPRAM OXALATE 10 MG PO TABS
20.0000 mg | ORAL_TABLET | ORAL | Status: DC
  Filled 2019-01-14: qty 2

## 2019-01-14 MED ORDER — HEPARIN SODIUM (PORCINE) 5000 UNIT/ML IJ SOLN: 4000.0000 [IU] | Freq: Once | INTRAMUSCULAR | Status: DC

## 2019-01-14 MED ORDER — ASPIRIN EC 81 MG PO TBEC
81.0000 mg | DELAYED_RELEASE_TABLET | Freq: Every day | ORAL | Status: DC
  Filled 2019-01-14: qty 1

## 2019-01-14 MED ORDER — ACETAMINOPHEN 650 MG RE SUPP: 650.0000 mg | Freq: Four times a day (QID) | RECTAL | Status: DC | PRN

## 2019-01-14 MED ORDER — INSULIN ASPART 100 UNIT/ML ~~LOC~~ SOLN: 0.0000 [IU] | Freq: Every day | SUBCUTANEOUS | Status: DC

## 2019-01-14 MED ORDER — IOHEXOL 350 MG/ML SOLN
75.0000 mL | Freq: Once | INTRAVENOUS | Status: AC | PRN
  Administered 2019-01-14: 75 mL via INTRAVENOUS

## 2019-01-14 MED ORDER — GABAPENTIN 300 MG PO CAPS: 300.0000 mg | ORAL_CAPSULE | Freq: Every evening | ORAL | Status: DC | PRN

## 2019-01-14 MED ORDER — ATORVASTATIN CALCIUM 20 MG PO TABS
20.0000 mg | ORAL_TABLET | ORAL | Status: DC
  Filled 2019-01-14: qty 1

## 2019-01-14 MED ORDER — HEPARIN (PORCINE) 25000 UT/250ML-% IV SOLN
800.0000 [IU]/h | INTRAVENOUS | Status: DC
  Administered 2019-01-14: 800 [IU]/h via INTRAVENOUS
  Filled 2019-01-14: qty 250

## 2019-01-14 MED ORDER — NITROGLYCERIN 0.4 MG SL SUBL
0.4000 mg | SUBLINGUAL_TABLET | SUBLINGUAL | Status: DC | PRN
  Administered 2019-01-14: 0.4 mg via SUBLINGUAL
  Filled 2019-01-14: qty 1

## 2019-01-14 MED ORDER — ATOMOXETINE HCL 40 MG PO CAPS
80.0000 mg | ORAL_CAPSULE | ORAL | Status: DC
  Filled 2019-01-14: qty 2

## 2019-01-14 MED ORDER — HEPARIN SODIUM (PORCINE) 5000 UNIT/ML IJ SOLN
3800.0000 [IU] | Freq: Once | INTRAMUSCULAR | Status: AC
  Administered 2019-01-14: 3800 [IU] via INTRAVENOUS

## 2019-01-14 MED ORDER — INSULIN ASPART 100 UNIT/ML ~~LOC~~ SOLN: 0.0000 [IU] | Freq: Three times a day (TID) | SUBCUTANEOUS | Status: DC

## 2019-01-14 MED ORDER — SODIUM CHLORIDE 0.9% FLUSH: 3.0000 mL | Freq: Once | INTRAVENOUS | Status: DC

## 2019-01-14 MED ORDER — LINACLOTIDE 145 MCG PO CAPS
145.0000 ug | ORAL_CAPSULE | Freq: Every day | ORAL | Status: DC
  Filled 2019-01-14: qty 1

## 2019-01-14 MED ORDER — ASPIRIN 81 MG PO TBEC: 81.0000 mg | DELAYED_RELEASE_TABLET | Freq: Every day | ORAL | 0 refills | Status: DC

## 2019-01-14 MED ORDER — METOPROLOL SUCCINATE ER 25 MG PO TB24: 25.0000 mg | ORAL_TABLET | Freq: Every day | ORAL | 0 refills | Status: DC

## 2019-01-14 MED ORDER — ONDANSETRON HCL 4 MG/2ML IJ SOLN: 4.0000 mg | Freq: Four times a day (QID) | INTRAMUSCULAR | Status: DC | PRN

## 2019-01-14 MED ORDER — ONDANSETRON HCL 4 MG PO TABS: 4.0000 mg | ORAL_TABLET | Freq: Four times a day (QID) | ORAL | Status: DC | PRN

## 2019-01-14 MED ORDER — PRAMIPEXOLE DIHYDROCHLORIDE 0.25 MG PO TABS
0.5000 mg | ORAL_TABLET | Freq: Every day | ORAL | Status: DC
  Filled 2019-01-14: qty 2

## 2019-01-14 MED ORDER — METOPROLOL TARTRATE 25 MG PO TABS
12.5000 mg | ORAL_TABLET | Freq: Two times a day (BID) | ORAL | Status: DC
  Filled 2019-01-14: qty 1

## 2019-01-14 MED ORDER — NITROGLYCERIN 2 % TD OINT
0.5000 [in_us] | TOPICAL_OINTMENT | Freq: Once | TRANSDERMAL | Status: DC
  Filled 2019-01-14: qty 1

## 2019-01-14 NOTE — ED Triage Notes (Signed)
Pt to ED via ems from home. Pt c/o chest pain new tonight, pt states she had central chest pain, with radiation to bilateral shoulders and felt light headed. Pt was given 324 asprin by ems. NAD.

## 2019-01-14 NOTE — Discharge Summary (Signed)
[facility] [place] NAME: Courtney Murphy    MR#:  [id number]  DATE OF BIRTH:  01-12-1956  DATE OF ADMISSION:  01/14/2019   DATE OF DISCHARGE: 01/14/2019 10:03 AM  PRIMARY CARE PHYSICIAN: Audelia Acton, MD    ADMISSION DIAGNOSIS:  Chest pain/EMS  DISCHARGE DIAGNOSIS:  Chest pain unspecified  SECONDARY DIAGNOSIS:   Past Medical History:  Diagnosis Date  . Acid reflux 10/11/2015  . Anemia    RECEIVES IRON INFUSIONS  . Arthritis   . Asthma    WELL CONTROLLED  . Depression   . Diabetes mellitus without complication ([place])   . Fatty liver   . Gout   . History of kidney stones   . History of methicillin resistant staphylococcus aureus (MRSA) 2016  . Hypertension    H/O BEEN OFF BP MEDS FOR 1 YEAR  . Hypothyroidism   . Kidney stone   . Knee pain    Left  . Pulmonary emboli ([place]) 2011  . Restless leg syndrome   . Shoulder pain     HOSPITAL COURSE:   1.  Chest pain unspecified.  The patient had headache neck pain and then developed chest pain and some shortness of breath.  She had 2 cardiac enzymes that were negative.  The patient was seen in consultation by Dr. Nehemiah Massed cardiology who recommended discharge home.  CT angiogram of the chest was negative for pulmonary embolism or dissection. 2.  Elevated blood pressure and sinus arrhythmia.  I did prescribe low-dose Toprol-XL.  Can consider a Holter monitor as outpatient follow-up appointment. 3.  Headache and neck pain have resolved. 4.  Hyperlipidemia unspecified on atorvastatin 5.  Hypothyroidism unspecified on levothyroxine 6.  Type 2 diabetes mellitus continue usual regimen 7.  Restless leg syndrome continue her regimen at home.  DISCHARGE CONDITIONS:   Satisfactory  CONSULTS OBTAINED:  Dr. Nehemiah Massed cardiology  DRUG ALLERGIES:   Allergies  Allergen Reactions  . Sulfasalazine Hives  . 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 Sulfa Antibiotics Hives  .  Penicillins Hives and Rash    Has patient had a PCN reaction causing immediate rash, facial/tongue/throat swelling, SOB or lightheadedness with hypotension: No Has patient had a PCN reaction causing severe rash involving mucus membranes or skin necrosis: No Has patient had a PCN reaction that required hospitalization: No Has patient had a PCN reaction occurring within the last 10 years: No If all of the above answers are "NO", then may proceed with Cephalosporin use.     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      Medication List    TAKE these medications   aspirin 81 MG EC tablet Take 1 tablet (81 mg total) by mouth daily.   atomoxetine 80 MG capsule Commonly known as:  STRATTERA Take 1 capsule (80 mg total) by mouth  daily. What changed:  when to take this   atorvastatin 20 MG tablet Commonly known as:  LIPITOR Take 20 mg by mouth every morning.   conjugated estrogens vaginal cream Commonly known as:  PREMARIN Place 1 Applicatorful vaginally daily. Use pea sized amount M-W-Fr before bedtime   cyanocobalamin 1000 MCG/ML injection Commonly known as:  (VITAMIN B-12) Inject 1,000 mcg into the muscle every 7 (seven) days.   escitalopram 10 MG tablet Commonly known as:  LEXAPRO Take 20 mg by mouth every morning.   gabapentin 300 MG capsule Commonly known as:  NEURONTIN Take 300 mg by mouth  at bedtime as needed (neuropathy).   levothyroxine 100 MCG tablet Commonly known as:  SYNTHROID, LEVOTHROID Take 100 mcg by mouth daily before breakfast.   LINZESS 145 MCG Caps capsule Generic drug:  linaclotide Take 1 capsule by mouth daily.   metoprolol succinate 25 MG 24 hr tablet Commonly known as:  TOPROL XL Take 1 tablet (25 mg total) by mouth at bedtime.   pramipexole 0.5 MG tablet Commonly known as:  MIRAPEX Take 0.5 mg by mouth at bedtime. May take an additional 0.5 mg for restless legs   TRULICITY 0.10 XN/2.3FT Sopn Generic drug:  Dulaglutide Inject 0.75 mg into the skin every Thursday.        DISCHARGE INSTRUCTIONS:   Follow-up PMD 5 days Follow-up Dr. Nehemiah Massed cardiology 1 week  If you experience worsening of your admission symptoms, develop shortness of breath, life threatening emergency, suicidal or homicidal thoughts you must seek medical attention immediately by calling 911 or calling your MD immediately  if symptoms less severe.  You Must read complete instructions/literature along with all the possible adverse reactions/side effects for all the Medicines you take and that have been prescribed to you. Take any new Medicines after you have completely understood and accept all the possible adverse reactions/side effects.   Please note  You were cared for by a hospitalist during your hospital stay. If you have any questions about your discharge medications or the care you received while you were in the hospital after you are discharged, you can call the unit and asked to speak with the hospitalist on call if the hospitalist that took care of you is not available. Once you are discharged, your primary care physician will handle any further medical issues. Please note that NO REFILLS for any discharge medications will be authorized once you are discharged, as it is imperative that you return to your primary care physician (or establish a relationship with a primary  care physician if you do not have one) for your aftercare needs so that they can reassess your need for medications and monitor your lab values.    Today   CHIEF COMPLAINT:   Chief Complaint  Patient presents with  . Chest Pain    HISTORY OF PRESENT ILLNESS:  Courtney Murphy  is a 63 y.o. female came in with chest pain   VITAL SIGNS:  Blood pressure (!) 168/97, pulse 89, temperature 98.3 F (36.8 C), temperature source Oral, resp. rate (!) 25, height 5\' 4"  (1.626 m), weight 78.9 kg, SpO2 100 %. Respirations were 16 when I saw her  PHYSICAL EXAMINATION:  GENERAL:  63 y.o.-year-old patient lying in the bed with no acute distress.  EYES: Pupils equal, round, reactive to light and accommodation. No scleral icterus. Extraocular muscles intact.  HEENT: Head atraumatic, normocephalic. Oropharynx and nasopharynx clear.  NECK:  Supple, no jugular venous distention. No thyroid enlargement, no tenderness.  LUNGS: Normal breath sounds bilaterally, no wheezing, rales,rhonchi or crepitation. No use of accessory muscles of respiration.  CARDIOVASCULAR: S1, S2 normal. No murmurs, rubs, or gallops.  ABDOMEN: Soft, non-tender, non-distended. Bowel sounds present. No organomegaly or mass.  EXTREMITIES: No pedal edema, cyanosis, or clubbing.  NEUROLOGIC: Cranial nerves II through XII are intact. Muscle strength 5/5 in all extremities. Sensation intact. Gait not checked.  PSYCHIATRIC: The patient is alert and oriented x 3.  SKIN: No obvious rash, lesion, or ulcer.   DATA REVIEW:   CBC Recent Labs  Lab 01/14/19 0219  WBC 11.7*  HGB 13.3  HCT 40.2  PLT 242    Chemistries  Recent Labs  Lab 01/14/19 0219  NA 139  K 3.4*  CL 104  CO2 27  GLUCOSE 210*  BUN 20  CREATININE 0.85  CALCIUM 8.9  AST 30  ALT 41  ALKPHOS 98  BILITOT 0.6    Cardiac Enzymes Recent Labs  Lab 01/14/19 0806  TROPONINI <0.03     RADIOLOGY:  Dg Chest 2 View  Result Date: 01/14/2019 CLINICAL DATA:   Chest pain tonight EXAM: CHEST - 2 VIEW COMPARISON:  October 08, 2016 FINDINGS: The heart size and mediastinal contours are within normal limits. Both lungs are clear. The visualized skeletal structures are stable. IMPRESSION: No active cardiopulmonary disease. Electronically Signed   By: Abelardo Diesel M.D.   On: 01/14/2019 02:50   Ct Head Wo Contrast  Result Date: 01/14/2019 CLINICAL DATA:  Headache EXAM: CT HEAD WITHOUT CONTRAST TECHNIQUE: Contiguous axial images were obtained from the base of the skull through the vertex without intravenous contrast. Note that the patient received intravenous contrast earlier in the day for CT angiogram chest. COMPARISON:  November 01, 2017 FINDINGS: Brain: The ventricles and sulci appear within normal limits. There is no intracranial mass, hemorrhage, extra-axial fluid collection, or midline shift. There is slight small vessel disease in the centra semiovale bilaterally. Brain parenchyma elsewhere appears unremarkable. No evident acute infarct. Vascular: No hyperdense vessel. No appreciable vascular calcifications are evident. Skull: The bony calvarium appears intact. Sinuses/Orbits: Visualized paranasal sinuses are clear. Visualized orbits appear symmetric bilaterally. Other: Visualized mastoid air cells are clear. IMPRESSION: Mild periventricular small vessel disease, stable. No acute infarct evident. No mass or hemorrhage. Electronically Signed   By: Lowella Grip III M.D.   On: 01/14/2019 07:35   Ct Angio Chest Pe W/cm &/or Wo Cm  Result Date: 01/14/2019 CLINICAL DATA:  Chest pain and shortness of breath EXAM: CT ANGIOGRAPHY CHEST WITH CONTRAST TECHNIQUE: Multidetector CT imaging of the chest was performed using the standard protocol during bolus administration of intravenous contrast. Multiplanar CT image reconstructions and MIPs were obtained to evaluate the vascular anatomy. CONTRAST:  66mL OMNIPAQUE IOHEXOL 350 MG/ML SOLN COMPARISON:  Chest CT September 30, 2016  and chest radiograph January 14, 2019 FINDINGS: Cardiovascular: There is no demonstrable pulmonary embolus. There is no thoracic aortic aneurysm or appreciable dissection. The visualized great vessels appear unremarkable. There is no appreciable pericardial effusion or pericardial thickening. The main pulmonary outflow tract measures 3.4 cm in diameter, prominent. Mediastinum/Nodes: Thyroid appears unremarkable. No adenopathy is appreciable in the thoracic region. No esophageal lesions are evident. Lungs/Pleura: There is patchy atelectasis bilaterally. There is no evident edema or consolidation. No pleural effusion or pleural thickening is evident. Upper Abdomen: Patient is status post gastric bypass procedure without complicating features in the visualized portions of the upper abdomen. Gallbladder is absent. Visualized upper abdominal structures otherwise appear unremarkable. Musculoskeletal:  There is degenerative change in the thoracic spine. There are no blastic or lytic bone lesions. No evident chest wall lesions. Review of the MIP images confirms the above findings. IMPRESSION: 1. No demonstrable pulmonary embolus. No appreciable thoracic aortic aneurysm or dissection. 2. Prominence of the main pulmonary outflow tract, a finding felt to be indicative of a degree of pulmonary arterial hypertension. 3. Areas of patchy atelectasis bilaterally. No edema or consolidation. 4.  No appreciable thoracic adenopathy. 5. Status post gastric bypass procedure without complicating features noted in the visualized regions of the upper abdomen. 6.  Gallbladder absent. Electronically Signed   By: Lowella Grip III M.D.   On: 01/14/2019 04:35      Management plans discussed with the patient, and she is in agreement.  CODE STATUS:  Code Status History    Date Active Date Inactive Code Status Order ID Comments User Context   01/14/2019 0649 01/14/2019 1308 Full Code [id number]  Sedalia Muta, MD ED   08/17/2017 1647  08/18/2017 1358 Full Code [id number]  Hollice Espy, MD Inpatient   09/30/2016 1551 10/02/2016 0040 Full Code [id number]  Henreitta Leber, MD ED    Advance Directive Documentation     Most Recent Value  Type of Advance Directive  Healthcare Power of [place], Out of facility DNR (pink MOST or yellow form), Living will  Pre-existing out of facility DNR order (yellow form or pink MOST form)  -  "MOST" Form in Place?  -      TOTAL TIME TAKING CARE OF THIS PATIENT: 32 minutes.    Loletha Grayer M.D on 01/14/2019 at 3:02 PM  Between 7am to 6pm - Pager - [phone]  After 6pm go to [email or web address] - password [email or web address] [facility]  [phone]  CC: Primary care physician; Audelia Acton, MD

## 2019-01-14 NOTE — Progress Notes (Signed)
ANTICOAGULATION CONSULT NOTE - Initial Consult  Pharmacy Consult for heparin drip Indication: chest pain/ACS  Allergies  Allergen Reactions  . Sulfasalazine Hives  . 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 Sulfa Antibiotics Hives  . Penicillins Hives and Rash    Has patient had a PCN reaction causing immediate rash, facial/tongue/throat swelling, SOB or lightheadedness with hypotension: No Has patient had a PCN reaction causing severe rash involving mucus membranes or skin necrosis: No Has patient had a PCN reaction that required hospitalization: No Has patient had a PCN reaction occurring within the last 10 years: No If all of the above answers are "NO", then may proceed with Cephalosporin use.     Patient Measurements: Height: 5\' 4"  (162.6 cm) Weight: 173 lb 15.1 oz (78.9 kg) IBW/kg (Calculated) : 54.7 Heparin Dosing Weight: 64 kg  Vital Signs: Temp: 98.5 F (36.9 C) (02/15 0158) Temp Source: Oral (02/15 0158) BP: 155/100 (02/15 0330) Pulse Rate: 71 (02/15 0330)  Labs: Recent Labs    01/14/19 0219  HGB 13.3  HCT 40.2  PLT 242  CREATININE 0.85  TROPONINI <0.03    Estimated Creatinine Clearance: 69.8 mL/min (by C-G formula based on SCr of 0.85 mg/dL).   Medical History: Past Medical History:  Diagnosis Date  . Acid reflux 10/11/2015  . Anemia    RECEIVES IRON INFUSIONS  . Arthritis   . Asthma    WELL CONTROLLED  . Depression   . Diabetes mellitus without complication ([place])   . Fatty liver   . Gout   . History of kidney stones   . History of methicillin resistant staphylococcus aureus (MRSA) 2016  . Hypertension    H/O BEEN OFF BP MEDS FOR 1 YEAR  . Hypothyroidism   . Kidney stone   . Knee pain    Left  . Pulmonary emboli ([place]) 2011  . Restless leg syndrome   . Shoulder pain     Medications:  Scheduled:  . heparin  3,800 Units Intravenous Once  . sodium chloride flush  3 mL Intravenous Once    Assessment: Patient arrives w/ c/o  central CP w/ radiation to bilateral shoulders and lightheadedness. On arrival first trops negative, EKG still pending, patient is not on any anticoagulation PTA. Patient is being started on heparin for UA  Goal of Therapy:  Heparin level 0.3-0.7 units/ml Monitor platelets by anticoagulation protocol: Yes   Plan:  Will bolus w/ heparin 3800 units IV x 1 Will start rate at 800 units/hr Baseline labs WNL, PT/INR and aPTT still pending. Will check anti-Xa @ 1100 Will monitor daily CBC's and adjust per anti-Xa levels.  Tobie Lords, PharmD, BCPS Clinical Pharmacist 01/14/2019

## 2019-01-14 NOTE — ED Provider Notes (Signed)
[facility] Emergency Department Provider Note  ____________________________________________   First MD Initiated Contact with Patient 01/14/19 [phone]     (approximate)  I have reviewed the triage vital signs and the nursing notes.   HISTORY  Chief Complaint Chest Pain    HPI Courtney Murphy is a 63 y.o. female with medical history as listed below which notably includes prior pulmonary emboli about 6 years ago with no apparent explanation and for which she no longer takes any anticoagulation including no aspirin.  She presents by EMS tonight for evaluation of acute onset chest pain with associated shortness of breath and neck pain.  She reports that she was not doing anything particular, had just folded finishing some laundry, when she had acute onset of severe pain and pressure in her anterior chest that radiated up into the left side of her neck.  She felt like it was hard to catch her breath.  The symptoms have lessened and are now mild to moderate and her shortness of breath has resolved but the chest discomfort persists.  She had aspirin 325 mg by EMS.  She denies fever/chills, nasal congestion, runny nose, cough, nausea, vomiting, abdominal pain.  She has a history of pulmonary emboli which she reports as 3 clots in her right lung, but she has no history of cardiac disease.  She has never seen a cardiologist.  She has hypertension and high cholesterol as well as tobacco use and a strong family history of heart disease with multiple family members having had heart attacks including her father who died of complications from heart disease.  Past Medical History:  Diagnosis Date  . Acid reflux 10/11/2015  . Anemia    RECEIVES IRON INFUSIONS  . Arthritis   . Asthma    WELL CONTROLLED  . Depression   . Diabetes mellitus without complication ([place])   . Fatty liver   . Gout   . History of kidney stones   . History of methicillin resistant staphylococcus aureus  (MRSA) 2016  . Hypertension    H/O BEEN OFF BP MEDS FOR 1 YEAR  . Hypothyroidism   . Kidney stone   . Knee pain    Left  . Pulmonary emboli ([place]) 2011  . Restless leg syndrome   . Shoulder pain     Patient Active Problem List   Diagnosis Date Noted  . Kidney stone 08/17/2017  . Type 2 diabetes mellitus without complication, without long-term current use of insulin ([place]) 04/22/2017  . Chest pain 09/30/2016  . Acid reflux 10/11/2015  . Adult hypothyroidism 10/11/2015  . ADHD (attention deficit hyperactivity disorder) 10/11/2015  . Bariatric surgery status 10/22/2014  . HLD (hyperlipidemia) 08/18/2013    Past Surgical History:  Procedure Laterality Date  . ABDOMINAL HYSTERECTOMY    . BACK SURGERY    . BREAST REDUCTION SURGERY    . CATARACT EXTRACTION W/PHACO Left 07/28/2017   Procedure: CATARACT EXTRACTION PHACO AND INTRAOCULAR LENS PLACEMENT ([place]) diabetic Left;  Surgeon: Leandrew Koyanagi, MD;  Location: [place];  Service: Ophthalmology;  Laterality: Left;  Diabetic  . CESAREAN SECTION  1990  . CHOLECYSTECTOMY    . CYSTOSCOPY/URETEROSCOPY/HOLMIUM LASER/STENT PLACEMENT Left 09/08/2017   Procedure: CYSTOSCOPY/URETEROSCOPY/HOLMIUM LASER/STENT EXCHANGE;  Surgeon: Hollice Espy, MD;  Location: [facility];  Service: Urology;  Laterality: Left;  . CYSTOSCOPY/URETEROSCOPY/HOLMIUM LASER/STENT PLACEMENT Left 12/14/2018   Procedure: CYSTOSCOPY/URETEROSCOPY/HOLMIUM LASER/STENT PLACEMENT;  Surgeon: Hollice Espy, MD;  Location: [facility];  Service: Urology;  Laterality: Left;  .  FOOT SURGERY    . GASTRIC BYPASS    . IR NEPHROSTOMY PLACEMENT LEFT  08/17/2017  . KNEE ARTHROSCOPY Left   . KNEE ARTHROSCOPY Left 07/19/2015   Procedure: ARTHROSCOPY KNEE;  Surgeon: Leanor Kail, MD;  Location: [facility];  Service: Orthopedics;  Laterality: Left;  . NEPHROLITHOTOMY Left 08/17/2017   Procedure: NEPHROLITHOTOMY PERCUTANEOUS WITH HOLMIUM LASER;  Surgeon: Hollice Espy, MD;   Location: [facility];  Service: Urology;  Laterality: Left;  . THUMB ARTHROSCOPY    . TONSILLECTOMY      Prior to Admission medications   Medication Sig Start Date End Date Taking? Authorizing Provider  atomoxetine (STRATTERA) 80 MG capsule Take 1 capsule (80 mg total) by mouth daily. Patient taking differently: Take 80 mg by mouth every morning.  10/17/18 01/15/19  Marnee Guarneri T, NP  atorvastatin (LIPITOR) 20 MG tablet Take 20 mg by mouth every morning.  06/27/18   [provider]  conjugated estrogens (PREMARIN) vaginal cream Place 1 Applicatorful vaginally daily. Use pea sized amount M-W-Fr before bedtime 01/12/19   Hollice Espy, MD  cyanocobalamin (,VITAMIN B-12,) 1000 MCG/ML injection Inject 1,000 mcg into the muscle every 7 (seven) days.  06/16/18   [provider]  Dulaglutide (TRULICITY) 7.03 JK/0.9FG SOPN Inject 0.75 mg into the skin every Thursday.     [provider]  escitalopram (LEXAPRO) 10 MG tablet Take 20 mg by mouth every morning.  09/13/18   [provider]  gabapentin (NEURONTIN) 300 MG capsule Take 300 mg by mouth at bedtime as needed (neuropathy).     [provider]  levothyroxine (SYNTHROID, LEVOTHROID) 100 MCG tablet Take 100 mcg by mouth daily before breakfast.    [provider]  pramipexole (MIRAPEX) 0.5 MG tablet Take 0.5 mg by mouth at bedtime. May take an additional 0.5 mg for restless legs    [provider]    Allergies Sulfasalazine; Nsaids; Sulfa antibiotics; and Penicillins  Family History  Problem Relation Age of Onset  . Cancer Mother        breast  . Heart disease Father   . Diabetes Father   . Hypertension Father   . Cancer Maternal Grandmother   . Cancer Maternal Grandfather   . Heart disease Paternal Grandmother   . Heart disease Paternal Grandfather   . Cancer Maternal Aunt   . Cancer Maternal Uncle   . Heart disease Paternal Aunt   . Heart disease Paternal Uncle   . Kidney  cancer Neg Hx   . Bladder Cancer Neg Hx     Social History Social History   Tobacco Use  . Smoking status: Former Smoker    Packs/day: 0.50    Years: 4.00    Pack years: 2.00    Types: Cigarettes    Last attempt to quit: 04/14/2015    Years since quitting: 3.7  . Smokeless tobacco: Never Used  Substance Use Topics  . Alcohol use: Yes    Comment: RARE  . Drug use: No    Review of Systems Constitutional: No fever/chills Eyes: No visual changes. ENT: No sore throat. Cardiovascular: Chest pain as described above  respiratory: Shortness of breath associated with chest pain as described above Gastrointestinal: No abdominal pain.  No nausea, no vomiting.  No diarrhea.  No constipation. Genitourinary: Negative for dysuria. Musculoskeletal: Neck pain associated with chest pain as described above.  Negative for back pain. Integumentary: Negative for rash. Neurological: Negative for headaches, focal weakness or numbness.   ____________________________________________  PHYSICAL EXAM:  VITAL SIGNS: ED Triage Vitals  Enc Vitals Group     BP 01/14/19 0158 (!) 171/98     Pulse Rate 01/14/19 0158 76     Resp 01/14/19 0158 16     Temp 01/14/19 0158 98.5 F (36.9 C)     Temp Source 01/14/19 0158 Oral     SpO2 01/14/19 0158 98 %     Weight 01/14/19 0152 78.9 kg (173 lb 15.1 oz)     Height 01/14/19 0152 1.626 m (5\' 4" )     Head Circumference --      Peak Flow --      Pain Score 01/14/19 0152 5     Pain Loc --      Pain Edu? --      Excl. in [place]? --     Constitutional: Alert and oriented.  Appears uncomfortable but is not in acute distress. Eyes: Conjunctivae are normal.  Head: Atraumatic. Nose: No congestion/rhinnorhea. Mouth/Throat: Mucous membranes are moist. Neck: No stridor.  No meningeal signs.   Cardiovascular: Normal rate, regular rhythm. Good peripheral circulation. Grossly normal heart sounds. Respiratory: Normal respiratory effort.  No retractions. Lungs  CTAB. Gastrointestinal: Soft and nontender. No distention.  Musculoskeletal: No lower extremity tenderness nor edema. No gross deformities of extremities. Neurologic:  Normal speech and language. No gross focal neurologic deficits are appreciated.  Skin:  Skin is warm, dry and intact. No rash noted. Psychiatric: Mood and affect are normal. Speech and behavior are normal.  ____________________________________________   LABS (all labs ordered are listed, but only abnormal results are displayed)  Labs Reviewed  BASIC METABOLIC PANEL - Abnormal; Notable for the following components:      Result Value   Potassium 3.4 (*)    Glucose, Bld 210 (*)    All other components within normal limits  CBC - Abnormal; Notable for the following components:   WBC 11.7 (*)    All other components within normal limits  HEPATIC FUNCTION PANEL - Abnormal; Notable for the following components:   Total Protein 6.3 (*)    All other components within normal limits  TROPONIN I  LIPASE, BLOOD  PROTIME-INR  APTT  HEPARIN LEVEL (UNFRACTIONATED)   ____________________________________________  EKG  ED ECG REPORT I, Hinda Kehr, the attending physician, personally viewed and interpreted this ECG.  Date: 01/14/2019 EKG Time: 2:03 AM Rate: 73 Rhythm: normal sinus rhythm QRS Axis: normal Intervals: normal ST/T Wave abnormalities: Non-specific ST segment / T-wave changes, but no clear evidence of acute ischemia. Narrative Interpretation: no definitive evidence of acute ischemia; does not meet STEMI criteria.  There is a a lot of baseline wander particularly in lead II but no evidence of acute ischemia.   ____________________________________________  RADIOLOGY I, Hinda Kehr, personally viewed and evaluated these images (plain radiographs) as part of my medical decision making, as well as reviewing the written report by the radiologist.  ED MD interpretation: No acute abnormalities on chest  x-ray  Official radiology report(s): Dg Chest 2 View  Result Date: 01/14/2019 CLINICAL DATA:  Chest pain tonight EXAM: CHEST - 2 VIEW COMPARISON:  October 08, 2016 FINDINGS: The heart size and mediastinal contours are within normal limits. Both lungs are clear. The visualized skeletal structures are stable. IMPRESSION: No active cardiopulmonary disease. Electronically Signed   By: Abelardo Diesel M.D.   On: 01/14/2019 02:50   Ct Angio Chest Pe W/cm &/or Wo Cm  Result Date: 01/14/2019 CLINICAL DATA:  Chest pain and shortness of  breath EXAM: CT ANGIOGRAPHY CHEST WITH CONTRAST TECHNIQUE: Multidetector CT imaging of the chest was performed using the standard protocol during bolus administration of intravenous contrast. Multiplanar CT image reconstructions and MIPs were obtained to evaluate the vascular anatomy. CONTRAST:  34mL OMNIPAQUE IOHEXOL 350 MG/ML SOLN COMPARISON:  Chest CT September 30, 2016 and chest radiograph January 14, 2019 FINDINGS: Cardiovascular: There is no demonstrable pulmonary embolus. There is no thoracic aortic aneurysm or appreciable dissection. The visualized great vessels appear unremarkable. There is no appreciable pericardial effusion or pericardial thickening. The main pulmonary outflow tract measures 3.4 cm in diameter, prominent. Mediastinum/Nodes: Thyroid appears unremarkable. No adenopathy is appreciable in the thoracic region. No esophageal lesions are evident. Lungs/Pleura: There is patchy atelectasis bilaterally. There is no evident edema or consolidation. No pleural effusion or pleural thickening is evident. Upper Abdomen: Patient is status post gastric bypass procedure without complicating features in the visualized portions of the upper abdomen. Gallbladder is absent. Visualized upper abdominal structures otherwise appear unremarkable. Musculoskeletal: There is degenerative change in the thoracic spine. There are no blastic or lytic bone lesions. No evident chest wall lesions.  Review of the MIP images confirms the above findings. IMPRESSION: 1. No demonstrable pulmonary embolus. No appreciable thoracic aortic aneurysm or dissection. 2. Prominence of the main pulmonary outflow tract, a finding felt to be indicative of a degree of pulmonary arterial hypertension. 3. Areas of patchy atelectasis bilaterally. No edema or consolidation. 4.  No appreciable thoracic adenopathy. 5. Status post gastric bypass procedure without complicating features noted in the visualized regions of the upper abdomen. 6.  Gallbladder absent. Electronically Signed   By: Lowella Grip III M.D.   On: 01/14/2019 04:35    ____________________________________________   PROCEDURES  Critical Care performed: yes   Procedure(s) performed:   .Critical Care Performed by: Hinda Kehr, MD Authorized by: Hinda Kehr, MD   Critical care provider statement:    Critical care time (minutes):  30   Critical care time was exclusive of:  Separately billable procedures and treating other patients   Critical care was necessary to treat or prevent imminent or life-threatening deterioration of the following conditions: unstable angina / ACS.   Critical care was time spent personally by me on the following activities:  Development of treatment plan with patient or surrogate, discussions with consultants, evaluation of patient's response to treatment, examination of patient, obtaining history from patient or surrogate, ordering and performing treatments and interventions, ordering and review of laboratory studies, ordering and review of radiographic studies, pulse oximetry, re-evaluation of patient's condition and review of old charts     ____________________________________________   INITIAL IMPRESSION / [place] / ED COURSE  As part of my medical decision making, I reviewed the following data within the [facility] notes reviewed and incorporated, Labs reviewed , EKG  interpreted , Old chart reviewed, Discussed with admitting physician  and Notes from prior ED visits    Differential diagnosis includes, but is not limited to, ACS, PE, musculoskeletal pain, pneumonia, pneumothorax, aortic dissection.  The patient certainly has multiple risk factors for ACS but I am concerned about the fact that she had pulmonary emboli in the past and is no longer taking any anticoagulation including no aspirin.  I am giving her a nitroglycerin to see if this helps with her persistent chest discomfort but I am also checking a CTA chest PE protocol because she is by definition high risk.  Her initial lab work is reassuring and  her blood pressure is come down but is still in the 492E systolic.  Her troponin is negative, chest x-ray unremarkable, no evidence of ischemia on EKG. I will reassess after the CTA and engage in joint decision-making with the patient about whether or not she should be admitted for chest pain this or whether she be appropriate for a second troponin and discharge with outpatient follow-up.  Clinical Course as of Jan 14 535  Sat Jan 14, 2019  1007 The patient has been resting but reports that she still feels the pressure on her chest.  Her CTA was negative for PE.  Given her symptoms I am concerned that this represents unstable angina.  I have paged the hospitalist to discuss the case I believe she would benefit at least from observation in the hospital and from in person cardiology evaluation.   [CF]  [phone] Given my concern for unstable angina and the patient's ongoing chest pressure, in addition to the full dose aspirin she has already received, I have ordered heparin bolus and infusion as per ACS/unstable angina protocol.  Awaiting hospitalist callback.   [CF]  [id number] I spoke by phone with Dr. Posey Pronto with the hospitalist service.  We discussed the case and he understands and agrees with plan for admission.  I told him about my plan for the heparin as well.   [CF]     Clinical Course User Index [CF] Hinda Kehr, MD    ____________________________________________  FINAL CLINICAL IMPRESSION(S) / ED DIAGNOSES  Final diagnoses:  Unstable angina ([place])     MEDICATIONS GIVEN DURING THIS VISIT:  Medications  sodium chloride flush (NS) 0.9 % injection 3 mL (3 mLs Intravenous Not Given 01/14/19 0211)  nitroGLYCERIN (NITROSTAT) SL tablet 0.4 mg (0.4 mg Sublingual Given 01/14/19 0350)  heparin injection 3,800 Units (has no administration in time range)  heparin ADULT infusion 100 units/mL (25000 units/289mL sodium chloride 0.45%) (has no administration in time range)  iohexol (OMNIPAQUE) 350 MG/ML injection 75 mL (75 mLs Intravenous Contrast Given 01/14/19 0355)     ED Discharge Orders    None       Note:  This document was prepared using Dragon voice recognition software and may include unintentional dictation errors.   Hinda Kehr, MD 01/14/19 [phone]

## 2019-01-14 NOTE — ED Notes (Signed)
Per cardiology patient will be discharged without further testing. Dr Earleen Newport notified.  Pt given food and drink.

## 2019-01-14 NOTE — H&P (Signed)
[facility] [place] NAME: Courtney Murphy    MR#:  [id number]  DATE OF BIRTH:  1956/01/02  DATE OF ADMISSION:  01/14/2019  PRIMARY CARE PHYSICIAN: Audelia Acton, MD   REQUESTING/REFERRING PHYSICIAN: Dr. Karma Greaser  CHIEF COMPLAINT:   Chief Complaint  Patient presents with  . Chest Pain    HISTORY OF PRESENT ILLNESS:  Courtney Murphy  is a 63 y.o. female with a known history listed below presented to emergency room for evaluation of chest tightness.  Patient has acute onset of chest tightness associated with shortness of breath and neck pain.  Patient has 8 out of 10 chest tightness tonight with shortness of breath.  Patient felt severe pain and pressure in the anterior chest.  Patient denies cough or sputum production.  No fever or chills.  No nausea or vomiting.  Patient is also complaining of bad headache.  No weakness or numbness.  Patient has history of pulmonary embolism.  No other complaints.  In emergency room patient underwent CTA chest that did not show pulmonary embolism.  Patient started on heparin drip for unstable angina.  Patient received nitroglycerin sublingual tablet.  Hospitalist team requested for admission.  PAST MEDICAL HISTORY:   Past Medical History:  Diagnosis Date  . Acid reflux 10/11/2015  . Anemia    RECEIVES IRON INFUSIONS  . Arthritis   . Asthma    WELL CONTROLLED  . Depression   . Diabetes mellitus without complication ([place])   . Fatty liver   . Gout   . History of kidney stones   . History of methicillin resistant staphylococcus aureus (MRSA) 2016  . Hypertension    H/O BEEN OFF BP MEDS FOR 1 YEAR  . Hypothyroidism   . Kidney stone   . Knee pain    Left  . Pulmonary emboli ([place]) 2011  . Restless leg syndrome   . Shoulder pain     PAST SURGICAL HISTORY:   Past Surgical History:  Procedure Laterality Date  . ABDOMINAL HYSTERECTOMY    . BACK SURGERY    . BREAST REDUCTION SURGERY    .  CATARACT EXTRACTION W/PHACO Left 07/28/2017   Procedure: CATARACT EXTRACTION PHACO AND INTRAOCULAR LENS PLACEMENT ([place]) diabetic Left;  Surgeon: Leandrew Koyanagi, MD;  Location: [facility];  Service: Ophthalmology;  Laterality: Left;  Diabetic  . CESAREAN SECTION  1990  . CHOLECYSTECTOMY    . CYSTOSCOPY/URETEROSCOPY/HOLMIUM LASER/STENT PLACEMENT Left 09/08/2017   Procedure: CYSTOSCOPY/URETEROSCOPY/HOLMIUM LASER/STENT EXCHANGE;  Surgeon: Hollice Espy, MD;  Location: [facility];  Service: Urology;  Laterality: Left;  . CYSTOSCOPY/URETEROSCOPY/HOLMIUM LASER/STENT PLACEMENT Left 12/14/2018   Procedure: CYSTOSCOPY/URETEROSCOPY/HOLMIUM LASER/STENT PLACEMENT;  Surgeon: Hollice Espy, MD;  Location: [facility];  Service: Urology;  Laterality: Left;  . FOOT SURGERY    . GASTRIC BYPASS    . IR NEPHROSTOMY PLACEMENT LEFT  08/17/2017  . KNEE ARTHROSCOPY Left   . KNEE ARTHROSCOPY Left 07/19/2015   Procedure: ARTHROSCOPY KNEE;  Surgeon: Leanor Kail, MD;  Location: [facility];  Service: Orthopedics;  Laterality: Left;  . NEPHROLITHOTOMY Left 08/17/2017   Procedure: NEPHROLITHOTOMY PERCUTANEOUS WITH HOLMIUM LASER;  Surgeon: Hollice Espy, MD;  Location: [facility];  Service: Urology;  Laterality: Left;  . THUMB ARTHROSCOPY    . TONSILLECTOMY      SOCIAL HISTORY:   Social History   Tobacco Use  . Smoking status: Former Smoker    Packs/day: 0.50    Years: 4.00    Pack years: 2.00  Types: Cigarettes    Last attempt to quit: 04/14/2015    Years since quitting: 3.7  . Smokeless tobacco: Never Used  Substance Use Topics  . Alcohol use: Yes    Comment: RARE    FAMILY HISTORY:   Family History  Problem Relation Age of Onset  . Cancer Mother        breast  . Heart disease Father   . Diabetes Father   . Hypertension Father   . Cancer Maternal Grandmother   . Cancer Maternal Grandfather   . Heart disease Paternal Grandmother   . Heart disease Paternal Grandfather   . Cancer  Maternal Aunt   . Cancer Maternal Uncle   . Heart disease Paternal Aunt   . Heart disease Paternal Uncle   . Kidney cancer Neg Hx   . Bladder Cancer Neg Hx     DRUG ALLERGIES:   Allergies  Allergen Reactions  . Sulfasalazine Hives  . 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 Sulfa Antibiotics Hives  . Penicillins Hives and Rash    Has patient had a PCN reaction causing immediate rash, facial/tongue/throat swelling, SOB or lightheadedness with hypotension: No Has patient had a PCN reaction causing severe rash involving mucus membranes or skin necrosis: No Has patient had a PCN reaction that required hospitalization: No Has patient had a PCN reaction occurring within the last 10 years: No If all of the above answers are "NO", then may proceed with Cephalosporin use.     REVIEW OF SYSTEMS:   ROS  -12 point review of system reviewed positive as per HPI otherwise negative  MEDICATIONS AT HOME:   Prior to Admission medications   Medication Sig Start Date End Date Taking? Authorizing Provider  atomoxetine (STRATTERA) 80 MG capsule Take 1 capsule (80 mg total) by mouth daily. Patient taking differently: Take 80 mg by mouth every morning.  10/17/18 01/15/19 Yes Cannady, Henrine Screws T, NP  atorvastatin (LIPITOR) 20 MG tablet Take 20 mg by mouth every morning.  06/27/18  Yes [provider]  conjugated estrogens (PREMARIN) vaginal cream Place 1 Applicatorful vaginally daily. Use pea sized amount M-W-Fr before bedtime 01/12/19  Yes Hollice Espy, MD  cyanocobalamin (,VITAMIN B-12,) 1000 MCG/ML injection Inject 1,000 mcg into the muscle every 7 (seven) days.  06/16/18  Yes [provider]  Dulaglutide (TRULICITY) 5.95 GL/8.7FI SOPN Inject 0.75 mg into the skin every Thursday.    Yes [provider]  escitalopram (LEXAPRO) 10 MG tablet Take 20 mg by mouth every morning.  09/13/18  Yes [provider]  gabapentin (NEURONTIN) 300 MG capsule Take 300 mg by  mouth at bedtime as needed (neuropathy).    Yes [provider]  levothyroxine (SYNTHROID, LEVOTHROID) 100 MCG tablet Take 100 mcg by mouth daily before breakfast.   Yes [provider]  pramipexole (MIRAPEX) 0.5 MG tablet Take 0.5 mg by mouth at bedtime. May take an additional 0.5 mg for restless legs   Yes [provider]  LINZESS 145 MCG CAPS capsule Take 1 capsule by mouth daily. 01/13/19   [provider]      VITAL SIGNS:  Blood pressure (!) 148/79, pulse 62, temperature 98.5 F (36.9 C), temperature source Oral, resp. rate 14, height 5\' 4"  (1.626 m), weight 78.9 kg, SpO2 99 %.  PHYSICAL EXAMINATION:  Physical Exam  GENERAL:  63 y.o.-year-old patient lying in the bed with no acute distress.  EYES: Pupils equal, round, reactive to light and  accommodation. No scleral icterus. Extraocular muscles intact.  HEENT: Head atraumatic, normocephalic. Oropharynx and nasopharynx clear.  NECK:  Supple, no jugular venous distention. No thyroid enlargement, no tenderness.  LUNGS: Normal breath sounds bilaterally, no wheezing, rales,rhonchi or crepitation. No use of accessory muscles of respiration.  CARDIOVASCULAR: S1, S2 normal. No murmurs, rubs, or gallops.  ABDOMEN: Soft, nontender, nondistended. Bowel sounds present. No organomegaly or mass.  EXTREMITIES: No pedal edema, cyanosis, or clubbing.  NEUROLOGIC: Cranial nerves II through XII are intact. Muscle strength 5/5 in all extremities. Sensation intact. Gait not checked.  PSYCHIATRIC: The patient is alert and oriented x 3.  SKIN: No obvious rash, lesion, or ulcer.   LABORATORY PANEL:   CBC Recent Labs  Lab 01/14/19 0219  WBC 11.7*  HGB 13.3  HCT 40.2  PLT 242   ------------------------------------------------------------------------------------------------------------------  Chemistries  Recent Labs  Lab 01/14/19 0219  NA 139  K 3.4*  CL 104  CO2 27  GLUCOSE 210*  BUN 20  CREATININE 0.85   CALCIUM 8.9  AST 30  ALT 41  ALKPHOS 98  BILITOT 0.6   ------------------------------------------------------------------------------------------------------------------  Cardiac Enzymes Recent Labs  Lab 01/14/19 0219  TROPONINI <0.03   ------------------------------------------------------------------------------------------------------------------  RADIOLOGY:  Dg Chest 2 View  Result Date: 01/14/2019 CLINICAL DATA:  Chest pain tonight EXAM: CHEST - 2 VIEW COMPARISON:  October 08, 2016 FINDINGS: The heart size and mediastinal contours are within normal limits. Both lungs are clear. The visualized skeletal structures are stable. IMPRESSION: No active cardiopulmonary disease. Electronically Signed   By: Abelardo Diesel M.D.   On: 01/14/2019 02:50   Ct Angio Chest Pe W/cm &/or Wo Cm  Result Date: 01/14/2019 CLINICAL DATA:  Chest pain and shortness of breath EXAM: CT ANGIOGRAPHY CHEST WITH CONTRAST TECHNIQUE: Multidetector CT imaging of the chest was performed using the standard protocol during bolus administration of intravenous contrast. Multiplanar CT image reconstructions and MIPs were obtained to evaluate the vascular anatomy. CONTRAST:  71mL OMNIPAQUE IOHEXOL 350 MG/ML SOLN COMPARISON:  Chest CT September 30, 2016 and chest radiograph January 14, 2019 FINDINGS: Cardiovascular: There is no demonstrable pulmonary embolus. There is no thoracic aortic aneurysm or appreciable dissection. The visualized great vessels appear unremarkable. There is no appreciable pericardial effusion or pericardial thickening. The main pulmonary outflow tract measures 3.4 cm in diameter, prominent. Mediastinum/Nodes: Thyroid appears unremarkable. No adenopathy is appreciable in the thoracic region. No esophageal lesions are evident. Lungs/Pleura: There is patchy atelectasis bilaterally. There is no evident edema or consolidation. No pleural effusion or pleural thickening is evident. Upper Abdomen: Patient is status  post gastric bypass procedure without complicating features in the visualized portions of the upper abdomen. Gallbladder is absent. Visualized upper abdominal structures otherwise appear unremarkable. Musculoskeletal: There is degenerative change in the thoracic spine. There are no blastic or lytic bone lesions. No evident chest wall lesions. Review of the MIP images confirms the above findings. IMPRESSION: 1. No demonstrable pulmonary embolus. No appreciable thoracic aortic aneurysm or dissection. 2. Prominence of the main pulmonary outflow tract, a finding felt to be indicative of a degree of pulmonary arterial hypertension. 3. Areas of patchy atelectasis bilaterally. No edema or consolidation. 4.  No appreciable thoracic adenopathy. 5. Status post gastric bypass procedure without complicating features noted in the visualized regions of the upper abdomen. 6.  Gallbladder absent. Electronically Signed   By: Lowella Grip III M.D.   On: 01/14/2019 04:35      IMPRESSION AND PLAN:   1.  Chest pain  with concern for unstable angina: Patient with risk factors.  Patient started on heparin drip in the emergency room.  Continue heparin drip.  Patient started on baby aspirin.  Continue Lipitor.  Cardiology consult requested.  Follow-up recommendation.  Will obtain echocardiogram.  Cycle cardiac enzymes.  Monitor on telemetry.  2.  Headache: Possibility of migraine.  Will rule out acute process with CT head as patient is on heparin drip.  3.  Chronic other medical problems: Monitor.  Continue home medication as ordered.  DVT prophylaxis: On heparin drip  Estimated length of stay more than 2 midnights  Further treatment based on clinical course and specialist evaluation.  All the records are reviewed and case discussed with ED provider. Management plans discussed with the patient, family and they are in agreement.  CODE STATUS: Full  TOTAL TIME TAKING CARE OF THIS PATIENT: 36 minutes.    Sedalia Muta M.D on 01/14/2019 at 7:08 AM  Between 7am to 6pm - Pager - [phone]  After 6pm go to [email or web address] - Proofreader  [facility]  Office  [phone]  CC: Primary care physician; Audelia Acton, MD

## 2019-01-14 NOTE — ED Notes (Signed)
Pt alert and oriented. NAD. Heparin gtt remains at 800 units/hr.  Waiting on admit bed.

## 2019-01-14 NOTE — ED Notes (Signed)
Pt to ct at this time.

## 2019-01-14 NOTE — ED Notes (Signed)
Cardiology at bedside.

## 2019-01-14 NOTE — ED Notes (Signed)
Patient transported to CT 

## 2019-01-14 NOTE — Consult Note (Signed)
[facility] Consultation Note  Patient ID: Courtney Murphy, MRN: [id number], DOB/AGE: [birth year] 64 y.o. Admit date: 01/14/2019   Date of Consult: 01/14/2019 Primary Physician: Audelia Acton, MD Primary Cardiologist: None  Chief Complaint:  Chief Complaint  Patient presents with  . Chest Pain   Reason for Consult: Headache and chest pain  HPI: 63 y.o. female with borderline hypertension not using medication management at this time due to history of some hypotension and side effects.  The patient does have hyperlipidemia on appropriate dose of atorvastatin and some family history of cardiovascular disease.  She has been relatively active in the last many months up until today's admission when she is not had any chest pain shortness of breath weakness fatigue dizziness or syncope.  The patient had new onset of headache for which caused some significant difficulty and then upper chest discomfort and arm discomfort.  When seen in the emergency room she had an EKG showing normal sinus rhythm compared with previous normal sinus rhythm EKG and normal EKG from last year.  Additionally she has had normal troponin normal chest x-ray and normal CAT scan with no evidence of cardiovascular calcifications or other changes on the CAT scan.  Since admission to the emergency room she is felt a whole lot better her headache is not completely gone but most of her symptoms have slowly gone away on their own.  Nitroglycerin early on with admission did not help any of her symptoms.  Currently she feels well and wishes to do discharged home for further treatment as an outpatient  Past Medical History:  Diagnosis Date  . Acid reflux 10/11/2015  . Anemia    RECEIVES IRON INFUSIONS  . Arthritis   . Asthma    WELL CONTROLLED  . Depression   . Diabetes mellitus without complication ([place])   . Fatty liver   . Gout   . History of kidney stones   . History of methicillin resistant staphylococcus  aureus (MRSA) 2016  . Hypertension    H/O BEEN OFF BP MEDS FOR 1 YEAR  . Hypothyroidism   . Kidney stone   . Knee pain    Left  . Pulmonary emboli ([place]) 2011  . Restless leg syndrome   . Shoulder pain       Surgical History:  Past Surgical History:  Procedure Laterality Date  . ABDOMINAL HYSTERECTOMY    . BACK SURGERY    . BREAST REDUCTION SURGERY    . CATARACT EXTRACTION W/PHACO Left 07/28/2017   Procedure: CATARACT EXTRACTION PHACO AND INTRAOCULAR LENS PLACEMENT ([place]) diabetic Left;  Surgeon: Leandrew Koyanagi, MD;  Location: [facility];  Service: Ophthalmology;  Laterality: Left;  Diabetic  . CESAREAN SECTION  1990  . CHOLECYSTECTOMY    . CYSTOSCOPY/URETEROSCOPY/HOLMIUM LASER/STENT PLACEMENT Left 09/08/2017   Procedure: CYSTOSCOPY/URETEROSCOPY/HOLMIUM LASER/STENT EXCHANGE;  Surgeon: Hollice Espy, MD;  Location: [facility];  Service: Urology;  Laterality: Left;  . CYSTOSCOPY/URETEROSCOPY/HOLMIUM LASER/STENT PLACEMENT Left 12/14/2018   Procedure: CYSTOSCOPY/URETEROSCOPY/HOLMIUM LASER/STENT PLACEMENT;  Surgeon: Hollice Espy, MD;  Location: [facility];  Service: Urology;  Laterality: Left;  . FOOT SURGERY    . GASTRIC BYPASS    . IR NEPHROSTOMY PLACEMENT LEFT  08/17/2017  . KNEE ARTHROSCOPY Left   . KNEE ARTHROSCOPY Left 07/19/2015   Procedure: ARTHROSCOPY KNEE;  Surgeon: Leanor Kail, MD;  Location: [facility];  Service: Orthopedics;  Laterality: Left;  . NEPHROLITHOTOMY Left 08/17/2017   Procedure: NEPHROLITHOTOMY PERCUTANEOUS WITH HOLMIUM LASER;  Surgeon: Hollice Espy, MD;  Location: [facility];  Service: Urology;  Laterality: Left;  . THUMB ARTHROSCOPY    . TONSILLECTOMY       Home Meds: Prior to Admission medications   Medication Sig Start Date End Date Taking? Authorizing Provider  atomoxetine (STRATTERA) 80 MG capsule Take 1 capsule (80 mg total) by mouth daily. Patient taking differently: Take 80 mg by mouth every morning.  10/17/18 01/15/19 Yes Cannady,  Henrine Screws T, NP  atorvastatin (LIPITOR) 20 MG tablet Take 20 mg by mouth every morning.  06/27/18  Yes [provider]  conjugated estrogens (PREMARIN) vaginal cream Place 1 Applicatorful vaginally daily. Use pea sized amount M-W-Fr before bedtime 01/12/19  Yes Hollice Espy, MD  cyanocobalamin (,VITAMIN B-12,) 1000 MCG/ML injection Inject 1,000 mcg into the muscle every 7 (seven) days.  06/16/18  Yes [provider]  Dulaglutide (TRULICITY) 3.23 FT/7.3UK SOPN Inject 0.75 mg into the skin every Thursday.    Yes [provider]  escitalopram (LEXAPRO) 10 MG tablet Take 20 mg by mouth every morning.  09/13/18  Yes [provider]  gabapentin (NEURONTIN) 300 MG capsule Take 300 mg by mouth at bedtime as needed (neuropathy).    Yes [provider]  levothyroxine (SYNTHROID, LEVOTHROID) 100 MCG tablet Take 100 mcg by mouth daily before breakfast.   Yes [provider]  pramipexole (MIRAPEX) 0.5 MG tablet Take 0.5 mg by mouth at bedtime. May take an additional 0.5 mg for restless legs   Yes [provider]  LINZESS 145 MCG CAPS capsule Take 1 capsule by mouth daily. 01/13/19   [provider]    Inpatient Medications:  . aspirin EC  81 mg Oral Daily  . atomoxetine  80 mg Oral BH-q7a  . atorvastatin  20 mg Oral BH-q7a  . escitalopram  20 mg Oral BH-q7a  . insulin aspart  0-5 Units Subcutaneous QHS  . insulin aspart  0-9 Units Subcutaneous TID WC  . levothyroxine  100 mcg Oral QAC breakfast  . linaclotide  145 mcg Oral Daily  . metoprolol tartrate  12.5 mg Oral BID  . nitroGLYCERIN  0.5 inch Topical Once  . pramipexole  0.5 mg Oral QHS  . sodium chloride flush  3 mL Intravenous Once   . heparin 800 Units/hr (01/14/19 0254)    Allergies:  Allergies  Allergen Reactions  . Sulfasalazine Hives  . 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 Sulfa Antibiotics Hives  . Penicillins Hives and Rash    Has patient had a PCN  reaction causing immediate rash, facial/tongue/throat swelling, SOB or lightheadedness with hypotension: No Has patient had a PCN reaction causing severe rash involving mucus membranes or skin necrosis: No Has patient had a PCN reaction that required hospitalization: No Has patient had a PCN reaction occurring within the last 10 years: No If all of the above answers are "NO", then may proceed with Cephalosporin use.     Social History   Socioeconomic History  . Marital status: Widowed    Spouse name: Not on file  . Number of children: Not on file  . Years of education: Not on file  . Highest education level: Not on file  Occupational History  . Not on file  Social Needs  . Financial resource strain: Not on file  . Food insecurity:    Worry: Not on file    Inability: Not on file  . Transportation needs:    Medical: Not on file    Non-medical:  Not on file  Tobacco Use  . Smoking status: Former Smoker    Packs/day: 0.50    Years: 4.00    Pack years: 2.00    Types: Cigarettes    Last attempt to quit: 04/14/2015    Years since quitting: 3.7  . Smokeless tobacco: Never Used  Substance and Sexual Activity  . Alcohol use: Yes    Comment: RARE  . Drug use: No  . Sexual activity: Never  Lifestyle  . Physical activity:    Days per week: Not on file    Minutes per session: Not on file  . Stress: Not on file  Relationships  . Social connections:    Talks on phone: Not on file    Gets together: Not on file    Attends religious service: Not on file    Active member of club or organization: Not on file    Attends meetings of clubs or organizations: Not on file    Relationship status: Not on file  . Intimate partner violence:    Fear of current or ex partner: Not on file    Emotionally abused: Not on file    Physically abused: Not on file    Forced sexual activity: Not on file  Other Topics Concern  . Not on file  Social History Narrative  . Not on file     Family  History  Problem Relation Age of Onset  . Cancer Mother        breast  . Heart disease Father   . Diabetes Father   . Hypertension Father   . Cancer Maternal Grandmother   . Cancer Maternal Grandfather   . Heart disease Paternal Grandmother   . Heart disease Paternal Grandfather   . Cancer Maternal Aunt   . Cancer Maternal Uncle   . Heart disease Paternal Aunt   . Heart disease Paternal Uncle   . Kidney cancer Neg Hx   . Bladder Cancer Neg Hx      Review of Systems Positive for headache chest pain Negative for: General:  chills, fever, night sweats or weight changes.  Cardiovascular: PND orthopnea syncope dizziness  Dermatological skin lesions rashes Respiratory: Cough congestion Urologic: Frequent urination urination at night and hematuria Abdominal: negative for nausea, vomiting, diarrhea, bright red blood per rectum, melena, or hematemesis Neurologic: negative for visual changes, and/or hearing changes  All other systems reviewed and are otherwise negative except as noted above.  Labs: Recent Labs    01/14/19 0219 01/14/19 0806  TROPONINI <0.03 <0.03   Lab Results  Component Value Date   WBC 11.7 (H) 01/14/2019   HGB 13.3 01/14/2019   HCT 40.2 01/14/2019   MCV 92.4 01/14/2019   PLT 242 01/14/2019    Recent Labs  Lab 01/14/19 0219  NA 139  K 3.4*  CL 104  CO2 27  BUN 20  CREATININE 0.85  CALCIUM 8.9  PROT 6.3*  BILITOT 0.6  ALKPHOS 98  ALT 41  AST 30  GLUCOSE 210*   No results found for: CHOL, HDL, LDLCALC, TRIG No results found for: DDIMER  Radiology/Studies:  Dg Chest 2 View  Result Date: 01/14/2019 CLINICAL DATA:  Chest pain tonight EXAM: CHEST - 2 VIEW COMPARISON:  October 08, 2016 FINDINGS: The heart size and mediastinal contours are within normal limits. Both lungs are clear. The visualized skeletal structures are stable. IMPRESSION: No active cardiopulmonary disease. Electronically Signed   By: Abelardo Diesel M.D.   On: 01/14/2019 02:50  Ct Head Wo Contrast  Result Date: 01/14/2019 CLINICAL DATA:  Headache EXAM: CT HEAD WITHOUT CONTRAST TECHNIQUE: Contiguous axial images were obtained from the base of the skull through the vertex without intravenous contrast. Note that the patient received intravenous contrast earlier in the day for CT angiogram chest. COMPARISON:  November 01, 2017 FINDINGS: Brain: The ventricles and sulci appear within normal limits. There is no intracranial mass, hemorrhage, extra-axial fluid collection, or midline shift. There is slight small vessel disease in the centra semiovale bilaterally. Brain parenchyma elsewhere appears unremarkable. No evident acute infarct. Vascular: No hyperdense vessel. No appreciable vascular calcifications are evident. Skull: The bony calvarium appears intact. Sinuses/Orbits: Visualized paranasal sinuses are clear. Visualized orbits appear symmetric bilaterally. Other: Visualized mastoid air cells are clear. IMPRESSION: Mild periventricular small vessel disease, stable. No acute infarct evident. No mass or hemorrhage. Electronically Signed   By: Lowella Grip III M.D.   On: 01/14/2019 07:35   Ct Angio Chest Pe W/cm &/or Wo Cm  Result Date: 01/14/2019 CLINICAL DATA:  Chest pain and shortness of breath EXAM: CT ANGIOGRAPHY CHEST WITH CONTRAST TECHNIQUE: Multidetector CT imaging of the chest was performed using the standard protocol during bolus administration of intravenous contrast. Multiplanar CT image reconstructions and MIPs were obtained to evaluate the vascular anatomy. CONTRAST:  40mL OMNIPAQUE IOHEXOL 350 MG/ML SOLN COMPARISON:  Chest CT September 30, 2016 and chest radiograph January 14, 2019 FINDINGS: Cardiovascular: There is no demonstrable pulmonary embolus. There is no thoracic aortic aneurysm or appreciable dissection. The visualized great vessels appear unremarkable. There is no appreciable pericardial effusion or pericardial thickening. The main pulmonary outflow tract  measures 3.4 cm in diameter, prominent. Mediastinum/Nodes: Thyroid appears unremarkable. No adenopathy is appreciable in the thoracic region. No esophageal lesions are evident. Lungs/Pleura: There is patchy atelectasis bilaterally. There is no evident edema or consolidation. No pleural effusion or pleural thickening is evident. Upper Abdomen: Patient is status post gastric bypass procedure without complicating features in the visualized portions of the upper abdomen. Gallbladder is absent. Visualized upper abdominal structures otherwise appear unremarkable. Musculoskeletal: There is degenerative change in the thoracic spine. There are no blastic or lytic bone lesions. No evident chest wall lesions. Review of the MIP images confirms the above findings. IMPRESSION: 1. No demonstrable pulmonary embolus. No appreciable thoracic aortic aneurysm or dissection. 2. Prominence of the main pulmonary outflow tract, a finding felt to be indicative of a degree of pulmonary arterial hypertension. 3. Areas of patchy atelectasis bilaterally. No edema or consolidation. 4.  No appreciable thoracic adenopathy. 5. Status post gastric bypass procedure without complicating features noted in the visualized regions of the upper abdomen. 6.  Gallbladder absent. Electronically Signed   By: Lowella Grip III M.D.   On: 01/14/2019 04:35   US Renal  Result Date: 01/11/2019 CLINICAL DATA:  Left nephrolithiasis. EXAM: RENAL / URINARY TRACT ULTRASOUND COMPLETE COMPARISON:  CT scan 08/31/2017 FINDINGS: Right Kidney: Renal measurements: 12.0 x 5.5 x 5.0 cm = volume: 173 mL. 1.6 cm probable central sinus cyst. No hydronephrosis. Left Kidney: Renal measurements: 10.8 x 6.9 x 5.4 cm = volume: 210 mL. Echogenicity within normal limits. No mass or hydronephrosis visualized. Several tiny hyperechoic foci suggest stones. Bladder: Echogenic shadowing structure in the right UVJ suggests stone. A right ureteral jet is visible on color Doppler  evaluation. IMPRESSION: 1. No hydronephrosis. 2. Left nephrolithiasis. 3. Probable 9 mm right UVJ stone. Color Doppler evaluation shows a right ureteral jet around the stone. Electronically Signed  By: Misty Stanley M.D.   On: 01/11/2019 09:50   Dg Foot Complete Right  Result Date: 01/11/2019 Please see detailed radiograph report in office note.   EKG: Normal sinus rhythm otherwise normal EKG  Weights: Filed Weights   01/14/19 0152  Weight: 78.9 kg     Physical Exam: Blood pressure (!) 178/91, pulse 64, temperature 98.5 F (36.9 C), temperature source Oral, resp. rate (!) 22, height 5\' 4"  (1.626 m), weight 78.9 kg, SpO2 99 %. Body mass index is 29.86 kg/m. General: Well developed, well nourished, in no acute distress. Head eyes ears nose throat: Normocephalic, atraumatic, sclera non-icteric, no xanthomas, nares are without discharge. No apparent thyromegaly and/or mass  Lungs: Normal respiratory effort.  no wheezes, no rales, no rhonchi.  Heart: RRR with normal S1 S2. no murmur gallop, no rub, PMI is normal size and placement, carotid upstroke normal without bruit, jugular venous pressure is normal Abdomen: Soft, non-tender, non-distended with normoactive bowel sounds. No hepatomegaly. No rebound/guarding. No obvious abdominal masses. Abdominal aorta is normal size without bruit Extremities: No edema. no cyanosis, no clubbing, no ulcers  Peripheral : 2+ bilateral upper extremity pulses, 2+ bilateral femoral pulses, 2+ bilateral dorsal pedal pulse Neuro: Alert and oriented. No facial asymmetry. No focal deficit. Moves all extremities spontaneously. Musculoskeletal: Normal muscle tone without kyphosis Psych:  Responds to questions appropriately with a normal affect.    Assessment: 63 year old female with borderline hypertension and hyperlipidemia with some family history of cardiovascular disease having headache and atypical chest discomfort relieved spontaneously not due to nitrates  or other intervention with currently no evidence of congestive heart failure or myocardial infarction  Plan: 1.  Discontinuation of heparin and begin ambulation and follow for any significant symptoms that may warrant further investigation or admission 2.  Continue high intensity cholesterol therapy 3.  Aspirin for further risk reduction of potential cardiovascular risk 4.  If ambulating well with no further significant symptoms okay for discharge home from the cardiac standpoint with follow-up next week for further evaluation of headache and atypical chest pain as necessary  Signed, Corey Skains M.D. [facility] 01/14/2019, 9:23 AM

## 2019-02-08 ENCOUNTER — Ambulatory Visit: Payer: BC Managed Care – PPO | Admitting: Podiatry

## 2019-02-08 ENCOUNTER — Encounter: Payer: Self-pay | Admitting: Podiatry

## 2019-02-08 ENCOUNTER — Other Ambulatory Visit: Payer: Self-pay

## 2019-02-08 DIAGNOSIS — G5761 Lesion of plantar nerve, right lower limb: Secondary | ICD-10-CM

## 2019-02-08 DIAGNOSIS — M722 Plantar fascial fibromatosis: Secondary | ICD-10-CM | POA: Diagnosis not present

## 2019-02-08 DIAGNOSIS — M778 Other enthesopathies, not elsewhere classified: Secondary | ICD-10-CM

## 2019-02-08 DIAGNOSIS — M779 Enthesopathy, unspecified: Secondary | ICD-10-CM

## 2019-02-08 DIAGNOSIS — G5781 Other specified mononeuropathies of right lower limb: Secondary | ICD-10-CM

## 2019-02-08 NOTE — Progress Notes (Signed)
She presents today before leaving for [place] and [place].  She is following up with pain that presents in nighttime along the medial longitudinal arch shooting and fleeting in nature though she does have some tenderness right here as she points to the medial aspect of the medial longitudinal arch on the right foot.  Objective: Vital signs are stable she is alert and oriented x3.  Pulses are palpable.  She has little in the way of reproducible pain with exception of the medial longitudinal arch of the a right foot.  Assessment: She has hammertoe deformity capsulitis metatarsalgia and plantar fasciitis.  And neuropathy.  Plan: At this point I am going to get her scanned for new set of orthotics and I am going to request that she start taking gabapentin 300 mg which she already has at home nightly to help alleviate those neuropathic symptoms.  States that her blood sugar is a 7.0 A1c

## 2019-02-09 IMAGING — US US RENAL
1 series · 14 of 25 positions shown · non-contrast
Comparison: 08/31/2017 CT.

CLINICAL DATA: 61-year-old female with left kidney stone surgery
with placement of stent which was removed 09/15/2017. Subsequent
encounter.

EXAM:
RENAL / URINARY TRACT ULTRASOUND COMPLETE

[Series 1: us renal · 0.25mm/px · 14 of 33 slices shown]
[im 1/33]
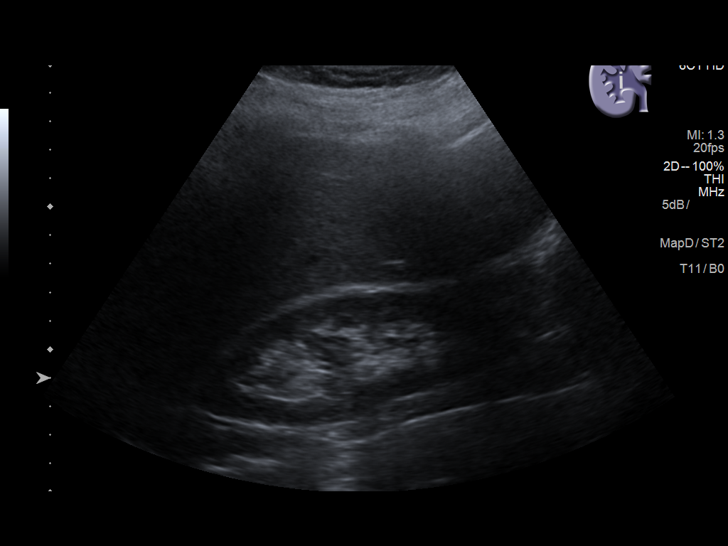
[im 3/33]
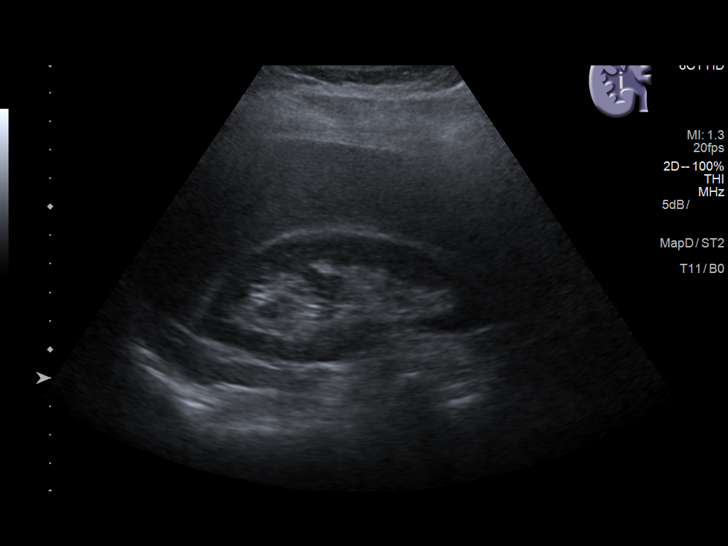
[im 6/33]
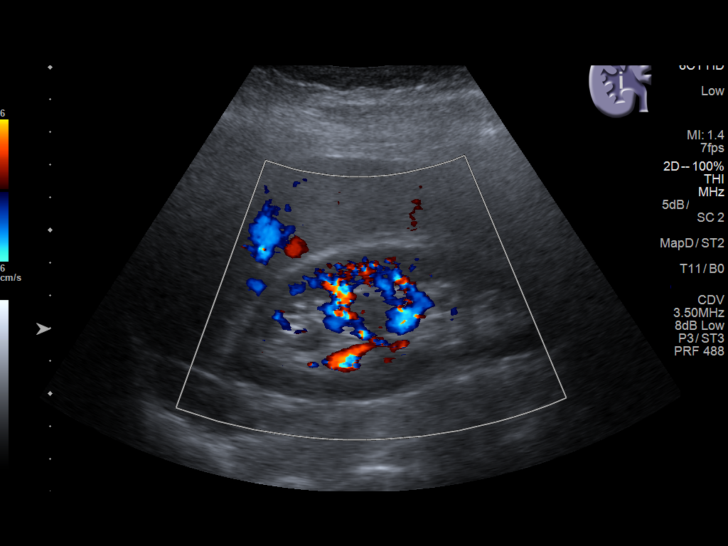
[im 9/33]
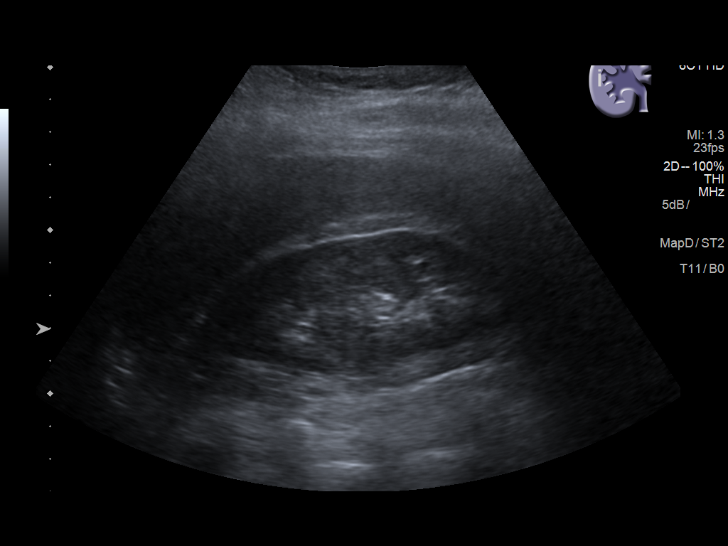
[im 11/33]
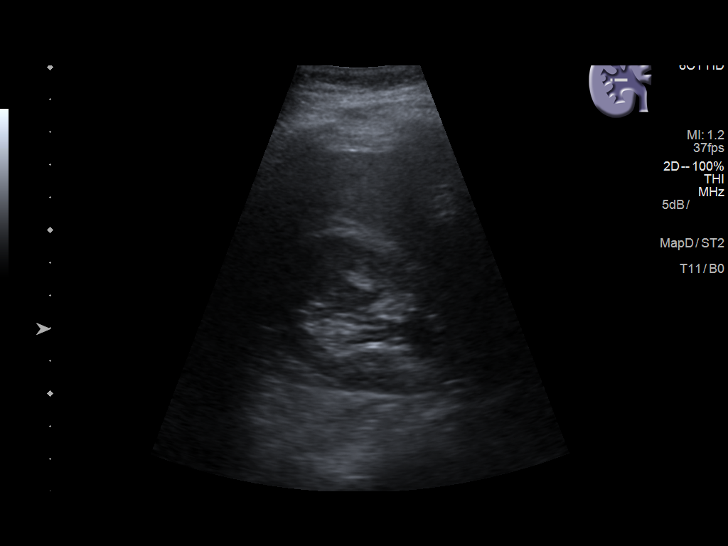
[im 13/33]
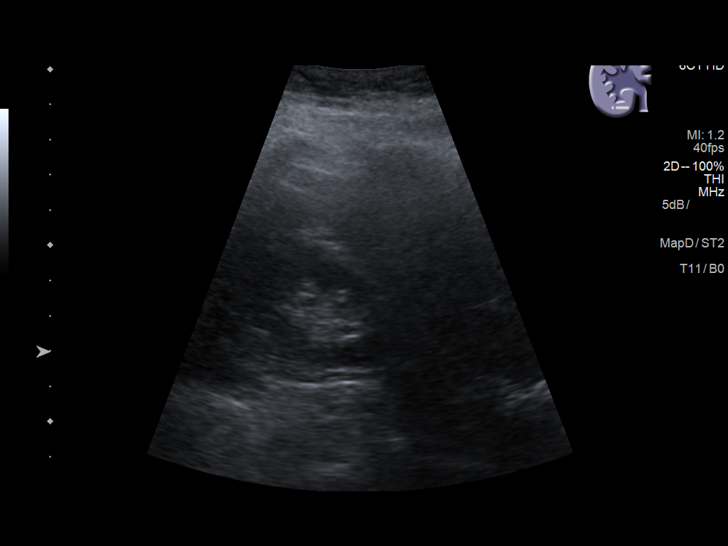
[im 15/33]
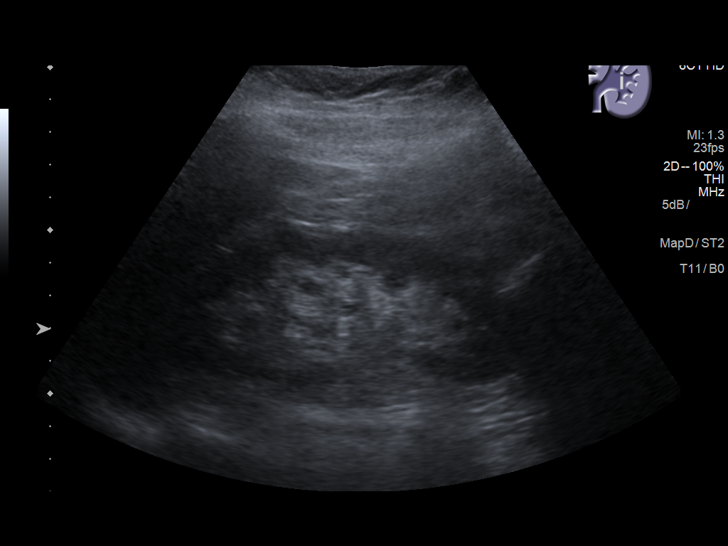
[im 18/33]
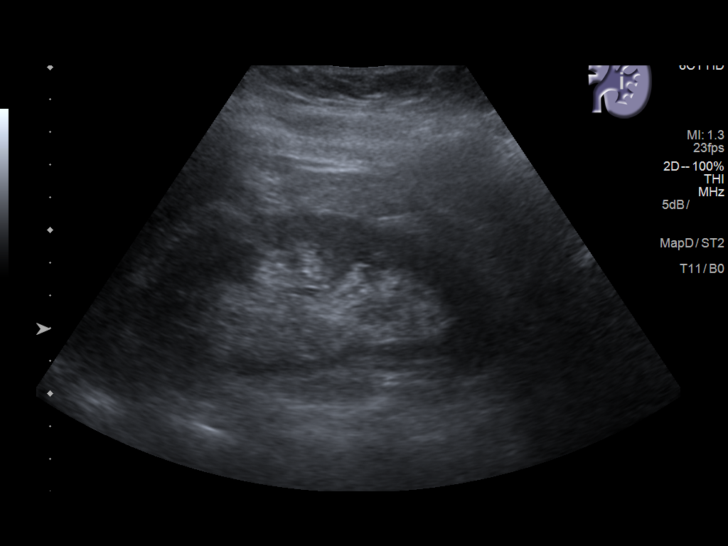
[im 21/33]
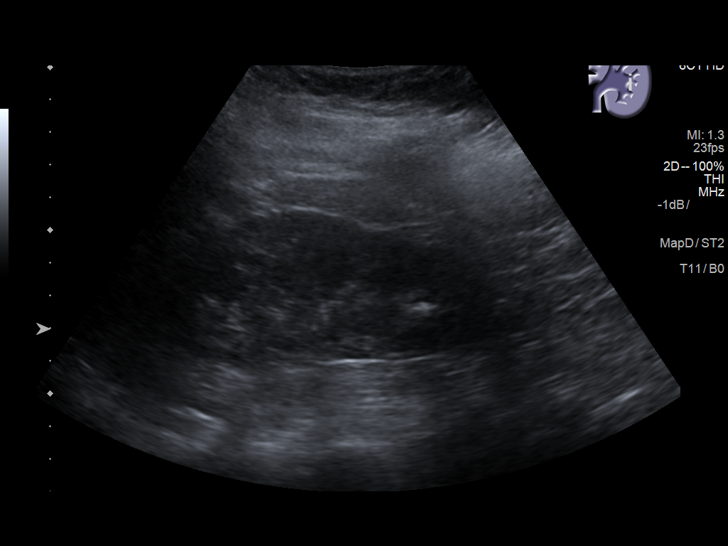
[im 22/33]
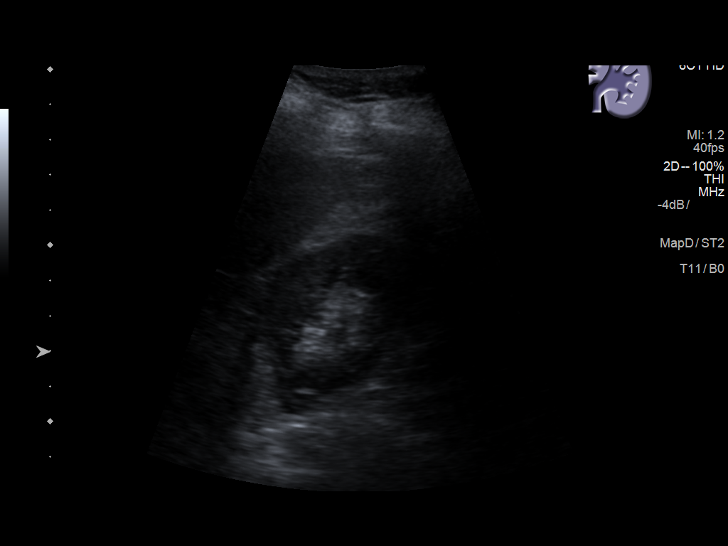
[im 25/33]
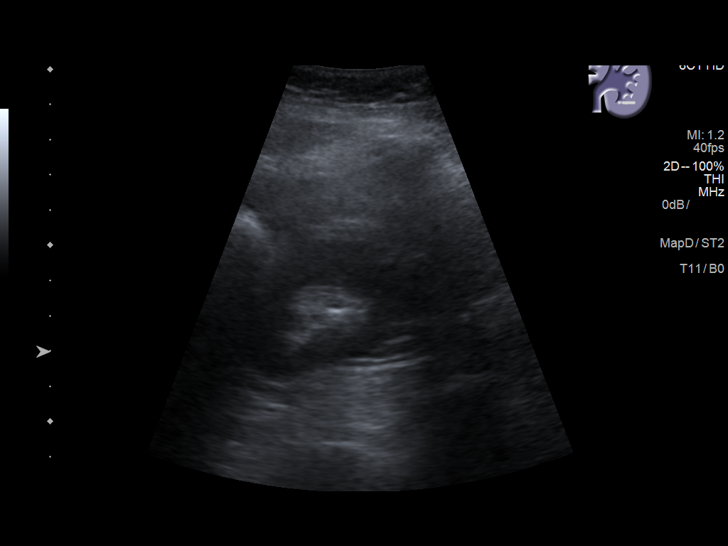
[im 27/33]
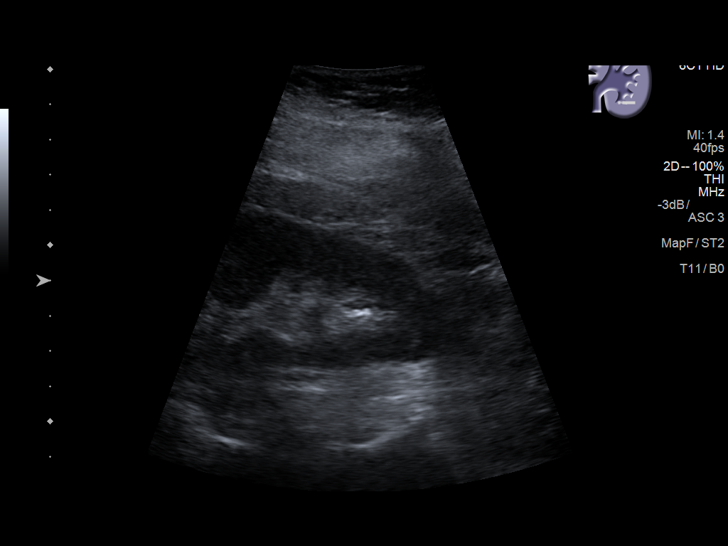
[im 30/33]
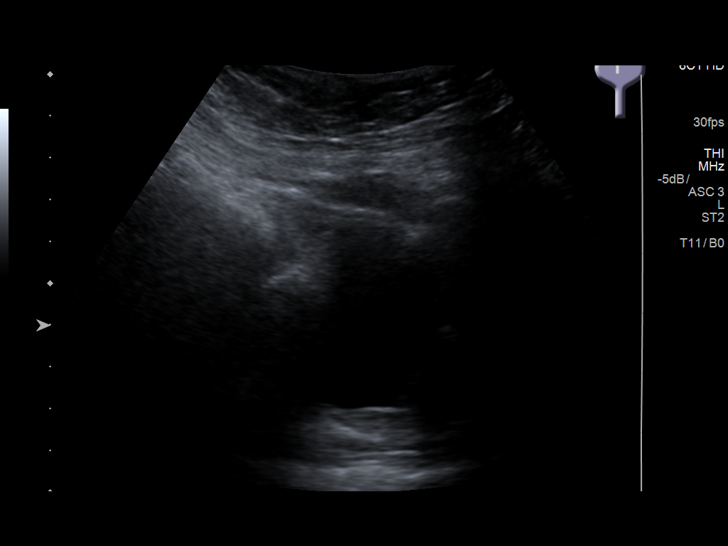
[im 33/33]
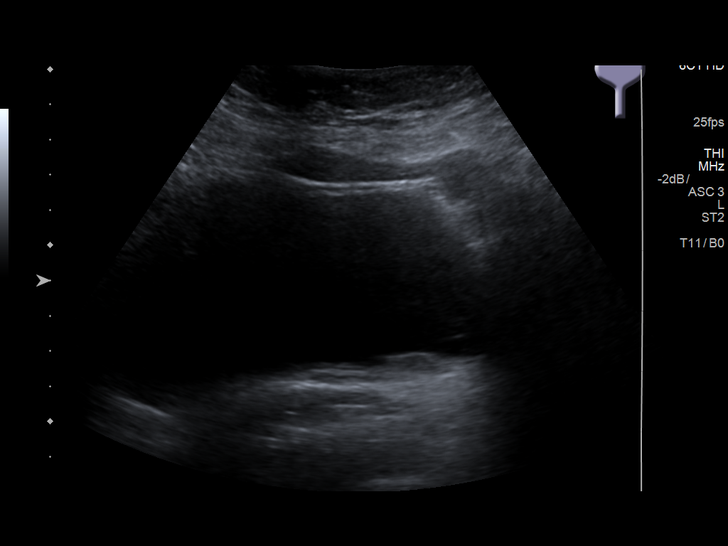

[14 of 25 positions shown; findings below may reference images not displayed]

FINDINGS: Right Kidney:

Length: 10.5 cm. Echogenicity within normal limits. No mass or
hydronephrosis visualized.

Left Kidney:

Length: 10.9 cm. Echogenicity within normal limits. No
hydronephrosis visualized. Lower pole 5.7 mm nonobstructing stone.

Bladder:

Appears normal for degree of bladder distention.
IMPRESSION: Left lower pole 5.7 mm nonobstructing stone.  No hydronephrosis.

## 2019-02-10 ENCOUNTER — Other Ambulatory Visit: Payer: Self-pay

## 2019-02-10 ENCOUNTER — Ambulatory Visit
Admission: RE | Admit: 2019-02-10 | Discharge: 2019-02-10 | Disposition: A | Discharge status: Home / Self Care | Payer: BC Managed Care – PPO | Source: Ambulatory Visit | Attending: Urology | Admitting: Urology

## 2019-02-10 DIAGNOSIS — N200 Calculus of kidney: Secondary | ICD-10-CM | POA: Diagnosis present

## 2019-02-13 ENCOUNTER — Telehealth: Payer: Self-pay

## 2019-02-13 NOTE — Telephone Encounter (Signed)
Called pt informed her of the information below. Pt gave verbal understanding. KUB ordered. Appt scheduled.

## 2019-02-13 NOTE — Telephone Encounter (Signed)
-----   Message from Hollice Espy, MD sent at 02/13/2019  2:16 PM EDT ----- Doristine Devoid news, it does not appear that there is any stones on the right side or at the right UVJ as indicated on previous ultrasound.  Again, I think this was likely debris which is now gone.  I would like her to follow-up in 9 months with KUB.  It does not look like this is scheduled.  Please ensure that this is arranged.  Hollice Espy, MD

## 2019-02-28 ENCOUNTER — Ambulatory Visit (INDEPENDENT_AMBULATORY_CARE_PROVIDER_SITE_OTHER): Payer: BC Managed Care – PPO

## 2019-02-28 ENCOUNTER — Other Ambulatory Visit: Payer: Self-pay

## 2019-02-28 ENCOUNTER — Telehealth: Payer: Self-pay | Admitting: *Deleted

## 2019-02-28 ENCOUNTER — Encounter: Payer: Self-pay | Admitting: Podiatry

## 2019-02-28 ENCOUNTER — Ambulatory Visit: Payer: BC Managed Care – PPO | Admitting: Podiatry

## 2019-02-28 ENCOUNTER — Ambulatory Visit: Payer: BC Managed Care – PPO

## 2019-02-28 VITALS — Temp 99.4°F

## 2019-02-28 DIAGNOSIS — R609 Edema, unspecified: Secondary | ICD-10-CM | POA: Diagnosis not present

## 2019-02-28 DIAGNOSIS — M7989 Other specified soft tissue disorders: Secondary | ICD-10-CM

## 2019-02-28 DIAGNOSIS — M779 Enthesopathy, unspecified: Secondary | ICD-10-CM

## 2019-02-28 DIAGNOSIS — M19071 Primary osteoarthritis, right ankle and foot: Secondary | ICD-10-CM

## 2019-02-28 DIAGNOSIS — S96912S Strain of unspecified muscle and tendon at ankle and foot level, left foot, sequela: Secondary | ICD-10-CM

## 2019-02-28 MED ORDER — HYDROCODONE-ACETAMINOPHEN 5-325 MG PO TABS: 1.0000 | ORAL_TABLET | Freq: Four times a day (QID) | ORAL | 0 refills | Status: DC | PRN

## 2019-02-28 MED ORDER — DICLOFENAC SODIUM 1 % TD GEL: 2.0000 g | Freq: Four times a day (QID) | TRANSDERMAL | 2 refills | Status: DC

## 2019-02-28 MED ORDER — HYDROCODONE-ACETAMINOPHEN 5-325 MG PO TABS: 1.0000 | ORAL_TABLET | Freq: Four times a day (QID) | ORAL | 0 refills | Status: AC | PRN

## 2019-02-28 NOTE — Progress Notes (Signed)
Patient came in today to pick up custom made foot orthotics.  The goals were accomplished and the patient reported no dissatisfaction with said orthotics.  Patient was advised of breakin period and how to report any issues. 

## 2019-02-28 NOTE — Telephone Encounter (Signed)
I informed pt, the Voltaren gel needs a PA and if [facility] will fax to [phone], I would give to the PA agent to process.

## 2019-02-28 NOTE — Progress Notes (Signed)
Subjective: 63 year old female presents the office today for concerns of left foot pain and swelling.  She states this flare started last week and she states the swelling gets worse throughout the day.  She is getting more pain to the ankle and the top of the foot.  She denies any recent injury or trauma.  She says it hurts all the way to her knee.  The gabapentin that Dr. I prescribed was appointment was not helpful.  Her orthotics are not back yet. Denies any systemic complaints such as fevers, chills, nausea, vomiting. No acute changes since last appointment, and no other complaints at this time.   Objective: AAO x3, NAD DP/PT pulses palpable bilaterally, CRT less than 3 seconds The is mild to moderate edema on the left foot compared to the right foot. There is no overlying erythema or increase in warmth. There is tenderness to palpation to the dorsal aspect of the midfoot.  There is some discomfort along the course of the extensor tendons of the dorsal aspect the foot.  There is mild to moderate edema to left lower leg control extremity.  There is no erythema or increased warmth.  There is some discomfort at the ankle joint itself.  Minimal discomfort to the metatarsal heads 2 and 3. No open lesions or pre-ulcerative lesions.  No pain with calf compression, swelling, warmth, erythema  Assessment: Chronic left foot pain; arthritis, rule out partial extensor tendon tear  Plan: -All treatment options discussed with the patient including all alternatives, risks, complications.  -X-rays were obtained reviewed the left foot and ankle.  Arthritic changes present the midfoot.  No definitive evidence of acute fracture identified today. -Given her chronic foot pain we will order an MRI to be performed at [facility].  She is had chronic foot pain multiple treatments including injections, immobilization in a cam boot without any significant improvement. -She has a CAM boot at home and  recommed her to go into this. -Voltaren gel ordered -She is asking for pain medication. She did not want tramadol she states it does not help. Prescribed Vicodin.   RTC 3 weeks with Dr. Milinda Pointer in [facility] DPM

## 2019-02-28 NOTE — Telephone Encounter (Signed)
Orders to Gretta Arab, RN for pre-cert, faxed to [facility].

## 2019-02-28 NOTE — Telephone Encounter (Signed)
Pt states she was seen today by Dr. Jacqualyn Posey and the pain medication was received at the pharmacy, the the voltaren gel was not on her [facility] list.

## 2019-02-28 NOTE — Telephone Encounter (Signed)
-----   Message from Trula Slade, DPM sent at 02/28/2019  8:37 AM EDT ----- Can you please order an MRI of the left foot/ankle to rule out partial extensor tendon tear? She has chronic foot pain without any relief. She would like this done at [facility]. Thanks .

## 2019-03-01 ENCOUNTER — Ambulatory Visit: Payer: BC Managed Care – PPO | Admitting: Podiatry

## 2019-03-02 ENCOUNTER — Other Ambulatory Visit: Payer: Self-pay | Admitting: Podiatry

## 2019-03-02 DIAGNOSIS — M7989 Other specified soft tissue disorders: Secondary | ICD-10-CM

## 2019-03-02 DIAGNOSIS — M779 Enthesopathy, unspecified: Secondary | ICD-10-CM

## 2019-03-02 DIAGNOSIS — S96912S Strain of unspecified muscle and tendon at ankle and foot level, left foot, sequela: Secondary | ICD-10-CM

## 2019-03-02 DIAGNOSIS — M19071 Primary osteoarthritis, right ankle and foot: Secondary | ICD-10-CM

## 2019-03-09 ENCOUNTER — Other Ambulatory Visit: Payer: Self-pay

## 2019-03-09 ENCOUNTER — Encounter: Payer: Self-pay | Admitting: Podiatry

## 2019-03-09 ENCOUNTER — Ambulatory Visit (INDEPENDENT_AMBULATORY_CARE_PROVIDER_SITE_OTHER): Payer: BC Managed Care – PPO | Admitting: Podiatry

## 2019-03-09 VITALS — Temp 97.4°F

## 2019-03-09 DIAGNOSIS — M779 Enthesopathy, unspecified: Principal | ICD-10-CM

## 2019-03-09 DIAGNOSIS — S96912S Strain of unspecified muscle and tendon at ankle and foot level, left foot, sequela: Secondary | ICD-10-CM

## 2019-03-09 DIAGNOSIS — M7752 Other enthesopathy of left foot: Secondary | ICD-10-CM

## 2019-03-09 DIAGNOSIS — M778 Other enthesopathies, not elsewhere classified: Secondary | ICD-10-CM

## 2019-03-09 MED ORDER — METHYLPREDNISOLONE 4 MG PO TBPK: ORAL_TABLET | ORAL | 0 refills | Status: DC

## 2019-03-09 MED ORDER — HYDROCODONE-ACETAMINOPHEN 10-325 MG PO TABS: 1.0000 | ORAL_TABLET | Freq: Three times a day (TID) | ORAL | 0 refills | Status: DC | PRN

## 2019-03-09 NOTE — Progress Notes (Signed)
She presents today for follow-up of severe pain to the dorsal aspect of her left foot.  Objective: Vital signs are stable she is alert oriented x3 severe osteoarthritic changes dorsal aspect of the left foot with pain on palpation to the deep peroneal nerve and intermediate dorsal cutaneous nerve as well as medial dorsal cutaneous nerve.  There appears to be fluid overlying the osseous deformity and the tarsometatarsal joints.  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  Plan: After sterile Betadine skin prep I injected 20 mg Kenalog 5 mg Marcaine point of maximal tenderness of the left dorsum of the foot.  She tolerated procedure well without complications.  Provided her with a prescription for Vicodin.  She will follow-up with [place] after the MRI to the dorsal aspect of the left foot.

## 2019-03-20 ENCOUNTER — Other Ambulatory Visit: Payer: Self-pay

## 2019-03-20 ENCOUNTER — Ambulatory Visit
Admission: RE | Admit: 2019-03-20 | Discharge: 2019-03-20 | Disposition: A | Discharge status: Home / Self Care | Payer: BC Managed Care – PPO | Source: Ambulatory Visit | Attending: Podiatry | Admitting: Podiatry

## 2019-03-20 DIAGNOSIS — M19071 Primary osteoarthritis, right ankle and foot: Secondary | ICD-10-CM | POA: Insufficient documentation

## 2019-03-20 DIAGNOSIS — M7989 Other specified soft tissue disorders: Secondary | ICD-10-CM | POA: Insufficient documentation

## 2019-03-20 DIAGNOSIS — S96912S Strain of unspecified muscle and tendon at ankle and foot level, left foot, sequela: Secondary | ICD-10-CM | POA: Insufficient documentation

## 2019-03-20 DIAGNOSIS — M779 Enthesopathy, unspecified: Secondary | ICD-10-CM | POA: Diagnosis not present

## 2019-03-22 ENCOUNTER — Encounter: Payer: Self-pay | Admitting: Podiatry

## 2019-03-22 ENCOUNTER — Ambulatory Visit (INDEPENDENT_AMBULATORY_CARE_PROVIDER_SITE_OTHER): Payer: BC Managed Care – PPO | Admitting: Podiatry

## 2019-03-22 ENCOUNTER — Other Ambulatory Visit: Payer: Self-pay

## 2019-03-22 DIAGNOSIS — S96912S Strain of unspecified muscle and tendon at ankle and foot level, left foot, sequela: Secondary | ICD-10-CM | POA: Diagnosis not present

## 2019-03-22 NOTE — Progress Notes (Signed)
Patient came by the office this morning to pick up a new boot.

## 2019-03-27 ENCOUNTER — Telehealth: Payer: Self-pay | Admitting: *Deleted

## 2019-03-27 ENCOUNTER — Encounter: Payer: Self-pay | Admitting: Podiatry

## 2019-03-27 NOTE — Progress Notes (Signed)
The day I received the MRI results I called the patient to review and spoke with her. Results sent to Dr. Milinda Pointer.  She did state that her foot was feeling better than when she first came in but still having pain. Recommended to go back into the CAM boot, ice and follow up with Dr. Milinda Pointer.   (original documentation not visible in epic as I sent it through a staff message to Dr. Milinda Pointer).   Trula Slade

## 2019-03-27 NOTE — Telephone Encounter (Signed)
Pt called states she is still in considerable pain and wanted to know what was to be done next.

## 2019-03-27 NOTE — Telephone Encounter (Signed)
Please send this latest MRI for over read and inform patient. Also she should continue to wear the boot and should be NON wgt brg with crutches or scooter.

## 2019-03-27 NOTE — Telephone Encounter (Signed)
I informed pt of Dr. Stephenie Acres review of results and request to send copy of MRI Disc to a radiology specialist for more details for treatment planning and there would be a 10 - 14 day delay of final results and once received we would call with instruction. I asked pt if she had crutches or knee scooter and she stated she could get a knee scooter.

## 2019-03-27 NOTE — Telephone Encounter (Signed)
Faxed request for MRI disc to [facility].

## 2019-03-30 ENCOUNTER — Ambulatory Visit: Payer: BC Managed Care – PPO

## 2019-04-03 NOTE — Telephone Encounter (Signed)
Received copy of MRI disc from [facility] and mailed to [facility].

## 2019-04-05 ENCOUNTER — Ambulatory Visit: Payer: BC Managed Care – PPO | Admitting: Podiatry

## 2019-04-10 ENCOUNTER — Telehealth: Payer: Self-pay | Admitting: Podiatry

## 2019-04-10 NOTE — Telephone Encounter (Signed)
[facility] - Courtney Murphy states radiologist would like the original left ankle/heel MRI of 04/14/2018 disc copy for comparison. Faxed request for copy of MRI 04/14/2018 disc to [facility] ([facility]

## 2019-04-10 NOTE — Telephone Encounter (Signed)
CALLING FOR PREVIOUS MRI OF LEFT FOOT/ ANKLE

## 2019-04-10 NOTE — Telephone Encounter (Signed)
I informed Courtney Murphy of The request for 04/14/2018 MRI copy disc by the radiology group and Courtney Murphy states she had cancelled the appt with Dr. Milinda Pointer for Wednesday already.

## 2019-04-10 NOTE — Telephone Encounter (Signed)
Pt calling to follow up on MRI. The MRI was sent out for a second opinion and patient is calling to see if any results have come back from the over read. Pt was scheduled for an appt on 5/13 and cancelled. Please give patient a call.

## 2019-04-11 NOTE — Telephone Encounter (Signed)
I spoke with patient and informed her that the disc from 04/14/18 has been requested and it may take another 2-3 weeks to get results back.  I instructed her to call office back in about 2 weeks to schedule follow up with Dr. Milinda Pointer and hopefully we should  Have the results back when she comes in.  She verbalized understanding and was happy with that.

## 2019-04-12 ENCOUNTER — Ambulatory Visit: Payer: BC Managed Care – PPO | Admitting: Podiatry

## 2019-04-13 NOTE — Telephone Encounter (Signed)
Received 04/14/2018 MRI disc copy from [facility], and mailed to [facility].

## 2019-04-26 ENCOUNTER — Encounter: Payer: Self-pay | Admitting: Podiatry

## 2019-04-26 ENCOUNTER — Other Ambulatory Visit: Payer: Self-pay

## 2019-04-26 ENCOUNTER — Ambulatory Visit: Payer: BC Managed Care – PPO | Admitting: Podiatry

## 2019-04-26 VITALS — Temp 97.1°F

## 2019-04-26 DIAGNOSIS — M84375D Stress fracture, left foot, subsequent encounter for fracture with routine healing: Secondary | ICD-10-CM

## 2019-04-26 MED ORDER — HYDROCODONE-ACETAMINOPHEN 10-325 MG PO TABS: 1.0000 | ORAL_TABLET | Freq: Four times a day (QID) | ORAL | 0 refills | Status: AC | PRN

## 2019-04-26 NOTE — Progress Notes (Signed)
She presents today for follow-up of her MRI to her right foot.  States that it still hurts and swells she states that it seems to be much worse at nighttime.  Objective: Vital signs are stable she is alert and oriented x3 still has swelling and pain on the left foot.  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  Assessment: Fracture foot.  Plan: Knee scooter and cam walker.  Follow-up with her in 6 weeks

## 2019-05-03 IMAGING — CT CT HEAD W/O CM
3 series · 15 of 45 positions shown, 18 images · non-contrast
Comparison: November 01, 2017

CLINICAL DATA: Headache

EXAM:
CT HEAD WITHOUT CONTRAST
TECHNIQUE: Contiguous axial images were obtained from the base of the skull
through the vertex without intravenous contrast. Note that the
patient received intravenous contrast earlier in the day for CT
angiogram chest.

[Series 2: head wo · axial · 0.47mm/px · z∈[-125,-10]mm · 9 of 28 slices shown, 12 images]
[im 3/28  brain]
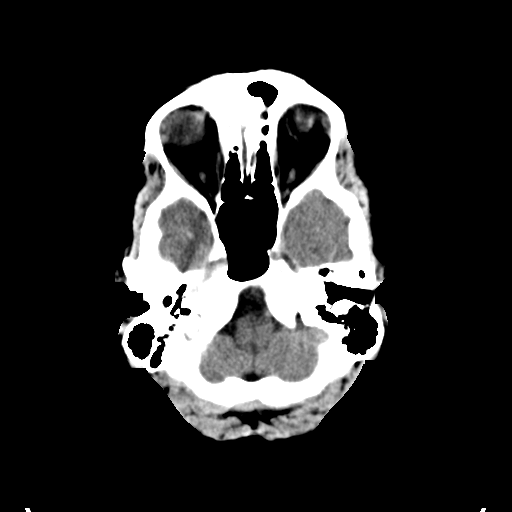
[im 3/28  bone]
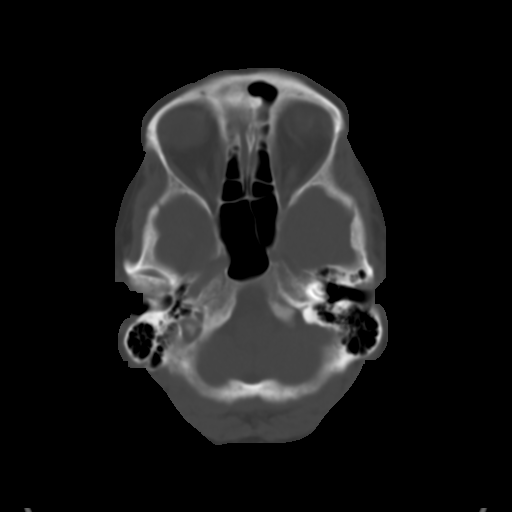
[im 6/28  brain]
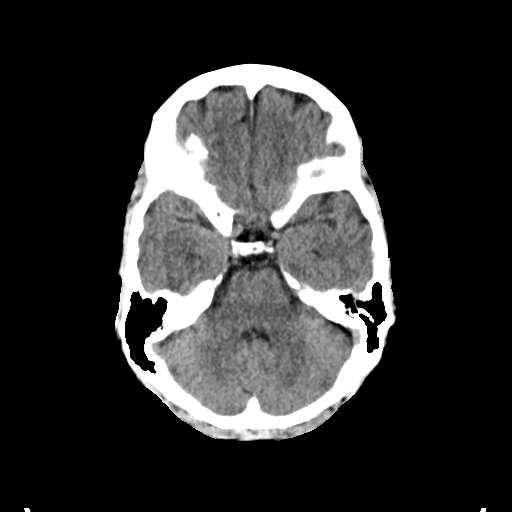
[im 9/28  brain]
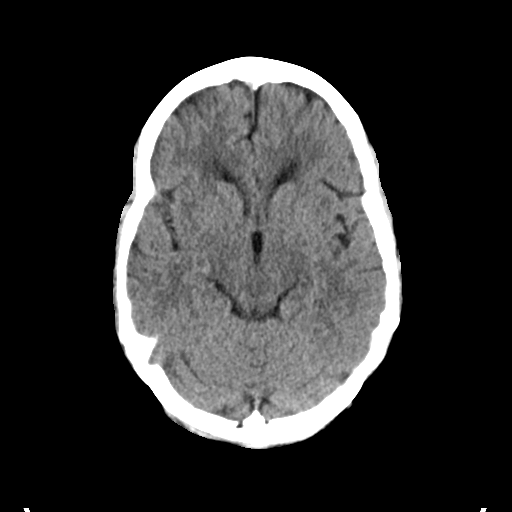
[im 12/28  brain]
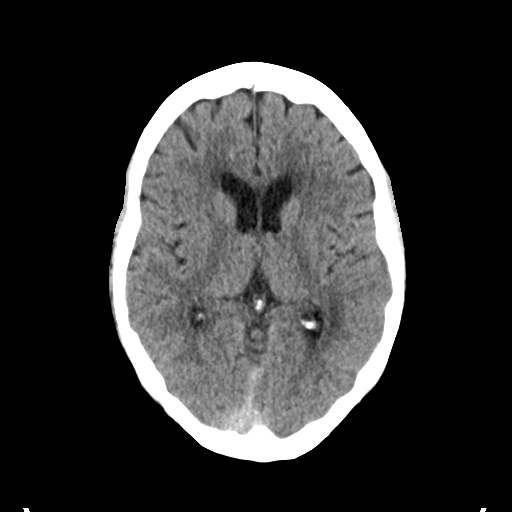
[im 15/28  brain]
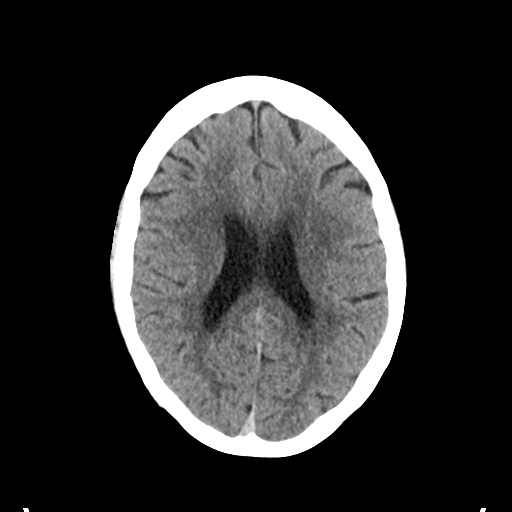
[im 15/28  bone]
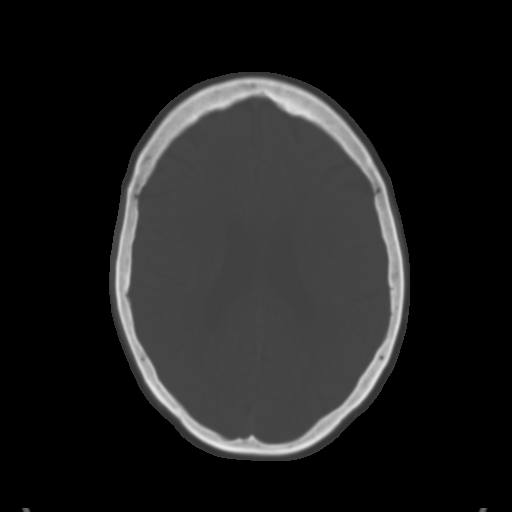
[im 17/28  brain]
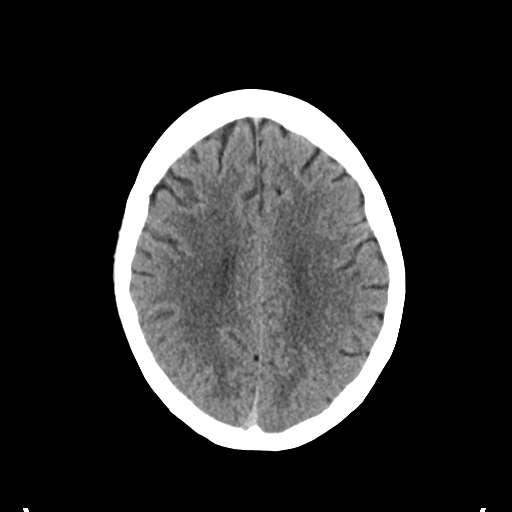
[im 20/28  brain]
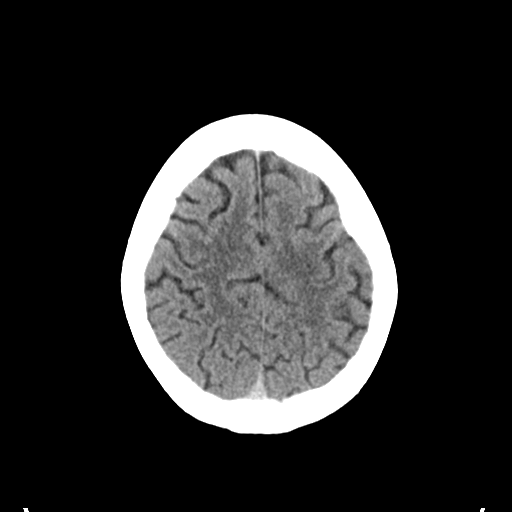
[im 23/28  brain]
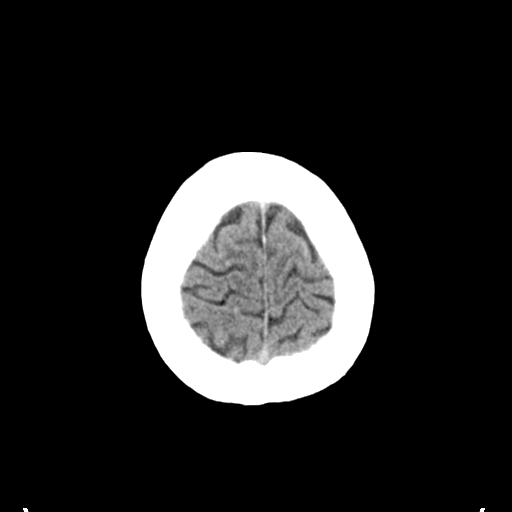
[im 26/28  brain]
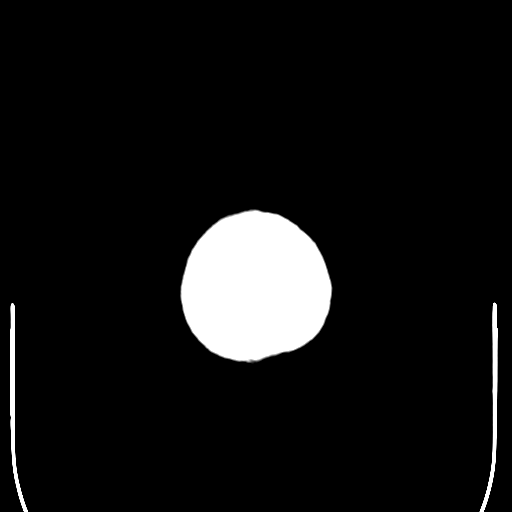
[im 26/28  bone]
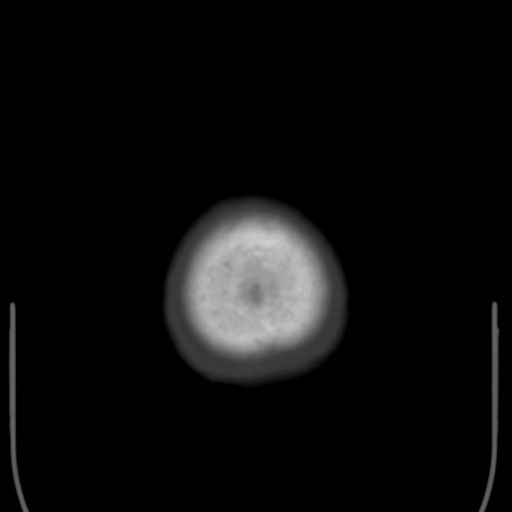

[Series 4: coronal soft tissue · coronal · 0.27mm/px · 3 of 65 slices shown]
[im 22/65  brain]
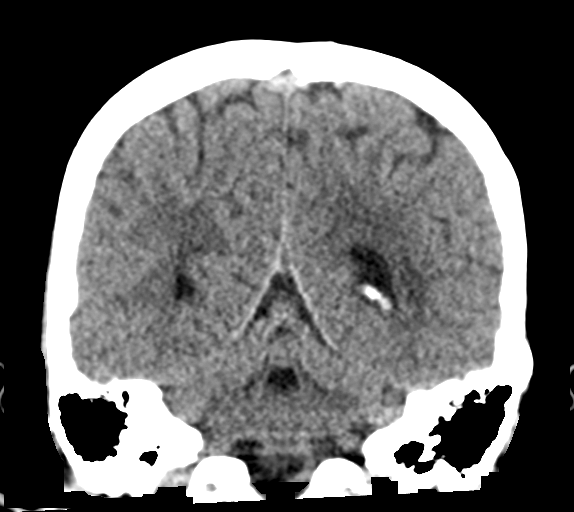
[im 29/65  brain]
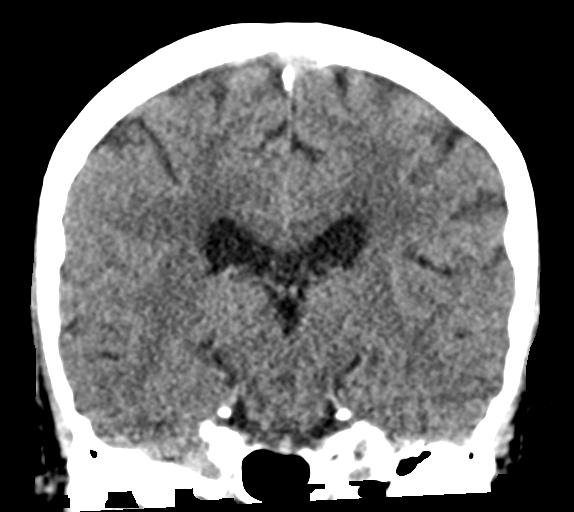
[im 36/65  brain]
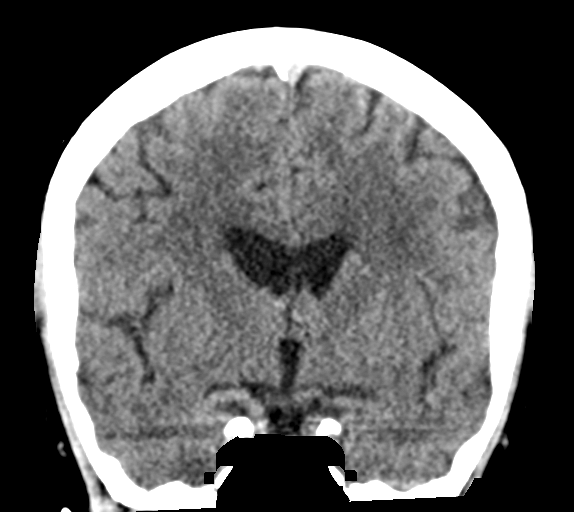

[Series 5: sagittal soft tissue · sagittal · 0.27mm/px · 3 of 53 slices shown]
[im 18/53  brain]
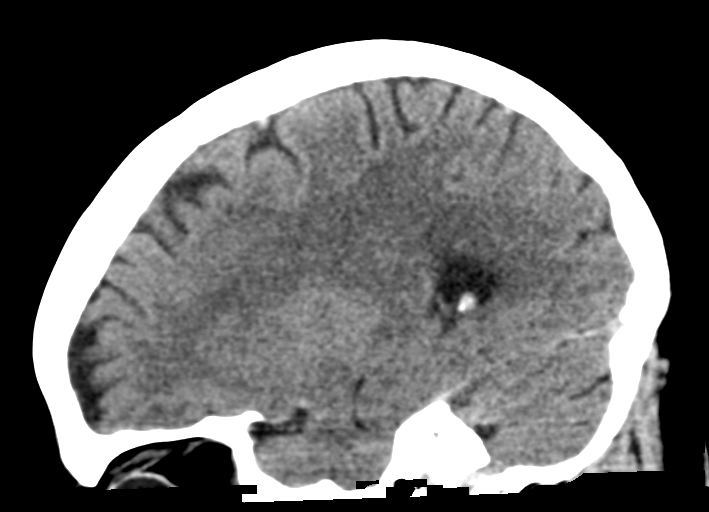
[im 27/53  brain]
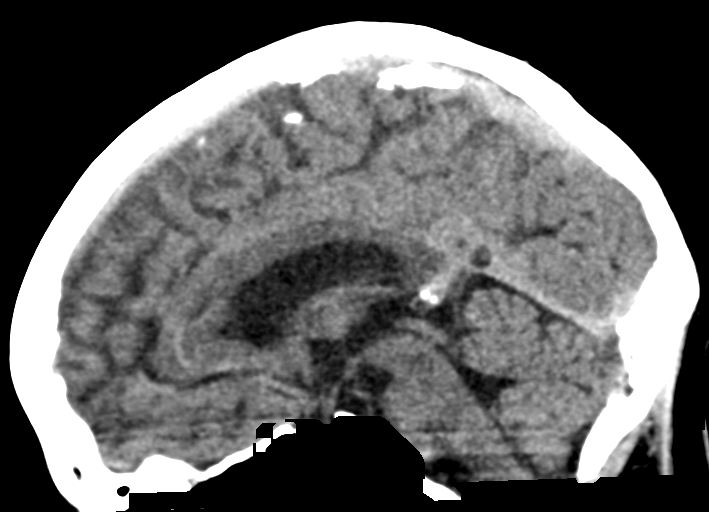
[im 35/53  brain]
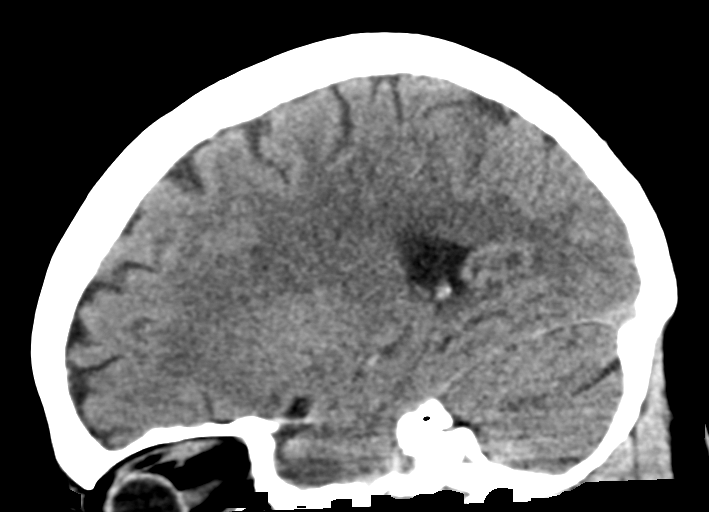

[15 of 45 positions shown; findings below may reference images not displayed]

FINDINGS: Brain: The ventricles and sulci appear within normal limits. There
is no intracranial mass, hemorrhage, extra-axial fluid collection,
or midline shift. There is slight small vessel disease in the centra
semiovale bilaterally. Brain parenchyma elsewhere appears
unremarkable. No evident acute infarct.

Vascular: No hyperdense vessel. No appreciable vascular
calcifications are evident.

Skull: The bony calvarium appears intact.

Sinuses/Orbits: Visualized paranasal sinuses are clear. Visualized
orbits appear symmetric bilaterally.

Other: Visualized mastoid air cells are clear.
IMPRESSION: Mild periventricular small vessel disease, stable. No acute infarct
evident. No mass or hemorrhage.

## 2019-05-23 DIAGNOSIS — I10 Essential (primary) hypertension: Secondary | ICD-10-CM | POA: Insufficient documentation

## 2019-05-31 ENCOUNTER — Encounter: Payer: Self-pay | Admitting: Podiatry

## 2019-05-31 ENCOUNTER — Ambulatory Visit (INDEPENDENT_AMBULATORY_CARE_PROVIDER_SITE_OTHER): Payer: BC Managed Care – PPO | Admitting: Podiatry

## 2019-05-31 ENCOUNTER — Ambulatory Visit (INDEPENDENT_AMBULATORY_CARE_PROVIDER_SITE_OTHER): Payer: BC Managed Care – PPO

## 2019-05-31 ENCOUNTER — Other Ambulatory Visit: Payer: Self-pay

## 2019-05-31 VITALS — Temp 97.4°F

## 2019-05-31 DIAGNOSIS — M84375D Stress fracture, left foot, subsequent encounter for fracture with routine healing: Secondary | ICD-10-CM

## 2019-05-31 NOTE — Progress Notes (Signed)
She presents today ambulating on her left foot states that she is here for follow-up of her fracture to her midfoot but is doing fine she is hurting around the ankle.  Dates that she does not like wearing that boot.  Objective: She presents today ambulating with antalgic gait to the left lower extremity.  Left lower extremity is moderately swollen pulses are palpable.  She has pain on palpation to the base of the second and third metatarsal cuneiform joints.  She also has pain on frontal plane range of motion she has mild tenderness on palpation of the soft tissues superior to the peroneal tendons and just anterior to the lateral malleolus.  Assessment: Fractured midfoot per MRI left.  Plan: I think part of her ankle pain and swelling is associated with her not wearing her cam walker and favoring this foot.  I highly recommend she get back in her Cam walker for another 4 weeks

## 2019-06-09 ENCOUNTER — Other Ambulatory Visit: Payer: Self-pay

## 2019-06-09 ENCOUNTER — Encounter: Payer: Self-pay | Admitting: Emergency Medicine

## 2019-06-09 ENCOUNTER — Ambulatory Visit
Admission: EM | Admit: 2019-06-09 | Discharge: 2019-06-09 | Disposition: A | Discharge status: Home / Self Care | Payer: BC Managed Care – PPO | Attending: Family Medicine | Admitting: Family Medicine

## 2019-06-09 DIAGNOSIS — R319 Hematuria, unspecified: Secondary | ICD-10-CM

## 2019-06-09 DIAGNOSIS — N390 Urinary tract infection, site not specified: Secondary | ICD-10-CM | POA: Diagnosis not present

## 2019-06-09 LAB — URINALYSIS, COMPLETE (UACMP) WITH MICROSCOPIC
Bacteria, UA: NONE SEEN
Glucose, UA: NEGATIVE mg/dL
Nitrite: POSITIVE — AB
Protein, ur: NEGATIVE mg/dL
Specific Gravity, Urine: >1.03 — ABNORMAL HIGH (ref 1.005–1.030)
pH: 5 (ref 5.0–8.0)

## 2019-06-09 MED ORDER — NITROFURANTOIN MONOHYD MACRO 100 MG PO CAPS: 100.0000 mg | ORAL_CAPSULE | Freq: Two times a day (BID) | ORAL | 0 refills | Status: DC
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
## 2019-06-09 NOTE — ED Triage Notes (Signed)
Pt c/o lower back pain. Started about 4-5 days ago. She also states that [place] [employer] and [place] racks.

## 2019-06-09 NOTE — Discharge Instructions (Signed)
Increase water intake

## 2019-06-11 LAB — URINE CULTURE
Culture: <10000 — AB
Special Requests: NORMAL

## 2019-06-20 ENCOUNTER — Encounter: Payer: Self-pay | Admitting: Emergency Medicine

## 2019-06-20 ENCOUNTER — Other Ambulatory Visit: Payer: Self-pay

## 2019-06-20 ENCOUNTER — Emergency Department: Payer: BC Managed Care – PPO

## 2019-06-20 ENCOUNTER — Emergency Department
Admission: EM | Admit: 2019-06-20 | Discharge: 2019-06-20 | Disposition: A | Discharge status: Home / Self Care | Payer: BC Managed Care – PPO | Attending: Emergency Medicine | Admitting: Emergency Medicine

## 2019-06-20 DIAGNOSIS — R0789 Other chest pain: Secondary | ICD-10-CM

## 2019-06-20 DIAGNOSIS — Z87891 Personal history of nicotine dependence: Secondary | ICD-10-CM | POA: Diagnosis not present

## 2019-06-20 DIAGNOSIS — E039 Hypothyroidism, unspecified: Secondary | ICD-10-CM | POA: Diagnosis not present

## 2019-06-20 DIAGNOSIS — I10 Essential (primary) hypertension: Secondary | ICD-10-CM | POA: Insufficient documentation

## 2019-06-20 DIAGNOSIS — Z960 Presence of urogenital implants: Secondary | ICD-10-CM | POA: Diagnosis not present

## 2019-06-20 DIAGNOSIS — J45909 Unspecified asthma, uncomplicated: Secondary | ICD-10-CM | POA: Insufficient documentation

## 2019-06-20 DIAGNOSIS — E119 Type 2 diabetes mellitus without complications: Secondary | ICD-10-CM | POA: Diagnosis not present

## 2019-06-20 DIAGNOSIS — Z79899 Other long term (current) drug therapy: Secondary | ICD-10-CM | POA: Diagnosis not present

## 2019-06-20 DIAGNOSIS — Z7982 Long term (current) use of aspirin: Secondary | ICD-10-CM | POA: Insufficient documentation

## 2019-06-20 LAB — COMPREHENSIVE METABOLIC PANEL
ALT: 20 U/L (ref 0–44)
AST: 17 U/L (ref 15–41)
Albumin: 3.9 g/dL (ref 3.5–5.0)
Alkaline Phosphatase: 122 U/L (ref 38–126)
Anion gap: 9 (ref 5–15)
BUN: 17 mg/dL (ref 8–23)
CO2: 26 mmol/L (ref 22–32)
Calcium: 8.7 mg/dL — ABNORMAL LOW (ref 8.9–10.3)
Chloride: 104 mmol/L (ref 98–111)
Creatinine, Ser: 0.8 mg/dL (ref 0.44–1.00)
GFR calc Af Amer: >60 mL/min (ref >60)
GFR calc non Af Amer: >60 mL/min (ref >60)
Glucose, Bld: 181 mg/dL — ABNORMAL HIGH (ref 70–99)
Potassium: 3.5 mmol/L (ref 3.5–5.1)
Sodium: 139 mmol/L (ref 135–145)
Total Bilirubin: 0.4 mg/dL (ref 0.3–1.2)
Total Protein: 6.5 g/dL (ref 6.5–8.1)

## 2019-06-20 LAB — CBC
HCT: 37.5 % (ref 36.0–46.0)
Hemoglobin: 12.6 g/dL (ref 12.0–15.0)
MCH: 31 pg (ref 26.0–34.0)
MCHC: 33.6 g/dL (ref 30.0–36.0)
MCV: 92.1 fL (ref 80.0–100.0)
Platelets: 244 10*3/uL (ref 150–400)
RBC: 4.07 MIL/uL (ref 3.87–5.11)
RDW: 12.1 % (ref 11.5–15.5)
WBC: 10.5 10*3/uL (ref 4.0–10.5)
nRBC: 0 % (ref 0.0–0.2)

## 2019-06-20 LAB — FIBRIN DERIVATIVES D-DIMER (ARMC ONLY): Fibrin derivatives D-dimer (ARMC): 851.44 ng/mL (FEU) — ABNORMAL HIGH (ref 0.00–499.00)

## 2019-06-20 LAB — TROPONIN I (HIGH SENSITIVITY)
Troponin I (High Sensitivity): 5 ng/L (ref <18)
Troponin I (High Sensitivity): 6 ng/L (ref <18)

## 2019-06-20 MED ORDER — IOHEXOL 350 MG/ML SOLN
75.0000 mL | Freq: Once | INTRAVENOUS | Status: AC | PRN
  Administered 2019-06-20: 75 mL via INTRAVENOUS

## 2019-06-20 MED ORDER — MORPHINE SULFATE (PF) 4 MG/ML IV SOLN
4.0000 mg | Freq: Once | INTRAVENOUS | Status: AC
  Administered 2019-06-20: 4 mg via INTRAVENOUS
  Filled 2019-06-20: qty 1

## 2019-06-20 MED ORDER — ONDANSETRON HCL 4 MG/2ML IJ SOLN
4.0000 mg | Freq: Once | INTRAMUSCULAR | Status: AC
  Administered 2019-06-20: 4 mg via INTRAVENOUS
  Filled 2019-06-20: qty 2

## 2019-06-20 NOTE — ED Provider Notes (Signed)
-----------------------------------------   6:46 PM on 06/20/2019 -----------------------------------------  I took over care of this patient from Dr. Kerman Passey.  Initial and repeat troponins were both negative.  The d-dimer was elevated so I obtained a CT chest.  The patient does have a prior history of PE but is no longer on anticoagulation.  The CT chest was negative.  At this time, the patient is comfortable and has no active pain.  Musculoskeletal pain was suspected on her initial evaluation.  She is stable for discharge home.  I counseled her on the results of the work-up.  Return precautions given, and she expresses understanding.   Arta Silence, MD 06/20/19 [phone]

## 2019-06-20 NOTE — ED Provider Notes (Signed)
[facility] Emergency Department Provider Note  Time seen: 2:34 PM  I have reviewed the triage vital signs and the nursing notes.   HISTORY  Chief Complaint Chest Pain   HPI Courtney Murphy is a 63 y.o. female with a past medical history of gastric reflux, diabetes, hypertension, presents to the emergency department for chest pain.  According to the patient for the past 3 days she has been experiencing intermittent chest pain any pleuritic nature.  Denies any fever or cough.  Denies any shortness of breath.  Patient states she has been diagnosed with angina in the past approximately 8 months ago as her only episode.  Patient received nitroglycerin by EMS but states it made her pain worse.  Currently states she has a 6 or 7/10 dull aching pain in the center of her chest but occasionally will radiate to her left shoulder.   Past Medical History:  Diagnosis Date  . Acid reflux 10/11/2015  . Anemia    RECEIVES IRON INFUSIONS  . Arthritis   . Asthma    WELL CONTROLLED  . Depression   . Diabetes mellitus without complication ([place])   . Fatty liver   . Gout   . History of kidney stones   . History of methicillin resistant staphylococcus aureus (MRSA) 2016  . Hypertension    H/O BEEN OFF BP MEDS FOR 1 YEAR  . Hypothyroidism   . Kidney stone   . Knee pain    Left  . Pulmonary emboli ([place]) 2011  . Restless leg syndrome   . Shoulder pain     Patient Active Problem List   Diagnosis Date Noted  . Unstable angina ([place]) 01/14/2019  . Kidney stone 08/17/2017  . Type 2 diabetes mellitus without complication, without long-term current use of insulin ([place]) 04/22/2017  . Chest pain 09/30/2016  . Acid reflux 10/11/2015  . Adult hypothyroidism 10/11/2015  . ADHD (attention deficit hyperactivity disorder) 10/11/2015  . Bariatric surgery status 10/22/2014  . HLD (hyperlipidemia) 08/18/2013    Past Surgical History:  Procedure Laterality Date  . ABDOMINAL  HYSTERECTOMY    . BACK SURGERY    . BREAST REDUCTION SURGERY    . CATARACT EXTRACTION W/PHACO Left 07/28/2017   Procedure: CATARACT EXTRACTION PHACO AND INTRAOCULAR LENS PLACEMENT ([place]) diabetic Left;  Surgeon: Leandrew Koyanagi, MD;  Location: [place];  Service: Ophthalmology;  Laterality: Left;  Diabetic  . CESAREAN SECTION  1990  . CHOLECYSTECTOMY    . CYSTOSCOPY/URETEROSCOPY/HOLMIUM LASER/STENT PLACEMENT Left 09/08/2017   Procedure: CYSTOSCOPY/URETEROSCOPY/HOLMIUM LASER/STENT EXCHANGE;  Surgeon: Hollice Espy, MD;  Location: [facility];  Service: Urology;  Laterality: Left;  . CYSTOSCOPY/URETEROSCOPY/HOLMIUM LASER/STENT PLACEMENT Left 12/14/2018   Procedure: CYSTOSCOPY/URETEROSCOPY/HOLMIUM LASER/STENT PLACEMENT;  Surgeon: Hollice Espy, MD;  Location: [facility];  Service: Urology;  Laterality: Left;  . FOOT SURGERY    . GASTRIC BYPASS    . IR NEPHROSTOMY PLACEMENT LEFT  08/17/2017  . KNEE ARTHROSCOPY Left   . KNEE ARTHROSCOPY Left 07/19/2015   Procedure: ARTHROSCOPY KNEE;  Surgeon: Leanor Kail, MD;  Location: [facility];  Service: Orthopedics;  Laterality: Left;  . NEPHROLITHOTOMY Left 08/17/2017   Procedure: NEPHROLITHOTOMY PERCUTANEOUS WITH HOLMIUM LASER;  Surgeon: Hollice Espy, MD;  Location: [facility];  Service: Urology;  Laterality: Left;  . THUMB ARTHROSCOPY    . TONSILLECTOMY      Prior to Admission medications   Medication Sig Start Date End Date Taking? Authorizing Provider  aspirin EC 81 MG EC tablet Take 1 tablet (  81 mg total) by mouth daily. 01/14/19   Loletha Grayer, MD  atomoxetine (STRATTERA) 80 MG capsule Take 1 capsule (80 mg total) by mouth daily. Patient taking differently: Take 80 mg by mouth every morning.  10/17/18 06/09/19  Marnee Guarneri T, NP  atorvastatin (LIPITOR) 20 MG tablet Take 20 mg by mouth every morning.  06/27/18   [provider]  conjugated estrogens (PREMARIN) vaginal cream Place 1 Applicatorful vaginally daily. Use pea  sized amount M-W-Fr before bedtime 01/12/19   Hollice Espy, MD  cyanocobalamin (,VITAMIN B-12,) 1000 MCG/ML injection Inject 1,000 mcg into the muscle every 7 (seven) days.  06/16/18   [provider]  diclofenac sodium (VOLTAREN) 1 % GEL Apply 2 g topically 4 (four) times daily. Rub into affected area of foot 2 to 4 times daily 02/28/19   Trula Slade, DPM  Dulaglutide (TRULICITY) 5.46 FK/8.1EX SOPN Inject 0.75 mg into the skin every Thursday.     [provider]  escitalopram (LEXAPRO) 10 MG tablet Take 20 mg by mouth every morning.  09/13/18   [provider]  gabapentin (NEURONTIN) 300 MG capsule Take 300 mg by mouth at bedtime as needed (neuropathy).     [provider]  HYDROcodone-acetaminophen (NORCO) 10-325 MG tablet Take 1 tablet by mouth every 8 (eight) hours as needed. 03/09/19   Hyatt, Max T, DPM  levothyroxine (SYNTHROID, LEVOTHROID) 100 MCG tablet Take 100 mcg by mouth daily before breakfast.    [provider]  LINZESS 145 MCG CAPS capsule Take 1 capsule by mouth daily. 01/13/19   [provider]  lisinopril (ZESTRIL) 20 MG tablet Take 20 mg by mouth daily. 04/25/19   [provider]  metoprolol succinate (TOPROL XL) 25 MG 24 hr tablet Take 1 tablet (25 mg total) by mouth at bedtime. 01/14/19   Loletha Grayer, MD  nitrofurantoin, macrocrystal-monohydrate, (MACROBID) 100 MG capsule Take 1 capsule (100 mg total) by mouth 2 (two) times daily. 06/09/19   Norval Gable, MD  pramipexole (MIRAPEX) 0.5 MG tablet Take 0.5 mg by mouth at bedtime. May take an additional 0.5 mg for restless legs    [provider]    Allergies  Allergen Reactions  . Sulfasalazine Hives  . 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 Sulfa Antibiotics Hives  . Penicillins Hives and Rash    Has patient had a PCN reaction causing immediate rash, facial/tongue/throat swelling, SOB or lightheadedness with hypotension: No Has patient  had a PCN reaction causing severe rash involving mucus membranes or skin necrosis: No Has patient had a PCN reaction that required hospitalization: No Has patient had a PCN reaction occurring within the last 10 years: No If all of the above answers are "NO", then may proceed with Cephalosporin use.     Family History  Problem Relation Age of Onset  . Cancer Mother        breast  . Heart disease Father   . Diabetes Father   . Hypertension Father   . Cancer Maternal Grandmother   . Cancer Maternal Grandfather   . Heart disease Paternal Grandmother   . Heart disease Paternal Grandfather   . Cancer Maternal Aunt   . Cancer Maternal Uncle   . Heart disease Paternal Aunt   . Heart disease Paternal Uncle   . Kidney cancer Neg Hx   . Bladder Cancer Neg Hx     Social History Social History   Tobacco Use  . Smoking status:  Former Smoker    Packs/day: 0.50    Years: 4.00    Pack years: 2.00    Types: Cigarettes    Quit date: 04/14/2015    Years since quitting: 4.1  . Smokeless tobacco: Never Used  Substance Use Topics  . Alcohol use: Yes    Comment: RARE  . Drug use: No    Review of Systems Constitutional: Negative for fever. Cardiovascular: Central chest pain. Respiratory: Negative for shortness of breath.  Negative for cough. Gastrointestinal: Negative for abdominal pain, vomiting Musculoskeletal: Negative for musculoskeletal complaints Skin: Negative for skin complaints  Neurological: Negative for headache All other ROS negative  ____________________________________________   PHYSICAL EXAM:  VITAL SIGNS: ED Triage Vitals  Enc Vitals Group     BP 06/20/19 1422 125/84     Pulse Rate 06/20/19 1422 82     Resp 06/20/19 1422 (!) 24     Temp 06/20/19 1422 98.4 F (36.9 C)     Temp Source 06/20/19 1422 Oral     SpO2 06/20/19 1422 98 %     Weight 06/20/19 1424 190 lb (86.2 kg)     Height 06/20/19 1424 5\' 6"  (1.676 m)     Head Circumference --      Peak Flow --       Pain Score --      Pain Loc --      Pain Edu? --      Excl. in [place]? --    Constitutional: Alert and oriented. Well appearing and in no distress. Eyes: Normal exam ENT      Head: Normocephalic and atraumatic      Mouth/Throat: Mucous membranes are moist. Cardiovascular: Normal rate, regular rhythm. No murmur Respiratory: Normal respiratory effort without tachypnea nor retractions. Breath sounds are clear Gastrointestinal: Soft and nontender. No distention. Musculoskeletal: Moderate left chest wall tenderness to palpation.  No lower extremity tenderness. Neurologic:  Normal speech and language. No gross focal neurologic deficits Skin:  Skin is warm, dry and intact.  Psychiatric: Mood and affect are normal.   ____________________________________________    EKG  EKG viewed and interpreted by myself shows a normal sinus rhythm at 84 bpm with a narrow QRS, normal axis, normal intervals, no concerning ST changes.  ____________________________________________    SFKCLEXNT  Chest x-ray pending  ____________________________________________   INITIAL IMPRESSION / ASSESSMENT AND PLAN / ED COURSE  Pertinent labs & imaging results that were available during my care of the patient were reviewed by me and considered in my medical decision making (see chart for details).   Patient presents to the emergency department for chest pain which she states is been intermittent over the past 3 days.  Denies any fever cough or shortness of breath.  No diaphoresis or nausea.  Received nitroglycerin and aspirin by EMS, states the nitroglycerin might of slightly worsened the pain.  Currently rates the pain is a 6/10 dull aching pain.  On examination patient does have moderate chest wall tenderness to palpation, fairly reproducible pain.  Differential at this time would include angina, ACS, chest wall discomfort, pneumonia, pneumothorax.  Patient's vitals are reassuring.  We will obtain labs, chest x-ray  and continue to closely monitor.  We will dose pain medication and continue to closely assess the patient.  Chest x-ray and labs are pending.  Patient care signed out to oncoming physician.  Courtney Murphy was evaluated in Emergency Department on 06/20/2019 for the symptoms described in the history of present illness. She was  evaluated in the context of the global COVID-19 pandemic, which necessitated consideration that the patient might be at risk for infection with the SARS-CoV-2 virus that causes COVID-19. Institutional protocols and algorithms that pertain to the evaluation of patients at risk for COVID-19 are in a state of rapid change based on information released by regulatory bodies including the CDC and federal and state organizations. These policies and algorithms were followed during the patient's care in the ED.  ____________________________________________   FINAL CLINICAL IMPRESSION(S) / ED DIAGNOSES  Chest pain   Harvest Dark, MD 06/20/19 1448

## 2019-06-20 NOTE — ED Triage Notes (Signed)
Pt ems from home for chest pain for 3 days. Pt given 1 spray of ntg with no effect, pt took 325 of aspirin at home. Pt stated she has hx of episodic angina.

## 2019-06-20 NOTE — Discharge Instructions (Addendum)
Return to the ER for new, worsening, or persistent severe chest pain, difficulty breathing, weakness, or any other new or worsening symptoms that concern you. 

## 2019-07-02 ENCOUNTER — Encounter: Payer: Self-pay | Admitting: Emergency Medicine

## 2019-07-02 ENCOUNTER — Other Ambulatory Visit: Payer: Self-pay

## 2019-07-02 ENCOUNTER — Emergency Department
Admission: EM | Admit: 2019-07-02 | Discharge: 2019-07-02 | Disposition: A | Discharge status: Home / Self Care | Payer: BC Managed Care – PPO | Attending: Emergency Medicine | Admitting: Emergency Medicine

## 2019-07-02 DIAGNOSIS — J45909 Unspecified asthma, uncomplicated: Secondary | ICD-10-CM | POA: Insufficient documentation

## 2019-07-02 DIAGNOSIS — Y999 Unspecified external cause status: Secondary | ICD-10-CM | POA: Diagnosis not present

## 2019-07-02 DIAGNOSIS — Y93G1 Activity, food preparation and clean up: Secondary | ICD-10-CM | POA: Insufficient documentation

## 2019-07-02 DIAGNOSIS — I10 Essential (primary) hypertension: Secondary | ICD-10-CM | POA: Diagnosis not present

## 2019-07-02 DIAGNOSIS — E039 Hypothyroidism, unspecified: Secondary | ICD-10-CM | POA: Insufficient documentation

## 2019-07-02 DIAGNOSIS — Z7982 Long term (current) use of aspirin: Secondary | ICD-10-CM | POA: Diagnosis not present

## 2019-07-02 DIAGNOSIS — E119 Type 2 diabetes mellitus without complications: Secondary | ICD-10-CM | POA: Insufficient documentation

## 2019-07-02 DIAGNOSIS — W290XXA Contact with powered kitchen appliance, initial encounter: Secondary | ICD-10-CM | POA: Diagnosis not present

## 2019-07-02 DIAGNOSIS — Z87891 Personal history of nicotine dependence: Secondary | ICD-10-CM | POA: Diagnosis not present

## 2019-07-02 DIAGNOSIS — Y929 Unspecified place or not applicable: Secondary | ICD-10-CM | POA: Diagnosis not present

## 2019-07-02 DIAGNOSIS — Z79899 Other long term (current) drug therapy: Secondary | ICD-10-CM | POA: Insufficient documentation

## 2019-07-02 DIAGNOSIS — S61011A Laceration without foreign body of right thumb without damage to nail, initial encounter: Secondary | ICD-10-CM | POA: Insufficient documentation

## 2019-07-02 MED ORDER — LIDOCAINE HCL (PF) 1 % IJ SOLN
5.0000 mL | Freq: Once | INTRAMUSCULAR | Status: AC
  Administered 2019-07-02: 5 mL via INTRADERMAL
  Filled 2019-07-02: qty 5

## 2019-07-02 NOTE — ED Triage Notes (Signed)
Laceration today to right thumb from cleaning ninja bullet.   Last tetanus shot according to [facility] records was 08/2018

## 2019-07-02 NOTE — ED Notes (Signed)
Wound dressed. ?

## 2019-07-02 NOTE — ED Provider Notes (Signed)
[facility] Emergency Department Provider Note  ____________________________________________  Time seen: Approximately 8:01 PM  I have reviewed the triage vital signs and the nursing notes.   HISTORY  Chief Complaint Laceration   HPI Courtney Murphy is a 63 y.o. female who presents to the emergency department for treatment and evaluation of laceration to the right thumb after cutting it on a blender blade.  Tdap is up-to-date.  Bleeding is well controlled.  No alleviating measures attempted prior to arrival.   Past Medical History:  Diagnosis Date  . Acid reflux 10/11/2015  . Anemia    RECEIVES IRON INFUSIONS  . Arthritis   . Asthma    WELL CONTROLLED  . Depression   . Diabetes mellitus without complication ([place])   . Fatty liver   . Gout   . History of kidney stones   . History of methicillin resistant staphylococcus aureus (MRSA) 2016  . Hypertension    H/O BEEN OFF BP MEDS FOR 1 YEAR  . Hypothyroidism   . Kidney stone   . Knee pain    Left  . Pulmonary emboli ([place]) 2011  . Restless leg syndrome   . Shoulder pain     Patient Active Problem List   Diagnosis Date Noted  . Unstable angina ([place]) 01/14/2019  . Kidney stone 08/17/2017  . Type 2 diabetes mellitus without complication, without long-term current use of insulin ([place]) 04/22/2017  . Chest pain 09/30/2016  . Acid reflux 10/11/2015  . Adult hypothyroidism 10/11/2015  . ADHD (attention deficit hyperactivity disorder) 10/11/2015  . Bariatric surgery status 10/22/2014  . HLD (hyperlipidemia) 08/18/2013    Past Surgical History:  Procedure Laterality Date  . ABDOMINAL HYSTERECTOMY    . BACK SURGERY    . BREAST REDUCTION SURGERY    . CATARACT EXTRACTION W/PHACO Left 07/28/2017   Procedure: CATARACT EXTRACTION PHACO AND INTRAOCULAR LENS PLACEMENT ([place]) diabetic Left;  Surgeon: Leandrew Koyanagi, MD;  Location: [place];  Service: Ophthalmology;  Laterality: Left;  Diabetic   . CESAREAN SECTION  1990  . CHOLECYSTECTOMY    . CYSTOSCOPY/URETEROSCOPY/HOLMIUM LASER/STENT PLACEMENT Left 09/08/2017   Procedure: CYSTOSCOPY/URETEROSCOPY/HOLMIUM LASER/STENT EXCHANGE;  Surgeon: Hollice Espy, MD;  Location: [facility];  Service: Urology;  Laterality: Left;  . CYSTOSCOPY/URETEROSCOPY/HOLMIUM LASER/STENT PLACEMENT Left 12/14/2018   Procedure: CYSTOSCOPY/URETEROSCOPY/HOLMIUM LASER/STENT PLACEMENT;  Surgeon: Hollice Espy, MD;  Location: [facility];  Service: Urology;  Laterality: Left;  . FOOT SURGERY    . GASTRIC BYPASS    . IR NEPHROSTOMY PLACEMENT LEFT  08/17/2017  . KNEE ARTHROSCOPY Left   . KNEE ARTHROSCOPY Left 07/19/2015   Procedure: ARTHROSCOPY KNEE;  Surgeon: Leanor Kail, MD;  Location: [facility];  Service: Orthopedics;  Laterality: Left;  . NEPHROLITHOTOMY Left 08/17/2017   Procedure: NEPHROLITHOTOMY PERCUTANEOUS WITH HOLMIUM LASER;  Surgeon: Hollice Espy, MD;  Location: [facility];  Service: Urology;  Laterality: Left;  . THUMB ARTHROSCOPY    . TONSILLECTOMY      Prior to Admission medications   Medication Sig Start Date End Date Taking? Authorizing Provider  aspirin EC 81 MG EC tablet Take 1 tablet (81 mg total) by mouth daily. 01/14/19   Loletha Grayer, MD  atomoxetine (STRATTERA) 80 MG capsule Take 1 capsule (80 mg total) by mouth daily. Patient taking differently: Take 80 mg by mouth every morning.  10/17/18 06/09/19  Marnee Guarneri T, NP  atorvastatin (LIPITOR) 20 MG tablet Take 20 mg by mouth every morning.  06/27/18   [provider]  conjugated  estrogens (PREMARIN) vaginal cream Place 1 Applicatorful vaginally daily. Use pea sized amount M-W-Fr before bedtime 01/12/19   Hollice Espy, MD  cyanocobalamin (,VITAMIN B-12,) 1000 MCG/ML injection Inject 1,000 mcg into the muscle every 7 (seven) days.  06/16/18   [provider]  diclofenac sodium (VOLTAREN) 1 % GEL Apply 2 g topically 4 (four) times daily. Rub into affected area of foot 2 to  4 times daily 02/28/19   Trula Slade, DPM  Dulaglutide (TRULICITY) 6.44 IH/4.7QQ SOPN Inject 0.75 mg into the skin every Thursday.     [provider]  escitalopram (LEXAPRO) 10 MG tablet Take 20 mg by mouth every morning.  09/13/18   [provider]  gabapentin (NEURONTIN) 300 MG capsule Take 300 mg by mouth at bedtime as needed (neuropathy).     [provider]  HYDROcodone-acetaminophen (NORCO) 10-325 MG tablet Take 1 tablet by mouth every 8 (eight) hours as needed. 03/09/19   Hyatt, Max T, DPM  levothyroxine (SYNTHROID, LEVOTHROID) 100 MCG tablet Take 100 mcg by mouth daily before breakfast.    [provider]  LINZESS 145 MCG CAPS capsule Take 1 capsule by mouth daily. 01/13/19   [provider]  lisinopril (ZESTRIL) 20 MG tablet Take 20 mg by mouth daily. 04/25/19   [provider]  metoprolol succinate (TOPROL XL) 25 MG 24 hr tablet Take 1 tablet (25 mg total) by mouth at bedtime. 01/14/19   Loletha Grayer, MD  nitrofurantoin, macrocrystal-monohydrate, (MACROBID) 100 MG capsule Take 1 capsule (100 mg total) by mouth 2 (two) times daily. 06/09/19   Norval Gable, MD  pramipexole (MIRAPEX) 0.5 MG tablet Take 0.5 mg by mouth at bedtime. May take an additional 0.5 mg for restless legs    [provider]    Allergies Sulfasalazine, Nsaids, Sulfa antibiotics, and Penicillins  Family History  Problem Relation Age of Onset  . Cancer Mother        breast  . Heart disease Father   . Diabetes Father   . Hypertension Father   . Cancer Maternal Grandmother   . Cancer Maternal Grandfather   . Heart disease Paternal Grandmother   . Heart disease Paternal Grandfather   . Cancer Maternal Aunt   . Cancer Maternal Uncle   . Heart disease Paternal Aunt   . Heart disease Paternal Uncle   . Kidney cancer Neg Hx   . Bladder Cancer Neg Hx     Social History Social History   Tobacco Use  . Smoking status: Former Smoker     Packs/day: 0.50    Years: 4.00    Pack years: 2.00    Types: Cigarettes    Quit date: 04/14/2015    Years since quitting: 4.2  . Smokeless tobacco: Never Used  Substance Use Topics  . Alcohol use: Yes    Comment: RARE  . Drug use: No    Review of Systems  Constitutional: Negative for fever. Respiratory: Negative for cough or shortness of breath.  Musculoskeletal: Negative for myalgias Skin: Positive for laceration to the finger pad of the right thumb Neurological: Negative for numbness or paresthesias. ____________________________________________   PHYSICAL EXAM:  VITAL SIGNS: ED Triage Vitals  Enc Vitals Group     BP 07/02/19 1844 134/71     Pulse Rate 07/02/19 1844 89     Resp 07/02/19 1844 18     Temp 07/02/19 1844 98.8 F (37.1 C)     Temp Source 07/02/19 1844 Oral  SpO2 07/02/19 1844 96 %     Weight 07/02/19 1841 180 lb (81.6 kg)     Height 07/02/19 1841 5\' 4"  (1.626 m)     Head Circumference --      Peak Flow --      Pain Score 07/02/19 1841 0     Pain Loc --      Pain Edu? --      Excl. in [place]? --      Constitutional: Well appearing. Eyes: Conjunctivae are clear without discharge or drainage. Nose: No rhinorrhea noted. Mouth/Throat: Airway is patent.  Neck: No stridor. Unrestricted range of motion observed. Cardiovascular: Capillary refill is <3 seconds.  Respiratory: Respirations are even and unlabored.. Musculoskeletal: Unrestricted range of motion observed. Neurologic: Awake, alert, and oriented x 4.  Skin: 2.5 cm laceration to the pad of the right thumb.  ____________________________________________   LABS (all labs ordered are listed, but only abnormal results are displayed)  Labs Reviewed - No data to display ____________________________________________  EKG  Not indicated. ____________________________________________  RADIOLOGY  Not indicated ____________________________________________   PROCEDURES  .Marland KitchenLaceration  Repair  Date/Time: 07/02/2019 8:02 PM Performed by: Victorino Dike, FNP Authorized by: Victorino Dike, FNP   Consent:    Consent obtained:  Verbal   Consent given by:  Patient   Risks discussed:  Pain and poor wound healing Anesthesia (see MAR for exact dosages):    Anesthesia method:  Local infiltration   Local anesthetic:  Lidocaine 1% w/o epi Laceration details:    Location:  Finger   Finger location:  R thumb   Length (cm):  2.5 Repair type:    Repair type:  Simple Pre-procedure details:    Preparation:  Patient was prepped and draped in usual sterile fashion Exploration:    Contaminated: no   Treatment:    Area cleansed with:  Betadine and saline   Amount of cleaning:  Standard   Irrigation method:  Syringe Skin repair:    Repair method:  Sutures   Suture size:  5-0   Suture material:  Nylon   Suture technique:  Simple interrupted   Number of sutures:  4 Approximation:    Approximation:  Close Post-procedure details:    Dressing:  Antibiotic ointment and sterile dressing   Patient tolerance of procedure:  Tolerated well, no immediate complications   ____________________________________________   INITIAL IMPRESSION / ASSESSMENT AND PLAN / ED COURSE  Courtney Murphy is a 63 y.o. female who presents to the emergency department for treatment and evaluation after accidental laceration to the right thumb while cleaning the blades of a blender.  Wound was repaired and dressed as above.  Patient will be advised to have her primary care provider remove the sutures in approximately 10 days.  She is to see primary care provider sooner or return to the emergency department for symptoms of concern.   Medications  lidocaine (PF) (XYLOCAINE) 1 % injection 5 mL (5 mLs Intradermal Given 07/02/19 2012)     Pertinent labs & imaging results that were available during my care of the patient were reviewed by me and considered in my medical decision making (see chart for details).   ____________________________________________   FINAL CLINICAL IMPRESSION(S) / ED DIAGNOSES  Final diagnoses:  Laceration of right thumb without foreign body without damage to nail, initial encounter    ED Discharge Orders    None       Note:  This document was prepared using Dragon voice recognition software  and may include unintentional dictation errors.   Victorino Dike, FNP 07/02/19 2026    Nena Polio, MD 07/02/19 2138

## 2019-07-02 NOTE — Discharge Instructions (Signed)
Do not get the sutured area wet for 24 hours. After 24 hours, shower/bathe as usual and pat the area dry. °Change the bandage 2 times per day and apply antibiotic ointment. °Leave open to air when at no risk of getting the area dirty, but cover at night before bed. °See your PCP or go to Urgent Care in 10 days for suture removal or sooner for signs or concern of infection. ° °

## 2019-07-05 ENCOUNTER — Ambulatory Visit: Payer: BC Managed Care – PPO | Admitting: Podiatry

## 2019-07-05 ENCOUNTER — Encounter: Payer: Self-pay | Admitting: Podiatry

## 2019-07-05 ENCOUNTER — Other Ambulatory Visit: Payer: Self-pay

## 2019-07-05 VITALS — Temp 96.3°F

## 2019-07-05 DIAGNOSIS — G5761 Lesion of plantar nerve, right lower limb: Secondary | ICD-10-CM

## 2019-07-05 DIAGNOSIS — G5781 Other specified mononeuropathies of right lower limb: Secondary | ICD-10-CM

## 2019-07-05 DIAGNOSIS — M84375D Stress fracture, left foot, subsequent encounter for fracture with routine healing: Secondary | ICD-10-CM

## 2019-07-05 NOTE — Progress Notes (Signed)
She presents today for follow-up of her fracture left she states that it seems to be doing great right now however her right foot hurts right over here for this neuroma was.  Objective: Vital signs are stable she is alert and oriented x3.  There is no erythema edema cellulitis drainage or odor to the left foot right foot does demonstrate a palpable Mulder's click to the third interdigital space of the right foot.  Assessment: Neuroma third interspace right.  Plan: Injected the area today with Kenalog and local anesthetic.

## 2019-07-26 ENCOUNTER — Encounter: Payer: Self-pay | Admitting: Podiatry

## 2019-07-26 ENCOUNTER — Ambulatory Visit: Payer: BC Managed Care – PPO | Admitting: Podiatry

## 2019-07-26 ENCOUNTER — Other Ambulatory Visit: Payer: Self-pay

## 2019-07-26 DIAGNOSIS — M7672 Peroneal tendinitis, left leg: Secondary | ICD-10-CM | POA: Diagnosis not present

## 2019-07-26 MED ORDER — METHYLPREDNISOLONE 4 MG PO TBPK: ORAL_TABLET | ORAL | 0 refills | Status: DC

## 2019-07-26 MED ORDER — OXYCODONE-ACETAMINOPHEN 10-325 MG PO TABS: 1.0000 | ORAL_TABLET | Freq: Three times a day (TID) | ORAL | 0 refills | Status: AC | PRN

## 2019-07-26 NOTE — Progress Notes (Signed)
She presents today for follow-up of neuroma states the neuroma is doing great however her ankle is just killing her.  She is referring to the Perrone [place] longus tendon of the left ankle.  This is been previously corrected before.  She states that is really been bothering her with a lot of swelling recently.  Objective: Vital signs are stable she is alert and oriented x3.  Pulses are palpable.  She has swelling to the lateral inframalleolar region of the left ankle.  She has tenderness on abduction against resistance and plantarflexion with abduction of the foot.  She has pain on palpation to this area.  Reviewed her past of her MRI which does did not demonstrate tendinosis with a possible split tear.  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  Plan: After thorough discussion I injected the area 20 mg Kenalog 5 mg Marcaine point of maximal tenderness along with tendon sheath.  I also started her on a Medrol Dosepak.  And placed her in a Tri-Lock brace.  Follow-up with her in 1 month.  Instructed her to utilize ice.

## 2019-08-02 ENCOUNTER — Ambulatory Visit: Payer: BC Managed Care – PPO | Admitting: Podiatry

## 2019-08-10 ENCOUNTER — Other Ambulatory Visit: Payer: Self-pay | Admitting: Podiatry

## 2019-08-10 ENCOUNTER — Telehealth: Payer: Self-pay | Admitting: *Deleted

## 2019-08-10 MED ORDER — OXYCODONE-ACETAMINOPHEN 10-325 MG PO TABS: 1.0000 | ORAL_TABLET | Freq: Three times a day (TID) | ORAL | 0 refills | Status: AC | PRN

## 2019-08-10 NOTE — Telephone Encounter (Signed)
Pt request pain medication states the therapies they are trying are not helping and she is scheduled 08/23/2019.

## 2019-08-11 ENCOUNTER — Other Ambulatory Visit: Payer: Self-pay

## 2019-08-11 ENCOUNTER — Emergency Department: Payer: BC Managed Care – PPO

## 2019-08-11 ENCOUNTER — Observation Stay
Admission: EM | Admit: 2019-08-11 | Discharge: 2019-08-12 | Disposition: A | Discharge status: Home / Self Care | Payer: BC Managed Care – PPO | Attending: Internal Medicine | Admitting: Internal Medicine

## 2019-08-11 DIAGNOSIS — Z79890 Hormone replacement therapy (postmenopausal): Secondary | ICD-10-CM | POA: Insufficient documentation

## 2019-08-11 DIAGNOSIS — Z79899 Other long term (current) drug therapy: Secondary | ICD-10-CM | POA: Insufficient documentation

## 2019-08-11 DIAGNOSIS — Z9884 Bariatric surgery status: Secondary | ICD-10-CM | POA: Diagnosis not present

## 2019-08-11 DIAGNOSIS — Z8249 Family history of ischemic heart disease and other diseases of the circulatory system: Secondary | ICD-10-CM | POA: Diagnosis not present

## 2019-08-11 DIAGNOSIS — E872 Acidosis, unspecified: Secondary | ICD-10-CM

## 2019-08-11 DIAGNOSIS — Z20828 Contact with and (suspected) exposure to other viral communicable diseases: Secondary | ICD-10-CM | POA: Diagnosis not present

## 2019-08-11 DIAGNOSIS — E039 Hypothyroidism, unspecified: Secondary | ICD-10-CM | POA: Diagnosis not present

## 2019-08-11 DIAGNOSIS — Z7982 Long term (current) use of aspirin: Secondary | ICD-10-CM | POA: Diagnosis not present

## 2019-08-11 DIAGNOSIS — R1084 Generalized abdominal pain: Secondary | ICD-10-CM | POA: Diagnosis present

## 2019-08-11 DIAGNOSIS — G2581 Restless legs syndrome: Secondary | ICD-10-CM | POA: Diagnosis not present

## 2019-08-11 DIAGNOSIS — I10 Essential (primary) hypertension: Secondary | ICD-10-CM | POA: Diagnosis not present

## 2019-08-11 DIAGNOSIS — K529 Noninfective gastroenteritis and colitis, unspecified: Secondary | ICD-10-CM | POA: Diagnosis present

## 2019-08-11 DIAGNOSIS — Z791 Long term (current) use of non-steroidal anti-inflammatories (NSAID): Secondary | ICD-10-CM | POA: Insufficient documentation

## 2019-08-11 DIAGNOSIS — I700 Atherosclerosis of aorta: Secondary | ICD-10-CM | POA: Insufficient documentation

## 2019-08-11 DIAGNOSIS — K5900 Constipation, unspecified: Secondary | ICD-10-CM | POA: Diagnosis not present

## 2019-08-11 DIAGNOSIS — Z86711 Personal history of pulmonary embolism: Secondary | ICD-10-CM | POA: Insufficient documentation

## 2019-08-11 DIAGNOSIS — K219 Gastro-esophageal reflux disease without esophagitis: Secondary | ICD-10-CM | POA: Insufficient documentation

## 2019-08-11 DIAGNOSIS — Z87891 Personal history of nicotine dependence: Secondary | ICD-10-CM | POA: Insufficient documentation

## 2019-08-11 DIAGNOSIS — E119 Type 2 diabetes mellitus without complications: Secondary | ICD-10-CM | POA: Diagnosis not present

## 2019-08-11 DIAGNOSIS — J45909 Unspecified asthma, uncomplicated: Secondary | ICD-10-CM | POA: Insufficient documentation

## 2019-08-11 DIAGNOSIS — Z8614 Personal history of Methicillin resistant Staphylococcus aureus infection: Secondary | ICD-10-CM | POA: Insufficient documentation

## 2019-08-11 DIAGNOSIS — M1990 Unspecified osteoarthritis, unspecified site: Secondary | ICD-10-CM | POA: Insufficient documentation

## 2019-08-11 DIAGNOSIS — F329 Major depressive disorder, single episode, unspecified: Secondary | ICD-10-CM | POA: Diagnosis not present

## 2019-08-11 DIAGNOSIS — Z87442 Personal history of urinary calculi: Secondary | ICD-10-CM | POA: Diagnosis not present

## 2019-08-11 DIAGNOSIS — K760 Fatty (change of) liver, not elsewhere classified: Secondary | ICD-10-CM | POA: Insufficient documentation

## 2019-08-11 DIAGNOSIS — N200 Calculus of kidney: Secondary | ICD-10-CM | POA: Insufficient documentation

## 2019-08-11 LAB — LIPASE, BLOOD: Lipase: 63 U/L — ABNORMAL HIGH (ref 11–51)

## 2019-08-11 LAB — URINALYSIS, COMPLETE (UACMP) WITH MICROSCOPIC
Bacteria, UA: NONE SEEN
Bilirubin Urine: NEGATIVE
Glucose, UA: NEGATIVE mg/dL
Hgb urine dipstick: NEGATIVE
Ketones, ur: NEGATIVE mg/dL
Nitrite: NEGATIVE
Protein, ur: NEGATIVE mg/dL
Specific Gravity, Urine: 1.031 — ABNORMAL HIGH (ref 1.005–1.030)
pH: 7 (ref 5.0–8.0)

## 2019-08-11 LAB — CBC
HCT: 42.9 % (ref 36.0–46.0)
Hemoglobin: 14.2 g/dL (ref 12.0–15.0)
MCH: 30.8 pg (ref 26.0–34.0)
MCHC: 33.1 g/dL (ref 30.0–36.0)
MCV: 93.1 fL (ref 80.0–100.0)
Platelets: 294 10*3/uL (ref 150–400)
RBC: 4.61 MIL/uL (ref 3.87–5.11)
RDW: 12.5 % (ref 11.5–15.5)
WBC: 11.2 10*3/uL — ABNORMAL HIGH (ref 4.0–10.5)
nRBC: 0 % (ref 0.0–0.2)

## 2019-08-11 LAB — TROPONIN I (HIGH SENSITIVITY)
Troponin I (High Sensitivity): 5 ng/L (ref <18)
Troponin I (High Sensitivity): 5 ng/L (ref <18)

## 2019-08-11 LAB — SARS CORONAVIRUS 2 (TAT 6-24 HRS): SARS Coronavirus 2: UNDETERMINED — AB

## 2019-08-11 LAB — GLUCOSE, CAPILLARY
Glucose-Capillary: 196 mg/dL — ABNORMAL HIGH (ref 70–99)
Glucose-Capillary: 133 mg/dL — ABNORMAL HIGH (ref 70–99)
Glucose-Capillary: 164 mg/dL — ABNORMAL HIGH (ref 70–99)

## 2019-08-11 LAB — COMPREHENSIVE METABOLIC PANEL
ALT: 32 U/L (ref 0–44)
AST: 22 U/L (ref 15–41)
Albumin: 4.1 g/dL (ref 3.5–5.0)
Alkaline Phosphatase: 120 U/L (ref 38–126)
Anion gap: 12 (ref 5–15)
BUN: 13 mg/dL (ref 8–23)
CO2: 23 mmol/L (ref 22–32)
Calcium: 9.6 mg/dL (ref 8.9–10.3)
Chloride: 105 mmol/L (ref 98–111)
Creatinine, Ser: 0.75 mg/dL (ref 0.44–1.00)
GFR calc Af Amer: >60 mL/min (ref >60)
GFR calc non Af Amer: >60 mL/min (ref >60)
Glucose, Bld: 204 mg/dL — ABNORMAL HIGH (ref 70–99)
Potassium: 3.5 mmol/L (ref 3.5–5.1)
Sodium: 140 mmol/L (ref 135–145)
Total Bilirubin: 0.3 mg/dL (ref 0.3–1.2)
Total Protein: 7.2 g/dL (ref 6.5–8.1)

## 2019-08-11 LAB — LACTIC ACID, PLASMA
Lactic Acid, Venous: 2.8 mmol/L (ref 0.5–1.9)
Lactic Acid, Venous: 2 mmol/L (ref 0.5–1.9)
Lactic Acid, Venous: 1.6 mmol/L (ref 0.5–1.9)

## 2019-08-11 MED ORDER — ACETAMINOPHEN 325 MG PO TABS: 650.0000 mg | ORAL_TABLET | Freq: Four times a day (QID) | ORAL | Status: DC | PRN

## 2019-08-11 MED ORDER — DICYCLOMINE HCL 10 MG PO CAPS
20.0000 mg | ORAL_CAPSULE | Freq: Once | ORAL | Status: AC
  Administered 2019-08-11: 20 mg via ORAL
  Filled 2019-08-11: qty 2

## 2019-08-11 MED ORDER — ONDANSETRON HCL 4 MG/2ML IJ SOLN: 4.0000 mg | Freq: Four times a day (QID) | INTRAMUSCULAR | Status: DC | PRN

## 2019-08-11 MED ORDER — MORPHINE SULFATE (PF) 4 MG/ML IV SOLN
4.0000 mg | Freq: Once | INTRAVENOUS | Status: AC
  Administered 2019-08-11: 4 mg via INTRAVENOUS
  Filled 2019-08-11: qty 1

## 2019-08-11 MED ORDER — IOHEXOL 300 MG/ML  SOLN
100.0000 mL | Freq: Once | INTRAMUSCULAR | Status: AC | PRN
  Administered 2019-08-11: 100 mL via INTRAVENOUS

## 2019-08-11 MED ORDER — ALBUTEROL SULFATE (2.5 MG/3ML) 0.083% IN NEBU: 2.5000 mg | INHALATION_SOLUTION | RESPIRATORY_TRACT | Status: DC | PRN

## 2019-08-11 MED ORDER — METRONIDAZOLE 500 MG PO TABS
500.0000 mg | ORAL_TABLET | Freq: Once | ORAL | Status: AC
  Administered 2019-08-11: 500 mg via ORAL
  Filled 2019-08-11: qty 1

## 2019-08-11 MED ORDER — BISACODYL 10 MG RE SUPP
10.0000 mg | Freq: Once | RECTAL | Status: AC
  Administered 2019-08-11: 14:00:00 10 mg via RECTAL
  Filled 2019-08-11 (×2): qty 1

## 2019-08-11 MED ORDER — LISINOPRIL 10 MG PO TABS
20.0000 mg | ORAL_TABLET | Freq: Every day | ORAL | Status: DC
  Administered 2019-08-12: 08:00:00 20 mg via ORAL
  Filled 2019-08-11: qty 2

## 2019-08-11 MED ORDER — ATOMOXETINE HCL 10 MG PO CAPS
80.0000 mg | ORAL_CAPSULE | ORAL | Status: DC
  Filled 2019-08-11: qty 8

## 2019-08-11 MED ORDER — INSULIN ASPART 100 UNIT/ML ~~LOC~~ SOLN: 0.0000 [IU] | Freq: Every day | SUBCUTANEOUS | Status: DC

## 2019-08-11 MED ORDER — ATORVASTATIN CALCIUM 20 MG PO TABS: 20.0000 mg | ORAL_TABLET | ORAL | Status: DC

## 2019-08-11 MED ORDER — SODIUM CHLORIDE 0.9 % IV BOLUS
1000.0000 mL | Freq: Once | INTRAVENOUS | Status: AC
  Administered 2019-08-11: 1000 mL via INTRAVENOUS

## 2019-08-11 MED ORDER — ESCITALOPRAM OXALATE 10 MG PO TABS
20.0000 mg | ORAL_TABLET | ORAL | Status: DC
  Filled 2019-08-11: qty 2

## 2019-08-11 MED ORDER — LUBIPROSTONE 8 MCG PO CAPS
8.0000 ug | ORAL_CAPSULE | Freq: Two times a day (BID) | ORAL | Status: DC
  Administered 2019-08-11 – 2019-08-12 (×2): 8 ug via ORAL
  Filled 2019-08-11 (×3): qty 1

## 2019-08-11 MED ORDER — CIPROFLOXACIN IN D5W 400 MG/200ML IV SOLN
400.0000 mg | Freq: Once | INTRAVENOUS | Status: DC
  Administered 2019-08-11: 400 mg via INTRAVENOUS
  Filled 2019-08-11: qty 200

## 2019-08-11 MED ORDER — OXYCODONE HCL 5 MG PO TABS
5.0000 mg | ORAL_TABLET | ORAL | Status: DC | PRN
  Administered 2019-08-11: 18:00:00 5 mg via ORAL
  Filled 2019-08-11: qty 1

## 2019-08-11 MED ORDER — PRAMIPEXOLE DIHYDROCHLORIDE 0.25 MG PO TABS
0.5000 mg | ORAL_TABLET | Freq: Every day | ORAL | Status: DC
  Administered 2019-08-11: 22:00:00 0.5 mg via ORAL
  Filled 2019-08-11: qty 2

## 2019-08-11 MED ORDER — ONDANSETRON HCL 4 MG/2ML IJ SOLN
4.0000 mg | Freq: Once | INTRAMUSCULAR | Status: AC
  Administered 2019-08-11: 4 mg via INTRAVENOUS
  Filled 2019-08-11: qty 2

## 2019-08-11 MED ORDER — POLYETHYLENE GLYCOL 3350 17 G PO PACK
17.0000 g | PACK | Freq: Two times a day (BID) | ORAL | Status: AC
  Administered 2019-08-11 – 2019-08-12 (×3): 17 g via ORAL
  Filled 2019-08-11 (×3): qty 1

## 2019-08-11 MED ORDER — FLEET ENEMA 7-19 GM/118ML RE ENEM: 1.0000 | ENEMA | Freq: Once | RECTAL | Status: DC | PRN

## 2019-08-11 MED ORDER — ENOXAPARIN SODIUM 40 MG/0.4ML ~~LOC~~ SOLN: 40.0000 mg | SUBCUTANEOUS | Status: DC

## 2019-08-11 MED ORDER — LEVOTHYROXINE SODIUM 50 MCG PO TABS: 100.0000 ug | ORAL_TABLET | Freq: Every day | ORAL | Status: DC

## 2019-08-11 MED ORDER — INSULIN ASPART 100 UNIT/ML ~~LOC~~ SOLN
0.0000 [IU] | Freq: Three times a day (TID) | SUBCUTANEOUS | Status: DC
  Administered 2019-08-11 – 2019-08-12 (×2): 1 [IU] via SUBCUTANEOUS
  Filled 2019-08-11 (×2): qty 1

## 2019-08-11 MED ORDER — ACETAMINOPHEN 650 MG RE SUPP: 650.0000 mg | Freq: Four times a day (QID) | RECTAL | Status: DC | PRN

## 2019-08-11 MED ORDER — ONDANSETRON HCL 4 MG PO TABS: 4.0000 mg | ORAL_TABLET | Freq: Four times a day (QID) | ORAL | Status: DC | PRN

## 2019-08-11 NOTE — ED Notes (Signed)
Pt given water for PO challenge 

## 2019-08-11 NOTE — ED Triage Notes (Signed)
Pt to ER c/o severe and sudden onset of lower abdominal pain that started this AM, caused her to becomes diaphoretic and have near syncopal episode. Pt also reports chest discomfort to left side of chest that started with abdominal pain. Pt alert and oriented X4, cooperative, RR even and unlabored, color WNL. Pt in NAD.

## 2019-08-11 NOTE — H&P (Signed)
[facility] [place] NAME: Courtney Murphy    MR#:  [id number]  DATE OF BIRTH:  1956/07/07  DATE OF ADMISSION:  08/11/2019  PRIMARY CARE PHYSICIAN: Audelia Acton, MD   REQUESTING/REFERRING PHYSICIAN: Dr. Myrene Buddy  CHIEF COMPLAINT:   Chief Complaint  Patient presents with  . Abdominal Pain    HISTORY OF PRESENT ILLNESS:  Courtney Murphy  is a 63 y.o. female with a known history of diabetes mellitus, hypertension, hypothyroidism, asthma, chronic constipation presented to the hospital complaining of abdominal distention and pain since 5 AM.  Here in the emergency room patient was found to have mildly elevated lactic acid.  CT scan of the abdomen pelvis showed fluid-filled bowel.  No obstruction.  Possible ileus.  Patient feels better at this time after passing significant amount of gas.  Pain is improved.  She received 1 dose of IV ciprofloxacin. Patient's last bowel movement was 5 days back.  She was recently on Percocet for ankle pain.  PAST MEDICAL HISTORY:   Past Medical History:  Diagnosis Date  . Acid reflux 10/11/2015  . Anemia    RECEIVES IRON INFUSIONS  . Arthritis   . Asthma    WELL CONTROLLED  . Depression   . Diabetes mellitus without complication ([place])   . Fatty liver   . Gout   . History of kidney stones   . History of methicillin resistant staphylococcus aureus (MRSA) 2016  . Hypertension    H/O BEEN OFF BP MEDS FOR 1 YEAR  . Hypothyroidism   . Kidney stone   . Knee pain    Left  . Pulmonary emboli ([place]) 2011  . Restless leg syndrome   . Shoulder pain     PAST SURGICAL HISTORY:   Past Surgical History:  Procedure Laterality Date  . ABDOMINAL HYSTERECTOMY    . BACK SURGERY    . BREAST REDUCTION SURGERY    . CATARACT EXTRACTION W/PHACO Left 07/28/2017   Procedure: CATARACT EXTRACTION PHACO AND INTRAOCULAR LENS PLACEMENT ([place]) diabetic Left;  Surgeon: Leandrew Koyanagi, MD;  Location: [facility];   Service: Ophthalmology;  Laterality: Left;  Diabetic  . CESAREAN SECTION  1990  . CHOLECYSTECTOMY    . CYSTOSCOPY/URETEROSCOPY/HOLMIUM LASER/STENT PLACEMENT Left 09/08/2017   Procedure: CYSTOSCOPY/URETEROSCOPY/HOLMIUM LASER/STENT EXCHANGE;  Surgeon: Hollice Espy, MD;  Location: [facility];  Service: Urology;  Laterality: Left;  . CYSTOSCOPY/URETEROSCOPY/HOLMIUM LASER/STENT PLACEMENT Left 12/14/2018   Procedure: CYSTOSCOPY/URETEROSCOPY/HOLMIUM LASER/STENT PLACEMENT;  Surgeon: Hollice Espy, MD;  Location: [facility];  Service: Urology;  Laterality: Left;  . FOOT SURGERY    . GASTRIC BYPASS    . IR NEPHROSTOMY PLACEMENT LEFT  08/17/2017  . KNEE ARTHROSCOPY Left   . KNEE ARTHROSCOPY Left 07/19/2015   Procedure: ARTHROSCOPY KNEE;  Surgeon: Leanor Kail, MD;  Location: [facility];  Service: Orthopedics;  Laterality: Left;  . NEPHROLITHOTOMY Left 08/17/2017   Procedure: NEPHROLITHOTOMY PERCUTANEOUS WITH HOLMIUM LASER;  Surgeon: Hollice Espy, MD;  Location: [facility];  Service: Urology;  Laterality: Left;  . THUMB ARTHROSCOPY    . TONSILLECTOMY      SOCIAL HISTORY:   Social History   Tobacco Use  . Smoking status: Former Smoker    Packs/day: 0.50    Years: 4.00    Pack years: 2.00    Types: Cigarettes    Quit date: 04/14/2015    Years since quitting: 4.3  . Smokeless tobacco: Never Used  Substance Use Topics  . Alcohol use: Yes  Comment: RARE    FAMILY HISTORY:   Family History  Problem Relation Age of Onset  . Cancer Mother        breast  . Heart disease Father   . Diabetes Father   . Hypertension Father   . Cancer Maternal Grandmother   . Cancer Maternal Grandfather   . Heart disease Paternal Grandmother   . Heart disease Paternal Grandfather   . Cancer Maternal Aunt   . Cancer Maternal Uncle   . Heart disease Paternal Aunt   . Heart disease Paternal Uncle   . Kidney cancer Neg Hx   . Bladder Cancer Neg Hx     DRUG ALLERGIES:   Allergies  Allergen Reactions   . Sulfasalazine Hives  . 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 Sulfa Antibiotics Hives  . Penicillins Hives and Rash    Has patient had a PCN reaction causing immediate rash, facial/tongue/throat swelling, SOB or lightheadedness with hypotension: No Has patient had a PCN reaction causing severe rash involving mucus membranes or skin necrosis: No Has patient had a PCN reaction that required hospitalization: No Has patient had a PCN reaction occurring within the last 10 years: No If all of the above answers are "NO", then may proceed with Cephalosporin use.     REVIEW OF SYSTEMS:   Review of Systems  Constitutional: Positive for malaise/fatigue. Negative for chills and fever.  HENT: Negative for sore throat.   Eyes: Negative for blurred vision, double vision and pain.  Respiratory: Negative for cough, hemoptysis, shortness of breath and wheezing.   Cardiovascular: Negative for chest pain, palpitations, orthopnea and leg swelling.  Gastrointestinal: Positive for abdominal pain, constipation and nausea. Negative for diarrhea, heartburn and vomiting.  Genitourinary: Negative for dysuria and hematuria.  Musculoskeletal: Negative for back pain and joint pain.  Skin: Negative for rash.  Neurological: Negative for sensory change, speech change, focal weakness and headaches.  Endo/Heme/Allergies: Does not bruise/bleed easily.  Psychiatric/Behavioral: Negative for depression. The patient is not nervous/anxious.     MEDICATIONS AT HOME:   Prior to Admission medications   Medication Sig Start Date End Date Taking? Authorizing Provider  AMITIZA 8 MCG capsule Take 8 mcg by mouth 2 (two) times daily with a meal.  06/26/19  Yes [provider]  atomoxetine (STRATTERA) 80 MG capsule Take 1 capsule (80 mg total) by mouth daily. Patient taking differently: Take 80 mg by mouth every morning.  10/17/18 08/11/19 Yes Cannady, Henrine Screws T, NP  conjugated estrogens (PREMARIN) vaginal cream  Place 1 Applicatorful vaginally daily. Use pea sized amount M-W-Fr before bedtime 01/12/19  Yes Hollice Espy, MD  Dulaglutide (TRULICITY) [id number] SOPN Inject 0.75 mg into the skin every Thursday.    Yes [provider]  levothyroxine (SYNTHROID, LEVOTHROID) 100 MCG tablet Take 100 mcg by mouth daily before breakfast.   Yes [provider]  lisinopril (ZESTRIL) 20 MG tablet Take 20 mg by mouth daily. 04/25/19  Yes [provider]  pramipexole (MIRAPEX) 0.5 MG tablet Take 0.5 mg by mouth at bedtime. May take an additional 0.5 mg for restless legs   Yes [provider]  aspirin EC 81 MG EC tablet Take 1 tablet (81 mg total) by mouth daily. 01/14/19   Loletha Grayer, MD  atorvastatin (LIPITOR) 20 MG tablet Take 20 mg by mouth every morning.  06/27/18   [provider]  cyanocobalamin (,VITAMIN B-12,) 1000 MCG/ML injection Inject 1,000 mcg into the muscle every 7 (  seven) days.  06/16/18   [provider]  diclofenac sodium (VOLTAREN) 1 % GEL Apply 2 g topically 4 (four) times daily. Rub into affected area of foot 2 to 4 times daily 02/28/19   Trula Slade, DPM  escitalopram (LEXAPRO) 10 MG tablet Take 20 mg by mouth every morning.  09/13/18   [provider]  gabapentin (NEURONTIN) 300 MG capsule Take 300 mg by mouth at bedtime as needed (neuropathy).     [provider]  HYDROcodone-acetaminophen (NORCO) 10-325 MG tablet Take 1 tablet by mouth every 8 (eight) hours as needed. Patient not taking: Reported on 08/11/2019 03/09/19   Tyson Dense T, DPM  LINZESS 145 MCG CAPS capsule Take 1 capsule by mouth daily. 01/13/19   [provider]  methylPREDNISolone (MEDROL DOSEPAK) 4 MG TBPK tablet 6 day dose pack - take as directed Patient not taking: Reported on 08/11/2019 07/26/19   Hyatt, Max T, DPM  metoprolol succinate (TOPROL XL) 25 MG 24 hr tablet Take 1 tablet (25 mg total) by mouth at bedtime. Patient not taking: Reported  on 08/11/2019 01/14/19   Loletha Grayer, MD  nitrofurantoin, macrocrystal-monohydrate, (MACROBID) 100 MG capsule Take 1 capsule (100 mg total) by mouth 2 (two) times daily. Patient not taking: Reported on 08/11/2019 06/09/19   Norval Gable, MD  oxyCODONE-acetaminophen (PERCOCET) 10-325 MG tablet Take 1 tablet by mouth every 8 (eight) hours as needed for up to 7 days for pain. 08/10/19 08/17/19  Hyatt, Max T, DPM  tiZANidine (ZANAFLEX) 4 MG tablet TK 1 T PO Q 8 H PRN 06/25/19   [provider]     VITAL SIGNS:  Blood pressure (!) 142/80, pulse 74, temperature 98.3 F (36.8 C), temperature source Oral, resp. rate 11, height 5\' 4"  (1.626 m), weight 77.1 kg, SpO2 100 %.  PHYSICAL EXAMINATION:  Physical Exam  GENERAL:  63 y.o.-year-old patient lying in the bed with no acute distress.  EYES: Pupils equal, round, reactive to light and accommodation. No scleral icterus. Extraocular muscles intact.  HEENT: Head atraumatic, normocephalic. Oropharynx and nasopharynx clear. No oropharyngeal erythema, moist oral mucosa  NECK:  Supple, no jugular venous distention. No thyroid enlargement, no tenderness.  LUNGS: Normal breath sounds bilaterally, no wheezing, rales, rhonchi. No use of accessory muscles of respiration.  CARDIOVASCULAR: S1, S2 normal. No murmurs, rubs, or gallops.  ABDOMEN: Soft, lower abd tenderness, nondistended. Bowel sounds present. No organomegaly or mass.  EXTREMITIES: No pedal edema, cyanosis, or clubbing. + 2 pedal & radial pulses b/l.   NEUROLOGIC: Cranial nerves II through XII are intact. No focal Motor or sensory deficits appreciated b/l PSYCHIATRIC: The patient is alert and oriented x 3. Good affect.  SKIN: No obvious rash, lesion, or ulcer.   LABORATORY PANEL:   CBC Recent Labs  Lab 08/11/19 0726  WBC 11.2*  HGB 14.2  HCT 42.9  PLT 294    ------------------------------------------------------------------------------------------------------------------  Chemistries  Recent Labs  Lab 08/11/19 0726  NA 140  K 3.5  CL 105  CO2 23  GLUCOSE 204*  BUN 13  CREATININE 0.75  CALCIUM 9.6  AST 22  ALT 32  ALKPHOS 120  BILITOT 0.3   ------------------------------------------------------------------------------------------------------------------  Cardiac Enzymes No results for input(s): TROPONINI in the last 168 hours. ------------------------------------------------------------------------------------------------------------------  RADIOLOGY:  Dg Chest 2 View  Result Date: 08/11/2019 CLINICAL DATA:  Chest pain. EXAM: CHEST - 2 VIEW COMPARISON:  Radiographs of June 20, 2019. FINDINGS: The heart size and mediastinal contours are within normal limits. Both  lungs are clear. No pneumothorax or pleural effusion is noted. The visualized skeletal structures are unremarkable. IMPRESSION: No active cardiopulmonary disease. Electronically Signed   By: Marijo Conception M.D.   On: 08/11/2019 08:10   Ct Abdomen Pelvis W Contrast  Result Date: 08/11/2019 CLINICAL DATA:  Abdominal distention with nausea and vomiting EXAM: CT ABDOMEN AND PELVIS WITH CONTRAST TECHNIQUE: Multidetector CT imaging of the abdomen and pelvis was performed using the standard protocol following bolus administration of intravenous contrast. CONTRAST:  112mL OMNIPAQUE IOHEXOL 300 MG/ML  SOLN COMPARISON:  August 31, 2017 FINDINGS: Lower chest: There is slight bibasilar atelectasis. There is no lung base edema or consolidation. Hepatobiliary: No focal liver lesions are appreciable. Gallbladder is absent. There is intrahepatic biliary duct dilatation. The common bile duct measures just over 7 mm which may be within normal limits for post cholecystectomy state. No biliary duct mass or calculus is demonstrable by CT. Pancreas: There is no pancreatic mass or inflammatory  focus. Spleen: No splenic lesions are demonstrable. Adrenals/Urinary Tract: Adrenals bilaterally appear unremarkable. Kidneys bilaterally show no evident mass or hydronephrosis on either side. There is a 2 mm calculus with an adjacent 1 mm calculus in the upper pole the right kidney. There is a 6 x 5 mm calculus with an adjacent 4 x 3 mm calculus in the lower pole left kidney. There is also a 5 x 5 mm calculus in the lower pole left kidney. There is no evident ureteral calculus on either side. Urinary bladder is midline with wall thickness within normal limits. Stomach/Bowel: Patient is status post gastric bypass procedure. There is no appreciable fluid or wall thickening in the postoperative regions. There is fluid throughout most small bowel loops. There is no appreciable bowel wall thickening or dilatation. There is no evident transition zone to suggest bowel obstruction. No free air or portal venous air evident. Terminal ileum appears unremarkable. Vascular/Lymphatic: There is no abdominal aortic aneurysm. There are foci of aortic atherosclerosis. There is no adenopathy in the abdomen or pelvis. Reproductive: Uterus is absent.  There is no evident pelvic mass. Other: The appendix appears normal. There is no evident abscess or ascites in the abdomen or pelvis. Musculoskeletal: There is degenerative change in the lower thoracic and lumbar regions. No blastic or lytic bone lesions evident. No intramuscular or abdominal wall lesions evident. IMPRESSION: 1. Most small bowel loops are fluid-filled. This is a finding that may be indicative of a degree of enteritis or ileus. There is no demonstrable bowel obstruction. 2. Status post gastric bypass procedure without complicating features demonstrable. 3. Calculi in lower pole left kidney, nonobstructing. No hydronephrosis or ureteral calculus on either side. Urinary bladder wall thickness normal. 4. Gallbladder absent. Intrahepatic biliary duct dilatation noted, a  finding that may be due to post cholecystectomy state. Common bile duct appears within normal limits for post cholecystectomy state. No obstructing biliary lesion appreciable by CT. 5.  Aortic Atherosclerosis (ICD10-I70.0). 6.  Uterus absent. Electronically Signed   By: Lowella Grip III M.D.   On: 08/11/2019 10:01     IMPRESSION AND PLAN:   *Constipation with possible ileus.  Patient feels better since arrival in the emergency room.  Will not continue IV antibiotics at this time.  Lactic acid trending down. Dulcolax suppository.  Will add oral MiraLAX.  *Diabetes mellitus.  Sliding scale insulin ordered.  Diabetic diet  *Hypertension.  Will continue home medications once available.  *Asthma.  Inhalers as needed.  *DVT prophylaxis with Lovenox  All  the records are reviewed and case discussed with ED provider. Management plans discussed with the patient, family and they are in agreement.  CODE STATUS: FULL CODE  TOTAL TIME TAKING CARE OF THIS PATIENT: 40 minutes.   Neita Carp M.D on 08/11/2019 at 12:27 PM  Between 7am to 6pm - Pager - [phone]  After 6pm go to [email or web address] - password [email or web address] [place] [facility]  Office  [phone]  CC: Primary care physician; Audelia Acton, MD  Note: This dictation was prepared with Dragon dictation along with smaller phrase technology. Any transcriptional errors that result from this process are unintentional.

## 2019-08-11 NOTE — ED Notes (Signed)
Dr. Myrene Buddy notified of pt's lactic acid of 2.8. no new orders at this time.

## 2019-08-11 NOTE — ED Notes (Signed)
Patient transported to CT 

## 2019-08-11 NOTE — ED Notes (Signed)
Pt ambulated to the toilet, NAD noted, denies dizziness.

## 2019-08-11 NOTE — ED Provider Notes (Addendum)
[facility] Emergency Department Provider Note  ____________________________________________   First MD Initiated Contact with Patient 08/11/19 0800     (approximate)  I have reviewed the triage vital signs and the nursing notes.   HISTORY  Chief Complaint Abdominal Pain    HPI Courtney Murphy is a 63 y.o. female  With PMHx below here with abdominal pain. Pt reports that she had some mild "upset stomach" throughout the day yesterday but was o/w feeling well when she went to bed. She woke up early this AM and felt okay btu then experienced acute onset of diffuse but primarily bilateral lower abd pain, along with nausea and sensation she needed to have a bM. She went to the toilet and had a soft BM, with associated diffuse abd pain. She had associated lightheadedness, dizziness, and a sensation of cold sweats like she was going to pass out. She has since felt generally very weak and tired. She feels "drained." She has also developed a mild, aching, dull, substernal chest pressure with this, which is common for her and has been diagnosed as angina. Primary issue, however, is her abd pain and weakness. No alleviating factors. No known fevers. No known sick contacts.       Past Medical History:  Diagnosis Date   Acid reflux 10/11/2015   Anemia    RECEIVES IRON INFUSIONS   Arthritis    Asthma    WELL CONTROLLED   Depression    Diabetes mellitus without complication ([place])    Fatty liver    Gout    History of kidney stones    History of methicillin resistant staphylococcus aureus (MRSA) 2016   Hypertension    H/O BEEN OFF BP MEDS FOR 1 YEAR   Hypothyroidism    Kidney stone    Knee pain    Left   Pulmonary emboli ([place]) 2011   Restless leg syndrome    Shoulder pain     Patient Active Problem List   Diagnosis Date Noted   Unstable angina ([place]) 01/14/2019   Kidney stone 08/17/2017   Type 2 diabetes mellitus without complication,  without long-term current use of insulin ([place]) 04/22/2017   Chest pain 09/30/2016   Acid reflux 10/11/2015   Adult hypothyroidism 10/11/2015   ADHD (attention deficit hyperactivity disorder) 10/11/2015   Bariatric surgery status 10/22/2014   HLD (hyperlipidemia) 08/18/2013    Past Surgical History:  Procedure Laterality Date   ABDOMINAL HYSTERECTOMY     BACK SURGERY     BREAST REDUCTION SURGERY     CATARACT EXTRACTION W/PHACO Left 07/28/2017   Procedure: CATARACT EXTRACTION PHACO AND INTRAOCULAR LENS PLACEMENT ([place]) diabetic Left;  Surgeon: Leandrew Koyanagi, MD;  Location: [facility];  Service: Ophthalmology;  Laterality: Left;  Diabetic   CESAREAN SECTION  1990   CHOLECYSTECTOMY     CYSTOSCOPY/URETEROSCOPY/HOLMIUM LASER/STENT PLACEMENT Left 09/08/2017   Procedure: CYSTOSCOPY/URETEROSCOPY/HOLMIUM LASER/STENT EXCHANGE;  Surgeon: Hollice Espy, MD;  Location: [facility];  Service: Urology;  Laterality: Left;   CYSTOSCOPY/URETEROSCOPY/HOLMIUM LASER/STENT PLACEMENT Left 12/14/2018   Procedure: CYSTOSCOPY/URETEROSCOPY/HOLMIUM LASER/STENT PLACEMENT;  Surgeon: Hollice Espy, MD;  Location: [facility];  Service: Urology;  Laterality: Left;   FOOT SURGERY     GASTRIC BYPASS     IR NEPHROSTOMY PLACEMENT LEFT  08/17/2017   KNEE ARTHROSCOPY Left    KNEE ARTHROSCOPY Left 07/19/2015   Procedure: ARTHROSCOPY KNEE;  Surgeon: Leanor Kail, MD;  Location: [facility];  Service: Orthopedics;  Laterality: Left;   NEPHROLITHOTOMY Left 08/17/2017  Procedure: NEPHROLITHOTOMY PERCUTANEOUS WITH HOLMIUM LASER;  Surgeon: Hollice Espy, MD;  Location: [facility];  Service: Urology;  Laterality: Left;   THUMB ARTHROSCOPY     TONSILLECTOMY      Prior to Admission medications   Medication Sig Start Date End Date Taking? Authorizing Provider  AMITIZA 8 MCG capsule  06/26/19   [provider]  aspirin EC 81 MG EC tablet Take 1 tablet (81 mg total) by mouth daily. 01/14/19    Loletha Grayer, MD  atomoxetine (STRATTERA) 80 MG capsule Take 1 capsule (80 mg total) by mouth daily. Patient taking differently: Take 80 mg by mouth every morning.  10/17/18 06/09/19  Marnee Guarneri T, NP  atorvastatin (LIPITOR) 20 MG tablet Take 20 mg by mouth every morning.  06/27/18   [provider]  conjugated estrogens (PREMARIN) vaginal cream Place 1 Applicatorful vaginally daily. Use pea sized amount M-W-Fr before bedtime 01/12/19   Hollice Espy, MD  cyanocobalamin (,VITAMIN B-12,) 1000 MCG/ML injection Inject 1,000 mcg into the muscle every 7 (seven) days.  06/16/18   [provider]  diclofenac sodium (VOLTAREN) 1 % GEL Apply 2 g topically 4 (four) times daily. Rub into affected area of foot 2 to 4 times daily 02/28/19   Trula Slade, DPM  Dulaglutide (TRULICITY) [id number] SOPN Inject 0.75 mg into the skin every Thursday.     [provider]  escitalopram (LEXAPRO) 10 MG tablet Take 20 mg by mouth every morning.  09/13/18   [provider]  gabapentin (NEURONTIN) 300 MG capsule Take 300 mg by mouth at bedtime as needed (neuropathy).     [provider]  HYDROcodone-acetaminophen (NORCO) 10-325 MG tablet Take 1 tablet by mouth every 8 (eight) hours as needed. 03/09/19   Hyatt, Max T, DPM  levothyroxine (SYNTHROID, LEVOTHROID) 100 MCG tablet Take 100 mcg by mouth daily before breakfast.    [provider]  LINZESS 145 MCG CAPS capsule Take 1 capsule by mouth daily. 01/13/19   [provider]  lisinopril (ZESTRIL) 20 MG tablet Take 20 mg by mouth daily. 04/25/19   [provider]  methylPREDNISolone (MEDROL DOSEPAK) 4 MG TBPK tablet 6 day dose pack - take as directed 07/26/19   Hyatt, Max T, DPM  metoprolol succinate (TOPROL XL) 25 MG 24 hr tablet Take 1 tablet (25 mg total) by mouth at bedtime. 01/14/19   Loletha Grayer, MD  nitrofurantoin, macrocrystal-monohydrate, (MACROBID) 100 MG capsule Take 1 capsule (100  mg total) by mouth 2 (two) times daily. 06/09/19   Norval Gable, MD  oxyCODONE-acetaminophen (PERCOCET) 10-325 MG tablet Take 1 tablet by mouth every 8 (eight) hours as needed for up to 7 days for pain. 08/10/19 08/17/19  Hyatt, Max T, DPM  pramipexole (MIRAPEX) 0.5 MG tablet Take 0.5 mg by mouth at bedtime. May take an additional 0.5 mg for restless legs    [provider]  tiZANidine (ZANAFLEX) 4 MG tablet TK 1 T PO Q 8 H PRN 06/25/19   [provider]    Allergies Sulfasalazine, Nsaids, Sulfa antibiotics, and Penicillins  Family History  Problem Relation Age of Onset   Cancer Mother        breast   Heart disease Father    Diabetes Father    Hypertension Father    Cancer Maternal Grandmother    Cancer Maternal Grandfather    Heart disease Paternal Grandmother    Heart disease Paternal Grandfather    Cancer Maternal Aunt    Cancer  Maternal Uncle    Heart disease Paternal Aunt    Heart disease Paternal Uncle    Kidney cancer Neg Hx    Bladder Cancer Neg Hx     Social History Social History   Tobacco Use   Smoking status: Former Smoker    Packs/day: 0.50    Years: 4.00    Pack years: 2.00    Types: Cigarettes    Quit date: 04/14/2015    Years since quitting: 4.3   Smokeless tobacco: Never Used  Substance Use Topics   Alcohol use: Yes    Comment: RARE   Drug use: No    Review of Systems  Review of Systems  Constitutional: Positive for fatigue. Negative for fever.  HENT: Negative for congestion and sore throat.   Eyes: Negative for visual disturbance.  Respiratory: Negative for cough and shortness of breath.   Cardiovascular: Negative for chest pain.  Gastrointestinal: Positive for abdominal distention, abdominal pain and nausea. Negative for diarrhea and vomiting.  Genitourinary: Negative for flank pain.  Musculoskeletal: Negative for back pain and neck pain.  Skin: Negative for rash and wound.  Neurological: Positive for  weakness and light-headedness.  All other systems reviewed and are negative.    ____________________________________________  PHYSICAL EXAM:      VITAL SIGNS: ED Triage Vitals  Enc Vitals Group     BP 08/11/19 0722 119/64     Pulse Rate 08/11/19 0722 (!) 118     Resp 08/11/19 0722 18     Temp 08/11/19 0722 98.3 F (36.8 C)     Temp Source 08/11/19 0722 Oral     SpO2 08/11/19 0722 99 %     Weight 08/11/19 0723 170 lb (77.1 kg)     Height 08/11/19 0723 5\' 4"  (1.626 m)     Head Circumference --      Peak Flow --      Pain Score 08/11/19 0722 6     Pain Loc --      Pain Edu? --      Excl. in [place]? --      Physical Exam Vitals signs and nursing note reviewed.  Constitutional:      General: She is not in acute distress.    Appearance: She is well-developed.  HENT:     Head: Normocephalic and atraumatic.  Eyes:     Conjunctiva/sclera: Conjunctivae normal.  Neck:     Musculoskeletal: Neck supple.  Cardiovascular:     Rate and Rhythm: Normal rate and regular rhythm.     Heart sounds: Normal heart sounds. No murmur. No friction rub.  Pulmonary:     Effort: Pulmonary effort is normal. No respiratory distress.     Breath sounds: Normal breath sounds. No wheezing or rales.  Abdominal:     General: Bowel sounds are increased. There is no distension.     Palpations: Abdomen is soft.     Tenderness: There is generalized abdominal tenderness.  Skin:    General: Skin is warm.     Capillary Refill: Capillary refill takes less than 2 seconds.  Neurological:     Mental Status: She is alert and oriented to person, place, and time.     Motor: No abnormal muscle tone.       ____________________________________________   LABS (all labs ordered are listed, but only abnormal results are displayed)  Labs Reviewed  CBC - Abnormal; Notable for the following components:      Result Value   WBC 11.2 (*)  All other components within normal limits  GLUCOSE, CAPILLARY - Abnormal;  Notable for the following components:   Glucose-Capillary 196 (*)    All other components within normal limits  LIPASE, BLOOD - Abnormal; Notable for the following components:   Lipase 63 (*)    All other components within normal limits  COMPREHENSIVE METABOLIC PANEL - Abnormal; Notable for the following components:   Glucose, Bld 204 (*)    All other components within normal limits  URINALYSIS, COMPLETE (UACMP) WITH MICROSCOPIC - Abnormal; Notable for the following components:   Color, Urine STRAW (*)    APPearance CLEAR (*)    Specific Gravity, Urine 1.031 (*)    Leukocytes,Ua SMALL (*)    All other components within normal limits  LACTIC ACID, PLASMA - Abnormal; Notable for the following components:   Lactic Acid, Venous 2.8 (*)    All other components within normal limits  LACTIC ACID, PLASMA - Abnormal; Notable for the following components:   Lactic Acid, Venous 2.0 (*)    All other components within normal limits  SARS CORONAVIRUS 2 (TAT 6-24 HRS)  CBG MONITORING, ED  TROPONIN I (HIGH SENSITIVITY)  TROPONIN I (HIGH SENSITIVITY)    ____________________________________________  EKG: sinus tachycardia, VR 113. PR 150, QRS 82, QTc 471. No ischemic changes. ________________________________________  RADIOLOGY All imaging, including plain films, CT scans, and ultrasounds, independently reviewed by me, and interpretations confirmed via formal radiology reads.  ED MD interpretation:   CT: Ileus vs enteritis, no perforation, no overt SBO  Official radiology report(s): Dg Chest 2 View  Result Date: 08/11/2019 CLINICAL DATA:  Chest pain. EXAM: CHEST - 2 VIEW COMPARISON:  Radiographs of June 20, 2019. FINDINGS: The heart size and mediastinal contours are within normal limits. Both lungs are clear. No pneumothorax or pleural effusion is noted. The visualized skeletal structures are unremarkable. IMPRESSION: No active cardiopulmonary disease. Electronically Signed   By: Marijo Conception M.D.   On: 08/11/2019 08:10   Ct Abdomen Pelvis W Contrast  Result Date: 08/11/2019 CLINICAL DATA:  Abdominal distention with nausea and vomiting EXAM: CT ABDOMEN AND PELVIS WITH CONTRAST TECHNIQUE: Multidetector CT imaging of the abdomen and pelvis was performed using the standard protocol following bolus administration of intravenous contrast. CONTRAST:  194mL OMNIPAQUE IOHEXOL 300 MG/ML  SOLN COMPARISON:  August 31, 2017 FINDINGS: Lower chest: There is slight bibasilar atelectasis. There is no lung base edema or consolidation. Hepatobiliary: No focal liver lesions are appreciable. Gallbladder is absent. There is intrahepatic biliary duct dilatation. The common bile duct measures just over 7 mm which may be within normal limits for post cholecystectomy state. No biliary duct mass or calculus is demonstrable by CT. Pancreas: There is no pancreatic mass or inflammatory focus. Spleen: No splenic lesions are demonstrable. Adrenals/Urinary Tract: Adrenals bilaterally appear unremarkable. Kidneys bilaterally show no evident mass or hydronephrosis on either side. There is a 2 mm calculus with an adjacent 1 mm calculus in the upper pole the right kidney. There is a 6 x 5 mm calculus with an adjacent 4 x 3 mm calculus in the lower pole left kidney. There is also a 5 x 5 mm calculus in the lower pole left kidney. There is no evident ureteral calculus on either side. Urinary bladder is midline with wall thickness within normal limits. Stomach/Bowel: Patient is status post gastric bypass procedure. There is no appreciable fluid or wall thickening in the postoperative regions. There is fluid throughout most small bowel loops. There is no  appreciable bowel wall thickening or dilatation. There is no evident transition zone to suggest bowel obstruction. No free air or portal venous air evident. Terminal ileum appears unremarkable. Vascular/Lymphatic: There is no abdominal aortic aneurysm. There are foci of aortic  atherosclerosis. There is no adenopathy in the abdomen or pelvis. Reproductive: Uterus is absent.  There is no evident pelvic mass. Other: The appendix appears normal. There is no evident abscess or ascites in the abdomen or pelvis. Musculoskeletal: There is degenerative change in the lower thoracic and lumbar regions. No blastic or lytic bone lesions evident. No intramuscular or abdominal wall lesions evident. IMPRESSION: 1. Most small bowel loops are fluid-filled. This is a finding that may be indicative of a degree of enteritis or ileus. There is no demonstrable bowel obstruction. 2. Status post gastric bypass procedure without complicating features demonstrable. 3. Calculi in lower pole left kidney, nonobstructing. No hydronephrosis or ureteral calculus on either side. Urinary bladder wall thickness normal. 4. Gallbladder absent. Intrahepatic biliary duct dilatation noted, a finding that may be due to post cholecystectomy state. Common bile duct appears within normal limits for post cholecystectomy state. No obstructing biliary lesion appreciable by CT. 5.  Aortic Atherosclerosis (ICD10-I70.0). 6.  Uterus absent. Electronically Signed   By: Lowella Grip III M.D.   On: 08/11/2019 10:01    ____________________________________________  PROCEDURES   Procedure(s) performed (including Critical Care):  Procedures  ____________________________________________  INITIAL IMPRESSION / MDM / [place] / ED COURSE  As part of my medical decision making, I reviewed the following data within the electronic MEDICAL RECORD NUMBER Notes from prior ED visits and [place] Controlled Substance Database      *KARNESHA SOBOLEWSKI was evaluated in Emergency Department on 08/11/2019 for the symptoms described in the history of present illness. She was evaluated in the context of the global COVID-19 pandemic, which necessitated consideration that the patient might be at risk for infection with the SARS-CoV-2 virus that  causes COVID-19. Institutional protocols and algorithms that pertain to the evaluation of patients at risk for COVID-19 are in a state of rapid change based on information released by regulatory bodies including the CDC and federal and state organizations. These policies and algorithms were followed during the patient's care in the ED.  Some ED evaluations and interventions may be delayed as a result of limited staffing during the pandemic.*   Clinical Course as of Aug 11 1155  Fri Aug 11, 2019  1046 After 1L NS, second liter running. UA is pending.  Lactic Acid, Venous(!!): 2.0 [CI]    Clinical Course User Index [CI] Duffy Bruce, MD    Medical Decision Making: As above. 63 yo F here w/ n/v and abd distension. LA elevated. I suspect this is mostely 2/2 dehydration. However, labs show mild leukocytosis, lactic acidosis, and CT scan c/f enteritis vs ileus. Persistently vomiting w/ abd pain after sx control in ED. Admit.  ____________________________________________  FINAL CLINICAL IMPRESSION(S) / ED DIAGNOSES  Final diagnoses:  Enteritis  Generalized abdominal pain  Lactic acidosis     MEDICATIONS GIVEN DURING THIS VISIT:  Medications  ciprofloxacin (CIPRO) IVPB 400 mg (400 mg Intravenous New Bag/Given 08/11/19 1136)  sodium chloride 0.9 % bolus 1,000 mL (0 mLs Intravenous Stopped 08/11/19 0914)  morphine 4 MG/ML injection 4 mg (4 mg Intravenous Given 08/11/19 0838)  ondansetron (ZOFRAN) injection 4 mg (4 mg Intravenous Given 08/11/19 0836)  iohexol (OMNIPAQUE) 300 MG/ML solution 100 mL (100 mLs Intravenous Contrast Given 08/11/19 0934)  sodium chloride 0.9 % bolus 1,000 mL (0 mLs Intravenous Stopped 08/11/19 1125)  dicyclomine (BENTYL) capsule 20 mg (20 mg Oral Given 08/11/19 1038)  metroNIDAZOLE (FLAGYL) tablet 500 mg (500 mg Oral Given 08/11/19 1126)     ED Discharge Orders    None       Note:  This document was prepared using Dragon voice recognition software and may  include unintentional dictation errors.   Duffy Bruce, MD 08/11/19 1214    Duffy Bruce, MD 08/11/19 1214

## 2019-08-12 LAB — BASIC METABOLIC PANEL
Anion gap: 9 (ref 5–15)
BUN: 10 mg/dL (ref 8–23)
CO2: 30 mmol/L (ref 22–32)
Calcium: 9 mg/dL (ref 8.9–10.3)
Chloride: 105 mmol/L (ref 98–111)
Creatinine, Ser: 0.79 mg/dL (ref 0.44–1.00)
GFR calc Af Amer: >60 mL/min (ref >60)
GFR calc non Af Amer: >60 mL/min (ref >60)
Glucose, Bld: 135 mg/dL — ABNORMAL HIGH (ref 70–99)
Potassium: 4.4 mmol/L (ref 3.5–5.1)
Sodium: 144 mmol/L (ref 135–145)

## 2019-08-12 LAB — GLUCOSE, CAPILLARY: Glucose-Capillary: 121 mg/dL — ABNORMAL HIGH (ref 70–99)

## 2019-08-12 LAB — CBC
HCT: 38.5 % (ref 36.0–46.0)
Hemoglobin: 12.7 g/dL (ref 12.0–15.0)
MCH: 30.8 pg (ref 26.0–34.0)
MCHC: 33 g/dL (ref 30.0–36.0)
MCV: 93.2 fL (ref 80.0–100.0)
Platelets: 233 10*3/uL (ref 150–400)
RBC: 4.13 MIL/uL (ref 3.87–5.11)
RDW: 12.4 % (ref 11.5–15.5)
WBC: 10 10*3/uL (ref 4.0–10.5)
nRBC: 0 % (ref 0.0–0.2)

## 2019-08-12 LAB — HEMOGLOBIN A1C
Hgb A1c MFr Bld: 8.8 % — ABNORMAL HIGH (ref 4.8–5.6)
Mean Plasma Glucose: 206 mg/dL

## 2019-08-12 LAB — HIV ANTIBODY (ROUTINE TESTING W REFLEX): HIV Screen 4th Generation wRfx: NONREACTIVE

## 2019-08-12 MED ORDER — ESCITALOPRAM OXALATE 10 MG PO TABS
20.0000 mg | ORAL_TABLET | Freq: Every day | ORAL | Status: DC
  Administered 2019-08-12: 20 mg via ORAL
  Filled 2019-08-12: qty 2

## 2019-08-12 MED ORDER — DOCUSATE SODIUM 100 MG PO CAPS: 100.0000 mg | ORAL_CAPSULE | Freq: Two times a day (BID) | ORAL | 0 refills | Status: DC

## 2019-08-12 MED ORDER — DULCOLAX 5 MG PO TBEC: 5.0000 mg | DELAYED_RELEASE_TABLET | Freq: Every day | ORAL | 0 refills | Status: DC | PRN

## 2019-08-12 MED ORDER — ATOMOXETINE HCL 40 MG PO CAPS
80.0000 mg | ORAL_CAPSULE | Freq: Every day | ORAL | Status: DC
  Administered 2019-08-12: 80 mg via ORAL
  Filled 2019-08-12: qty 2

## 2019-08-12 MED ORDER — ATOMOXETINE HCL 60 MG PO CAPS
80.0000 mg | ORAL_CAPSULE | Freq: Every day | ORAL | Status: DC
  Filled 2019-08-12: qty 2

## 2019-08-12 MED ORDER — ATORVASTATIN CALCIUM 20 MG PO TABS: 20.0000 mg | ORAL_TABLET | Freq: Every day | ORAL | Status: DC

## 2019-08-12 NOTE — Progress Notes (Signed)
Dr Stark Jock aware of [phone] swab rejection, pt being discharged, no new orders

## 2019-08-12 NOTE — Progress Notes (Signed)
Pt being discharged home, discharge instructions and prescriptions reviewed with pt, states understanding, pt with no complaints 

## 2019-08-12 NOTE — Discharge Summary (Signed)
[facility] NAME: Courtney Murphy    MR#:  [id number]  DATE OF BIRTH:  [birth year]  DATE OF ADMISSION:  08/11/2019   ADMITTING PHYSICIAN: Hillary Bow, MD  DATE OF DISCHARGE: 08/12/2019  PRIMARY CARE PHYSICIAN: Audelia Acton, MD   ADMISSION DIAGNOSIS:  Lactic acidosis [E87.2] Enteritis [K52.9] Generalized abdominal pain [R10.84] DISCHARGE DIAGNOSIS:  Active Problems:   Constipation  SECONDARY DIAGNOSIS:   Past Medical History:  Diagnosis Date  . Acid reflux 10/11/2015  . Anemia    RECEIVES IRON INFUSIONS  . Arthritis   . Asthma    WELL CONTROLLED  . Depression   . Diabetes mellitus without complication ([place])   . Fatty liver   . Gout   . History of kidney stones   . History of methicillin resistant staphylococcus aureus (MRSA) 2016  . Hypertension    H/O BEEN OFF BP MEDS FOR 1 YEAR  . Hypothyroidism   . Kidney stone   . Knee pain    Left  . Pulmonary emboli ([place]) 2011  . Restless leg syndrome   . Shoulder pain    HOSPITAL COURSE:  Chief complaint; abdominal pain  History of presenting complaint; Courtney Murphy  is a 63 y.o. female with a known history of diabetes mellitus, hypertension, hypothyroidism, asthma, chronic constipation presented to the hospital complaining of abdominal distention and pain.  Here in the emergency room patient was found to have mildly elevated lactic acid.  CT scan of the abdomen pelvis showed fluid-filled bowel.  No obstruction.  Possible ileus.  Patient feels better at this time after passing significant amount of gas.  Pain is improved.  She received 1 dose of IV ciprofloxacin. Patient's last bowel movement was 5 days back.  She was recently on Percocet for ankle pain.  Hospital course; *Constipation with possible ileus. Clinically resolved.  Patient reported having at least 2 bowel movements since yesterday.  Stool is soft.  No abdominal pains this morning.  No nausea or  vomiting.  Had breakfast and tolerated well.  Clinically and hemodynamically stable and wishes to be discharged home.  Prescription for Colace twice daily and as needed Dulcolax given.  Follow-up with primary care physician.  *Diabetes mellitus.    Resume home regimen.  Outpatient monitoring by primary care physician  *Hypertension.  Blood pressure controlled on current regimen. *Asthma.  Inhalers as needed  DISCHARGE CONDITIONS:  Stable CONSULTS OBTAINED:   DRUG ALLERGIES:   Allergies  Allergen Reactions  . Sulfasalazine Hives  . 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 Sulfa Antibiotics Hives  . Penicillins Hives and Rash    Has patient had a PCN reaction causing immediate rash, facial/tongue/throat swelling, SOB or lightheadedness with hypotension: No Has patient had a PCN reaction causing severe rash involving mucus membranes or skin necrosis: No Has patient had a PCN reaction that required hospitalization: No Has patient had a PCN reaction occurring within the last 10 years: No If all of the above answers are "NO", then may proceed with Cephalosporin use.    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      Medication List    STOP taking these medications   aspirin 81 MG EC tablet   conjugated estrogens vaginal cream Commonly known as: Premarin   cyanocobalamin 1000 MCG/ML  injection Commonly known as: (VITAMIN B-12)   diclofenac sodium 1 % Gel Commonly known as: VOLTAREN   HYDROcodone-acetaminophen 10-325 MG tablet Commonly known as: NORCO   methylPREDNISolone 4 MG Tbpk tablet Commonly known as: MEDROL DOSEPAK   metoprolol succinate 25 MG 24 hr tablet Commonly known as: Toprol XL   nitrofurantoin (macrocrystal-monohydrate) 100 MG capsule Commonly known as: MACROBID     TAKE these medications   Amitiza 8 MCG capsule Generic drug: lubiprostone Take 8 mcg by mouth 2 (two) times daily with a meal.   atomoxetine 80 MG capsule Commonly known as: STRATTERA Take 1 capsule (80 mg total) by mouth daily. What changed: when to take this   atorvastatin 20 MG tablet Commonly known as: LIPITOR Take 20 mg by mouth every morning.   docusate sodium 100 MG capsule Commonly known as: Colace Take 1 capsule (100 mg total) by mouth 2 (two) times daily.   Dulcolax 5 MG EC tablet Generic drug: bisacodyl Take 1 tablet (5 mg total) by mouth daily as needed for severe constipation.   escitalopram 10 MG tablet Commonly known as: LEXAPRO Take 20 mg by mouth every morning.   gabapentin 400 MG capsule Commonly known as: NEURONTIN Take 400 mg by mouth 4 (four) times daily as needed (neuropathy).   levothyroxine 100 MCG tablet Commonly known as: SYNTHROID Take 100 mcg by mouth daily before breakfast.   lisinopril 20 MG tablet Commonly known as: ZESTRIL Take 20 mg by mouth daily.   oxyCODONE-acetaminophen 10-325 MG tablet Commonly known as: Percocet Take 1 tablet by mouth every 8 (eight) hours as needed for up to 7 days for pain.   pramipexole 0.5 MG tablet Commonly known as: MIRAPEX Take 0.5-1 mg by mouth at bedtime.   Trulicity [id number] [id number] Sopn Generic drug: Dulaglutide Inject 0.75 mg into the skin every Thursday.        DISCHARGE INSTRUCTIONS:   DIET:  Diabetic diet DISCHARGE CONDITION:  Stable ACTIVITY:  Activity as tolerated OXYGEN:   Home Oxygen: No.  Oxygen Delivery: room air DISCHARGE LOCATION:  home   If you experience worsening of your admission symptoms, develop shortness of breath, life threatening emergency, suicidal or homicidal thoughts you must seek medical attention immediately by calling 911 or calling your MD immediately  if symptoms less severe.  You Must read complete instructions/literature along with all the possible adverse reactions/side effects for all the Medicines you take and that have been prescribed to you. Take any new Medicines after you have completely understood and accpet all the possible adverse reactions/side effects.   Please note  You were cared for by a hospitalist during your hospital stay. If you have any questions about your discharge medications or the care you received while you were in the hospital after you are discharged, you can call the unit and asked to speak with the hospitalist on call if the hospitalist that took care of you is not available. Once you are discharged, your primary care physician will handle any further medical issues.  Please note that NO REFILLS for any discharge medications will be authorized once you are discharged, as it is imperative that you return to your primary care physician (or establish a relationship with a primary care physician if you do not have one) for your aftercare needs so that they can reassess your need for medications and monitor your lab values.    On the day of Discharge:  VITAL SIGNS:  Blood pressure 126/76, pulse 70, temperature 97.9 F (36.6 C), temperature source Oral, resp. rate 16, height 5\' 4"  (1.626 m), weight 77.1 kg, SpO2 97 %. PHYSICAL EXAMINATION:  GENERAL:  63 y.o.-year-old patient lying in the bed with no acute distress.  EYES: Pupils equal, round, reactive to light and accommodation. No scleral icterus. Extraocular muscles intact.  HEENT: Head atraumatic, normocephalic. Oropharynx and nasopharynx clear.  NECK:   Supple, no jugular venous distention. No thyroid enlargement, no tenderness.  LUNGS: Normal breath sounds bilaterally, no wheezing, rales,rhonchi or crepitation. No use of accessory muscles of respiration.  CARDIOVASCULAR: S1, S2 normal. No murmurs, rubs, or gallops.  ABDOMEN: Soft, non-tender, non-distended. Bowel sounds present. No organomegaly or mass.  EXTREMITIES: No pedal edema, cyanosis, or clubbing.  NEUROLOGIC: Cranial nerves II through XII are intact. Muscle strength 5/5 in all extremities. Sensation intact. Gait not checked.  PSYCHIATRIC: The patient is alert and oriented x 3.  SKIN: No obvious rash, lesion, or ulcer.  DATA REVIEW:   CBC Recent Labs  Lab 08/12/19 0724  WBC 10.0  HGB 12.7  HCT 38.5  PLT 233    Chemistries  Recent Labs  Lab 08/11/19 0726 08/12/19 0724  NA 140 144  K 3.5 4.4  CL 105 105  CO2 23 30  GLUCOSE 204* 135*  BUN 13 10  CREATININE 0.75 0.79  CALCIUM 9.6 9.0  AST 22  --   ALT 32  --   ALKPHOS 120  --   BILITOT 0.3  --      Microbiology Results  Results for orders placed or performed during the hospital encounter of 08/11/19  SARS CORONAVIRUS 2 (TAT 6-24 HRS) Nasopharyngeal Nasopharyngeal Swab     Status: Abnormal   Collection Time: 08/11/19 12:20 PM   Specimen: Nasopharyngeal Swab  Result Value Ref Range Status   SARS Coronavirus 2 (A) NEGATIVE Final    SPECIMEN/CONTAINER TYPE INAPPROPRIATE FOR ORDERED TEST, UNABLE TO PERFORM    Comment: Performed at [facility] [address], [place], [place] [address]    RADIOLOGY:  No results found.   Management plans discussed with the patient, family and they are in agreement.  CODE STATUS: Full Code   TOTAL TIME TAKING CARE OF THIS PATIENT: 35 minutes.    Freedom Lopezperez M.D on 08/12/2019 at 11:20 AM  Between 7am to 6pm - Pager - [phone]  After 6pm go to [email or web address] - Proofreader  [facility]  Office  [phone]  CC: Primary care  physician; Audelia Acton, MD   Note: This dictation was prepared with Dragon dictation along with smaller phrase technology. Any transcriptional errors that result from this process are unintentional.

## 2019-08-23 ENCOUNTER — Ambulatory Visit: Payer: BC Managed Care – PPO | Admitting: Podiatry

## 2019-11-15 ENCOUNTER — Ambulatory Visit: Payer: BC Managed Care – PPO | Admitting: Urology

## 2019-11-21 ENCOUNTER — Other Ambulatory Visit: Payer: Self-pay

## 2019-11-21 ENCOUNTER — Emergency Department
Admission: EM | Admit: 2019-11-21 | Discharge: 2019-11-21 | Disposition: A | Discharge status: Home / Self Care | Payer: BC Managed Care – PPO | Attending: Emergency Medicine | Admitting: Emergency Medicine

## 2019-11-21 ENCOUNTER — Emergency Department: Payer: BC Managed Care – PPO

## 2019-11-21 DIAGNOSIS — Z7984 Long term (current) use of oral hypoglycemic drugs: Secondary | ICD-10-CM | POA: Diagnosis not present

## 2019-11-21 DIAGNOSIS — L03115 Cellulitis of right lower limb: Secondary | ICD-10-CM | POA: Insufficient documentation

## 2019-11-21 DIAGNOSIS — E119 Type 2 diabetes mellitus without complications: Secondary | ICD-10-CM | POA: Insufficient documentation

## 2019-11-21 DIAGNOSIS — Z79899 Other long term (current) drug therapy: Secondary | ICD-10-CM | POA: Insufficient documentation

## 2019-11-21 DIAGNOSIS — I10 Essential (primary) hypertension: Secondary | ICD-10-CM | POA: Insufficient documentation

## 2019-11-21 DIAGNOSIS — Z87891 Personal history of nicotine dependence: Secondary | ICD-10-CM | POA: Insufficient documentation

## 2019-11-21 DIAGNOSIS — F909 Attention-deficit hyperactivity disorder, unspecified type: Secondary | ICD-10-CM | POA: Insufficient documentation

## 2019-11-21 DIAGNOSIS — J45909 Unspecified asthma, uncomplicated: Secondary | ICD-10-CM | POA: Diagnosis not present

## 2019-11-21 DIAGNOSIS — E039 Hypothyroidism, unspecified: Secondary | ICD-10-CM | POA: Insufficient documentation

## 2019-11-21 DIAGNOSIS — R2241 Localized swelling, mass and lump, right lower limb: Secondary | ICD-10-CM | POA: Insufficient documentation

## 2019-11-21 DIAGNOSIS — M79671 Pain in right foot: Secondary | ICD-10-CM | POA: Diagnosis present

## 2019-11-21 LAB — CBC WITH DIFFERENTIAL/PLATELET
Abs Immature Granulocytes: 0.06 10*3/uL (ref 0.00–0.07)
Basophils Absolute: 0.1 10*3/uL (ref 0.0–0.1)
Basophils Relative: 0 %
Eosinophils Absolute: 0.1 10*3/uL (ref 0.0–0.5)
Eosinophils Relative: 1 %
HCT: 38.9 % (ref 36.0–46.0)
Hemoglobin: 13.2 g/dL (ref 12.0–15.0)
Immature Granulocytes: 1 %
Lymphocytes Relative: 23 %
Lymphs Abs: 3 10*3/uL (ref 0.7–4.0)
MCH: 31.7 pg (ref 26.0–34.0)
MCHC: 33.9 g/dL (ref 30.0–36.0)
MCV: 93.3 fL (ref 80.0–100.0)
Monocytes Absolute: 0.6 10*3/uL (ref 0.1–1.0)
Monocytes Relative: 5 %
Neutro Abs: 9.4 10*3/uL — ABNORMAL HIGH (ref 1.7–7.7)
Neutrophils Relative %: 70 %
Platelets: 267 10*3/uL (ref 150–400)
RBC: 4.17 MIL/uL (ref 3.87–5.11)
RDW: 12.2 % (ref 11.5–15.5)
WBC: 13.1 10*3/uL — ABNORMAL HIGH (ref 4.0–10.5)
nRBC: 0 % (ref 0.0–0.2)

## 2019-11-21 LAB — BASIC METABOLIC PANEL
Anion gap: 11 (ref 5–15)
BUN: 20 mg/dL (ref 8–23)
CO2: 24 mmol/L (ref 22–32)
Calcium: 8.9 mg/dL (ref 8.9–10.3)
Chloride: 101 mmol/L (ref 98–111)
Creatinine, Ser: 0.8 mg/dL (ref 0.44–1.00)
GFR calc Af Amer: >60 mL/min (ref >60)
GFR calc non Af Amer: >60 mL/min (ref >60)
Glucose, Bld: 140 mg/dL — ABNORMAL HIGH (ref 70–99)
Potassium: 4.4 mmol/L (ref 3.5–5.1)
Sodium: 136 mmol/L (ref 135–145)

## 2019-11-21 LAB — SEDIMENTATION RATE: Sed Rate: 22 mm/hr (ref 0–30)

## 2019-11-21 MED ORDER — TRAMADOL HCL 50 MG PO TABS: 50.0000 mg | ORAL_TABLET | Freq: Four times a day (QID) | ORAL | 0 refills | Status: DC | PRN

## 2019-11-21 MED ORDER — DOXYCYCLINE MONOHYDRATE 100 MG PO CAPS: 100.0000 mg | ORAL_CAPSULE | Freq: Two times a day (BID) | ORAL | 0 refills | Status: DC

## 2019-11-21 NOTE — ED Provider Notes (Signed)
[facility] Emergency Department Provider Note   ____________________________________________   First MD Initiated Contact with Patient 11/21/19 [phone]     (approximate)  I have reviewed the triage vital signs and the nursing notes.   HISTORY  Chief Complaint Leg Swelling    HPI Courtney Murphy is a 63 y.o. female patient complain of left foot pain and swelling radiating to the calf of the leg.  Patient state her dog stepped on her barefoot 3 days ago resulting in a scratch to the dorsal aspect of her foot.  Patient also was concerned secondary to history of stress fractures to the lower extremities.  Patient denies loss sensation or function.  Patient the pain increased with weightbearing and ambulation.  Denies fever associated complaint.     Past Medical History:  Diagnosis Date  . Acid reflux 10/11/2015  . Anemia    RECEIVES IRON INFUSIONS  . Arthritis   . Asthma    WELL CONTROLLED  . Depression   . Diabetes mellitus without complication ([place])   . Fatty liver   . Gout   . History of kidney stones   . History of methicillin resistant staphylococcus aureus (MRSA) 2016  . Hypertension    H/O BEEN OFF BP MEDS FOR 1 YEAR  . Hypothyroidism   . Kidney stone   . Knee pain    Left  . Pulmonary emboli ([place]) 2011  . Restless leg syndrome   . Shoulder pain     Patient Active Problem List   Diagnosis Date Noted  . Constipation 08/11/2019  . Unstable angina ([place]) 01/14/2019  . Kidney stone 08/17/2017  . Type 2 diabetes mellitus without complication, without long-term current use of insulin ([place]) 04/22/2017  . Chest pain 09/30/2016  . Acid reflux 10/11/2015  . Adult hypothyroidism 10/11/2015  . ADHD (attention deficit hyperactivity disorder) 10/11/2015  . Bariatric surgery status 10/22/2014  . HLD (hyperlipidemia) 08/18/2013    Past Surgical History:  Procedure Laterality Date  . ABDOMINAL HYSTERECTOMY    . BACK SURGERY    . BREAST  REDUCTION SURGERY    . CATARACT EXTRACTION W/PHACO Left 07/28/2017   Procedure: CATARACT EXTRACTION PHACO AND INTRAOCULAR LENS PLACEMENT ([place]) diabetic Left;  Surgeon: Leandrew Koyanagi, MD;  Location: [facility];  Service: Ophthalmology;  Laterality: Left;  Diabetic  . CESAREAN SECTION  1990  . CHOLECYSTECTOMY    . CYSTOSCOPY/URETEROSCOPY/HOLMIUM LASER/STENT PLACEMENT Left 09/08/2017   Procedure: CYSTOSCOPY/URETEROSCOPY/HOLMIUM LASER/STENT EXCHANGE;  Surgeon: Hollice Espy, MD;  Location: [facility];  Service: Urology;  Laterality: Left;  . CYSTOSCOPY/URETEROSCOPY/HOLMIUM LASER/STENT PLACEMENT Left 12/14/2018   Procedure: CYSTOSCOPY/URETEROSCOPY/HOLMIUM LASER/STENT PLACEMENT;  Surgeon: Hollice Espy, MD;  Location: [facility];  Service: Urology;  Laterality: Left;  . FOOT SURGERY    . GASTRIC BYPASS    . IR NEPHROSTOMY PLACEMENT LEFT  08/17/2017  . KNEE ARTHROSCOPY Left   . KNEE ARTHROSCOPY Left 07/19/2015   Procedure: ARTHROSCOPY KNEE;  Surgeon: Leanor Kail, MD;  Location: [facility];  Service: Orthopedics;  Laterality: Left;  . NEPHROLITHOTOMY Left 08/17/2017   Procedure: NEPHROLITHOTOMY PERCUTANEOUS WITH HOLMIUM LASER;  Surgeon: Hollice Espy, MD;  Location: [facility];  Service: Urology;  Laterality: Left;  . THUMB ARTHROSCOPY    . TONSILLECTOMY      Prior to Admission medications   Medication Sig Start Date End Date Taking? Authorizing Provider  AMITIZA 8 MCG capsule Take 8 mcg by mouth 2 (two) times daily with a meal.  06/26/19   [provider]  atomoxetine (STRATTERA) 80 MG capsule Take 1 capsule (80 mg total) by mouth daily. Patient taking differently: Take 80 mg by mouth every morning.  10/17/18 08/11/19  Marnee Guarneri T, NP  atorvastatin (LIPITOR) 20 MG tablet Take 20 mg by mouth every morning.  06/27/18   [provider]  bisacodyl (DULCOLAX) 5 MG EC tablet Take 1 tablet (5 mg total) by mouth daily as needed for severe constipation. 08/12/19 08/11/20   Stark Jock Jude, MD  docusate sodium (COLACE) 100 MG capsule Take 1 capsule (100 mg total) by mouth 2 (two) times daily. 08/12/19 08/11/20  Stark Jock Jude, MD  doxycycline (MONODOX) 100 MG capsule Take 1 capsule (100 mg total) by mouth 2 (two) times daily. 11/21/19   Sable Feil, PA-C  Dulaglutide (TRULICITY) A999333 0000000 SOPN Inject 0.75 mg into the skin every Thursday.     [provider]  escitalopram (LEXAPRO) 10 MG tablet Take 20 mg by mouth every morning.  09/13/18   [provider]  gabapentin (NEURONTIN) 400 MG capsule Take 400 mg by mouth 4 (four) times daily as needed (neuropathy).     [provider]  levothyroxine (SYNTHROID, LEVOTHROID) 100 MCG tablet Take 100 mcg by mouth daily before breakfast.    [provider]  lisinopril (ZESTRIL) 20 MG tablet Take 20 mg by mouth daily. 04/25/19   [provider]  pramipexole (MIRAPEX) 0.5 MG tablet Take 0.5-1 mg by mouth at bedtime.     [provider]  traMADol (ULTRAM) 50 MG tablet Take 1 tablet (50 mg total) by mouth every 6 (six) hours as needed. 11/21/19 11/20/20  Sable Feil, PA-C    Allergies Sulfasalazine, Nsaids, Sulfa antibiotics, and Penicillins  Family History  Problem Relation Age of Onset  . Cancer Mother        breast  . Heart disease Father   . Diabetes Father   . Hypertension Father   . Cancer Maternal Grandmother   . Cancer Maternal Grandfather   . Heart disease Paternal Grandmother   . Heart disease Paternal Grandfather   . Cancer Maternal Aunt   . Cancer Maternal Uncle   . Heart disease Paternal Aunt   . Heart disease Paternal Uncle   . Kidney cancer Neg Hx   . Bladder Cancer Neg Hx     Social History Social History   Tobacco Use  . Smoking status: Former Smoker    Packs/day: 0.50    Years: 4.00    Pack years: 2.00    Types: Cigarettes    Quit date: 04/14/2015    Years since quitting: 4.6  . Smokeless tobacco: Never Used  Substance Use Topics  .  Alcohol use: Not Currently    Comment: RARE  . Drug use: No    Review of Systems Constitutional: No fever/chills Eyes: No visual changes. ENT: No sore throat. Cardiovascular: Denies chest pain. Respiratory: Denies shortness of breath. Gastrointestinal: No abdominal pain.  No nausea, no vomiting.  No diarrhea.  No constipation. Genitourinary: Negative for dysuria. Musculoskeletal: Negative for back pain. Skin: Negative for rash.  Redness and swelling right foot Neurological: Negative for headaches, focal weakness or numbness. Endocrine:  Hypertension and gout Allergic/Immunilogical: NSAIDs, sulfa antibiotics, and penicillin. ____________________________________________   PHYSICAL EXAM:  VITAL SIGNS: ED Triage Vitals  Enc Vitals Group     BP 11/21/19 0739 110/76     Pulse Rate 11/21/19 0739 (!) 103     Resp 11/21/19 0739 17     Temp 11/21/19 0739  98.4 F (36.9 C)     Temp Source 11/21/19 0739 Oral     SpO2 11/21/19 0739 100 %     Weight 11/21/19 0740 180 lb (81.6 kg)     Height 11/21/19 0740 5\' 4"  (1.626 m)     Head Circumference --      Peak Flow --      Pain Score 11/21/19 0740 4     Pain Loc --      Pain Edu? --      Excl. in [place]? --    Constitutional: Alert and oriented. Well appearing and in no acute distress. Cardiovascular: Normal rate, regular rhythm. Grossly normal heart sounds.  Good peripheral circulation. Respiratory: Normal respiratory effort.  No retractions. Lungs CTAB. Musculoskeletal: No obvious deformity to the right foot.  Neurologic:  Normal speech and language. No gross focal neurologic deficits are appreciated. No gait instability. Skin:  Skin is warm, dry and intact. No rash noted.  Redness and swelling. Psychiatric: Mood and affect are normal. Speech and behavior are normal.  ____________________________________________   LABS (all labs ordered are listed, but only abnormal results are displayed)  Labs Reviewed  BASIC METABOLIC PANEL -  Abnormal; Notable for the following components:      Result Value   Glucose, Bld 140 (*)    All other components within normal limits  CBC WITH DIFFERENTIAL/PLATELET - Abnormal; Notable for the following components:   WBC 13.1 (*)    Neutro Abs 9.4 (*)    All other components within normal limits  SEDIMENTATION RATE   ____________________________________________  EKG   ____________________________________________  RADIOLOGY  ED MD interpretation:    Official radiology report(s): DG Foot Complete Right  Result Date: 11/21/2019 CLINICAL DATA:  Presents with pain to top of right foot, some redness and swelling noted, ambulates with limp, surgery to right foot years ago EXAM: RIGHT FOOT COMPLETE - 3+ VIEW COMPARISON:  None. FINDINGS: No fracture.  No bone lesion. Changes from a prior first metatarsal osteotomy and bunionectomy. 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 There is articular surface flattening, mild sclerosis and subchondral cysts along the head of the third metatarsal with asymmetric third metatarsophalangeal joint space narrowing. Mild dorsal soft tissue swelling is noted. IMPRESSION: 1. No fracture, dislocation or acute skeletal finding. 2. Arthropathic changes at the third metatarsophalangeal joint. 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 3. Dorsal soft tissue swelling. Electronically Signed   By: Lajean Manes M.D.   On: 11/21/2019 08:27    ____________________________________________   PROCEDURES  Procedure(s) performed (including Critical Care):  Procedures   ____________________________________________   INITIAL IMPRESSION / ASSESSMENT AND PLAN / ED COURSE  As part of my medical decision making, I reviewed the following data within the [facility]     Patient noticed redness swelling to the dorsal aspect of the right foot for 3 days.  Status post scratch by  dog.  Discussed x-ray findings with patient.  Patient physical exam and lab work consistent with cellulitis.  Patient given discharge care instructions and advised to take medication as directed.  Patient also advised to follow-up podiatry due to continue arthritic changes causing pain in her foot.    Courtney Murphy was evaluated in Emergency Department on 11/21/2019 for the symptoms described in the history of present illness. She was evaluated in the context of the global COVID-19 pandemic, which necessitated consideration that the patient might be  at risk for infection with the SARS-CoV-2 virus that causes COVID-19. Institutional protocols and algorithms that pertain to the evaluation of patients at risk for COVID-19 are in a state of rapid change based on information released by regulatory bodies including the CDC and federal and state organizations. These policies and algorithms were followed during the patient's care in the ED.       ____________________________________________   FINAL CLINICAL IMPRESSION(S) / ED DIAGNOSES  Final diagnoses:  Cellulitis of right foot     ED Discharge Orders         Ordered    doxycycline (MONODOX) 100 MG capsule  2 times daily     11/21/19 0940    traMADol (ULTRAM) 50 MG tablet  Every 6 hours PRN     11/21/19 0940           Note:  This document was prepared using Dragon voice recognition software and may include unintentional dictation errors.    Sable Feil, PA-C 11/21/19 [id number]    Lavonia Drafts, MD 11/21/19 [phone]

## 2019-11-21 NOTE — ED Triage Notes (Signed)
Pt states she noticed redness and swelling to the right foot that started on Saturday, states she has a hx of stress fx but no known injury other then her dog stepping on her foot,

## 2019-11-21 NOTE — Discharge Instructions (Addendum)
Follow discharge care instruction take medication as directed.  After you finish medication advised follow-up appointment with your treating podiatrist for the arthritic changes and you have right foot.

## 2019-11-21 NOTE — ED Notes (Signed)
See triage note   Presents with pain to top of left foot  Redness and swelling noted  Ambulates with limp

## 2019-11-25 ENCOUNTER — Encounter: Payer: Self-pay | Admitting: Emergency Medicine

## 2019-11-25 ENCOUNTER — Emergency Department
Admission: EM | Admit: 2019-11-25 | Discharge: 2019-11-25 | Disposition: A | Discharge status: Home / Self Care | Payer: BC Managed Care – PPO | Attending: Student | Admitting: Student

## 2019-11-25 ENCOUNTER — Other Ambulatory Visit: Payer: Self-pay

## 2019-11-25 DIAGNOSIS — Z79899 Other long term (current) drug therapy: Secondary | ICD-10-CM | POA: Insufficient documentation

## 2019-11-25 DIAGNOSIS — Z87891 Personal history of nicotine dependence: Secondary | ICD-10-CM | POA: Diagnosis not present

## 2019-11-25 DIAGNOSIS — E039 Hypothyroidism, unspecified: Secondary | ICD-10-CM | POA: Insufficient documentation

## 2019-11-25 DIAGNOSIS — E119 Type 2 diabetes mellitus without complications: Secondary | ICD-10-CM | POA: Diagnosis not present

## 2019-11-25 DIAGNOSIS — L03115 Cellulitis of right lower limb: Secondary | ICD-10-CM | POA: Diagnosis not present

## 2019-11-25 DIAGNOSIS — M79671 Pain in right foot: Secondary | ICD-10-CM | POA: Diagnosis present

## 2019-11-25 DIAGNOSIS — Z7984 Long term (current) use of oral hypoglycemic drugs: Secondary | ICD-10-CM | POA: Diagnosis not present

## 2019-11-25 DIAGNOSIS — L03119 Cellulitis of unspecified part of limb: Secondary | ICD-10-CM

## 2019-11-25 LAB — CBC WITH DIFFERENTIAL/PLATELET
Abs Immature Granulocytes: 0.05 10*3/uL (ref 0.00–0.07)
Basophils Absolute: 0 10*3/uL (ref 0.0–0.1)
Basophils Relative: 0 %
Eosinophils Absolute: 0.1 10*3/uL (ref 0.0–0.5)
Eosinophils Relative: 1 %
HCT: 37.5 % (ref 36.0–46.0)
Hemoglobin: 13 g/dL (ref 12.0–15.0)
Immature Granulocytes: 0 %
Lymphocytes Relative: 32 %
Lymphs Abs: 3.8 10*3/uL (ref 0.7–4.0)
MCH: 31 pg (ref 26.0–34.0)
MCHC: 34.7 g/dL (ref 30.0–36.0)
MCV: 89.5 fL (ref 80.0–100.0)
Monocytes Absolute: 0.5 10*3/uL (ref 0.1–1.0)
Monocytes Relative: 5 %
Neutro Abs: 7.5 10*3/uL (ref 1.7–7.7)
Neutrophils Relative %: 62 %
Platelets: 290 10*3/uL (ref 150–400)
RBC: 4.19 MIL/uL (ref 3.87–5.11)
RDW: 12.2 % (ref 11.5–15.5)
WBC: 12 10*3/uL — ABNORMAL HIGH (ref 4.0–10.5)
nRBC: 0 % (ref 0.0–0.2)

## 2019-11-25 LAB — BASIC METABOLIC PANEL
Anion gap: 14 (ref 5–15)
BUN: 16 mg/dL (ref 8–23)
CO2: 23 mmol/L (ref 22–32)
Calcium: 9.3 mg/dL (ref 8.9–10.3)
Chloride: 104 mmol/L (ref 98–111)
Creatinine, Ser: 0.72 mg/dL (ref 0.44–1.00)
GFR calc Af Amer: >60 mL/min (ref >60)
GFR calc non Af Amer: >60 mL/min (ref >60)
Glucose, Bld: 109 mg/dL — ABNORMAL HIGH (ref 70–99)
Potassium: 3.9 mmol/L (ref 3.5–5.1)
Sodium: 141 mmol/L (ref 135–145)

## 2019-11-25 MED ORDER — CLINDAMYCIN HCL 300 MG PO CAPS: 300.0000 mg | ORAL_CAPSULE | Freq: Three times a day (TID) | ORAL | 0 refills | Status: AC

## 2019-11-25 MED ORDER — CLINDAMYCIN PHOSPHATE 600 MG/50ML IV SOLN
600.0000 mg | Freq: Once | INTRAVENOUS | Status: AC
  Administered 2019-11-25: 600 mg via INTRAVENOUS
  Filled 2019-11-25: qty 50

## 2019-11-25 NOTE — ED Notes (Signed)
Pt requesting wound check - dx with staph on tues and given doxy. Has f/u appt on 1/7

## 2019-11-25 NOTE — ED Provider Notes (Signed)
[facility] Emergency Department Provider Note  ____________________________________________  Time seen: Approximately 2:08 PM  I have reviewed the triage vital signs and the nursing notes.   HISTORY  Chief Complaint Wound Check   HPI Courtney Murphy is a 63 y.o. female presents to the ER for treatment of right foot pain, increasing swelling, and redness.    She was evaluated here on 11/21/2019 diagnosed with a staph infection in her foot.  She states that the redness and the pain has gotten worse.  She has not noticed any fever.  She is taking doxycycline as prescribed.  Past Medical History:  Diagnosis Date  . Acid reflux 10/11/2015  . Anemia    RECEIVES IRON INFUSIONS  . Arthritis   . Asthma    WELL CONTROLLED  . Depression   . Diabetes mellitus without complication ([place])   . Fatty liver   . Gout   . History of kidney stones   . History of methicillin resistant staphylococcus aureus (MRSA) 2016  . Hypertension    H/O BEEN OFF BP MEDS FOR 1 YEAR  . Hypothyroidism   . Kidney stone   . Knee pain    Left  . Pulmonary emboli ([place]) 2011  . Restless leg syndrome   . Shoulder pain     Patient Active Problem List   Diagnosis Date Noted  . Constipation 08/11/2019  . Unstable angina ([place]) 01/14/2019  . Kidney stone 08/17/2017  . Type 2 diabetes mellitus without complication, without long-term current use of insulin ([place]) 04/22/2017  . Chest pain 09/30/2016  . Acid reflux 10/11/2015  . Adult hypothyroidism 10/11/2015  . ADHD (attention deficit hyperactivity disorder) 10/11/2015  . Bariatric surgery status 10/22/2014  . HLD (hyperlipidemia) 08/18/2013    Past Surgical History:  Procedure Laterality Date  . ABDOMINAL HYSTERECTOMY    . BACK SURGERY    . BREAST REDUCTION SURGERY    . CATARACT EXTRACTION W/PHACO Left 07/28/2017   Procedure: CATARACT EXTRACTION PHACO AND INTRAOCULAR LENS PLACEMENT ([place]) diabetic Left;  Surgeon: Leandrew Koyanagi, MD;  Location: [place];  Service: Ophthalmology;  Laterality: Left;  Diabetic  . CESAREAN SECTION  1990  . CHOLECYSTECTOMY    . CYSTOSCOPY/URETEROSCOPY/HOLMIUM LASER/STENT PLACEMENT Left 09/08/2017   Procedure: CYSTOSCOPY/URETEROSCOPY/HOLMIUM LASER/STENT EXCHANGE;  Surgeon: Hollice Espy, MD;  Location: [facility];  Service: Urology;  Laterality: Left;  . CYSTOSCOPY/URETEROSCOPY/HOLMIUM LASER/STENT PLACEMENT Left 12/14/2018   Procedure: CYSTOSCOPY/URETEROSCOPY/HOLMIUM LASER/STENT PLACEMENT;  Surgeon: Hollice Espy, MD;  Location: [facility];  Service: Urology;  Laterality: Left;  . FOOT SURGERY    . GASTRIC BYPASS    . IR NEPHROSTOMY PLACEMENT LEFT  08/17/2017  . KNEE ARTHROSCOPY Left   . KNEE ARTHROSCOPY Left 07/19/2015   Procedure: ARTHROSCOPY KNEE;  Surgeon: Leanor Kail, MD;  Location: [facility];  Service: Orthopedics;  Laterality: Left;  . NEPHROLITHOTOMY Left 08/17/2017   Procedure: NEPHROLITHOTOMY PERCUTANEOUS WITH HOLMIUM LASER;  Surgeon: Hollice Espy, MD;  Location: [facility];  Service: Urology;  Laterality: Left;  . THUMB ARTHROSCOPY    . TONSILLECTOMY      Prior to Admission medications   Medication Sig Start Date End Date Taking? Authorizing Provider  AMITIZA 8 MCG capsule Take 8 mcg by mouth 2 (two) times daily with a meal.  06/26/19   [provider]  atomoxetine (STRATTERA) 80 MG capsule Take 1 capsule (80 mg total) by mouth daily. Patient taking differently: Take 80 mg by mouth every morning.  10/17/18 08/11/19  Venita Lick,  NP  atorvastatin (LIPITOR) 20 MG tablet Take 20 mg by mouth every morning.  06/27/18   [provider]  bisacodyl (DULCOLAX) 5 MG EC tablet Take 1 tablet (5 mg total) by mouth daily as needed for severe constipation. 08/12/19 08/11/20  Stark Jock Jude, MD  clindamycin (CLEOCIN) 300 MG capsule Take 1 capsule (300 mg total) by mouth 3 (three) times daily for 10 days. 11/25/19 12/05/19  Aysiah Jurado, Johnette Abraham B, FNP  docusate  sodium (COLACE) 100 MG capsule Take 1 capsule (100 mg total) by mouth 2 (two) times daily. 08/12/19 08/11/20  Stark Jock Jude, MD  Dulaglutide (TRULICITY) [id number] [id number] SOPN Inject 0.75 mg into the skin every Thursday.     [provider]  escitalopram (LEXAPRO) 10 MG tablet Take 20 mg by mouth every morning.  09/13/18   [provider]  gabapentin (NEURONTIN) 400 MG capsule Take 400 mg by mouth 4 (four) times daily as needed (neuropathy).     [provider]  levothyroxine (SYNTHROID, LEVOTHROID) 100 MCG tablet Take 100 mcg by mouth daily before breakfast.    [provider]  lisinopril (ZESTRIL) 20 MG tablet Take 20 mg by mouth daily. 04/25/19   [provider]  pramipexole (MIRAPEX) 0.5 MG tablet Take 0.5-1 mg by mouth at bedtime.     [provider]  traMADol (ULTRAM) 50 MG tablet Take 1 tablet (50 mg total) by mouth every 6 (six) hours as needed. 11/21/19 11/20/20  Sable Feil, PA-C    Allergies Sulfasalazine, Nsaids, Sulfa antibiotics, and Penicillins  Family History  Problem Relation Age of Onset  . Cancer Mother        breast  . Heart disease Father   . Diabetes Father   . Hypertension Father   . Cancer Maternal Grandmother   . Cancer Maternal Grandfather   . Heart disease Paternal Grandmother   . Heart disease Paternal Grandfather   . Cancer Maternal Aunt   . Cancer Maternal Uncle   . Heart disease Paternal Aunt   . Heart disease Paternal Uncle   . Kidney cancer Neg Hx   . Bladder Cancer Neg Hx     Social History Social History   Tobacco Use  . Smoking status: Former Smoker    Packs/day: 0.50    Years: 4.00    Pack years: 2.00    Types: Cigarettes    Quit date: 04/14/2015    Years since quitting: 4.6  . Smokeless tobacco: Never Used  Substance Use Topics  . Alcohol use: Not Currently    Comment: RARE  . Drug use: No    Review of Systems  Constitutional: Negative for fever. Respiratory: Negative for cough or  shortness of breath.  Musculoskeletal: Negative for myalgias Skin: Positive for right foot redness and swelling Neurological: Negative for numbness or paresthesias. ____________________________________________   PHYSICAL EXAM:  VITAL SIGNS: ED Triage Vitals  Enc Vitals Group     BP 11/25/19 1257 135/78     Pulse Rate 11/25/19 1257 95     Resp 11/25/19 1257 18     Temp 11/25/19 1257 98.5 F (36.9 C)     Temp src --      SpO2 11/25/19 1257 98 %     Weight 11/25/19 1257 180 lb (81.6 kg)     Height 11/25/19 1257 5\' 4"  (1.626 m)     Head Circumference --      Peak Flow --      Pain Score 11/25/19 1301 3  Pain Loc --      Pain Edu? --      Excl. in [place]? --      Constitutional: Overall well appearing. Eyes: Conjunctivae are clear without discharge or drainage. Nose: No rhinorrhea noted. Mouth/Throat: Airway is patent.  Neck: No stridor. Unrestricted range of motion observed. Cardiovascular: Capillary refill is <3 seconds.  Respiratory: Respirations are even and unlabored.. Musculoskeletal: Unrestricted range of motion observed. Neurologic: Awake, alert, and oriented x 4.  Skin: Diffuse edema with erythema over the dorsal aspect of the right foot that extends to the toes.  No open wounds or lesions are noted.  ____________________________________________   LABS (all labs ordered are listed, but only abnormal results are displayed)  Labs Reviewed  BASIC METABOLIC PANEL - Abnormal; Notable for the following components:      Result Value   Glucose, Bld 109 (*)    All other components within normal limits  CBC WITH DIFFERENTIAL/PLATELET - Abnormal; Notable for the following components:   WBC 12.0 (*)    All other components within normal limits  CULTURE, BLOOD (ROUTINE X 2)  CULTURE, BLOOD (ROUTINE X 2)   ____________________________________________  EKG  Not indicated. ____________________________________________  RADIOLOGY  Not  indicated ____________________________________________   PROCEDURES  Procedures ____________________________________________   INITIAL IMPRESSION / ASSESSMENT AND PLAN / ED COURSE  BILL BONVILLE is a 63 y.o. female presenting to the emergency department for treatment and evaluation of worsening cellulitis.  See HPI for further details.  While here, labs were drawn.  White blood cell count is reducing.   It was 13.14 days ago and today is down to 12.0.  No left shift.  Blood cultures were sent as well.  Patient will be treated with clindamycin since she has allergies to sulfa and penicillin.  She was advised to stop the doxycycline.  She was advised to return to the emergency department immediately for any worsening symptoms if she is unable to see primary care.  Medications  clindamycin (CLEOCIN) IVPB 600 mg (0 mg Intravenous Stopped 11/25/19 1705)     Pertinent labs & imaging results that were available during my care of the patient were reviewed by me and considered in my medical decision making (see chart for details).  ____________________________________________   FINAL CLINICAL IMPRESSION(S) / ED DIAGNOSES  Final diagnoses:  Cellulitis of foot    ED Discharge Orders         Ordered    clindamycin (CLEOCIN) 300 MG capsule  3 times daily     11/25/19 1702           Note:  This document was prepared using Dragon voice recognition software and may include unintentional dictation errors.   Victorino Dike, FNP 11/25/19 1734    Nance Pear, MD 11/26/19 1515

## 2019-11-25 NOTE — ED Notes (Addendum)
Iv team able to place IV but unable to draw blood from IV. Lab called to do blood draw. Antibiotics to be started after blood cultures drawn.

## 2019-11-25 NOTE — ED Triage Notes (Addendum)
Pt arrived via POV with reports of wound check from when she was seen here on Tuesday, Pt currently taking doxycycline, pt states wound is not getting any better.  Pt concerned about swelling to foot.  Pt wearing post-op shoe on arrival.

## 2019-11-25 NOTE — ED Notes (Signed)
This RN and Charlean Sanfilippo attempted blood draw and IV starts x3 with no success, IV team consult placed

## 2019-11-30 LAB — CULTURE, BLOOD (ROUTINE X 2)
Culture: NO GROWTH
Culture: NO GROWTH
Special Requests: ADEQUATE

## 2019-12-07 ENCOUNTER — Ambulatory Visit: Payer: BC Managed Care – PPO | Admitting: Podiatry

## 2019-12-07 ENCOUNTER — Other Ambulatory Visit: Payer: Self-pay

## 2019-12-07 DIAGNOSIS — M778 Other enthesopathies, not elsewhere classified: Secondary | ICD-10-CM | POA: Diagnosis not present

## 2019-12-07 NOTE — Progress Notes (Signed)
She presents today for follow-up of the right foot went to the ER and was diagnosed with a staph infection was given antibiotics and notes that the infection slowly went away.  She is referring to the second digit of the right foot had a x-ray performed at the ED demonstrating that there were no abnormalities other than osteoarthritis of the third metatarsophalangeal joint.  Objective: Vital signs are stable alert oriented x3.  Pulses are palpable.  Hammertoe deformity second right painful on palpation at the PIPJ mildly warm to the touch.  Mild erythema at that spot only.  She has no pain on palpation of the metatarsophalangeal joint with exception of the third metatarsophalangeal joint there is a mild edema there present.  I reviewed the radiographs and the chart today recently taken at the hospital which do demonstrate osteoarthritis of the third metatarsophalangeal joint or gouty arthritis possibly.  Also she does have osteoarthritic changes of the PIPJ of the DIPJ of the second digit of the right foot with edema to the toe.  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  Plan: This point I injected the area with dexamethasone local anesthetic.  Special note.  Should Ms. Streeter call our office stating that she probably has gout we are to provide her with a requisition for blood work and she is to go to [facility] and have an arthritic profile drawn.  She will also have a CBC drawn at the same time.

## 2019-12-08 ENCOUNTER — Telehealth: Payer: Self-pay | Admitting: Urology

## 2019-12-08 DIAGNOSIS — N200 Calculus of kidney: Secondary | ICD-10-CM

## 2019-12-08 NOTE — Telephone Encounter (Signed)
Courtney Murphy  wanted this patient to have a Rus prior to her app but there is not an order  Ca you please put an order in   [employer]

## 2019-12-08 NOTE — Addendum Note (Signed)
Addended by: Verlene Mayer A on: 12/08/2019 01:05 PM   Modules accepted: Orders

## 2019-12-08 NOTE — Telephone Encounter (Signed)
Order placed

## 2019-12-11 ENCOUNTER — Other Ambulatory Visit: Payer: Self-pay | Admitting: Urology

## 2019-12-11 DIAGNOSIS — N200 Calculus of kidney: Secondary | ICD-10-CM

## 2019-12-18 ENCOUNTER — Ambulatory Visit
Admission: RE | Admit: 2019-12-18 | Discharge: 2019-12-18 | Disposition: A | Discharge status: Home / Self Care | Payer: BC Managed Care – PPO | Source: Ambulatory Visit | Attending: Urology | Admitting: Urology

## 2019-12-18 ENCOUNTER — Other Ambulatory Visit: Payer: BC Managed Care – PPO

## 2019-12-18 ENCOUNTER — Other Ambulatory Visit: Payer: Self-pay

## 2019-12-18 DIAGNOSIS — N200 Calculus of kidney: Secondary | ICD-10-CM | POA: Diagnosis not present

## 2019-12-20 ENCOUNTER — Telehealth: Payer: BC Managed Care – PPO | Admitting: Urology

## 2019-12-26 ENCOUNTER — Ambulatory Visit: Payer: BC Managed Care – PPO | Admitting: Podiatry

## 2020-01-01 ENCOUNTER — Other Ambulatory Visit: Payer: Self-pay | Admitting: Podiatry

## 2020-01-01 DIAGNOSIS — M48061 Spinal stenosis, lumbar region without neurogenic claudication: Secondary | ICD-10-CM | POA: Insufficient documentation

## 2020-01-01 DIAGNOSIS — M109 Gout, unspecified: Secondary | ICD-10-CM

## 2020-01-01 NOTE — Progress Notes (Signed)
Patient came into office today requesting bloodwork. Printed out requisition and gave to the patient.

## 2020-01-02 LAB — CBC WITH DIFFERENTIAL/PLATELET
Basophils Absolute: 0.1 10*3/uL (ref 0.0–0.2)
Basos: 1 %
EOS (ABSOLUTE): 0.2 10*3/uL (ref 0.0–0.4)
Eos: 3 %
Hematocrit: 39.5 % (ref 34.0–46.6)
Hemoglobin: 13.8 g/dL (ref 11.1–15.9)
Immature Grans (Abs): 0 10*3/uL (ref 0.0–0.1)
Immature Granulocytes: 0 %
Lymphocytes Absolute: 2.6 10*3/uL (ref 0.7–3.1)
Lymphs: 34 %
MCH: 31.5 pg (ref 26.6–33.0)
MCHC: 34.9 g/dL (ref 31.5–35.7)
MCV: 90 fL (ref 79–97)
Monocytes Absolute: 0.4 10*3/uL (ref 0.1–0.9)
Monocytes: 6 %
Neutrophils Absolute: 4.3 10*3/uL (ref 1.4–7.0)
Neutrophils: 56 %
Platelets: 284 10*3/uL (ref 150–450)
RBC: 4.38 x10E6/uL (ref 3.77–5.28)
RDW: 11.6 % — ABNORMAL LOW (ref 11.7–15.4)
WBC: 7.7 10*3/uL (ref 3.4–10.8)

## 2020-01-02 LAB — ANA: Anti Nuclear Antibody (ANA): NEGATIVE

## 2020-01-02 LAB — RHEUMATOID FACTOR: Rheumatoid fact SerPl-aCnc: <10 IU/mL (ref 0.0–13.9)

## 2020-01-02 LAB — C-REACTIVE PROTEIN: CRP: 1 mg/L (ref 0–10)

## 2020-01-02 LAB — SEDIMENTATION RATE: Sed Rate: 9 mm/hr (ref 0–40)

## 2020-01-02 LAB — URIC ACID: Uric Acid: 5.4 mg/dL (ref 3.0–7.2)

## 2020-01-09 ENCOUNTER — Other Ambulatory Visit: Payer: Self-pay

## 2020-01-09 ENCOUNTER — Ambulatory Visit
Admission: RE | Admit: 2020-01-09 | Discharge: 2020-01-09 | Disposition: A | Discharge status: Home / Self Care | Payer: BC Managed Care – PPO | Source: Ambulatory Visit | Attending: Urology | Admitting: Urology

## 2020-01-09 ENCOUNTER — Ambulatory Visit
Admission: RE | Admit: 2020-01-09 | Discharge: 2020-01-09 | Disposition: A | Discharge status: Home / Self Care | Payer: BC Managed Care – PPO | Attending: Urology | Admitting: Urology

## 2020-01-09 DIAGNOSIS — N200 Calculus of kidney: Secondary | ICD-10-CM | POA: Insufficient documentation

## 2020-01-11 ENCOUNTER — Other Ambulatory Visit: Payer: Self-pay

## 2020-01-11 ENCOUNTER — Telehealth (INDEPENDENT_AMBULATORY_CARE_PROVIDER_SITE_OTHER): Payer: BC Managed Care – PPO | Admitting: Urology

## 2020-01-11 DIAGNOSIS — N200 Calculus of kidney: Secondary | ICD-10-CM | POA: Diagnosis not present

## 2020-01-11 NOTE — Progress Notes (Signed)
Virtual Visit via Video Note  I connected with Courtney Murphy on 01/11/20 at 11:15 AM EST by a video enabled telemedicine application and verified that I am speaking with the correct person using two identifiers.  Location: Patient: home Provider: office   I discussed the limitations of evaluation and management by telemedicine and the availability of in person appointments. The patient expressed understanding and agreed to proceed.  History of Present Illness: 64 year old female known to our practice for history of recurrent urinary tract infections returns today for routine annual follow-up with KUB and renal ultrasound.  She reports that over the past year, she had multiple medical issues.  She was admitted for gastroenteritis, had issues with her feet and most recently underwent spinal surgery with an L4-5 fusion.  She is recovering from this now.  Over the past year, she not had any stone episodes or flank pain.  She tried to drink more water.  KUB today shows a 5 mm left stone.  Renal ultrasound shows no evidence of hydronephrosis and some bilateral small calcifications.  This improved from previous studies.  She did have a metabolic work-up in [id number] which showed hypercalciuria and low volume.  She is been managed with dietary modification.  She does have an extensive history of kidney stones and requiring PCNL multiple ureteroscopy, most recently in 11/2018.  KUB and renal ultrasound were personally reviewed today.  Agree with radiologic interpretation.   Observations/Objective: Interactive, pleasant, appears well  Assessment and Plan:  1. Left nephrolithiasis Small minor left kidney stone  Asymptomatic, will manage conservatively  KUB in 1 year for comparison or sooner as needed  2. Recurrent nephrolithiasis Reviewed stone diet recommendations again at length today    Follow Up Instructions: F/u 1 year with KUB or sooner   I discussed the assessment and treatment  plan with the patient. The patient was provided an opportunity to ask questions and all were answered. The patient agreed with the plan and demonstrated an understanding of the instructions.   The patient was advised to call back or seek an in-person evaluation if the symptoms worsen or if the condition fails to improve as anticipated.  I provided 8 minutes of non-face-to-face time during this encounter.   Hollice Espy, MD

## 2020-02-21 ENCOUNTER — Other Ambulatory Visit: Payer: Self-pay | Admitting: Podiatry

## 2020-02-21 ENCOUNTER — Other Ambulatory Visit: Payer: Self-pay

## 2020-02-21 ENCOUNTER — Ambulatory Visit: Payer: BC Managed Care – PPO | Admitting: Podiatry

## 2020-02-21 ENCOUNTER — Ambulatory Visit (INDEPENDENT_AMBULATORY_CARE_PROVIDER_SITE_OTHER): Payer: BC Managed Care – PPO

## 2020-02-21 ENCOUNTER — Encounter: Payer: Self-pay | Admitting: Podiatry

## 2020-02-21 VITALS — Temp 97.9°F

## 2020-02-21 DIAGNOSIS — M778 Other enthesopathies, not elsewhere classified: Secondary | ICD-10-CM

## 2020-02-21 DIAGNOSIS — S90121A Contusion of right lesser toe(s) without damage to nail, initial encounter: Secondary | ICD-10-CM | POA: Diagnosis not present

## 2020-02-21 DIAGNOSIS — S92504A Nondisplaced unspecified fracture of right lesser toe(s), initial encounter for closed fracture: Secondary | ICD-10-CM | POA: Diagnosis not present

## 2020-02-21 DIAGNOSIS — S90122A Contusion of left lesser toe(s) without damage to nail, initial encounter: Secondary | ICD-10-CM

## 2020-02-21 NOTE — Progress Notes (Signed)
She presents today states that I kicked my bed with my fifth toe again about 3 weeks ago now have pain across the top of my foot little toe still is bruised and sore.  Objective: Vital signs are stable she is alert and oriented x3 swollen fifth digit of the right foot no ecchymosis is noted.  She has mild tenderness on palpation of the base of the proximal phalanx fifth digit right foot.  Radiographs confirm an impacted fracture which appears to be through the joint.  Metatarsals are not fractured.  She has painful capsulitis of the lesser metatarsophalangeal joints with mild swelling.  Assessment: Capsulitis dorsal aspect right foot level of the metatarsophalangeal joints.  Fracture fifth digit right foot none comminuted.  Plan: Injected transversely across the top of the metatarsophalangeal joint area to help alleviate the symptoms there.  There is should help alleviate the symptoms and she tolerated procedure well.  Follow-up with her in the near future for reevaluation.

## 2020-03-20 ENCOUNTER — Emergency Department
Admission: EM | Admit: 2020-03-20 | Discharge: 2020-03-20 | Disposition: A | Discharge status: Home / Self Care | Payer: BC Managed Care – PPO | Attending: Emergency Medicine | Admitting: Emergency Medicine

## 2020-03-20 ENCOUNTER — Encounter: Payer: Self-pay | Admitting: Emergency Medicine

## 2020-03-20 ENCOUNTER — Other Ambulatory Visit: Payer: Self-pay

## 2020-03-20 DIAGNOSIS — Z5321 Procedure and treatment not carried out due to patient leaving prior to being seen by health care provider: Secondary | ICD-10-CM | POA: Insufficient documentation

## 2020-03-20 DIAGNOSIS — M79671 Pain in right foot: Secondary | ICD-10-CM | POA: Diagnosis not present

## 2020-03-20 DIAGNOSIS — R2241 Localized swelling, mass and lump, right lower limb: Secondary | ICD-10-CM | POA: Insufficient documentation

## 2020-03-20 NOTE — ED Notes (Signed)
Pt called in the WR with no response 

## 2020-03-20 NOTE — ED Triage Notes (Signed)
Right foot pain and swelling x 2 weeks.  Denies injury, however has injured right 5th toe several times a few months ago.

## 2020-03-20 NOTE — ED Notes (Signed)
Pt called with no response

## 2020-03-25 ENCOUNTER — Ambulatory Visit (INDEPENDENT_AMBULATORY_CARE_PROVIDER_SITE_OTHER): Payer: BC Managed Care – PPO

## 2020-03-25 ENCOUNTER — Other Ambulatory Visit: Payer: Self-pay

## 2020-03-25 ENCOUNTER — Ambulatory Visit: Payer: BC Managed Care – PPO | Admitting: Podiatry

## 2020-03-25 ENCOUNTER — Other Ambulatory Visit: Payer: Self-pay | Admitting: Podiatry

## 2020-03-25 ENCOUNTER — Encounter: Payer: Self-pay | Admitting: Podiatry

## 2020-03-25 VITALS — Temp 98.1°F

## 2020-03-25 DIAGNOSIS — S9001XA Contusion of right ankle, initial encounter: Secondary | ICD-10-CM

## 2020-03-25 DIAGNOSIS — S92024A Nondisplaced fracture of anterior process of right calcaneus, initial encounter for closed fracture: Secondary | ICD-10-CM | POA: Diagnosis not present

## 2020-03-25 DIAGNOSIS — S92504A Nondisplaced unspecified fracture of right lesser toe(s), initial encounter for closed fracture: Secondary | ICD-10-CM

## 2020-03-25 DIAGNOSIS — M778 Other enthesopathies, not elsewhere classified: Secondary | ICD-10-CM

## 2020-03-25 NOTE — Progress Notes (Signed)
She presents today states that her toe fifth right feels much better the top of the foot still hurts particular right here she points to the second third metatarsophalangeal joint area.  She states my ankles been killing me for past 2 to 3 days is red hot and swollen went to the walk-in orthopedic office they did an x-ray of the foot said nothing was broken and put her in a boot.  Objective: Vital signs are stable she is alert and oriented x3.  Pulses are palpable.  She has pain on the floor the sinus tarsi of the right foot.  Right fifth toe is doing much better she still has erythema and edema overlying the second metatarsophalangeal joint area.  Radiographs taken today demonstrate possible none dislocated none comminuted fracture of the anterior process of the calcaneus subtalar joint.  Assessment: Fracture anterior process.  Also capsulitis with predislocation syndrome second metatarsophalangeal joint right and a well-healed fifth fracture.  Plan: Injected around the second metatarsophalangeal joint today 10 mg Kenalog 5 mg Marcaine.  Alleviated her symptoms immediately.  Also recommended surgery.  She will consider this in the near future.  Also recommend that she continue the use of the cam walker from Ortho.

## 2020-04-02 ENCOUNTER — Telehealth: Payer: Self-pay | Admitting: *Deleted

## 2020-04-02 NOTE — Telephone Encounter (Signed)
Pt states she and Dr. Milinda Pointer have discussed surgery for her right foot and she would like to get that scheduled.

## 2020-04-23 ENCOUNTER — Emergency Department
Admission: EM | Admit: 2020-04-23 | Discharge: 2020-04-23 | Discharge status: Absent without leave | Payer: BC Managed Care – PPO

## 2020-05-06 ENCOUNTER — Ambulatory Visit (INDEPENDENT_AMBULATORY_CARE_PROVIDER_SITE_OTHER): Payer: BC Managed Care – PPO

## 2020-05-06 ENCOUNTER — Other Ambulatory Visit: Payer: Self-pay

## 2020-05-06 ENCOUNTER — Encounter: Payer: Self-pay | Admitting: Podiatry

## 2020-05-06 ENCOUNTER — Ambulatory Visit: Payer: BC Managed Care – PPO | Admitting: Podiatry

## 2020-05-06 DIAGNOSIS — S92024D Nondisplaced fracture of anterior process of right calcaneus, subsequent encounter for fracture with routine healing: Secondary | ICD-10-CM

## 2020-05-06 DIAGNOSIS — M778 Other enthesopathies, not elsewhere classified: Secondary | ICD-10-CM | POA: Diagnosis not present

## 2020-05-06 NOTE — Progress Notes (Signed)
She presents today for follow-up of fracture calcaneus anterior process states was doing okay until this past Friday and started up again.  Objective: Vital signs are stable she is alert and oriented x3.  Pulses are palpable.  No erythema edema cellulitis drainage odor she does have pain on palpation of the anterior process.  Radiographs demonstrate anterior process appears to be healing in good position.  No new abnormalities are visualized.  Assessment: Capsulitis of the subtalar joint slowly healing anterior process per x-ray.  Plan: I injected the sinus tarsi today with just 2 mg of dexamethasone to help alleviate her current symptoms in the area we will consider surgical intervention to the second metatarsophalangeal joint second toe in the future.

## 2020-05-26 ENCOUNTER — Emergency Department
Admission: EM | Admit: 2020-05-26 | Discharge: 2020-05-26 | Disposition: A | Discharge status: Home / Self Care | Payer: BC Managed Care – PPO | Attending: Emergency Medicine | Admitting: Emergency Medicine

## 2020-05-26 ENCOUNTER — Other Ambulatory Visit: Payer: Self-pay

## 2020-05-26 DIAGNOSIS — Z5321 Procedure and treatment not carried out due to patient leaving prior to being seen by health care provider: Secondary | ICD-10-CM | POA: Insufficient documentation

## 2020-05-26 DIAGNOSIS — R55 Syncope and collapse: Secondary | ICD-10-CM | POA: Insufficient documentation

## 2020-05-26 LAB — CBC
HCT: 33 % — ABNORMAL LOW (ref 36.0–46.0)
Hemoglobin: 11.1 g/dL — ABNORMAL LOW (ref 12.0–15.0)
MCH: 31.1 pg (ref 26.0–34.0)
MCHC: 33.6 g/dL (ref 30.0–36.0)
MCV: 92.4 fL (ref 80.0–100.0)
Platelets: 275 10*3/uL (ref 150–400)
RBC: 3.57 MIL/uL — ABNORMAL LOW (ref 3.87–5.11)
RDW: 13.3 % (ref 11.5–15.5)
WBC: 10.1 10*3/uL (ref 4.0–10.5)
nRBC: 0 % (ref 0.0–0.2)

## 2020-05-26 LAB — GLUCOSE, CAPILLARY: Glucose-Capillary: 245 mg/dL — ABNORMAL HIGH (ref 70–99)

## 2020-05-26 LAB — TROPONIN I (HIGH SENSITIVITY): Troponin I (High Sensitivity): 6 ng/L (ref <18)

## 2020-05-26 LAB — BASIC METABOLIC PANEL
Anion gap: 13 (ref 5–15)
BUN: 17 mg/dL (ref 8–23)
CO2: 24 mmol/L (ref 22–32)
Calcium: 8.8 mg/dL — ABNORMAL LOW (ref 8.9–10.3)
Chloride: 101 mmol/L (ref 98–111)
Creatinine, Ser: 0.94 mg/dL (ref 0.44–1.00)
GFR calc Af Amer: >60 mL/min (ref >60)
GFR calc non Af Amer: >60 mL/min (ref >60)
Glucose, Bld: 265 mg/dL — ABNORMAL HIGH (ref 70–99)
Potassium: 4 mmol/L (ref 3.5–5.1)
Sodium: 138 mmol/L (ref 135–145)

## 2020-05-26 MED ORDER — SODIUM CHLORIDE 0.9% FLUSH: 3.0000 mL | Freq: Once | INTRAVENOUS | Status: DC

## 2020-05-26 NOTE — ED Notes (Signed)
Pt called in the WR with no response 

## 2020-05-26 NOTE — ED Triage Notes (Signed)
Pt to the er for pain to the left calf on the lateral side. Pt has swelling to the right foot, dizziness when she stands and several syncopal episodes. Last episode was Wednesday. This happens with positional changes. Pt takes lisinopril. Hx of blood clots.

## 2020-05-26 NOTE — ED Notes (Signed)
Pt called in the WR with no response, pt is not visualized at this time 

## 2020-05-27 ENCOUNTER — Telehealth: Payer: Self-pay | Admitting: Emergency Medicine

## 2020-05-27 NOTE — Telephone Encounter (Signed)
Called patient due to lwot to inquire about condition and follow up plans. She says she does plan to call pcp and see what they can do for her there.

## 2020-06-14 ENCOUNTER — Ambulatory Visit: Payer: BC Managed Care – PPO | Attending: Internal Medicine

## 2020-06-14 DIAGNOSIS — Z20822 Contact with and (suspected) exposure to covid-19: Secondary | ICD-10-CM

## 2020-06-15 LAB — NOVEL CORONAVIRUS, NAA: SARS-CoV-2, NAA: NOT DETECTED

## 2020-06-15 LAB — SARS-COV-2, NAA 2 DAY TAT

## 2020-06-17 ENCOUNTER — Ambulatory Visit: Payer: BC Managed Care – PPO | Admitting: Podiatry

## 2020-06-18 ENCOUNTER — Encounter: Payer: Self-pay | Admitting: Podiatry

## 2020-07-10 DIAGNOSIS — S63502A Unspecified sprain of left wrist, initial encounter: Secondary | ICD-10-CM | POA: Insufficient documentation

## 2020-07-17 ENCOUNTER — Ambulatory Visit: Payer: BC Managed Care – PPO | Admitting: Podiatry

## 2020-07-17 ENCOUNTER — Other Ambulatory Visit: Payer: Self-pay

## 2020-07-17 ENCOUNTER — Ambulatory Visit (INDEPENDENT_AMBULATORY_CARE_PROVIDER_SITE_OTHER): Payer: BC Managed Care – PPO | Admitting: Podiatry

## 2020-07-17 ENCOUNTER — Other Ambulatory Visit: Payer: Self-pay | Admitting: Podiatry

## 2020-07-17 ENCOUNTER — Encounter: Payer: Self-pay | Admitting: Podiatry

## 2020-07-17 ENCOUNTER — Ambulatory Visit (INDEPENDENT_AMBULATORY_CARE_PROVIDER_SITE_OTHER): Payer: BC Managed Care – PPO

## 2020-07-17 DIAGNOSIS — F32A Depression, unspecified: Secondary | ICD-10-CM | POA: Insufficient documentation

## 2020-07-17 DIAGNOSIS — M19049 Primary osteoarthritis, unspecified hand: Secondary | ICD-10-CM | POA: Insufficient documentation

## 2020-07-17 DIAGNOSIS — S86012A Strain of left Achilles tendon, initial encounter: Secondary | ICD-10-CM

## 2020-07-17 DIAGNOSIS — D649 Anemia, unspecified: Secondary | ICD-10-CM | POA: Insufficient documentation

## 2020-07-17 DIAGNOSIS — M25559 Pain in unspecified hip: Secondary | ICD-10-CM | POA: Insufficient documentation

## 2020-07-17 DIAGNOSIS — M778 Other enthesopathies, not elsewhere classified: Secondary | ICD-10-CM

## 2020-07-17 DIAGNOSIS — E669 Obesity, unspecified: Secondary | ICD-10-CM | POA: Insufficient documentation

## 2020-07-17 DIAGNOSIS — M7662 Achilles tendinitis, left leg: Secondary | ICD-10-CM

## 2020-07-17 DIAGNOSIS — I82402 Acute embolism and thrombosis of unspecified deep veins of left lower extremity: Secondary | ICD-10-CM

## 2020-07-17 DIAGNOSIS — E785 Hyperlipidemia, unspecified: Secondary | ICD-10-CM | POA: Insufficient documentation

## 2020-07-17 DIAGNOSIS — Z9889 Other specified postprocedural states: Secondary | ICD-10-CM | POA: Insufficient documentation

## 2020-07-17 DIAGNOSIS — M2550 Pain in unspecified joint: Secondary | ICD-10-CM | POA: Insufficient documentation

## 2020-07-17 DIAGNOSIS — I10 Essential (primary) hypertension: Secondary | ICD-10-CM | POA: Insufficient documentation

## 2020-07-17 DIAGNOSIS — J45909 Unspecified asthma, uncomplicated: Secondary | ICD-10-CM | POA: Insufficient documentation

## 2020-07-17 DIAGNOSIS — M722 Plantar fascial fibromatosis: Secondary | ICD-10-CM

## 2020-07-17 DIAGNOSIS — F329 Major depressive disorder, single episode, unspecified: Secondary | ICD-10-CM | POA: Insufficient documentation

## 2020-07-17 DIAGNOSIS — M5136 Other intervertebral disc degeneration, lumbar region: Secondary | ICD-10-CM | POA: Insufficient documentation

## 2020-07-17 MED ORDER — METHYLPREDNISOLONE 4 MG PO TBPK
ORAL_TABLET | ORAL | 0 refills | Status: DC
Start: 2020-07-17 — End: 2020-10-11

## 2020-07-18 LAB — COMPREHENSIVE METABOLIC PANEL
ALT: 20 IU/L (ref 0–32)
AST: 16 IU/L (ref 0–40)
Albumin/Globulin Ratio: 2 (ref 1.2–2.2)
Albumin: 4.4 g/dL (ref 3.8–4.8)
Alkaline Phosphatase: 168 IU/L — ABNORMAL HIGH (ref 48–121)
BUN/Creatinine Ratio: 12 (ref 12–28)
BUN: 10 mg/dL (ref 8–27)
Bilirubin Total: 0.3 mg/dL (ref 0.0–1.2)
CO2: 26 mmol/L (ref 20–29)
Calcium: 9.5 mg/dL (ref 8.7–10.3)
Chloride: 103 mmol/L (ref 96–106)
Creatinine, Ser: 0.84 mg/dL (ref 0.57–1.00)
GFR calc Af Amer: 85 mL/min/{1.73_m2} (ref >59)
GFR calc non Af Amer: 74 mL/min/{1.73_m2} (ref >59)
Globulin, Total: 2.2 g/dL (ref 1.5–4.5)
Glucose: 145 mg/dL — ABNORMAL HIGH (ref 65–99)
Potassium: 4.9 mmol/L (ref 3.5–5.2)
Sodium: 143 mmol/L (ref 134–144)
Total Protein: 6.6 g/dL (ref 6.0–8.5)

## 2020-07-18 LAB — D-DIMER, QUANTITATIVE: D-DIMER: 3.32 mg/L FEU — ABNORMAL HIGH (ref 0.00–0.49)

## 2020-07-18 NOTE — Progress Notes (Signed)
She presents today chief complaint of pain to her right foot and would like to discuss surgery. However she is complaining of severe pain in the posterior aspect of her left heel. She states that is been sore and swelling now for the past few weeks.  Objective: Vital signs are stable alert oriented x3. There is no erythema edema cellulitis drainage or odor to the right foot however there is considerable dislocation at all of the toes and they are all tight and rigid and arthritic. However the left heel does demonstrate swelling and tenderness along the Achilles particular its insertion site it is warm to the touch and her calf is tender as well.  Assessment: Moderate to severe arthritic changes in the right foot however there is no acute findings in the left concerning for clots in the calf as well as a tear of the Achilles.  Plan: Discussed etiology pathology and surgical therapies at this point time we are asking for CBC and a D-dimer to be drawn I will also send her for an MRI of her left ankle this will be for [facility] surgical consideration.

## 2020-07-19 ENCOUNTER — Encounter: Payer: Self-pay | Admitting: Emergency Medicine

## 2020-07-19 ENCOUNTER — Emergency Department: Payer: BC Managed Care – PPO

## 2020-07-19 ENCOUNTER — Emergency Department
Admission: EM | Admit: 2020-07-19 | Discharge: 2020-07-19 | Disposition: A | Discharge status: Home / Self Care | Payer: BC Managed Care – PPO | Attending: Emergency Medicine | Admitting: Emergency Medicine

## 2020-07-19 ENCOUNTER — Other Ambulatory Visit: Payer: Self-pay

## 2020-07-19 DIAGNOSIS — Y93I9 Activity, other involving external motion: Secondary | ICD-10-CM | POA: Insufficient documentation

## 2020-07-19 DIAGNOSIS — Z87891 Personal history of nicotine dependence: Secondary | ICD-10-CM | POA: Insufficient documentation

## 2020-07-19 DIAGNOSIS — S098XXA Other specified injuries of head, initial encounter: Secondary | ICD-10-CM | POA: Insufficient documentation

## 2020-07-19 DIAGNOSIS — S0003XA Contusion of scalp, initial encounter: Secondary | ICD-10-CM | POA: Insufficient documentation

## 2020-07-19 DIAGNOSIS — Z79899 Other long term (current) drug therapy: Secondary | ICD-10-CM | POA: Diagnosis not present

## 2020-07-19 DIAGNOSIS — Y92410 Unspecified street and highway as the place of occurrence of the external cause: Secondary | ICD-10-CM | POA: Insufficient documentation

## 2020-07-19 DIAGNOSIS — Y999 Unspecified external cause status: Secondary | ICD-10-CM | POA: Diagnosis not present

## 2020-07-19 DIAGNOSIS — Z86711 Personal history of pulmonary embolism: Secondary | ICD-10-CM | POA: Insufficient documentation

## 2020-07-19 DIAGNOSIS — I10 Essential (primary) hypertension: Secondary | ICD-10-CM | POA: Insufficient documentation

## 2020-07-19 DIAGNOSIS — E119 Type 2 diabetes mellitus without complications: Secondary | ICD-10-CM | POA: Diagnosis not present

## 2020-07-19 DIAGNOSIS — J45909 Unspecified asthma, uncomplicated: Secondary | ICD-10-CM | POA: Insufficient documentation

## 2020-07-19 DIAGNOSIS — S40012A Contusion of left shoulder, initial encounter: Secondary | ICD-10-CM | POA: Diagnosis not present

## 2020-07-19 DIAGNOSIS — S0990XA Unspecified injury of head, initial encounter: Secondary | ICD-10-CM

## 2020-07-19 DIAGNOSIS — Z960 Presence of urogenital implants: Secondary | ICD-10-CM | POA: Insufficient documentation

## 2020-07-19 DIAGNOSIS — E039 Hypothyroidism, unspecified: Secondary | ICD-10-CM | POA: Insufficient documentation
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
## 2020-07-19 NOTE — Discharge Instructions (Addendum)
Follow-up with your primary care provider if any continued problems or concerns.  You may continue taking your regular medication at home as instructed by your doctor.  Apply ice to your shoulder and scalp as needed for swelling and discomfort.

## 2020-07-19 NOTE — ED Notes (Signed)
Pt verbalizes understanding of d/c instructions and follow up. 

## 2020-07-22 ENCOUNTER — Telehealth: Payer: Self-pay

## 2020-07-22 NOTE — Telephone Encounter (Signed)
Per Karma Greaser, MRI has been approved from 07/22/2020 to 01/17/2021 Auth # [id number]

## 2020-07-23 ENCOUNTER — Encounter: Payer: Self-pay | Admitting: Podiatry

## 2020-07-24 ENCOUNTER — Telehealth: Payer: Self-pay

## 2020-07-24 ENCOUNTER — Other Ambulatory Visit: Payer: Self-pay

## 2020-07-24 ENCOUNTER — Ambulatory Visit (HOSPITAL_COMMUNITY)
Admission: RE | Admit: 2020-07-24 | Discharge: 2020-07-24 | Disposition: A | Discharge status: Home / Self Care | Payer: BC Managed Care – PPO | Source: Ambulatory Visit | Attending: Podiatry | Admitting: Podiatry

## 2020-07-24 DIAGNOSIS — I82402 Acute embolism and thrombosis of unspecified deep veins of left lower extremity: Secondary | ICD-10-CM | POA: Diagnosis present

## 2020-07-24 MED ORDER — METHYLPREDNISOLONE 4 MG PO TABS
ORAL_TABLET | ORAL | 0 refills | Status: DC
Start: 2020-07-24 — End: 2020-10-11

## 2020-07-24 NOTE — Telephone Encounter (Signed)
Patient notified of medication and will pick up at pharmacy

## 2020-07-24 NOTE — Telephone Encounter (Signed)
-----   Message from Garrel Ridgel, [place] sent at 07/24/2020  7:03 AM EDT ----- She needs an ultrasound of that that calf ASAP her D-dimer is elevated and may be the reason that leg hurts so badly.

## 2020-07-24 NOTE — Telephone Encounter (Signed)
Order has been sent stat to [facility] for venous DVT doppler Patient has been notified and has appt at [facility] office at 11am today

## 2020-07-24 NOTE — Telephone Encounter (Signed)
-----   Message from Garrel Ridgel, [place] sent at 07/24/2020 11:51 AM EDT ----- No dvt

## 2020-07-24 NOTE — Telephone Encounter (Signed)
Patient notified of results.

## 2020-07-30 ENCOUNTER — Ambulatory Visit
Admission: RE | Admit: 2020-07-30 | Discharge: 2020-07-30 | Disposition: A | Discharge status: Home / Self Care | Payer: BC Managed Care – PPO | Source: Ambulatory Visit | Attending: Podiatry | Admitting: Podiatry

## 2020-07-30 ENCOUNTER — Other Ambulatory Visit: Payer: Self-pay

## 2020-07-30 DIAGNOSIS — S86012A Strain of left Achilles tendon, initial encounter: Secondary | ICD-10-CM | POA: Insufficient documentation

## 2020-08-01 ENCOUNTER — Encounter: Payer: Self-pay | Admitting: Podiatry

## 2020-08-01 ENCOUNTER — Ambulatory Visit (INDEPENDENT_AMBULATORY_CARE_PROVIDER_SITE_OTHER): Payer: BC Managed Care – PPO | Admitting: Podiatry

## 2020-08-01 ENCOUNTER — Other Ambulatory Visit: Payer: Self-pay

## 2020-08-01 DIAGNOSIS — M84375A Stress fracture, left foot, initial encounter for fracture: Secondary | ICD-10-CM

## 2020-08-01 NOTE — Progress Notes (Signed)
She presents today for follow-up of her MRI results states that she still has severe pain in the left foot that she presents today in flip-flops.  Objective: Vital signs are stable she is alert oriented x3 there is no erythema cellulitis drainage odor she has mild edema to the left foot.  Exquisite pain on palpation medial lateral compression of the left calcaneus she also has pain on palpation of the sinus tarsi left.  MRI confirms a fracture of the calcaneal tubercle.  Plan: Discussed etiology pathology conservative surgical therapies at this point time recommended that she stay in her cam walker and I will follow-up with her in 3 to 4 weeks for reevaluation.  X-rays will be taken at that time.

## 2020-08-14 ENCOUNTER — Ambulatory Visit: Payer: BC Managed Care – PPO | Admitting: Podiatry

## 2020-08-21 ENCOUNTER — Ambulatory Visit: Payer: BC Managed Care – PPO | Admitting: Podiatry

## 2020-09-04 ENCOUNTER — Ambulatory Visit: Payer: BC Managed Care – PPO | Admitting: Podiatry

## 2020-09-09 ENCOUNTER — Ambulatory Visit (INDEPENDENT_AMBULATORY_CARE_PROVIDER_SITE_OTHER): Payer: BC Managed Care – PPO

## 2020-09-09 ENCOUNTER — Other Ambulatory Visit: Payer: Self-pay

## 2020-09-09 ENCOUNTER — Ambulatory Visit (INDEPENDENT_AMBULATORY_CARE_PROVIDER_SITE_OTHER): Payer: BC Managed Care – PPO | Admitting: Podiatry

## 2020-09-09 ENCOUNTER — Encounter: Payer: Self-pay | Admitting: Podiatry

## 2020-09-09 DIAGNOSIS — M84375D Stress fracture, left foot, subsequent encounter for fracture with routine healing: Secondary | ICD-10-CM | POA: Diagnosis not present

## 2020-09-09 NOTE — Progress Notes (Signed)
She presents today for follow-up of her calcaneal fracture left.  She states that is doing better but is not well yet.  She has not been wearing her boot regularly.  She leaves for [place] next Tuesday.  Objective: Presents to the office today without her cam walker.  She is wearing flip-flops.  Vital signs are stable she is alert and oriented x3.  Pulses are palpable.  She still has pain on medial lateral compression of the calcaneus left with mild edema.  Radiographically demonstrates healing calcaneal tubercle fracture however the line of fracture appears to be traveling through the body of the calcaneus at this point.  I did not see this on previous evaluation.  Assessment slowly healing calcaneal fracture left.  Plan: Encouraged her to wear her boot as much as possible.  Encouraged her to wear her boot degrees.  Follow-up with her in 6 weeks for another set of x-rays

## 2020-10-08 ENCOUNTER — Inpatient Hospital Stay: Payer: BC Managed Care – PPO | Admitting: Internal Medicine

## 2020-10-08 ENCOUNTER — Inpatient Hospital Stay: Payer: BC Managed Care – PPO

## 2020-10-08 NOTE — Progress Notes (Deleted)
[facility] NOTE  Patient Care Team: Audelia Acton, MD as PCP - General (Internal Medicine)  CHIEF COMPLAINTS/PURPOSE OF CONSULTATION:  ***  #  Oncology History   No history exists.     HISTORY OF PRESENTING ILLNESS:  Courtney Murphy 64 y.o.  female    Review of Systems  Constitutional: Negative for chills, diaphoresis, fever, malaise/fatigue and weight loss.  HENT: Negative for nosebleeds and sore throat.   Eyes: Negative for double vision.  Respiratory: Negative for cough, hemoptysis, sputum production, shortness of breath and wheezing.   Cardiovascular: Negative for chest pain, palpitations, orthopnea and leg swelling.  Gastrointestinal: Negative for abdominal pain, blood in stool, constipation, diarrhea, heartburn, melena, nausea and vomiting.  Genitourinary: Negative for dysuria, frequency and urgency.  Musculoskeletal: Negative for back pain and joint pain.  Skin: Negative.  Negative for itching and rash.  Neurological: Negative for dizziness, tingling, focal weakness, weakness and headaches.  Endo/Heme/Allergies: Does not bruise/bleed easily.  Psychiatric/Behavioral: Negative for depression. The patient is not nervous/anxious and does not have insomnia.      MEDICAL HISTORY:  Past Medical History:  Diagnosis Date  . Acid reflux 10/11/2015  . Anemia    RECEIVES IRON INFUSIONS  . Arthritis   . Asthma    WELL CONTROLLED  . Depression   . Diabetes mellitus without complication ([place])   . Fatty liver   . Gout   . History of kidney stones   . History of methicillin resistant staphylococcus aureus (MRSA) 2016  . Hypertension    H/O BEEN OFF BP MEDS FOR 1 YEAR  . Hypothyroidism   . Kidney stone   . Knee pain    Left  . Pulmonary emboli ([place]) 2011  . Restless leg syndrome   . Shoulder pain     SURGICAL HISTORY: Past Surgical History:  Procedure Laterality Date  . ABDOMINAL HYSTERECTOMY    . BACK SURGERY    . BREAST REDUCTION  SURGERY    . CATARACT EXTRACTION W/PHACO Left 07/28/2017   Procedure: CATARACT EXTRACTION PHACO AND INTRAOCULAR LENS PLACEMENT ([place]) diabetic Left;  Surgeon: Leandrew Koyanagi, MD;  Location: [facility];  Service: Ophthalmology;  Laterality: Left;  Diabetic  . CESAREAN SECTION  1990  . CHOLECYSTECTOMY    . CYSTOSCOPY/URETEROSCOPY/HOLMIUM LASER/STENT PLACEMENT Left 09/08/2017   Procedure: CYSTOSCOPY/URETEROSCOPY/HOLMIUM LASER/STENT EXCHANGE;  Surgeon: Hollice Espy, MD;  Location: [facility];  Service: Urology;  Laterality: Left;  . CYSTOSCOPY/URETEROSCOPY/HOLMIUM LASER/STENT PLACEMENT Left 12/14/2018   Procedure: CYSTOSCOPY/URETEROSCOPY/HOLMIUM LASER/STENT PLACEMENT;  Surgeon: Hollice Espy, MD;  Location: [facility];  Service: Urology;  Laterality: Left;  . FOOT SURGERY    . GASTRIC BYPASS    . IR NEPHROSTOMY PLACEMENT LEFT  08/17/2017  . KNEE ARTHROSCOPY Left   . KNEE ARTHROSCOPY Left 07/19/2015   Procedure: ARTHROSCOPY KNEE;  Surgeon: Leanor Kail, MD;  Location: [facility];  Service: Orthopedics;  Laterality: Left;  . NEPHROLITHOTOMY Left 08/17/2017   Procedure: NEPHROLITHOTOMY PERCUTANEOUS WITH HOLMIUM LASER;  Surgeon: Hollice Espy, MD;  Location: [facility];  Service: Urology;  Laterality: Left;  . THUMB ARTHROSCOPY    . TONSILLECTOMY      SOCIAL HISTORY: Social History   Socioeconomic History  . Marital status: Widowed    Spouse name: Not on file  . Number of children: Not on file  . Years of education: Not on file  . Highest education level: Not on file  Occupational History  . Not on file  Tobacco Use  . Smoking status:  Former Smoker    Packs/day: 0.50    Years: 4.00    Pack years: 2.00    Types: Cigarettes    Quit date: 04/14/2015    Years since quitting: 5.4  . Smokeless tobacco: Never Used  Vaping Use  . Vaping Use: Never used  Substance and Sexual Activity  . Alcohol use: Not Currently    Comment: RARE  . Drug use: No  . Sexual activity: Never   Other Topics Concern  . Not on file  Social History Narrative  . Not on file   Social Determinants of Health   Financial Resource Strain:   . Difficulty of Paying Living Expenses: Not on file  Food Insecurity:   . Worried About Charity fundraiser in the Last Year: Not on file  . Ran Out of Food in the Last Year: Not on file  Transportation Needs:   . Lack of Transportation (Medical): Not on file  . Lack of Transportation (Non-Medical): Not on file  Physical Activity:   . Days of Exercise per Week: Not on file  . Minutes of Exercise per Session: Not on file  Stress:   . Feeling of Stress : Not on file  Social Connections:   . Frequency of Communication with Friends and Family: Not on file  . Frequency of Social Gatherings with Friends and Family: Not on file  . Attends Religious Services: Not on file  . Active Member of Clubs or Organizations: Not on file  . Attends Archivist Meetings: Not on file  . Marital Status: Not on file  Intimate Partner Violence:   . Fear of Current or Ex-Partner: Not on file  . Emotionally Abused: Not on file  . Physically Abused: Not on file  . Sexually Abused: Not on file    FAMILY HISTORY: Family History  Problem Relation Age of Onset  . Cancer Mother        breast  . Heart disease Father   . Diabetes Father   . Hypertension Father   . Cancer Maternal Grandmother   . Cancer Maternal Grandfather   . Heart disease Paternal Grandmother   . Heart disease Paternal Grandfather   . Cancer Maternal Aunt   . Cancer Maternal Uncle   . Heart disease Paternal Aunt   . Heart disease Paternal Uncle   . Kidney cancer Neg Hx   . Bladder Cancer Neg Hx     ALLERGIES:  is allergic to sulfasalazine, nsaids, sulfa antibiotics, and penicillins.  MEDICATIONS:  Current Outpatient Medications  Medication Sig Dispense Refill  . atomoxetine (STRATTERA) 40 MG capsule     . atorvastatin (LIPITOR) 20 MG tablet Take 20 mg by mouth every morning.      . cyanocobalamin (,VITAMIN B-12,) 1000 MCG/ML injection Inject 1,000 mcg into the muscle once a week.    . cyclobenzaprine (FLEXERIL) 10 MG tablet Take 10 mg by mouth every 8 (eight) hours.    . Dulaglutide (TRULICITY) 5.72 IO/0.3TD SOPN Inject 0.75 mg into the skin every Thursday.     . escitalopram (LEXAPRO) 10 MG tablet Take 20 mg by mouth every morning.     . gabapentin (NEURONTIN) 400 MG capsule Take 400 mg by mouth 4 (four) times daily as needed (neuropathy).     Marland Kitchen HYDROcodone-acetaminophen (NORCO/VICODIN) 5-325 MG tablet Take 1 tablet by mouth 3 (three) times daily as needed.    Marland Kitchen levothyroxine (SYNTHROID, LEVOTHROID) 100 MCG tablet Take 100 mcg by mouth daily before breakfast.    .  lisinopril (ZESTRIL) 20 MG tablet Take 20 mg by mouth daily.    . meloxicam (MOBIC) 15 MG tablet Take 15 mg by mouth daily.    . methylPREDNISolone (MEDROL DOSEPAK) 4 MG TBPK tablet 6 day dose pack - take as directed 21 tablet 0  . methylPREDNISolone (MEDROL) 4 MG tablet Take as directed 21 tablet 0  . pramipexole (MIRAPEX) 0.5 MG tablet Take 0.5-1 mg by mouth at bedtime.     . traMADol (ULTRAM) 50 MG tablet Take 1 tablet (50 mg total) by mouth every 6 (six) hours as needed. 20 tablet 0   No current facility-administered medications for this visit.      Marland Kitchen  PHYSICAL EXAMINATION: ECOG PERFORMANCE STATUS: {CHL ONC ECOG PS:[id number]}  There were no vitals filed for this visit. There were no vitals filed for this visit.  Physical Exam HENT:     Head: Normocephalic and atraumatic.     Mouth/Throat:     Pharynx: No oropharyngeal exudate.  Eyes:     Pupils: Pupils are equal, round, and reactive to light.  Cardiovascular:     Rate and Rhythm: Normal rate and regular rhythm.  Pulmonary:     Effort: Pulmonary effort is normal. No respiratory distress.     Breath sounds: Normal breath sounds. No wheezing.  Abdominal:     General: Bowel sounds are normal. There is no distension.     Palpations:  Abdomen is soft. There is no mass.     Tenderness: There is no abdominal tenderness. There is no guarding or rebound.  Musculoskeletal:        General: No tenderness. Normal range of motion.     Cervical back: Normal range of motion and neck supple.  Skin:    General: Skin is warm.  Neurological:     Mental Status: She is alert and oriented to person, place, and time.  Psychiatric:        Mood and Affect: Affect normal.      LABORATORY DATA:  I have reviewed the data as listed Lab Results  Component Value Date   WBC 10.1 05/26/2020   HGB 11.1 (L) 05/26/2020   HCT 33.0 (L) 05/26/2020   MCV 92.4 05/26/2020   PLT 275 05/26/2020   Recent Labs    11/25/19 1423 05/26/20 1545 07/17/20 1242  NA 141 138 143  K 3.9 4.0 4.9  CL 104 101 103  CO2 23 24 26   GLUCOSE 109* 265* 145*  BUN 16 17 10   CREATININE 0.72 0.94 0.84  CALCIUM 9.3 8.8* 9.5  GFRNONAA >60 >60 74  GFRAA >60 >60 85  PROT  --   --  6.6  ALBUMIN  --   --  4.4  AST  --   --  16  ALT  --   --  20  ALKPHOS  --   --  168*  BILITOT  --   --  0.3    RADIOGRAPHIC STUDIES: I have personally reviewed the radiological images as listed and agreed with the findings in the report. DG Foot Complete Left  Result Date: 09/09/2020 Please see detailed radiograph report in office note.   ASSESSMENT & PLAN:   No problem-specific Assessment & Plan notes found for this encounter.    All questions were answered. The patient knows to call the clinic with any problems, questions or concerns.       Cammie Sickle, MD 10/08/2020 8:42 AM

## 2020-10-11 ENCOUNTER — Inpatient Hospital Stay: Payer: BC Managed Care – PPO

## 2020-10-11 ENCOUNTER — Other Ambulatory Visit: Payer: Self-pay

## 2020-10-11 ENCOUNTER — Encounter: Payer: Self-pay | Admitting: Internal Medicine

## 2020-10-11 ENCOUNTER — Inpatient Hospital Stay: Payer: BC Managed Care – PPO | Attending: Internal Medicine | Admitting: Internal Medicine

## 2020-10-11 VITALS — BP 140/83 | HR 101 | Temp 97.5°F | Resp 16 | Ht 64.0 in | Wt 183.0 lb

## 2020-10-11 DIAGNOSIS — R233 Spontaneous ecchymoses: Secondary | ICD-10-CM

## 2020-10-11 DIAGNOSIS — Z86711 Personal history of pulmonary embolism: Secondary | ICD-10-CM

## 2020-10-11 DIAGNOSIS — D6861 Antiphospholipid syndrome: Secondary | ICD-10-CM

## 2020-10-11 DIAGNOSIS — K909 Intestinal malabsorption, unspecified: Secondary | ICD-10-CM

## 2020-10-11 DIAGNOSIS — R760 Raised antibody titer: Secondary | ICD-10-CM

## 2020-10-11 DIAGNOSIS — Z7901 Long term (current) use of anticoagulants: Secondary | ICD-10-CM | POA: Diagnosis not present

## 2020-10-11 NOTE — Progress Notes (Signed)
Pt would like you to take a look at her arms. She has bruises that surface with no injury.

## 2020-10-11 NOTE — Progress Notes (Signed)
[facility] NOTE  Patient Care Team: Audelia Acton, MD as PCP - General (Internal Medicine)  CHIEF COMPLAINTS/PURPOSE OF CONSULTATION: DVT/PE  # PE [> 10-15 years] x 6 months of coumadin [3-4 months prior TAH]; No provoking causes; No Family Hx of blood clots; NO BCPs; no miscarriages;OCT 2021 [incidental rheumatologic work-up]-lupus anticoagulant-NEG; Beta2 IgG- 56 [H]; anticardiolipin-IgM -indeterminate [>13]  # Gastric By pass [lost 80 pounds; 2011]  # Joint pains [Dr.Patel; [facility] History   No history exists.     HISTORY OF PRESENTING ILLNESS:  Courtney Murphy 64 y.o.  female remote history of PE more than 10 to 15 years ago has been referred to [place] for further recommendations/evaluation of antiphospholipid antibody syndrome.  Patient noted to have worsening joint pains back pain which led to further evaluation with rheumatology.  As part of the work-up patient had antiphospholipid antibody work-up that was abnormal/summarized above.   Patient denies any recent blood clots.  No miscarriages.  Patient denies having any significant immobilization/or long distance travel or family history of blood clots or use of birth control pills prior to previous blood clot 10-15 years ago.  Patient is not currently on anticoagulation.  She also complains of easy bruising especially in her upper extremities.-Only with minimal trauma.  No gum bleeding or nosebleeds.  No rectal bleeding no bleeding the joints.  Review of Systems  Constitutional: Positive for malaise/fatigue. Negative for chills, diaphoresis, fever and weight loss.  HENT: Negative for nosebleeds and sore throat.   Eyes: Negative for double vision.  Respiratory: Negative for cough, hemoptysis, sputum production, shortness of breath and wheezing.   Cardiovascular: Negative for chest pain, palpitations, orthopnea and leg swelling.  Gastrointestinal: Negative for abdominal pain, blood in stool,  constipation, diarrhea, heartburn, melena, nausea and vomiting.  Genitourinary: Negative for dysuria, frequency and urgency.  Musculoskeletal: Positive for back pain and joint pain.  Skin: Negative.  Negative for itching and rash.  Neurological: Negative for dizziness, tingling, focal weakness, weakness and headaches.  Endo/Heme/Allergies: Bruises/bleeds easily.  Psychiatric/Behavioral: Negative for depression. The patient is not nervous/anxious and does not have insomnia.      MEDICAL HISTORY:  Past Medical History:  Diagnosis Date  . Acid reflux 10/11/2015  . Anemia    RECEIVES IRON INFUSIONS  . Arthritis   . Asthma    WELL CONTROLLED  . Depression   . Diabetes mellitus without complication ([place])   . Fatty liver   . Gout   . History of kidney stones   . History of methicillin resistant staphylococcus aureus (MRSA) 2016  . Hypertension    H/O BEEN OFF BP MEDS FOR 1 YEAR  . Hypothyroidism   . Kidney stone   . Knee pain    Left  . Pulmonary emboli ([place]) 2011  . Restless leg syndrome   . Shoulder pain     SURGICAL HISTORY: Past Surgical History:  Procedure Laterality Date  . ABDOMINAL HYSTERECTOMY    . BACK SURGERY    . BREAST REDUCTION SURGERY    . CATARACT EXTRACTION W/PHACO Left 07/28/2017   Procedure: CATARACT EXTRACTION PHACO AND INTRAOCULAR LENS PLACEMENT ([place]) diabetic Left;  Surgeon: Leandrew Koyanagi, MD;  Location: [facility];  Service: Ophthalmology;  Laterality: Left;  Diabetic  . CESAREAN SECTION  1990  . CHOLECYSTECTOMY    . CYSTOSCOPY/URETEROSCOPY/HOLMIUM LASER/STENT PLACEMENT Left 09/08/2017   Procedure: CYSTOSCOPY/URETEROSCOPY/HOLMIUM LASER/STENT EXCHANGE;  Surgeon: Hollice Espy, MD;  Location: [facility];  Service: Urology;  Laterality: Left;  .  CYSTOSCOPY/URETEROSCOPY/HOLMIUM LASER/STENT PLACEMENT Left 12/14/2018   Procedure: CYSTOSCOPY/URETEROSCOPY/HOLMIUM LASER/STENT PLACEMENT;  Surgeon: Hollice Espy, MD;  Location: [facility];  Service:  Urology;  Laterality: Left;  . FOOT SURGERY    . GASTRIC BYPASS    . IR NEPHROSTOMY PLACEMENT LEFT  08/17/2017  . KNEE ARTHROSCOPY Left   . KNEE ARTHROSCOPY Left 07/19/2015   Procedure: ARTHROSCOPY KNEE;  Surgeon: Leanor Kail, MD;  Location: [facility];  Service: Orthopedics;  Laterality: Left;  . NEPHROLITHOTOMY Left 08/17/2017   Procedure: NEPHROLITHOTOMY PERCUTANEOUS WITH HOLMIUM LASER;  Surgeon: Hollice Espy, MD;  Location: [facility];  Service: Urology;  Laterality: Left;  . THUMB ARTHROSCOPY    . TONSILLECTOMY      SOCIAL HISTORY: Social History   Socioeconomic History  . Marital status: Widowed    Spouse name: Not on file  . Number of children: Not on file  . Years of education: Not on file  . Highest education level: Not on file  Occupational History  . Not on file  Tobacco Use  . Smoking status: Former Smoker    Packs/day: 0.50    Years: 4.00    Pack years: 2.00    Types: Cigarettes    Quit date: 04/14/2015    Years since quitting: 5.4  . Smokeless tobacco: Never Used  Vaping Use  . Vaping Use: Never used  Substance and Sexual Activity  . Alcohol use: Not Currently    Comment: RARE  . Drug use: No  . Sexual activity: Never  Other Topics Concern  . Not on file  Social History Narrative   Quit smoking > 30 years ago; rare alcohol. Used to worked at [employer]; receptionist at [employer]. Lives in [place]; with dog.    Social Determinants of Health   Financial Resource Strain:   . Difficulty of Paying Living Expenses: Not on file  Food Insecurity:   . Worried About Charity fundraiser in the Last Year: Not on file  . Ran Out of Food in the Last Year: Not on file  Transportation Needs:   . Lack of Transportation (Medical): Not on file  . Lack of Transportation (Non-Medical): Not on file  Physical Activity:   . Days of Exercise per Week: Not on file  . Minutes of Exercise per Session: Not on file  Stress:   . Feeling of Stress : Not  on file  Social Connections:   . Frequency of Communication with Friends and Family: Not on file  . Frequency of Social Gatherings with Friends and Family: Not on file  . Attends Religious Services: Not on file  . Active Member of Clubs or Organizations: Not on file  . Attends Archivist Meetings: Not on file  . Marital Status: Not on file  Intimate Partner Violence:   . Fear of Current or Ex-Partner: Not on file  . Emotionally Abused: Not on file  . Physically Abused: Not on file  . Sexually Abused: Not on file    FAMILY HISTORY: Family History  Problem Relation Age of Onset  . Cancer Mother        breast  . Heart disease Father   . Diabetes Father   . Hypertension Father   . Cancer Maternal Grandmother   . Cancer Maternal Grandfather   . Heart disease Paternal Grandmother   . Heart disease Paternal Grandfather   . Cancer Maternal Aunt   . Cancer Maternal Uncle   . Heart disease Paternal Aunt   .  Heart disease Paternal Uncle   . Kidney cancer Neg Hx   . Bladder Cancer Neg Hx     ALLERGIES:  is allergic to sulfasalazine, nsaids, sulfa antibiotics, and penicillins.  MEDICATIONS:  Current Outpatient Medications  Medication Sig Dispense Refill  . atomoxetine (STRATTERA) 40 MG capsule     . atorvastatin (LIPITOR) 20 MG tablet Take 20 mg by mouth every morning.     . cyanocobalamin (,VITAMIN B-12,) 1000 MCG/ML injection Inject 1,000 mcg into the muscle once a week.    . cyclobenzaprine (FLEXERIL) 10 MG tablet Take 10 mg by mouth every 8 (eight) hours.    . Dulaglutide (TRULICITY) 9.47 ML/4.6TK SOPN Inject 0.75 mg into the skin every Thursday.     . escitalopram (LEXAPRO) 10 MG tablet Take 20 mg by mouth every morning.     . gabapentin (NEURONTIN) 400 MG capsule Take 400 mg by mouth 4 (four) times daily as needed (neuropathy).     Marland Kitchen HYDROcodone-acetaminophen (NORCO/VICODIN) 5-325 MG tablet Take 1 tablet by mouth 3 (three) times daily as needed.    Marland Kitchen levothyroxine  (SYNTHROID, LEVOTHROID) 100 MCG tablet Take 100 mcg by mouth daily before breakfast.    . lisinopril (ZESTRIL) 20 MG tablet Take 20 mg by mouth daily.    . pramipexole (MIRAPEX) 0.5 MG tablet Take 0.5-1 mg by mouth at bedtime.      No current facility-administered medications for this visit.      Marland Kitchen  PHYSICAL EXAMINATION:  Vitals:   10/11/20 1354  BP: 140/83  Pulse: (!) 101  Resp: 16  Temp: (!) 97.5 F (36.4 C)  SpO2: 98%   Filed Weights   10/11/20 1354  Weight: 183 lb (83 kg)    Physical Exam HENT:     Head: Normocephalic and atraumatic.     Mouth/Throat:     Pharynx: No oropharyngeal exudate.  Eyes:     Pupils: Pupils are equal, round, and reactive to light.  Cardiovascular:     Rate and Rhythm: Normal rate and regular rhythm.  Pulmonary:     Effort: Pulmonary effort is normal. No respiratory distress.     Breath sounds: Normal breath sounds. No wheezing.  Abdominal:     General: Bowel sounds are normal. There is no distension.     Palpations: Abdomen is soft. There is no mass.     Tenderness: There is no abdominal tenderness. There is no guarding or rebound.  Musculoskeletal:        General: No tenderness. Normal range of motion.     Cervical back: Normal range of motion and neck supple.  Skin:    General: Skin is warm.     Comments: Chronic bruising noted on the upper extremities.  No bruises on the trunk or lower extremities.  Neurological:     Mental Status: She is alert and oriented to person, place, and time.  Psychiatric:        Mood and Affect: Affect normal.      LABORATORY DATA:  I have reviewed the data as listed Lab Results  Component Value Date   WBC 10.1 05/26/2020   HGB 11.1 (L) 05/26/2020   HCT 33.0 (L) 05/26/2020   MCV 92.4 05/26/2020   PLT 275 05/26/2020   Recent Labs    11/25/19 1423 05/26/20 1545 07/17/20 1242  NA 141 138 143  K 3.9 4.0 4.9  CL 104 101 103  CO2 23 24 26   GLUCOSE 109* 265* 145*  BUN 16 17 10  CREATININE  0.72 0.94 0.84  CALCIUM 9.3 8.8* 9.5  GFRNONAA >60 >60 74  GFRAA >60 >60 85  PROT  --   --  6.6  ALBUMIN  --   --  4.4  AST  --   --  16  ALT  --   --  20  ALKPHOS  --   --  168*  BILITOT  --   --  0.3    RADIOGRAPHIC STUDIES: I have personally reviewed the radiological images as listed and agreed with the findings in the report. No results found.  ASSESSMENT & PLAN:   Antiphospholipid antibody positive #-anticardiolipin IgG [mildly positive]; beta-2 glycoprotein IgM positive [Incidental; rheumatology].  Prior history of PE 10-15 years ago; currently on anticoagulation.  No history of miscarriages.  #Discussed it is unclear to me if the antiphospholipid antibody panel is true positive versus false positive especially lupus anticoagulant is negative.  I would not recommend changing the course of current treatment plan at this time.  I would not recommend any anticoagulation at this time.  However I would recommend repeating labs in 3 months.  #Easy bruising-only on the upper extremities/with minimal trauma.  No gum bleed no nosebleeds.  Question frequent steroids/aging; do not suspect any serious bleeding diathesis.  Check PT PTT; platelet function assay-next labs.  Thank you Dr.Patel for allowing me to participate in the care of your pleasant patient. Please do not hesitate to contact me with questions or concerns in the interim.  Discussed with Dr. Posey Pronto.  # DISPOSITION: # labs [ordered] in 3rd week of jan 2022;  2 weeks later-Follow up MD-No labs-Dr.B  All questions were answered. The patient knows to call the clinic with any problems, questions or concerns.   Cammie Sickle, MD 10/11/2020 3:32 PM

## 2020-10-11 NOTE — Assessment & Plan Note (Addendum)
#-  anticardiolipin IgG [mildly positive]; beta-2 glycoprotein IgM positive [Incidental; rheumatology].  Prior history of PE 10-15 years ago; currently on anticoagulation.  No history of miscarriages.  #Discussed it is unclear to me if the antiphospholipid antibody panel is true positive versus false positive especially lupus anticoagulant is negative.  I would not recommend changing the course of current treatment plan at this time.  I would not recommend any anticoagulation at this time.  However I would recommend repeating labs in 3 months.  #Easy bruising-only on the upper extremities/with minimal trauma.  No gum bleed no nosebleeds.  Question frequent steroids/aging; do not suspect any serious bleeding diathesis.  Check PT PTT; platelet function assay-next labs.  #History of gastric bypass-check iron studies; vitamin D vitamin K/see above.  Thank you Dr.Patel for allowing me to participate in the care of your pleasant patient. Please do not hesitate to contact me with questions or concerns in the interim.  Discussed with Dr. Posey Pronto.  # DISPOSITION: # labs [ordered] in 3rd week of jan 2022;  2 weeks later-Follow up MD-No labs-Dr.B

## 2020-10-17 ENCOUNTER — Encounter: Payer: Self-pay | Admitting: Podiatry

## 2020-10-21 ENCOUNTER — Other Ambulatory Visit: Payer: Self-pay

## 2020-10-21 ENCOUNTER — Ambulatory Visit (INDEPENDENT_AMBULATORY_CARE_PROVIDER_SITE_OTHER): Payer: BC Managed Care – PPO | Admitting: Podiatry

## 2020-10-21 ENCOUNTER — Ambulatory Visit (INDEPENDENT_AMBULATORY_CARE_PROVIDER_SITE_OTHER): Payer: BC Managed Care – PPO

## 2020-10-21 DIAGNOSIS — S92015D Nondisplaced fracture of body of left calcaneus, subsequent encounter for fracture with routine healing: Secondary | ICD-10-CM

## 2020-10-21 NOTE — Progress Notes (Signed)
She presents today for follow-up of her stress fracture left heel.  Denies fever chills nausea vomiting muscle aches pains calf pain back pain chest pain shortness of breath.  She states that her heels doing much better even after her trip to [place].  Objective: Vital signs are stable alert oriented x3 there is no erythema edema cellulitis drainage odor she has some tenderness on palpation of lateral portion of the body of the calcaneus.  Radiographically does not appear to be a fracture line present on these views today.  Assessment: Resolving cancellous fracture of the calcaneus.  Plan: Follow-up with me on an as-needed basis.

## 2020-10-23 DIAGNOSIS — M79676 Pain in unspecified toe(s): Secondary | ICD-10-CM

## 2020-11-13 ENCOUNTER — Ambulatory Visit (INDEPENDENT_AMBULATORY_CARE_PROVIDER_SITE_OTHER): Payer: BC Managed Care – PPO

## 2020-11-13 ENCOUNTER — Encounter: Payer: Self-pay | Admitting: Podiatry

## 2020-11-13 ENCOUNTER — Other Ambulatory Visit: Payer: Self-pay | Admitting: Podiatry

## 2020-11-13 ENCOUNTER — Ambulatory Visit (INDEPENDENT_AMBULATORY_CARE_PROVIDER_SITE_OTHER): Payer: BC Managed Care – PPO | Admitting: Podiatry

## 2020-11-13 ENCOUNTER — Encounter: Payer: Self-pay | Admitting: *Deleted

## 2020-11-13 ENCOUNTER — Other Ambulatory Visit: Payer: Self-pay

## 2020-11-13 DIAGNOSIS — S92342A Displaced fracture of fourth metatarsal bone, left foot, initial encounter for closed fracture: Secondary | ICD-10-CM

## 2020-11-13 DIAGNOSIS — E1142 Type 2 diabetes mellitus with diabetic polyneuropathy: Secondary | ICD-10-CM | POA: Diagnosis not present

## 2020-11-13 DIAGNOSIS — M14672 Charcot's joint, left ankle and foot: Secondary | ICD-10-CM

## 2020-11-13 DIAGNOSIS — E11610 Type 2 diabetes mellitus with diabetic neuropathic arthropathy: Secondary | ICD-10-CM

## 2020-11-13 DIAGNOSIS — S92015D Nondisplaced fracture of body of left calcaneus, subsequent encounter for fracture with routine healing: Secondary | ICD-10-CM

## 2020-11-13 NOTE — Progress Notes (Signed)
  Subjective:  Patient ID: Courtney Murphy, female    DOB: Dec 29, 1955,  MRN: [id number]  Chief Complaint  Patient presents with  . Foot Pain    Patient presents today for pain and swelling in her left foot x 3 weeks now.  She says its a constant throb and some sharp pains at times.  She denies any injury to her foot    64 y.o. female presents with the above complaint. History confirmed with patient.  She previously has a history of calcaneal stress fracture treated by Dr. Milinda Pointer.  She noted some pain and swelling has been present for about 3 weeks.  Says she has a fixed her diabetes with diet and gastric bypass but due to some recent steroids her A1c has shot up again in is in the "about 7% range".  She does have some diabetic neuropathy.  Objective:  Physical Exam: warm, good capillary refill, no trophic changes or ulcerative lesions, normal DP and PT pulses and had abnormal monofilament exam. Left Foot: Edema erythema and tenderness over the lateral dorsal forefoot   Radiographs: X-ray of the left foot: Transverse fracture of the neck of the fourth metatarsal with collapse of articular cartilage of the third metatarsal, possible OCD of the second metatarsal head.  Review of previous x-rays looks like this was beginning to occur at last visit but had not completely fractured Assessment:   1. Charcot's joint of left foot   2. Closed displaced fracture of fourth metatarsal bone of left foot, initial encounter   3. Type 2 diabetes mellitus with diabetic polyneuropathy, without long-term current use of insulin ([place])      Plan:  Patient was evaluated and treated and all questions answered.  Patient educated on diabetes. Discussed proper diabetic foot care and discussed risks and complications of disease. Educated patient in depth on reasons to return to the office immediately should he/she discover anything concerning or new on the feet. All questions answered. Discussed proper shoes as well.    I discussed the etiology and treatment options of Charcot arthropathy with her which I think this is.  She has neuropathic fractures of the metatarsals.  We discussed surgical and nonsurgical treatment.  She is at high risk for the metatarsal neck fracture due to her cavus foot and neuropathy.  Likely for her I do not think this will be an area that will cause significant ulcer risk or skin compromise.  I recommend nonsurgical treatment for now.  A total contact fiberglass rigid cast was applied below the knee.  She is able to weight-bear and is in a cast boot.  Return in 1 week for cast change and new x-rays.  Return in about 1 week (around 11/20/2020).

## 2020-11-13 NOTE — Patient Instructions (Signed)
Neuropathic Arthropathy Neuropathic arthropathy is a condition that results from poor circulation and numbness in the foot and ankle. Poor blood supply and numbness can cause foot or ankle injuries that are too small to be noticed or treated (microtrauma). You may continue to walk on your damaged foot and make the condition worse. Poor circulation can also cause poor healing and bone weakness. Over time, this condition can lead to:  Broken bones.  Bone infection.  Joint damage or dislocation.  Deformities, such as a collapsed foot arch.  Disability of the foot or ankle. Neuropathic arthropathy is also called Charcot arthropathy or Charcot foot. What are the causes? This condition may be caused by any disease that damages nerves and blood vessels of the foot and ankle. Diabetes is the most common cause. In this case, neuropathic arthropathy may also be called diabetic foot.  Less common causes of this condition include:  Polio.  Leprosy.  Syphilis.  Spinal cord damage.  Long-term (chronic) alcoholism. What are the signs or symptoms? Early symptoms of this condition include:  Foot swelling without a known injury and with very little pain.  Warmth and redness. Later symptoms may include:  Foot deformity.  Ankle instability.  Foot sores (ulcers). How is this diagnosed? This condition may be diagnosed based on:  Your symptoms and medical history.  A physical exam.  Tests, such as: ? Blood tests to check for signs of infection or poorly controlled diabetes. ? X-rays of the ankle and foot to look for bone damage. ? Imaging tests to check for soft tissue or joint damage, such as an MRI or ultrasound. ? A bone scan to check for a bone infection. How is this treated? Treatment depends on the severity of the damage. If the damage is minor, treatment may include:  Wearing a brace, boot, or cast to protect and support the foot and ankle.  Using an assistive device such as a  walker, a knee walker, a wheelchair, or crutches to keep weight off the foot so it can heal. If you have a bone or soft tissue infection, you may receive antibiotics. More severe cases may require surgery to repair fractures, remove diseased bone or tissues, or reconstruct the foot or ankle. After treatment, you may need to wear a special cushioned shoe or boot for protection. Follow these instructions at home: If you have a brace, boot, or shoe:  Wear it as told by your health care provider. Remove it only as told by your health care provider.  Loosen it if your toes tingle, become numb, or turn cold and blue.  Keep it clean and dry. If you have a cast:  Do not put pressure on any part of the cast until it is fully hardened. This may take several hours.  Do not stick anything inside the cast to scratch your skin. Doing that increases your risk of infection.  Check the skin around the cast every day. Tell your health care provider about any concerns.  You may put lotion on dry skin around the edges of the cast. Do not put lotion on the skin underneath the cast.  Keep it clean and dry. Bathing If you have a cast, brace, boot, or shoe that is not waterproof:  Do not let it get wet.  Cover it with a watertight covering when you take a bath or shower. Activity   Return to your normal activities as told by your health care provider. Ask your health care provider what activities  are safe for you.  Avoid sitting for a long time without moving. Get up to take short walks every 1-2 hours. This is important to improve blood flow and breathing. Ask for help if you feel weak or unsteady.  Do not use the injured limb to support your body weight until your health care provider says that you can. Use a walker, a knee walker, a wheelchair, or crutches as told by your health care provider. General instructions      Check your feet every day for any redness, warmth, swelling, or  ulceration.  Do not walk barefooted. This could lead to injuries to your foot. Wear well-fitted footwear.  Take over-the-counter and prescription medicines only as told by your health care provider.  If you were prescribed an antibiotic medicine, take it as told by your health care provider. Do not stop using the antibiotic even if you start to feel better.  Do not use any products that contain nicotine or tobacco, such as cigarettes, e-cigarettes, and chewing tobacco. If you need help quitting, ask your health care provider.  Keep all follow-up visits as told by your health care provider. This is important. Contact a health care provider if you have:  A fever or chills.  Any new or worsening symptoms, such as redness, discharge, swelling, warmth, or ulcers on your foot.  Any problems with your cast, brace, or boot. Summary  Neuropathic arthropathy is a condition that results from poor circulation and numbness in the foot and ankle.  Poor blood supply and numbness can cause very small injuries (microtrauma) to your foot or ankle. Continuing to walk on your damaged foot can make the condition worse.  Any disease that damages nerves and blood vessels of the foot and ankle can cause neuropathic arthropathy. Diabetes is the most common cause.  Foot swelling without a known injury is the most common early symptom.  If the damage is minor, treatment may include wearing a brace, boot, or cast and using a walker, a knee walker, a wheelchair, or crutches. This information is not intended to replace advice given to you by your health care provider. Make sure you discuss any questions you have with your health care provider. Document Revised: 06/07/2019 Document Reviewed: 05/02/2019 Elsevier Patient Education  [employer].

## 2020-11-20 ENCOUNTER — Encounter: Payer: Self-pay | Admitting: Podiatry

## 2020-11-20 ENCOUNTER — Ambulatory Visit (INDEPENDENT_AMBULATORY_CARE_PROVIDER_SITE_OTHER): Payer: BC Managed Care – PPO

## 2020-11-20 ENCOUNTER — Ambulatory Visit (INDEPENDENT_AMBULATORY_CARE_PROVIDER_SITE_OTHER): Payer: BC Managed Care – PPO | Admitting: Podiatry

## 2020-11-20 DIAGNOSIS — S92342D Displaced fracture of fourth metatarsal bone, left foot, subsequent encounter for fracture with routine healing: Secondary | ICD-10-CM | POA: Diagnosis not present

## 2020-11-20 DIAGNOSIS — M14672 Charcot's joint, left ankle and foot: Secondary | ICD-10-CM

## 2020-11-20 DIAGNOSIS — M79676 Pain in unspecified toe(s): Secondary | ICD-10-CM

## 2020-11-22 ENCOUNTER — Encounter: Payer: Self-pay | Admitting: Podiatry

## 2020-11-22 NOTE — Progress Notes (Signed)
  Subjective:  Patient ID: Courtney Murphy, female    DOB: 02-26-1956,  MRN: [id number]  Chief Complaint  Patient presents with  . Fracture    64 y.o. female returns with the above complaint. History confirmed with patient.  Has been ambulating in detail contact cast with the cast boot.  She feels a little bit better feels like the swelling is gone down.  Cast has been comfortable.  Objective:  Physical Exam: warm, good capillary refill, no trophic changes or ulcerative lesions, normal DP and PT pulses and had abnormal monofilament exam. Left Foot: Edema improved since last visit.  Less pain now isolated to the fourth metatarsal neck   Radiographs: X-ray of the left foot: Fracture line still visible with no significant change since last radiographs 1 week ago fourth metatarsal neck.  No increased fragmentation noted in MTPJ's or midfoot Assessment:   1. Charcot's joint of left foot   2. Closed displaced fracture of fourth metatarsal bone of left foot with routine healing, subsequent encounter      Plan:  Patient was evaluated and treated and all questions answered.  Reviewed x-ray with patient.  We discussed fracture healing and that we will need continued immobilization and casting.  Clinically she does have improvement in edema and her symptoms with pain today.  Recommend we recast today and follow-up in 1 week.  No follow-ups on file.

## 2020-11-26 ENCOUNTER — Emergency Department
Admission: EM | Admit: 2020-11-26 | Discharge: 2020-11-26 | Disposition: A | Discharge status: Other Acute Inpatient Hospital | Payer: BC Managed Care – PPO

## 2020-11-27 ENCOUNTER — Other Ambulatory Visit: Payer: Self-pay

## 2020-11-27 ENCOUNTER — Ambulatory Visit (INDEPENDENT_AMBULATORY_CARE_PROVIDER_SITE_OTHER): Payer: BC Managed Care – PPO | Admitting: Podiatry

## 2020-11-27 DIAGNOSIS — M14672 Charcot's joint, left ankle and foot: Secondary | ICD-10-CM | POA: Diagnosis not present

## 2020-11-27 DIAGNOSIS — S92342D Displaced fracture of fourth metatarsal bone, left foot, subsequent encounter for fracture with routine healing: Secondary | ICD-10-CM | POA: Diagnosis not present

## 2020-11-27 DIAGNOSIS — E1142 Type 2 diabetes mellitus with diabetic polyneuropathy: Secondary | ICD-10-CM | POA: Diagnosis not present

## 2020-11-27 NOTE — Progress Notes (Signed)
  Subjective:  Patient ID: Courtney Murphy, female    DOB: [birth year],  MRN: [id number]  Chief Complaint  Patient presents with  . Foot Pain    Cast change   "its doing pretty good"    64 y.o. female returns with the above complaint. History confirmed with patient.  Has been ambulating in detail contact cast with the cast boot. Feels more swollen today  Objective:  Physical Exam: warm, good capillary refill, no trophic changes or ulcerative lesions, normal DP and PT pulses and had abnormal monofilament exam. Left Foot: Edema worsened since last visit. Still with pain pain now isolated to the fourth metatarsal neck. The second toenail has a blister at the proximal nail fold serous in nature   Radiographs: X-ray of the left foot: Fracture line still visible with no significant change since last radiographs 1 week ago fourth metatarsal neck.  No increased fragmentation noted in MTPJ's or midfoot Assessment:   No diagnosis found.   Plan:  Patient was evaluated and treated and all questions answered.  -Following prep of Betadine the blister was lanced a small amount of serous fluid was expressed. Nail does not need it removed it was well adhered -New total contact cast was applied today. Continue WBAT in cast boot -Was hoping to leave on for 2 weeks but with her worsening edema today and toe blister would like to reevaluate her in 1 week. Return for new x-rays next visit  Return in about 1 week (around 12/04/2020).

## 2020-11-28 ENCOUNTER — Encounter: Payer: Self-pay | Admitting: Podiatry

## 2020-12-04 ENCOUNTER — Other Ambulatory Visit: Payer: Self-pay

## 2020-12-04 ENCOUNTER — Ambulatory Visit: Payer: BC Managed Care – PPO | Admitting: Podiatry

## 2020-12-04 ENCOUNTER — Encounter: Payer: Self-pay | Admitting: Podiatry

## 2020-12-04 ENCOUNTER — Ambulatory Visit (INDEPENDENT_AMBULATORY_CARE_PROVIDER_SITE_OTHER): Payer: BC Managed Care – PPO

## 2020-12-04 DIAGNOSIS — S92335D Nondisplaced fracture of third metatarsal bone, left foot, subsequent encounter for fracture with routine healing: Secondary | ICD-10-CM | POA: Diagnosis not present

## 2020-12-04 DIAGNOSIS — S92342D Displaced fracture of fourth metatarsal bone, left foot, subsequent encounter for fracture with routine healing: Secondary | ICD-10-CM

## 2020-12-04 DIAGNOSIS — R600 Localized edema: Secondary | ICD-10-CM

## 2020-12-04 DIAGNOSIS — M14672 Charcot's joint, left ankle and foot: Secondary | ICD-10-CM

## 2020-12-04 DIAGNOSIS — E1142 Type 2 diabetes mellitus with diabetic polyneuropathy: Secondary | ICD-10-CM | POA: Diagnosis not present

## 2020-12-04 NOTE — Progress Notes (Signed)
  Subjective:  Patient ID: Courtney Murphy, female    DOB: 04-19-1956,  MRN: [id number]  Chief Complaint  Patient presents with  . Foot Pain    Charcot joint of the left foot, still has slight swelling but patient reports no pain at this time.     65 y.o. female returns with the above complaint. History confirmed with patient.  Has been ambulating in detail contact cast with the cast boot.  She still feel swollen.  Feels like the cast loosens over the week.  Objective:  Physical Exam: warm, good capillary refill, no trophic changes or ulcerative lesions, normal DP and PT pulses and had abnormal monofilament exam. Left Foot: Edema seems to be stable since last visit. Still with pain pain now isolated to the fourth metatarsal neck.  Second toenail blister has resolved   Radiographs: X-ray of the left foot: Fourth metatarsal head has fragmented and fractured with visible bone callus through the fourth metatarsal now.  Third metatarsal with collapse appears to be stable Assessment:   1. Charcot's joint of left foot   2. Closed displaced fracture of fourth metatarsal bone of left foot with routine healing, subsequent encounter   3. Type 2 diabetes mellitus with diabetic polyneuropathy, without long-term current use of insulin ([facility])   4. Edema of left foot      Plan:  Patient was evaluated and treated and all questions answered.  -X-rays reviewed with patient.  Her edema is improving.  Appears to have good secondary bone healing beginning to coalesce the MPJ Charcot -New total contact cast was applied today. Continue WBAT in cast boot -We will plan to leave this cast on for 2 weeks.  If it loosened significantly or she has any increased pain or issues she should call to make an ASAP appointment for cast change with myself or another provider.  Return in about 2 weeks (around 12/18/2020).

## 2020-12-10 ENCOUNTER — Encounter: Payer: Self-pay | Admitting: Podiatry

## 2020-12-18 ENCOUNTER — Ambulatory Visit (INDEPENDENT_AMBULATORY_CARE_PROVIDER_SITE_OTHER): Payer: BC Managed Care – PPO

## 2020-12-18 ENCOUNTER — Encounter: Payer: Self-pay | Admitting: Podiatry

## 2020-12-18 ENCOUNTER — Ambulatory Visit: Payer: BC Managed Care – PPO | Admitting: Podiatry

## 2020-12-18 ENCOUNTER — Other Ambulatory Visit: Payer: Self-pay

## 2020-12-18 DIAGNOSIS — S92342D Displaced fracture of fourth metatarsal bone, left foot, subsequent encounter for fracture with routine healing: Secondary | ICD-10-CM | POA: Diagnosis not present

## 2020-12-18 DIAGNOSIS — M14672 Charcot's joint, left ankle and foot: Secondary | ICD-10-CM

## 2020-12-18 DIAGNOSIS — R600 Localized edema: Secondary | ICD-10-CM | POA: Diagnosis not present

## 2020-12-18 DIAGNOSIS — E1142 Type 2 diabetes mellitus with diabetic polyneuropathy: Secondary | ICD-10-CM | POA: Diagnosis not present

## 2020-12-19 ENCOUNTER — Encounter: Payer: Self-pay | Admitting: Podiatry

## 2020-12-19 ENCOUNTER — Ambulatory Visit: Payer: BC Managed Care – PPO | Admitting: Podiatry

## 2020-12-19 NOTE — Progress Notes (Signed)
  Subjective:  Patient ID: Courtney Murphy, female    DOB: Oct 08, 1956,  MRN: [id number]  Chief Complaint  Patient presents with  . Foot Pain    Follow up charcot left    "it feels pretty good.  I can almost pull my foot out of the cast.  The swelling has really gone down    65 y.o. female returns with the above complaint. History confirmed with patient.  She is feeling a little bit better.  Pain is decreased as well as swelling.  The cast is almost quite loose now.  Objective:  Physical Exam: warm, good capillary refill, no trophic changes or ulcerative lesions, normal DP and PT pulses and had abnormal monofilament exam. Left Foot: Edema and pain has decreased significantly.  Still with pain on the fourth metatarsal neck.  Blistering of the proximal nail fold of the second toe is recurred  Radiographs: X-ray of the left foot: Beginning to see consolidation with visible bone callus throughout the fourth metatarsal and metatarsal head, no changes in the third metatarsal head Assessment:   1. Charcot's joint of left foot   2. Closed displaced fracture of fourth metatarsal bone of left foot with routine healing, subsequent encounter   3. Type 2 diabetes mellitus with diabetic polyneuropathy, without long-term current use of insulin ([place])   4. Edema of left foot      Plan:  Patient was evaluated and treated and all questions answered.  -She is doing quite well.  I think we can transition from the total contact casting back to the CAM boot and allow this to resolve.  I applied Betadine paint to the blistering she was having on the second toe and she can do this till it heals.  No signs of infection or deep wound.  Compression dressing was also applied for edema control.  Advised to limit activity and weightbearing as much as she possibly can.  No follow-ups on file.

## 2020-12-20 ENCOUNTER — Inpatient Hospital Stay: Payer: BC Managed Care – PPO

## 2020-12-23 ENCOUNTER — Inpatient Hospital Stay: Payer: BC Managed Care – PPO | Attending: Internal Medicine

## 2020-12-23 DIAGNOSIS — E119 Type 2 diabetes mellitus without complications: Secondary | ICD-10-CM | POA: Diagnosis not present

## 2020-12-23 DIAGNOSIS — Z86711 Personal history of pulmonary embolism: Secondary | ICD-10-CM | POA: Diagnosis present

## 2020-12-23 DIAGNOSIS — K909 Intestinal malabsorption, unspecified: Secondary | ICD-10-CM

## 2020-12-23 DIAGNOSIS — I10 Essential (primary) hypertension: Secondary | ICD-10-CM | POA: Insufficient documentation

## 2020-12-23 DIAGNOSIS — M2550 Pain in unspecified joint: Secondary | ICD-10-CM | POA: Diagnosis not present

## 2020-12-23 DIAGNOSIS — Z9884 Bariatric surgery status: Secondary | ICD-10-CM | POA: Insufficient documentation

## 2020-12-23 DIAGNOSIS — Z90710 Acquired absence of both cervix and uterus: Secondary | ICD-10-CM | POA: Diagnosis not present

## 2020-12-23 DIAGNOSIS — E039 Hypothyroidism, unspecified: Secondary | ICD-10-CM | POA: Diagnosis not present

## 2020-12-23 DIAGNOSIS — Z7901 Long term (current) use of anticoagulants: Secondary | ICD-10-CM | POA: Diagnosis not present

## 2020-12-23 DIAGNOSIS — R233 Spontaneous ecchymoses: Secondary | ICD-10-CM | POA: Diagnosis not present

## 2020-12-23 DIAGNOSIS — E611 Iron deficiency: Secondary | ICD-10-CM | POA: Insufficient documentation

## 2020-12-23 DIAGNOSIS — D6861 Antiphospholipid syndrome: Secondary | ICD-10-CM | POA: Diagnosis not present

## 2020-12-23 DIAGNOSIS — R760 Raised antibody titer: Secondary | ICD-10-CM

## 2020-12-23 DIAGNOSIS — Z87891 Personal history of nicotine dependence: Secondary | ICD-10-CM | POA: Diagnosis not present

## 2020-12-23 DIAGNOSIS — D649 Anemia, unspecified: Secondary | ICD-10-CM | POA: Diagnosis present

## 2020-12-23 DIAGNOSIS — R238 Other skin changes: Secondary | ICD-10-CM

## 2020-12-23 LAB — CBC WITH DIFFERENTIAL/PLATELET
Abs Immature Granulocytes: 0.04 10*3/uL (ref 0.00–0.07)
Basophils Absolute: 0.1 10*3/uL (ref 0.0–0.1)
Basophils Relative: 1 %
Eosinophils Absolute: 0.1 10*3/uL (ref 0.0–0.5)
Eosinophils Relative: 2 %
HCT: 35 % — ABNORMAL LOW (ref 36.0–46.0)
Hemoglobin: 11.9 g/dL — ABNORMAL LOW (ref 12.0–15.0)
Immature Granulocytes: 1 %
Lymphocytes Relative: 29 %
Lymphs Abs: 2.4 10*3/uL (ref 0.7–4.0)
MCH: 30.1 pg (ref 26.0–34.0)
MCHC: 34 g/dL (ref 30.0–36.0)
MCV: 88.6 fL (ref 80.0–100.0)
Monocytes Absolute: 0.4 10*3/uL (ref 0.1–1.0)
Monocytes Relative: 4 %
Neutro Abs: 5.2 10*3/uL (ref 1.7–7.7)
Neutrophils Relative %: 63 %
Platelets: 314 10*3/uL (ref 150–400)
RBC: 3.95 MIL/uL (ref 3.87–5.11)
RDW: 13.4 % (ref 11.5–15.5)
WBC: 8.1 10*3/uL (ref 4.0–10.5)
nRBC: 0 % (ref 0.0–0.2)

## 2020-12-23 LAB — COMPREHENSIVE METABOLIC PANEL
ALT: 20 U/L (ref 0–44)
AST: 16 U/L (ref 15–41)
Albumin: 3.8 g/dL (ref 3.5–5.0)
Alkaline Phosphatase: 107 U/L (ref 38–126)
Anion gap: 11 (ref 5–15)
BUN: 15 mg/dL (ref 8–23)
CO2: 26 mmol/L (ref 22–32)
Calcium: 8.9 mg/dL (ref 8.9–10.3)
Chloride: 100 mmol/L (ref 98–111)
Creatinine, Ser: 0.76 mg/dL (ref 0.44–1.00)
GFR, Estimated: >60 mL/min (ref >60)
Glucose, Bld: 207 mg/dL — ABNORMAL HIGH (ref 70–99)
Potassium: 4.2 mmol/L (ref 3.5–5.1)
Sodium: 137 mmol/L (ref 135–145)
Total Bilirubin: 0.2 mg/dL — ABNORMAL LOW (ref 0.3–1.2)
Total Protein: 6.9 g/dL (ref 6.5–8.1)

## 2020-12-23 LAB — IRON AND TIBC
Iron: 72 ug/dL (ref 28–170)
Saturation Ratios: 14 % (ref 10.4–31.8)
TIBC: 512 ug/dL — ABNORMAL HIGH (ref 250–450)
UIBC: 440 ug/dL

## 2020-12-23 LAB — LACTATE DEHYDROGENASE: LDH: 136 U/L (ref 98–192)

## 2020-12-23 LAB — PROTIME-INR
INR: 0.9 (ref 0.8–1.2)
Prothrombin Time: 12.1 seconds (ref 11.4–15.2)

## 2020-12-23 LAB — PLATELET FUNCTION ASSAY: Collagen / Epinephrine: 103 seconds (ref 0–193)

## 2020-12-23 LAB — APTT: aPTT: <24 seconds — ABNORMAL LOW (ref 24–36)

## 2020-12-23 LAB — VITAMIN D 25 HYDROXY (VIT D DEFICIENCY, FRACTURES): Vit D, 25-Hydroxy: 27.16 ng/mL — ABNORMAL LOW (ref 30–100)

## 2020-12-23 LAB — FERRITIN: Ferritin: 8 ng/mL — ABNORMAL LOW (ref 11–307)

## 2020-12-25 ENCOUNTER — Ambulatory Visit: Payer: BC Managed Care – PPO | Admitting: Podiatry

## 2020-12-25 LAB — ANTIPHOSPHOLIPID SYNDROME PROF
Anticardiolipin IgG: <9 GPL U/mL (ref 0–14)
Anticardiolipin IgM: <9 MPL U/mL (ref 0–12)
DRVVT: 28.5 s (ref 0.0–47.0)
PTT Lupus Anticoagulant: 26.1 s (ref 0.0–51.9)

## 2020-12-25 LAB — BETA-2-GLYCOPROTEIN I ABS, IGG/M/A
Beta-2 Glyco I IgG: 55 GPI IgG units — ABNORMAL HIGH (ref 0–20)
Beta-2-Glycoprotein I IgA: <9 GPI IgA units (ref 0–25)
Beta-2-Glycoprotein I IgM: <9 GPI IgM units (ref 0–32)

## 2021-01-01 ENCOUNTER — Ambulatory Visit (INDEPENDENT_AMBULATORY_CARE_PROVIDER_SITE_OTHER): Payer: BC Managed Care – PPO | Admitting: Podiatry

## 2021-01-01 ENCOUNTER — Other Ambulatory Visit: Payer: Self-pay

## 2021-01-01 ENCOUNTER — Ambulatory Visit (INDEPENDENT_AMBULATORY_CARE_PROVIDER_SITE_OTHER): Payer: BC Managed Care – PPO

## 2021-01-01 ENCOUNTER — Encounter: Payer: Self-pay | Admitting: Podiatry

## 2021-01-01 DIAGNOSIS — E1142 Type 2 diabetes mellitus with diabetic polyneuropathy: Secondary | ICD-10-CM | POA: Diagnosis not present

## 2021-01-01 DIAGNOSIS — M14672 Charcot's joint, left ankle and foot: Secondary | ICD-10-CM

## 2021-01-01 DIAGNOSIS — S92342D Displaced fracture of fourth metatarsal bone, left foot, subsequent encounter for fracture with routine healing: Secondary | ICD-10-CM | POA: Diagnosis not present

## 2021-01-01 DIAGNOSIS — R600 Localized edema: Secondary | ICD-10-CM | POA: Diagnosis not present

## 2021-01-01 NOTE — Patient Instructions (Signed)
Bring your sneakers and your orthotic shoe inserts with you to your next appointment

## 2021-01-01 NOTE — Progress Notes (Signed)
  Subjective:  Patient ID: Courtney Murphy, female    DOB: Oct 01, 1956,  MRN: [id number]  Chief Complaint  Patient presents with  . Fracture    "its doing better, but swollen today.  Now Im starting to have the neuroma pains again    65 y.o. female returns with the above complaint. History confirmed with patient.  Having some shooting pains at the tops of the toes.  None on the bottom.  She thinks the nail plate on the second toe is coming off.  Objective:  Physical Exam: warm, good capillary refill, no trophic changes or ulcerative lesions, normal DP and PT pulses and had abnormal monofilament exam. Left Foot: Continue improved with reduction in edema and pain today.  Still painful over the fourth and third metatarsal heads.  Nail plate of the second toe appears to be lifting off, no signs of paronychia  Radiographs: X-ray of the left foot: Good consolidation and reduction callus of fracture of fourth metatarsal head, third metatarsal appears healed, no increasing fragmentation. Assessment:   1. Charcot's joint of left foot   2. Closed displaced fracture of fourth metatarsal bone of left foot with routine healing, subsequent encounter   3. Type 2 diabetes mellitus with diabetic polyneuropathy, without long-term current use of insulin ([place])   4. Edema of left foot      Plan:  Patient was evaluated and treated and all questions answered.  -She is doing quite well.  Hopefully work towards the end of the healing for this short-term flare.  We discussed long-term support and a supportive shoe and the risk of recurrence -I recommend she continue in the CAM boot for 2 more weeks.  We will take new x-rays at next visit -If remains consolidated she will transition back to a supportive shoe with the orthotic inserts that she has and she will bring these with her  Return in about 2 weeks (around 01/15/2021) for fracture follow up and new xrays.

## 2021-01-03 ENCOUNTER — Inpatient Hospital Stay: Payer: BC Managed Care – PPO | Admitting: Internal Medicine

## 2021-01-06 ENCOUNTER — Other Ambulatory Visit: Payer: Self-pay

## 2021-01-06 ENCOUNTER — Encounter: Payer: Self-pay | Admitting: Internal Medicine

## 2021-01-06 ENCOUNTER — Inpatient Hospital Stay: Payer: BC Managed Care – PPO | Attending: Internal Medicine | Admitting: Internal Medicine

## 2021-01-06 DIAGNOSIS — Z86718 Personal history of other venous thrombosis and embolism: Secondary | ICD-10-CM | POA: Diagnosis present

## 2021-01-06 DIAGNOSIS — Z9884 Bariatric surgery status: Secondary | ICD-10-CM | POA: Diagnosis not present

## 2021-01-06 DIAGNOSIS — R760 Raised antibody titer: Secondary | ICD-10-CM | POA: Insufficient documentation

## 2021-01-06 DIAGNOSIS — Z86711 Personal history of pulmonary embolism: Secondary | ICD-10-CM | POA: Diagnosis present

## 2021-01-06 DIAGNOSIS — K909 Intestinal malabsorption, unspecified: Secondary | ICD-10-CM | POA: Diagnosis not present

## 2021-01-06 DIAGNOSIS — D508 Other iron deficiency anemias: Secondary | ICD-10-CM | POA: Diagnosis present

## 2021-01-06 NOTE — Progress Notes (Signed)
[facility] NOTE  Patient Care Team: Audelia Acton, MD as PCP - General (Internal Medicine)  CHIEF COMPLAINTS/PURPOSE OF CONSULTATION: DVT/PE  # PE [> 10-15 years] x 6 months of coumadin [3-4 months prior TAH]; No provoking causes; No Family Hx of blood clots; NO BCPs; no miscarriages;OCT 2021 [incidental rheumatologic work-up]-lupus anticoagulant-NEG; Beta2 IgG- 56 [H]; anticardiolipin-IgM -indeterminate [>13]; REPEAT APS WORK-UP JAN 2022-anticardiolipin IgG 55; rest of the work-up unremarkable  # Gastric By pass [lost 80 pounds; 2011]; JAN 2022-iron deficiency noted; IV Venofer  # Joint pains [Dr.Patel; [facility]] Oncology History  Antiphospholipid antibody positive     HISTORY OF PRESENTING ILLNESS:  Courtney Murphy 65 y.o.  female remote history of PE more than 10 to 15 years ago-with abnormal anticardiolipin antibody panel noted on screening is here for follow-up.  Patient continues to have muscle aches joint pains.  Otherwise denies any blood clots.  Complains of fatigue.  Review of Systems  Constitutional: Positive for malaise/fatigue. Negative for chills, diaphoresis, fever and weight loss.  HENT: Negative for nosebleeds and sore throat.   Eyes: Negative for double vision.  Respiratory: Negative for cough, hemoptysis, sputum production, shortness of breath and wheezing.   Cardiovascular: Negative for chest pain, palpitations, orthopnea and leg swelling.  Gastrointestinal: Negative for abdominal pain, blood in stool, constipation, diarrhea, heartburn, melena, nausea and vomiting.  Genitourinary: Negative for dysuria, frequency and urgency.  Musculoskeletal: Positive for back pain and joint pain.  Skin: Negative.  Negative for itching and rash.  Neurological: Negative for dizziness, tingling, focal weakness, weakness and headaches.  Endo/Heme/Allergies: Bruises/bleeds easily.  Psychiatric/Behavioral: Negative for depression. The patient is not  nervous/anxious and does not have insomnia.      MEDICAL HISTORY:  Past Medical History:  Diagnosis Date  . Acid reflux 10/11/2015  . Anemia    RECEIVES IRON INFUSIONS  . Arthritis   . Asthma    WELL CONTROLLED  . Depression   . Diabetes mellitus without complication ([place])   . Fatty liver   . Gout   . History of kidney stones   . History of methicillin resistant staphylococcus aureus (MRSA) 2016  . Hypertension    H/O BEEN OFF BP MEDS FOR 1 YEAR  . Hypothyroidism   . Kidney stone   . Knee pain    Left  . Pulmonary emboli ([place]) 2011  . Restless leg syndrome   . Shoulder pain     SURGICAL HISTORY: Past Surgical History:  Procedure Laterality Date  . ABDOMINAL HYSTERECTOMY    . BACK SURGERY    . BREAST REDUCTION SURGERY    . CATARACT EXTRACTION W/PHACO Left 07/28/2017   Procedure: CATARACT EXTRACTION PHACO AND INTRAOCULAR LENS PLACEMENT ([place]) diabetic Left;  Surgeon: Leandrew Koyanagi, MD;  Location: [facility];  Service: Ophthalmology;  Laterality: Left;  Diabetic  . CESAREAN SECTION  1990  . CHOLECYSTECTOMY    . CYSTOSCOPY/URETEROSCOPY/HOLMIUM LASER/STENT PLACEMENT Left 09/08/2017   Procedure: CYSTOSCOPY/URETEROSCOPY/HOLMIUM LASER/STENT EXCHANGE;  Surgeon: Hollice Espy, MD;  Location: [facility];  Service: Urology;  Laterality: Left;  . CYSTOSCOPY/URETEROSCOPY/HOLMIUM LASER/STENT PLACEMENT Left 12/14/2018   Procedure: CYSTOSCOPY/URETEROSCOPY/HOLMIUM LASER/STENT PLACEMENT;  Surgeon: Hollice Espy, MD;  Location: [facility];  Service: Urology;  Laterality: Left;  . FOOT SURGERY    . GASTRIC BYPASS    . IR NEPHROSTOMY PLACEMENT LEFT  08/17/2017  . KNEE ARTHROSCOPY Left   . KNEE ARTHROSCOPY Left 07/19/2015   Procedure: ARTHROSCOPY KNEE;  Surgeon: Leanor Kail, MD;  Location: [facility];  Service: Orthopedics;  Laterality: Left;  . NEPHROLITHOTOMY Left 08/17/2017   Procedure: NEPHROLITHOTOMY PERCUTANEOUS WITH HOLMIUM LASER;  Surgeon: Hollice Espy, MD;   Location: [facility];  Service: Urology;  Laterality: Left;  . THUMB ARTHROSCOPY    . TONSILLECTOMY      SOCIAL HISTORY: Social History   Socioeconomic History  . Marital status: Widowed    Spouse name: Not on file  . Number of children: Not on file  . Years of education: Not on file  . Highest education level: Not on file  Occupational History  . Not on file  Tobacco Use  . Smoking status: Former Smoker    Packs/day: 0.50    Years: 4.00    Pack years: 2.00    Types: Cigarettes    Quit date: 04/14/2015    Years since quitting: 5.7  . Smokeless tobacco: Never Used  Vaping Use  . Vaping Use: Never used  Substance and Sexual Activity  . Alcohol use: Not Currently    Comment: RARE  . Drug use: No  . Sexual activity: Never  Other Topics Concern  . Not on file  Social History Narrative   Quit smoking > 30 years ago; rare alcohol. Used to worked at [employer]; receptionist at [employer]. Lives in [place]; with dog.    Social Determinants of Health   Financial Resource Strain: Not on file  Food Insecurity: Not on file  Transportation Needs: Not on file  Physical Activity: Not on file  Stress: Not on file  Social Connections: Not on file  Intimate Partner Violence: Not on file    FAMILY HISTORY: Family History  Problem Relation Age of Onset  . Cancer Mother        breast  . Heart disease Father   . Diabetes Father   . Hypertension Father   . Cancer Maternal Grandmother   . Cancer Maternal Grandfather   . Heart disease Paternal Grandmother   . Heart disease Paternal Grandfather   . Cancer Maternal Aunt   . Cancer Maternal Uncle   . Heart disease Paternal Aunt   . Heart disease Paternal Uncle   . Kidney cancer Neg Hx   . Bladder Cancer Neg Hx     ALLERGIES:  is allergic to sulfasalazine, nsaids, sulfa antibiotics, and penicillins.  MEDICATIONS:  Current Outpatient Medications  Medication Sig Dispense Refill  . atomoxetine (STRATTERA) 40  MG capsule     . atorvastatin (LIPITOR) 20 MG tablet Take 20 mg by mouth every morning.     . cyanocobalamin (,VITAMIN B-12,) 1000 MCG/ML injection Inject 1,000 mcg into the muscle once a week.    . cyclobenzaprine (FLEXERIL) 10 MG tablet Take 10 mg by mouth every 8 (eight) hours.    . Dulaglutide 0.75 MG/0.5ML SOPN Inject 0.75 mg into the skin every Thursday.     . gabapentin (NEURONTIN) 400 MG capsule Take 400 mg by mouth 4 (four) times daily as needed (neuropathy).     Marland Kitchen HYDROcodone-acetaminophen (NORCO/VICODIN) 5-325 MG tablet Take 1 tablet by mouth 3 (three) times daily as needed.    Marland Kitchen levothyroxine (SYNTHROID, LEVOTHROID) 100 MCG tablet Take 100 mcg by mouth daily before breakfast.    . lisinopril (ZESTRIL) 20 MG tablet Take 20 mg by mouth daily.    . pramipexole (MIRAPEX) 0.5 MG tablet Take 0.5-1 mg by mouth at bedtime.      No current facility-administered medications for this visit.      Marland Kitchen  PHYSICAL EXAMINATION:  Vitals:   01/06/21 1500  BP: 121/80  Pulse: (!) 108  Resp: 20  Temp: (!) 96.8 F (36 C)   Filed Weights   01/06/21 1500  Weight: 183 lb (83 kg)    Physical Exam HENT:     Head: Normocephalic and atraumatic.     Mouth/Throat:     Pharynx: No oropharyngeal exudate.  Eyes:     Pupils: Pupils are equal, round, and reactive to light.  Cardiovascular:     Rate and Rhythm: Normal rate and regular rhythm.  Pulmonary:     Effort: Pulmonary effort is normal. No respiratory distress.     Breath sounds: Normal breath sounds. No wheezing.  Abdominal:     General: Bowel sounds are normal. There is no distension.     Palpations: Abdomen is soft. There is no mass.     Tenderness: There is no abdominal tenderness. There is no guarding or rebound.  Musculoskeletal:        General: No tenderness. Normal range of motion.     Cervical back: Normal range of motion and neck supple.  Skin:    General: Skin is warm.     Comments: Chronic bruising noted on the upper  extremities.  No bruises on the trunk or lower extremities.  Neurological:     Mental Status: She is alert and oriented to person, place, and time.  Psychiatric:        Mood and Affect: Affect normal.      LABORATORY DATA:  I have reviewed the data as listed Lab Results  Component Value Date   WBC 8.1 12/23/2020   HGB 11.9 (L) 12/23/2020   HCT 35.0 (L) 12/23/2020   MCV 88.6 12/23/2020   PLT 314 12/23/2020   Recent Labs    05/26/20 1545 07/17/20 1242 12/23/20 1107  NA 138 143 137  K 4.0 4.9 4.2  CL 101 103 100  CO2 24 26 26   GLUCOSE 265* 145* 207*  BUN 17 10 15   CREATININE 0.94 0.84 0.76  CALCIUM 8.8* 9.5 8.9  GFRNONAA >60 74 >60  GFRAA >60 85  --   PROT  --  6.6 6.9  ALBUMIN  --  4.4 3.8  AST  --  16 16  ALT  --  20 20  ALKPHOS  --  168* 107  BILITOT  --  0.3 0.2*    RADIOGRAPHIC STUDIES: I have personally reviewed the radiological images as listed and agreed with the findings in the report. DG Foot Complete Left  Result Date: 01/01/2021 Please see detailed radiograph report in office note.  DG Foot Complete Left  Result Date: 12/18/2020 Please see detailed radiograph report in office note.   ASSESSMENT & PLAN:   Antiphospholipid antibody positive #-anticardiolipin IgG [mildly positive]; beta-2 glycoprotein IgM positive [Incidental; rheumatology].  Prior history of PE 10-15 years ago; currently on anticoagulation.  No history of miscarriages.; repeat APS work up-elevated IgG anticardiolipin antibody.  Repeat phospholipid antibody panel otherwise unremarkable including lupus anticoagulant negative.  Recommend monitoring off anticoagulation.   #Easy bruising-only on the upper extremities/with minimal trauma-PT PTT normal.  Monitor for now.  #Iron deficiency anemia [ gastric bypass-]-low iron saturation/ferritin.  Recommend IV Venofer.Discussed the potential acute infusion reactions with IV iron; which are quite rare.  Patient understands the risk; will proceed  with infusions.  Recommend B12 injections./Form  # DISPOSITION: # Venofer weekly x3-start next week # follow up in 4 months-MD; labs- cbc/bmp possible venofer-Dr.B   All questions were  answered. The patient knows to call the clinic with any problems, questions or concerns.   Cammie Sickle, MD 01/08/2021 2:50 PM

## 2021-01-06 NOTE — Assessment & Plan Note (Addendum)
#-  anticardiolipin IgG [mildly positive]; beta-2 glycoprotein IgM positive [Incidental; rheumatology].  Prior history of PE 10-15 years ago; currently on anticoagulation.  No history of miscarriages.; repeat APS work up-elevated IgG anticardiolipin antibody.  Repeat phospholipid antibody panel otherwise unremarkable including lupus anticoagulant negative.  Recommend monitoring off anticoagulation.   #Easy bruising-only on the upper extremities/with minimal trauma-PT PTT normal.  Monitor for now.  #Iron deficiency anemia [ gastric bypass-]-low iron saturation/ferritin.  Recommend IV Venofer.Discussed the potential acute infusion reactions with IV iron; which are quite rare.  Patient understands the risk; will proceed with infusions.  Recommend B12 injections./Form  # DISPOSITION: # Venofer weekly x3-start next week # follow up in 4 months-MD; labs- cbc/bmp possible venofer-Dr.B

## 2021-01-08 ENCOUNTER — Encounter: Payer: Self-pay | Admitting: Podiatry

## 2021-01-12 LAB — VITAMIN K1, SERUM: VITAMIN K1: 0.94 ng/mL (ref 0.10–2.20)

## 2021-01-14 ENCOUNTER — Encounter: Payer: Self-pay | Admitting: Urology

## 2021-01-14 ENCOUNTER — Ambulatory Visit (INDEPENDENT_AMBULATORY_CARE_PROVIDER_SITE_OTHER): Payer: BC Managed Care – PPO | Admitting: Urology

## 2021-01-14 ENCOUNTER — Other Ambulatory Visit: Payer: Self-pay

## 2021-01-14 ENCOUNTER — Ambulatory Visit
Admission: RE | Admit: 2021-01-14 | Discharge: 2021-01-14 | Disposition: A | Discharge status: Home / Self Care | Payer: BC Managed Care – PPO | Source: Ambulatory Visit | Attending: Urology | Admitting: Urology

## 2021-01-14 ENCOUNTER — Ambulatory Visit
Admission: RE | Admit: 2021-01-14 | Discharge: 2021-01-14 | Disposition: A | Discharge status: Home / Self Care | Payer: BC Managed Care – PPO | Attending: Urology | Admitting: Urology

## 2021-01-14 VITALS — BP 136/83 | HR 102 | Ht 64.0 in

## 2021-01-14 DIAGNOSIS — R8281 Pyuria: Secondary | ICD-10-CM | POA: Diagnosis not present

## 2021-01-14 DIAGNOSIS — N200 Calculus of kidney: Secondary | ICD-10-CM

## 2021-01-14 LAB — URINALYSIS, COMPLETE
Bilirubin, UA: NEGATIVE
Nitrite, UA: NEGATIVE
Protein,UA: NEGATIVE
Specific Gravity, UA: 1.02 (ref 1.005–1.030)
Urobilinogen, Ur: 0.2 mg/dL (ref 0.2–1.0)
pH, UA: 5 (ref 5.0–7.5)

## 2021-01-14 LAB — MICROSCOPIC EXAMINATION: WBC, UA: >30 /hpf — AB (ref 0–5)

## 2021-01-14 NOTE — Progress Notes (Signed)
01/14/2021 10:10 AM   Courtney Murphy [birth year] [id number]  Referring provider: Audelia Acton, MD No address on file  Chief Complaint  Patient presents with  . Nephrolithiasis    HPI: 65 year old female with a personal history of nephrolithiasis returns today for routine annual follow-up with KUB.  Since last visit, she had no stone episodes.  She denies any gross hematuria or dysuria.  She did assess her urine today because she states some bubbles/foam in her urine and whether this can be a sign of infection.  She does report that she has received a lot of steroid injections and her blood sugars been a little bit out of control.  No cross-sectional imaging over the past year.  KUB today is somewhat distorted secondary to significant bowel gas.  On her KUB last year, she did have small stones in her left kidney up to 5 mm.  She did have a metabolic work-up in 8185 which showed hypercalciuria and low volume.  She is been managed with dietary modification.  She does have an extensive history of kidney stones and requiring PCNL multiple ureteroscopy, most recently in 11/2018.  She is worked hard on trying to increase her volume.  She notes a water bottle that she marks off with a goal fluid intake hourly.   PMH: Past Medical History:  Diagnosis Date  . Acid reflux 10/11/2015  . Anemia    RECEIVES IRON INFUSIONS  . Arthritis   . Asthma    WELL CONTROLLED  . Depression   . Diabetes mellitus without complication ([place])   . Fatty liver   . Gout   . History of kidney stones   . History of methicillin resistant staphylococcus aureus (MRSA) 2016  . Hypertension    H/O BEEN OFF BP MEDS FOR 1 YEAR  . Hypothyroidism   . Kidney stone   . Knee pain    Left  . Pulmonary emboli ([place]) 2011  . Restless leg syndrome   . Shoulder pain     Surgical History: Past Surgical History:  Procedure Laterality Date  . ABDOMINAL HYSTERECTOMY    . BACK SURGERY    . BREAST  REDUCTION SURGERY    . CATARACT EXTRACTION W/PHACO Left 07/28/2017   Procedure: CATARACT EXTRACTION PHACO AND INTRAOCULAR LENS PLACEMENT ([place]) diabetic Left;  Surgeon: Leandrew Koyanagi, MD;  Location: [facility];  Service: Ophthalmology;  Laterality: Left;  Diabetic  . CESAREAN SECTION  1990  . CHOLECYSTECTOMY    . CYSTOSCOPY/URETEROSCOPY/HOLMIUM LASER/STENT PLACEMENT Left 09/08/2017   Procedure: CYSTOSCOPY/URETEROSCOPY/HOLMIUM LASER/STENT EXCHANGE;  Surgeon: Hollice Espy, MD;  Location: [facility];  Service: Urology;  Laterality: Left;  . CYSTOSCOPY/URETEROSCOPY/HOLMIUM LASER/STENT PLACEMENT Left 12/14/2018   Procedure: CYSTOSCOPY/URETEROSCOPY/HOLMIUM LASER/STENT PLACEMENT;  Surgeon: Hollice Espy, MD;  Location: [facility];  Service: Urology;  Laterality: Left;  . FOOT SURGERY    . GASTRIC BYPASS    . IR NEPHROSTOMY PLACEMENT LEFT  08/17/2017  . KNEE ARTHROSCOPY Left   . KNEE ARTHROSCOPY Left 07/19/2015   Procedure: ARTHROSCOPY KNEE;  Surgeon: Leanor Kail, MD;  Location: [facility];  Service: Orthopedics;  Laterality: Left;  . NEPHROLITHOTOMY Left 08/17/2017   Procedure: NEPHROLITHOTOMY PERCUTANEOUS WITH HOLMIUM LASER;  Surgeon: Hollice Espy, MD;  Location: [facility];  Service: Urology;  Laterality: Left;  . THUMB ARTHROSCOPY    . TONSILLECTOMY      Home Medications:  Allergies as of 01/14/2021      Reactions   Sulfasalazine Hives   Nsaids    Avoids because  of gastric bypass   Sulfa Antibiotics Hives   Penicillins Hives, Rash   Has patient had a PCN reaction causing immediate rash, facial/tongue/throat swelling, SOB or lightheadedness with hypotension: No Has patient had a PCN reaction causing severe rash involving mucus membranes or skin necrosis: No Has patient had a PCN reaction that required hospitalization: No Has patient had a PCN reaction occurring within the last 10 years: No If all of the above answers are "NO", then may proceed with Cephalosporin use.       Medication List       Accurate as of January 14, 2021 10:10 AM. If you have any questions, ask your nurse or doctor.        atomoxetine 40 MG capsule Commonly known as: STRATTERA   atorvastatin 20 MG tablet Commonly known as: LIPITOR Take 20 mg by mouth every morning.   cyanocobalamin 1000 MCG/ML injection Commonly known as: (VITAMIN B-12) Inject 1,000 mcg into the muscle once a week.   cyclobenzaprine 10 MG tablet Commonly known as: FLEXERIL Take 10 mg by mouth every 8 (eight) hours.   Dulaglutide 0.75 MG/0.5ML Sopn Inject 0.75 mg into the skin every Thursday.   gabapentin 400 MG capsule Commonly known as: NEURONTIN Take 400 mg by mouth 4 (four) times daily as needed (neuropathy).   gabapentin 300 MG capsule Commonly known as: NEURONTIN Take 300 mg by mouth at bedtime.   HYDROcodone-acetaminophen 5-325 MG tablet Commonly known as: NORCO/VICODIN Take 1 tablet by mouth 3 (three) times daily as needed.   ketorolac 10 MG tablet Commonly known as: TORADOL ketorolac 10 mg tablet  Take 1 tablet every 8 hours by oral route with meals.  Start on 01/07/21   levothyroxine 100 MCG tablet Commonly known as: SYNTHROID Take 100 mcg by mouth daily before breakfast.   lisinopril 20 MG tablet Commonly known as: ZESTRIL Take 20 mg by mouth daily.   metFORMIN 500 MG 24 hr tablet Commonly known as: GLUCOPHAGE-XR Take 500 mg by mouth daily.   pramipexole 0.5 MG tablet Commonly known as: MIRAPEX Take 0.5-1 mg by mouth at bedtime.       Allergies:  Allergies  Allergen Reactions  . Sulfasalazine Hives  . 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 Sulfa Antibiotics Hives  . Penicillins Hives and Rash    Has patient had a PCN reaction causing immediate rash, facial/tongue/throat swelling, SOB or lightheadedness with hypotension: No Has patient had a PCN reaction causing severe rash involving mucus membranes or skin necrosis: No Has patient had a PCN reaction that  required hospitalization: No Has patient had a PCN reaction occurring within the last 10 years: No If all of the above answers are "NO", then may proceed with Cephalosporin use.     Family History: Family History  Problem Relation Age of Onset  . Cancer Mother        breast  . Heart disease Father   . Diabetes Father   . Hypertension Father   . Cancer Maternal Grandmother   . Cancer Maternal Grandfather   . Heart disease Paternal Grandmother   . Heart disease Paternal Grandfather   . Cancer Maternal Aunt   . Cancer Maternal Uncle   . Heart disease Paternal Aunt   . Heart disease Paternal Uncle   . Kidney cancer Neg Hx   . Bladder Cancer Neg Hx     Social History:  reports that she quit smoking about 5 years ago. Her smoking use  included cigarettes. She has a 2.00 pack-year smoking history. She has never used smokeless tobacco. She reports previous alcohol use. She reports that she does not use drugs.   Physical Exam: BP 136/83   Pulse (!) 102   Ht 5\' 4"  (1.626 m)   BMI 31.41 kg/m   Constitutional:  Alert and oriented, No acute distress.  Wearing a boot. HEENT: [place] AT, moist mucus membranes.  Trachea midline, no masses. Cardiovascular: No clubbing, cyanosis, or edema. Respiratory: Normal respiratory effort, no increased work of breathing. Skin: No rashes, bruises or suspicious lesions. Neurologic: Grossly intact, no focal deficits, moving all 4 extremities. Psychiatric: Normal mood and affect.  Laboratory Data: Lab Results  Component Value Date   WBC 8.1 12/23/2020   HGB 11.9 (L) 12/23/2020   HCT 35.0 (L) 12/23/2020   MCV 88.6 12/23/2020   PLT 314 12/23/2020    Lab Results  Component Value Date   CREATININE 0.76 12/23/2020    Lab Results  Component Value Date   HGBA1C 8.8 (H) 08/11/2019    Urinalysis UA today with greater than 30 white blood cells, no red blood cells, 2+ glucose, negative protein, few bacteria, nitrate negative  Pertinent Imaging: KUB  that was done today was personally reviewed.  Radiologic interpretation is pending but this was fairly unremarkable without obvious stone burden but significantly distorted by bowel gas with poor visualization of the kidneys.   Assessment & Plan:    1. Left nephrolithiasis Nondiagnostic imaging today on KUB secondary to bowel gas  Given that she is asymptomatic, will defer further imaging at this time including renal ultrasound and CT and that she develops significant symptoms.  Plan for KUB again next year, return sooner as needed if she develops - Urinalysis, Complete  2. Recurrent nephrolithiasis Reviewed stone diet recommendations, previously had very low urinary volumes and elevated calcium.  Reviewed calcium recommendations along with increasing fluids.  She will also add lemon and lime to her diet.  3. Pyuria Incidental pyuria today on urinalysis along with 2+ glucose.  Of asked her to follow-up with her primary care due to concern for poorly controlled blood sugar.  Significance of pyuria unknown, asymptomatic.  Will culture and treat as needed if she develops any symptoms.  Follow-up in 1 year with KUB or sooner as needed   Hollice Espy, MD  [facility] [address] [place], [place] [address] [phone]

## 2021-01-15 ENCOUNTER — Ambulatory Visit: Payer: BC Managed Care – PPO | Admitting: Podiatry

## 2021-01-15 ENCOUNTER — Ambulatory Visit (INDEPENDENT_AMBULATORY_CARE_PROVIDER_SITE_OTHER): Payer: BC Managed Care – PPO

## 2021-01-15 ENCOUNTER — Inpatient Hospital Stay: Payer: BC Managed Care – PPO

## 2021-01-15 DIAGNOSIS — S92335D Nondisplaced fracture of third metatarsal bone, left foot, subsequent encounter for fracture with routine healing: Secondary | ICD-10-CM

## 2021-01-15 DIAGNOSIS — S92342D Displaced fracture of fourth metatarsal bone, left foot, subsequent encounter for fracture with routine healing: Secondary | ICD-10-CM | POA: Diagnosis not present

## 2021-01-15 DIAGNOSIS — M14672 Charcot's joint, left ankle and foot: Secondary | ICD-10-CM

## 2021-01-15 DIAGNOSIS — E1142 Type 2 diabetes mellitus with diabetic polyneuropathy: Secondary | ICD-10-CM | POA: Diagnosis not present

## 2021-01-15 DIAGNOSIS — R600 Localized edema: Secondary | ICD-10-CM | POA: Diagnosis not present

## 2021-01-16 ENCOUNTER — Ambulatory Visit: Payer: BC Managed Care – PPO

## 2021-01-17 ENCOUNTER — Encounter: Payer: Self-pay | Admitting: Podiatry

## 2021-01-17 LAB — CULTURE, URINE COMPREHENSIVE

## 2021-01-19 ENCOUNTER — Encounter: Payer: Self-pay | Admitting: Podiatry

## 2021-01-19 NOTE — Progress Notes (Signed)
  Subjective:  Patient ID: Courtney Murphy, female    DOB: [birth year],  MRN: [id number]  Chief Complaint  Patient presents with  . Fracture    65 y.o. female returns with the above complaint. History confirmed with patient.  She still has some swelling.  The foot continues to improve.  Objective:  Physical Exam: warm, good capillary refill, no trophic changes or ulcerative lesions, normal DP and PT pulses and had abnormal monofilament exam. Left Foot: Minimal pain today, edema improved  Radiographs: X-ray of the left foot: Good consolidation and reduction callus of fracture of fourth metatarsal head is now appears healed, third metatarsal appears healed, no increasing fragmentation. Assessment:   1. Charcot's joint of left foot      Plan:  Patient was evaluated and treated and all questions answered.  -We discussed long-term support and a supportive shoe and the risk of recurrence -May begin to transition out of CAM boot over the next 2 weeks into a supportive shoe with the orthotic inserts that she has I inspected these and they fit well  Return in about 2 weeks (around 01/29/2021).

## 2021-01-21 ENCOUNTER — Encounter: Payer: Self-pay | Admitting: Podiatry

## 2021-01-22 ENCOUNTER — Inpatient Hospital Stay: Payer: BC Managed Care – PPO

## 2021-01-22 ENCOUNTER — Other Ambulatory Visit: Payer: Self-pay

## 2021-01-22 VITALS — BP 119/74 | HR 88 | Temp 98.7°F

## 2021-01-22 DIAGNOSIS — K909 Intestinal malabsorption, unspecified: Secondary | ICD-10-CM

## 2021-01-22 DIAGNOSIS — D508 Other iron deficiency anemias: Secondary | ICD-10-CM | POA: Diagnosis not present

## 2021-01-22 MED ORDER — SODIUM CHLORIDE 0.9 % IV SOLN
Freq: Once | INTRAVENOUS | Status: AC
  Filled 2021-01-22: qty 250

## 2021-01-22 MED ORDER — IRON SUCROSE 20 MG/ML IV SOLN
200.0000 mg | Freq: Once | INTRAVENOUS | Status: AC
  Administered 2021-01-22: 200 mg via INTRAVENOUS
  Filled 2021-01-22: qty 10

## 2021-01-22 MED ORDER — SODIUM CHLORIDE 0.9 % IV SOLN: 200.0000 mg | Freq: Once | INTRAVENOUS | Status: DC

## 2021-01-23 ENCOUNTER — Ambulatory Visit: Payer: BC Managed Care – PPO

## 2021-01-23 NOTE — Telephone Encounter (Signed)
Patient calling to request that Dr. Sherryle Lis please fill out return to work form by tomorrow. Patient stated she must turn in form to work by 2/59m top return to work by 3/1.

## 2021-01-24 ENCOUNTER — Encounter: Payer: Self-pay | Admitting: Podiatry

## 2021-01-27 ENCOUNTER — Telehealth: Payer: Self-pay | Admitting: Podiatry

## 2021-01-27 NOTE — Telephone Encounter (Signed)
Called pt last Friday, she will stop by today to pick up her note.

## 2021-01-27 NOTE — Telephone Encounter (Signed)
Thank you :)

## 2021-01-27 NOTE — Telephone Encounter (Signed)
Do you mind checking with her and seeing if it's still this swollen? If so she should be back in boot ASAP and stay off of it. May need to come in for an x-ray/visit

## 2021-01-28 NOTE — Telephone Encounter (Signed)
I spoke with patient and instructed her to return to wearing the boot and elevating as much as she could per Dr. Maxie Barb orders.  She was unhappy with going back into boot, but said "ok I hate that boot, but I'll put it back on"  She has follow up appt with him tomorrow.

## 2021-01-29 ENCOUNTER — Telehealth: Payer: Self-pay | Admitting: *Deleted

## 2021-01-29 ENCOUNTER — Ambulatory Visit (INDEPENDENT_AMBULATORY_CARE_PROVIDER_SITE_OTHER): Payer: BC Managed Care – PPO | Admitting: Podiatry

## 2021-01-29 ENCOUNTER — Ambulatory Visit (INDEPENDENT_AMBULATORY_CARE_PROVIDER_SITE_OTHER): Payer: BC Managed Care – PPO

## 2021-01-29 ENCOUNTER — Inpatient Hospital Stay: Payer: BC Managed Care – PPO | Attending: Internal Medicine

## 2021-01-29 ENCOUNTER — Encounter: Payer: Self-pay | Admitting: Podiatry

## 2021-01-29 ENCOUNTER — Other Ambulatory Visit: Payer: Self-pay

## 2021-01-29 VITALS — BP 112/55 | HR 97 | Temp 96.7°F | Resp 16

## 2021-01-29 DIAGNOSIS — R600 Localized edema: Secondary | ICD-10-CM

## 2021-01-29 DIAGNOSIS — E1142 Type 2 diabetes mellitus with diabetic polyneuropathy: Secondary | ICD-10-CM

## 2021-01-29 DIAGNOSIS — S92342D Displaced fracture of fourth metatarsal bone, left foot, subsequent encounter for fracture with routine healing: Secondary | ICD-10-CM | POA: Diagnosis not present

## 2021-01-29 DIAGNOSIS — S92335D Nondisplaced fracture of third metatarsal bone, left foot, subsequent encounter for fracture with routine healing: Secondary | ICD-10-CM | POA: Diagnosis not present

## 2021-01-29 DIAGNOSIS — D509 Iron deficiency anemia, unspecified: Secondary | ICD-10-CM | POA: Diagnosis present

## 2021-01-29 DIAGNOSIS — K909 Intestinal malabsorption, unspecified: Secondary | ICD-10-CM

## 2021-01-29 DIAGNOSIS — M14672 Charcot's joint, left ankle and foot: Secondary | ICD-10-CM

## 2021-01-29 MED ORDER — SODIUM CHLORIDE 0.9 % IV SOLN
Freq: Once | INTRAVENOUS | Status: AC
  Filled 2021-01-29: qty 250

## 2021-01-29 MED ORDER — SODIUM CHLORIDE 0.9 % IV SOLN: 200.0000 mg | Freq: Once | INTRAVENOUS | Status: DC

## 2021-01-29 MED ORDER — IRON SUCROSE 20 MG/ML IV SOLN
200.0000 mg | Freq: Once | INTRAVENOUS | Status: AC
  Administered 2021-01-29: 200 mg via INTRAVENOUS
  Filled 2021-01-29: qty 10

## 2021-01-29 NOTE — Telephone Encounter (Signed)
I am calling to let you know I found out what was going on with your form.  Apparently, you spoke to [place] in our [place] office.  She wrote you a note and it says she told you, you can pick it up."  No, I didn't speak to her.  I spoke to the nurse, Levada Dy."  We received a message in MyChart from you on 01/17/2021 that has the form attached to it.  It's only a one page form.  Are there any other pages?  "Yes, it was only one page.  I left a message with Levada Dy on the nurse's voicemail this week."  I don't know where the disconnect is but I apologize.  I will complete the form and fax it to [employer].  However, did you go back to work yesterday?  "No, I didn't.  Dr. Sherryle Lis said I couldn't go back to work yet.  I don't know why they still need me to still send this in even though I can't go back to work."  I will cross out the return to work on 01/28/2021 and mark cannot return to work at this time.  "Thank you, I appreciate it.  Can you email me a copy of the form?"  If I have a scanner I will email it to you.  "If not, I can stop by after my Cancer appointment and pick it up."  I faxed the form to [facility] upon the patient's request.  It was only one page, which I brought to the patient's attention when I spoke with her.

## 2021-01-30 ENCOUNTER — Ambulatory Visit: Payer: BC Managed Care – PPO

## 2021-01-31 DIAGNOSIS — M79676 Pain in unspecified toe(s): Secondary | ICD-10-CM

## 2021-02-02 NOTE — Progress Notes (Signed)
  Subjective:  Patient ID: Courtney Murphy, female    DOB: [birth year],  MRN: [id number]  Chief Complaint  Patient presents with  . Foot Pain    "its been swelling and hurting off and on."    65 y.o. female returns with the above complaint. History confirmed with patient.  Swelling has worsened as well as pain in the forefoot  Objective:  Physical Exam: warm, good capillary refill, no trophic changes or ulcerative lesions, normal DP and PT pulses and had abnormal monofilament exam. Left Foot: Moderate edema of the forefoot, she has pain on palpation of the fourth metatarsal.  Radiographs: X-ray of the left foot: No appreciable change since last x-ray. Assessment:   1. Charcot's joint of left foot      Plan:  Patient was evaluated and treated and all questions answered.  -Think she should return to the CAM boot for now until this calms down again.  I discussed it is likely wax and wane with Charcot flaring.  Reevaluation with a new x-ray if she is still painful and swollen.  Return in about 1 week (around 02/05/2021) for left foot, new x-ray if swollen still.

## 2021-02-05 ENCOUNTER — Ambulatory Visit: Payer: BC Managed Care – PPO | Admitting: Podiatry

## 2021-02-05 ENCOUNTER — Encounter: Payer: Self-pay | Admitting: Podiatry

## 2021-02-05 ENCOUNTER — Inpatient Hospital Stay: Payer: BC Managed Care – PPO

## 2021-02-05 ENCOUNTER — Other Ambulatory Visit: Payer: Self-pay

## 2021-02-05 VITALS — BP 122/70 | HR 98 | Temp 95.1°F | Resp 20

## 2021-02-05 DIAGNOSIS — K909 Intestinal malabsorption, unspecified: Secondary | ICD-10-CM

## 2021-02-05 DIAGNOSIS — M14672 Charcot's joint, left ankle and foot: Secondary | ICD-10-CM

## 2021-02-05 DIAGNOSIS — E1142 Type 2 diabetes mellitus with diabetic polyneuropathy: Secondary | ICD-10-CM

## 2021-02-05 DIAGNOSIS — D509 Iron deficiency anemia, unspecified: Secondary | ICD-10-CM | POA: Diagnosis not present

## 2021-02-05 DIAGNOSIS — S92335D Nondisplaced fracture of third metatarsal bone, left foot, subsequent encounter for fracture with routine healing: Secondary | ICD-10-CM | POA: Diagnosis not present

## 2021-02-05 DIAGNOSIS — S92342D Displaced fracture of fourth metatarsal bone, left foot, subsequent encounter for fracture with routine healing: Secondary | ICD-10-CM | POA: Diagnosis not present

## 2021-02-05 MED ORDER — SODIUM CHLORIDE 0.9 % IV SOLN: 200.0000 mg | Freq: Once | INTRAVENOUS | Status: DC

## 2021-02-05 MED ORDER — IRON SUCROSE 20 MG/ML IV SOLN
200.0000 mg | Freq: Once | INTRAVENOUS | Status: AC
  Administered 2021-02-05: 200 mg via INTRAVENOUS
  Filled 2021-02-05: qty 10

## 2021-02-05 MED ORDER — SODIUM CHLORIDE 0.9 % IV SOLN
Freq: Once | INTRAVENOUS | Status: AC
  Filled 2021-02-05: qty 250

## 2021-02-05 NOTE — Progress Notes (Signed)
  Subjective:  Patient ID: Courtney Murphy, female    DOB: [birth year],  MRN: [id number]  Chief Complaint  Patient presents with  . charcot    "its a lot better.  Still a little puffy but not near as sore as it was.  I have been wearing compression socks and it has helped"    65 y.o. female returns with the above complaint. History confirmed with patient.  Says it feels better she has improvement in swelling and pain.  She is going to [place] on her trip Monday  Objective:  Physical Exam: warm, good capillary refill, no trophic changes or ulcerative lesions, normal DP and PT pulses and had abnormal monofilament exam. Left Foot: Today with minimal edema, no warmth, minimal pain over the fourth metatarsal  Radiographs: X-ray of the left foot: No appreciable change since last x-ray. Assessment:   1. Charcot's joint of left foot   2. Closed displaced fracture of fourth metatarsal bone of left foot with routine healing, subsequent encounter   3. Type 2 diabetes mellitus with diabetic polyneuropathy, without long-term current use of insulin (HCC)   4. Closed nondisplaced fracture of third metatarsal bone of left foot with routine healing, subsequent encounter      Plan:  Patient was evaluated and treated and all questions answered.  -Seems to be improving again.  I discussed with her that this will likely wax and wane for some time and she would likely never get rid of this condition.  Advised her to wear the boot as much as possible and to take it on her trip with her.  I like to see her back in 4 weeks after she returns.  I completed a work release paperwork to resume work at [employer] on April 1  Return in about 4 weeks (around 03/05/2021).

## 2021-02-26 ENCOUNTER — Ambulatory Visit: Payer: BC Managed Care – PPO | Admitting: Podiatry

## 2021-02-26 ENCOUNTER — Ambulatory Visit (INDEPENDENT_AMBULATORY_CARE_PROVIDER_SITE_OTHER): Payer: BC Managed Care – PPO

## 2021-02-26 ENCOUNTER — Other Ambulatory Visit: Payer: Self-pay | Admitting: Podiatry

## 2021-02-26 ENCOUNTER — Other Ambulatory Visit: Payer: Self-pay

## 2021-02-26 ENCOUNTER — Encounter: Payer: Self-pay | Admitting: Podiatry

## 2021-02-26 DIAGNOSIS — S92335D Nondisplaced fracture of third metatarsal bone, left foot, subsequent encounter for fracture with routine healing: Secondary | ICD-10-CM

## 2021-02-26 DIAGNOSIS — M79604 Pain in right leg: Secondary | ICD-10-CM | POA: Diagnosis not present

## 2021-02-26 DIAGNOSIS — M778 Other enthesopathies, not elsewhere classified: Secondary | ICD-10-CM

## 2021-02-26 DIAGNOSIS — M14672 Charcot's joint, left ankle and foot: Secondary | ICD-10-CM

## 2021-02-26 DIAGNOSIS — S92342D Displaced fracture of fourth metatarsal bone, left foot, subsequent encounter for fracture with routine healing: Secondary | ICD-10-CM

## 2021-02-26 NOTE — Progress Notes (Signed)
  Subjective:  Patient ID: Courtney Murphy, female    DOB: [birth year],  MRN: [id number]  Chief Complaint  Patient presents with  . charcot    "its not as bad, but still swells and hurts.  Im starting to have pain in my right foot now"    65 y.o. female returns with the above complaint. History confirmed with patient.  She has returned from her trip from [place] and is doing well.  Has some pain and swelling in the left foot and feels a little tenderness on the right foot as well but overall doing pretty good.  Objective:  Physical Exam: warm, good capillary refill, no trophic changes or ulcerative lesions, normal DP and PT pulses and had abnormal monofilament exam. Left Foot: Today with minimal edema, no warmth, minimal pain over the fourth metatarsal Right foot mild edema over dorsal midfoot  Radiographs: X-ray of the both feet: No appreciable change in fracture alignment, they appear to be well-healed at this point, no increased fragmentation anywhere in the over midfoot or hindfoot either foot a Assessment:   1. Charcot's joint of left foot   2. Pain of right lower extremity   3. Closed displaced fracture of fourth metatarsal bone of left foot with routine healing, subsequent encounter   4. Capsulitis of foot, right   5. Closed nondisplaced fracture of third metatarsal bone of left foot with routine healing, subsequent encounter      Plan:  Patient was evaluated and treated and all questions answered.  -Doing very well and I think she is in the quiescent phase.  We reviewed the long-term impact of Charcot and how this could always come back as a flare at any point.  I encouraged her to wear supportive shoe gear.  She does have a fairly demanding job on her feet and ideally the left demanding job would help her long-term I think that this is not feasible at this point for her.  Her A1c has come down to 8.2%, it was nearly 10% in the fall.  She is continue working on this I think if  she can get in the goal A1c of 7% she can hopefully reduce her risk of future flaring of Charcot.  At this point return if she has further issues  Return if symptoms worsen or fail to improve.

## 2021-03-05 ENCOUNTER — Ambulatory Visit: Payer: BC Managed Care – PPO | Admitting: Podiatry

## 2021-05-06 ENCOUNTER — Other Ambulatory Visit: Payer: Self-pay | Admitting: *Deleted

## 2021-05-06 DIAGNOSIS — K909 Intestinal malabsorption, unspecified: Secondary | ICD-10-CM

## 2021-05-06 DIAGNOSIS — R760 Raised antibody titer: Secondary | ICD-10-CM

## 2021-05-07 ENCOUNTER — Inpatient Hospital Stay: Payer: BC Managed Care – PPO

## 2021-05-07 ENCOUNTER — Inpatient Hospital Stay: Payer: BC Managed Care – PPO | Admitting: Internal Medicine

## 2021-05-19 ENCOUNTER — Telehealth: Payer: Self-pay | Admitting: Internal Medicine

## 2021-05-19 NOTE — Telephone Encounter (Signed)
Patient called requesting to reschedule her 6/21 appointments due to starting a new job.  Appointments rescheduled to 6/27, confirmed.

## 2021-05-20 ENCOUNTER — Inpatient Hospital Stay: Payer: BC Managed Care – PPO

## 2021-05-20 ENCOUNTER — Inpatient Hospital Stay: Payer: BC Managed Care – PPO | Admitting: Internal Medicine

## 2021-05-26 ENCOUNTER — Other Ambulatory Visit: Payer: Self-pay

## 2021-05-26 ENCOUNTER — Inpatient Hospital Stay (HOSPITAL_BASED_OUTPATIENT_CLINIC_OR_DEPARTMENT_OTHER): Payer: BC Managed Care – PPO | Admitting: Internal Medicine

## 2021-05-26 ENCOUNTER — Encounter: Payer: Self-pay | Admitting: Internal Medicine

## 2021-05-26 ENCOUNTER — Inpatient Hospital Stay: Payer: BC Managed Care – PPO | Attending: Internal Medicine

## 2021-05-26 ENCOUNTER — Inpatient Hospital Stay: Payer: BC Managed Care – PPO

## 2021-05-26 DIAGNOSIS — R760 Raised antibody titer: Secondary | ICD-10-CM

## 2021-05-26 DIAGNOSIS — D509 Iron deficiency anemia, unspecified: Secondary | ICD-10-CM | POA: Insufficient documentation

## 2021-05-26 DIAGNOSIS — Z9884 Bariatric surgery status: Secondary | ICD-10-CM | POA: Insufficient documentation

## 2021-05-26 DIAGNOSIS — E538 Deficiency of other specified B group vitamins: Secondary | ICD-10-CM | POA: Insufficient documentation

## 2021-05-26 DIAGNOSIS — Z86711 Personal history of pulmonary embolism: Secondary | ICD-10-CM | POA: Diagnosis not present

## 2021-05-26 DIAGNOSIS — Z87891 Personal history of nicotine dependence: Secondary | ICD-10-CM | POA: Insufficient documentation

## 2021-05-26 DIAGNOSIS — Z803 Family history of malignant neoplasm of breast: Secondary | ICD-10-CM | POA: Diagnosis not present

## 2021-05-26 DIAGNOSIS — K909 Intestinal malabsorption, unspecified: Secondary | ICD-10-CM

## 2021-05-26 LAB — CBC
HCT: 38.7 % (ref 36.0–46.0)
Hemoglobin: 13.1 g/dL (ref 12.0–15.0)
MCH: 31 pg (ref 26.0–34.0)
MCHC: 33.9 g/dL (ref 30.0–36.0)
MCV: 91.5 fL (ref 80.0–100.0)
Platelets: 270 10*3/uL (ref 150–400)
RBC: 4.23 MIL/uL (ref 3.87–5.11)
RDW: 12.4 % (ref 11.5–15.5)
WBC: 11.3 10*3/uL — ABNORMAL HIGH (ref 4.0–10.5)
nRBC: 0 % (ref 0.0–0.2)

## 2021-05-26 LAB — BASIC METABOLIC PANEL
Anion gap: 13 (ref 5–15)
BUN: 19 mg/dL (ref 8–23)
CO2: 26 mmol/L (ref 22–32)
Calcium: 9.2 mg/dL (ref 8.9–10.3)
Chloride: 95 mmol/L — ABNORMAL LOW (ref 98–111)
Creatinine, Ser: 0.99 mg/dL (ref 0.44–1.00)
GFR, Estimated: >60 mL/min (ref >60)
Glucose, Bld: 272 mg/dL — ABNORMAL HIGH (ref 70–99)
Potassium: 4.4 mmol/L (ref 3.5–5.1)
Sodium: 134 mmol/L — ABNORMAL LOW (ref 135–145)

## 2021-05-26 NOTE — Progress Notes (Signed)
[facility] NOTE  Patient Care Team: Audelia Acton, MD as PCP - General (Internal Medicine)  CHIEF COMPLAINTS/PURPOSE OF CONSULTATION: DVT/PE  # PE [> 10-15 years] x 6 months of coumadin [3-4 months prior TAH]; No provoking causes; No Family Hx of blood clots; NO BCPs; no miscarriages;OCT 2021 [incidental rheumatologic work-up]-lupus anticoagulant-NEG; Beta2 IgG- 56 [H]; anticardiolipin-IgM -indeterminate [>13]; REPEAT APS WORK-UP JAN 2022-anticardiolipin IgG 55; rest of the work-up unremarkable-would not recommend anticoagulation  # Gastric By pass [lost 80 pounds; 2011]; JAN 2022-iron deficiency noted; IV Venofer  # Joint pains [Dr.Patel; [facility]] Oncology History  Antiphospholipid antibody positive     HISTORY OF PRESENTING ILLNESS:  Courtney Murphy 65 y.o.  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  Patient complains of easy bruising especially in the dorsum of the hand.  Denies any bleeding gums or nosebleeds.   Review of Systems  Constitutional:  Positive for malaise/fatigue. Negative for chills, diaphoresis, fever and weight loss.  HENT:  Negative for nosebleeds and sore throat.   Eyes:  Negative for double vision.  Respiratory:  Negative for cough, hemoptysis, sputum production, shortness of breath and wheezing.   Cardiovascular:  Negative for chest pain, palpitations, orthopnea and leg swelling.  Gastrointestinal:  Negative for abdominal pain, blood in stool, constipation, diarrhea, heartburn, melena, nausea and vomiting.  Genitourinary:  Negative for dysuria, frequency and urgency.  Musculoskeletal:  Positive for back pain and joint pain.  Skin: Negative.  Negative for itching and rash.  Neurological:  Negative for dizziness, tingling, focal weakness, weakness and headaches.  Endo/Heme/Allergies:  Bruises/bleeds easily.   Psychiatric/Behavioral:  Negative for depression. The patient is not nervous/anxious and does not have insomnia.     MEDICAL HISTORY:  Past Medical History:  Diagnosis Date   Acid reflux 10/11/2015   Anemia    RECEIVES IRON INFUSIONS   Arthritis    Asthma    WELL CONTROLLED   Depression    Diabetes mellitus without complication ([place])    Fatty liver    Gout    History of kidney stones    History of methicillin resistant staphylococcus aureus (MRSA) 2016   Hypertension    H/O BEEN OFF BP MEDS FOR 1 YEAR   Hypothyroidism    Kidney stone    Knee pain    Left   Pulmonary emboli ([place]) 2011   Restless leg syndrome    Shoulder pain     SURGICAL HISTORY: Past Surgical History:  Procedure Laterality Date   ABDOMINAL HYSTERECTOMY     BACK SURGERY     BREAST REDUCTION SURGERY     CATARACT EXTRACTION W/PHACO Left 07/28/2017   Procedure: CATARACT EXTRACTION PHACO AND INTRAOCULAR LENS PLACEMENT ([place]) diabetic Left;  Surgeon: Leandrew Koyanagi, MD;  Location: [facility];  Service: Ophthalmology;  Laterality: Left;  Diabetic   CESAREAN SECTION  1990   CHOLECYSTECTOMY     CYSTOSCOPY/URETEROSCOPY/HOLMIUM LASER/STENT PLACEMENT Left 09/08/2017   Procedure: CYSTOSCOPY/URETEROSCOPY/HOLMIUM LASER/STENT EXCHANGE;  Surgeon: Hollice Espy, MD;  Location: [facility];  Service: Urology;  Laterality: Left;   CYSTOSCOPY/URETEROSCOPY/HOLMIUM LASER/STENT PLACEMENT Left 12/14/2018   Procedure: CYSTOSCOPY/URETEROSCOPY/HOLMIUM LASER/STENT PLACEMENT;  Surgeon: Hollice Espy, MD;  Location: [facility];  Service: Urology;  Laterality: Left;   FOOT SURGERY     GASTRIC BYPASS     IR NEPHROSTOMY PLACEMENT LEFT  08/17/2017   KNEE ARTHROSCOPY Left    KNEE  ARTHROSCOPY Left 07/19/2015   Procedure: ARTHROSCOPY KNEE;  Surgeon: Leanor Kail, MD;  Location: [facility];  Service: Orthopedics;  Laterality: Left;   NEPHROLITHOTOMY Left 08/17/2017   Procedure: NEPHROLITHOTOMY PERCUTANEOUS WITH HOLMIUM LASER;   Surgeon: Hollice Espy, MD;  Location: [facility];  Service: Urology;  Laterality: Left;   THUMB ARTHROSCOPY     TONSILLECTOMY      SOCIAL HISTORY: Social History   Socioeconomic History   Marital status: Widowed    Spouse name: Not on file   Number of children: Not on file   Years of education: Not on file   Highest education level: Not on file  Occupational History   Not on file  Tobacco Use   Smoking status: Former    Packs/day: 0.50    Years: 4.00    Pack years: 2.00    Types: Cigarettes    Quit date: 04/14/2015    Years since quitting: 6.1   Smokeless tobacco: Never  Vaping Use   Vaping Use: Never used  Substance and Sexual Activity   Alcohol use: Not Currently    Comment: RARE   Drug use: No   Sexual activity: Never  Other Topics Concern   Not on file  Social History Narrative   Quit smoking > 30 years ago; rare alcohol. Used to worked at [employer]; receptionist at [employer]. Lives in [place]; with dog.    Social Determinants of Health   Financial Resource Strain: Not on file  Food Insecurity: Not on file  Transportation Needs: Not on file  Physical Activity: Not on file  Stress: Not on file  Social Connections: Not on file  Intimate Partner Violence: Not on file    FAMILY HISTORY: Family History  Problem Relation Age of Onset   Cancer Mother        breast   Heart disease Father    Diabetes Father    Hypertension Father    Cancer Maternal Grandmother    Cancer Maternal Grandfather    Heart disease Paternal Grandmother    Heart disease Paternal Grandfather    Cancer Maternal Aunt    Cancer Maternal Uncle    Heart disease Paternal Aunt    Heart disease Paternal Uncle    Kidney cancer Neg Hx    Bladder Cancer Neg Hx     ALLERGIES:  is allergic to sulfasalazine, nsaids, sulfa antibiotics, and penicillins.  MEDICATIONS:  Current Outpatient Medications  Medication Sig Dispense Refill   atomoxetine (STRATTERA) 40 MG  capsule      atorvastatin (LIPITOR) 20 MG tablet Take 20 mg by mouth every morning.      cyanocobalamin (,VITAMIN B-12,) 1000 MCG/ML injection Inject 1,000 mcg into the muscle once a week.     cyclobenzaprine (FLEXERIL) 10 MG tablet Take 10 mg by mouth every 8 (eight) hours.     Dulaglutide 0.75 MG/0.5ML SOPN Inject 0.75 mg into the skin every Thursday.      DULoxetine HCl 40 MG CPEP Take 1 capsule by mouth daily.     furosemide (LASIX) 20 MG tablet Take by mouth.     gabapentin (NEURONTIN) 300 MG capsule Take 300 mg by mouth at bedtime.     HYDROcodone-acetaminophen (NORCO/VICODIN) 5-325 MG tablet Take 1 tablet by mouth 3 (three) times daily as needed.     levothyroxine (SYNTHROID, LEVOTHROID) 100 MCG tablet Take 100 mcg by mouth daily before breakfast.     lisinopril (ZESTRIL) 20 MG tablet Take 20 mg by mouth daily.  meloxicam (MOBIC) 15 MG tablet Take 15 mg by mouth daily.     metFORMIN (GLUCOPHAGE-XR) 500 MG 24 hr tablet Take 500 mg by mouth daily.     metFORMIN (GLUCOPHAGE-XR) 750 MG 24 hr tablet Take 1,500 mg by mouth every morning.     pramipexole (MIRAPEX) 0.5 MG tablet Take 0.5-1 mg by mouth at bedtime.      No current facility-administered medications for this visit.      Marland Kitchen  PHYSICAL EXAMINATION:  Vitals:   05/26/21 1031  BP: 109/66  Pulse: (!) 104  Resp: 16  Temp: (!) 96.9 F (36.1 C)  SpO2: 100%   Filed Weights   05/26/21 1031  Weight: 180 lb (81.6 kg)    Physical Exam HENT:     Head: Normocephalic and atraumatic.     Mouth/Throat:     Pharynx: No oropharyngeal exudate.  Eyes:     Pupils: Pupils are equal, round, and reactive to light.  Cardiovascular:     Rate and Rhythm: Normal rate and regular rhythm.  Pulmonary:     Effort: Pulmonary effort is normal. No respiratory distress.     Breath sounds: Normal breath sounds. No wheezing.  Abdominal:     General: Bowel sounds are normal. There is no distension.     Palpations: Abdomen is soft. There is no  mass.     Tenderness: There is no abdominal tenderness. There is no guarding or rebound.  Musculoskeletal:        General: No tenderness. Normal range of motion.     Cervical back: Normal range of motion and neck supple.  Skin:    General: Skin is warm.     Comments: Chronic bruising noted on the upper extremities.  No bruises on the trunk or lower extremities.  Neurological:     Mental Status: She is alert and oriented to person, place, and time.  Psychiatric:        Mood and Affect: Affect normal.     LABORATORY DATA:  I have reviewed the data as listed Lab Results  Component Value Date   WBC 11.3 (H) 05/26/2021   HGB 13.1 05/26/2021   HCT 38.7 05/26/2021   MCV 91.5 05/26/2021   PLT 270 05/26/2021   Recent Labs    05/26/20 1545 07/17/20 1242 12/23/20 1107 05/26/21 0951  NA 138 143 137 134*  K 4.0 4.9 4.2 4.4  CL 101 103 100 95*  CO2 24 26 26 26   GLUCOSE 265* 145* 207* 272*  BUN 17 10 15 19   CREATININE 0.94 0.84 0.76 0.99  CALCIUM 8.8* 9.5 8.9 9.2  GFRNONAA >60 74 >60 >60  GFRAA >60 85  --   --   PROT  --  6.6 6.9  --   ALBUMIN  --  4.4 3.8  --   AST  --  16 16  --   ALT  --  20 20  --   ALKPHOS  --  168* 107  --   BILITOT  --  0.3 0.2*  --     RADIOGRAPHIC STUDIES: I have personally reviewed the radiological images as listed and agreed with the findings in the report. No results found.   ASSESSMENT & PLAN:   Antiphospholipid antibody positive #-anticardiolipin IgG [mildly positive]; beta-2 glycoprotein IgM positive [Incidental; rheumatology].  Prior history of PE 10-15 years ago; currently not on  anticoagulation.  Not recommend any anticoagulation just based on abnormal serology-especially if lupus anticoagulant is negative.  Again reiterated  my recommendations with patient in detail.  #Iron deficiency anemia [ gastric bypass-]-low iron saturation/ferritin.  S/p IV Venofer; hemoglobin is 12.5.  HOLD venofer today-   # b12 def-continue home B12  injections.  #Easy bruising dorsum of the hands-PT PTT-unremarkable.;  Do not suspect any platelet dysfunction.  However patient has been on steroids multiple times/which could potentially cause skin thinning.  Recommend moisturizing lotions avoiding trauma.  # DISPOSITION: # HOLD Venofer today # follow up in 4 months-MD; labs- cbc/bmp possible venofer-Dr.B   All questions were answered. The patient knows to call the clinic with any problems, questions or concerns.   Cammie Sickle, MD 05/26/2021 1:46 PM

## 2021-05-26 NOTE — Assessment & Plan Note (Addendum)
#-  anticardiolipin IgG [mildly positive]; beta-2 glycoprotein IgM positive [Incidental; rheumatology].  Prior history of PE 10-15 years ago; currently not on  anticoagulation.  Not recommend any anticoagulation just based on abnormal serology-especially if lupus anticoagulant is negative.  Again reiterated my recommendations with patient in detail.  #Iron deficiency anemia [ gastric bypass-]-low iron saturation/ferritin.  S/p IV Venofer; hemoglobin is 12.5.  HOLD venofer today-   # b12 def-continue home B12 injections.  #Easy bruising dorsum of the hands-PT PTT-unremarkable.;  Do not suspect any platelet dysfunction.  However patient has been on steroids multiple times/which could potentially cause skin thinning.  Recommend moisturizing lotions avoiding trauma.  # DISPOSITION: # HOLD Venofer today # follow up in 4 months-MD; labs- cbc/bmp possible venofer-Dr.B

## 2021-05-26 NOTE — Progress Notes (Signed)
Wants her hands looked at. Has places all over that looks bruised. States she does have lightheadedness and dizziness from time to time.

## 2021-08-19 ENCOUNTER — Other Ambulatory Visit: Payer: Self-pay

## 2021-08-19 ENCOUNTER — Encounter: Payer: Self-pay | Admitting: Emergency Medicine

## 2021-08-19 DIAGNOSIS — Z7984 Long term (current) use of oral hypoglycemic drugs: Secondary | ICD-10-CM | POA: Diagnosis not present

## 2021-08-19 DIAGNOSIS — E785 Hyperlipidemia, unspecified: Secondary | ICD-10-CM | POA: Diagnosis not present

## 2021-08-19 DIAGNOSIS — J45909 Unspecified asthma, uncomplicated: Secondary | ICD-10-CM | POA: Diagnosis not present

## 2021-08-19 DIAGNOSIS — Z79899 Other long term (current) drug therapy: Secondary | ICD-10-CM | POA: Diagnosis not present

## 2021-08-19 DIAGNOSIS — Z87891 Personal history of nicotine dependence: Secondary | ICD-10-CM | POA: Insufficient documentation

## 2021-08-19 DIAGNOSIS — E039 Hypothyroidism, unspecified: Secondary | ICD-10-CM | POA: Insufficient documentation

## 2021-08-19 DIAGNOSIS — E1169 Type 2 diabetes mellitus with other specified complication: Secondary | ICD-10-CM | POA: Diagnosis not present

## 2021-08-19 DIAGNOSIS — I10 Essential (primary) hypertension: Secondary | ICD-10-CM | POA: Diagnosis not present

## 2021-08-19 DIAGNOSIS — M79662 Pain in left lower leg: Secondary | ICD-10-CM | POA: Insufficient documentation

## 2021-08-19 DIAGNOSIS — M79661 Pain in right lower leg: Secondary | ICD-10-CM | POA: Insufficient documentation

## 2021-08-19 NOTE — ED Triage Notes (Addendum)
Pt arrived via POV with reports of bilateral leg pain x 2 days, pt states the pain starts from the hip down, but also states the pain is primarily in her calf. Pt states she is taking hydrocodone at home, last dose was a 3pm. Pt also c/o feeling weak and has been unsteady over the past several days,  Pt also reports soft tissue injury to L ankle has ortho boot from [facility] in place.

## 2021-08-20 ENCOUNTER — Encounter: Payer: Self-pay | Admitting: Internal Medicine

## 2021-08-20 ENCOUNTER — Emergency Department: Payer: Medicare Other

## 2021-08-20 ENCOUNTER — Emergency Department
Admission: EM | Admit: 2021-08-20 | Discharge: 2021-08-20 | Disposition: A | Discharge status: Home / Self Care | Payer: Medicare Other | Attending: Emergency Medicine | Admitting: Emergency Medicine

## 2021-08-20 DIAGNOSIS — M79604 Pain in right leg: Secondary | ICD-10-CM

## 2021-08-20 DIAGNOSIS — M79661 Pain in right lower leg: Secondary | ICD-10-CM | POA: Diagnosis not present

## 2021-08-20 LAB — URINALYSIS, COMPLETE (UACMP) WITH MICROSCOPIC
Bacteria, UA: NONE SEEN
Bilirubin Urine: NEGATIVE
Glucose, UA: NEGATIVE mg/dL
Hgb urine dipstick: NEGATIVE
Nitrite: NEGATIVE
Protein, ur: NEGATIVE mg/dL
Specific Gravity, Urine: 1.02 (ref 1.005–1.030)
pH: 5.5 (ref 5.0–8.0)

## 2021-08-20 LAB — CBC
HCT: 33.1 % — ABNORMAL LOW (ref 36.0–46.0)
Hemoglobin: 11.6 g/dL — ABNORMAL LOW (ref 12.0–15.0)
MCH: 33 pg (ref 26.0–34.0)
MCHC: 35 g/dL (ref 30.0–36.0)
MCV: 94.3 fL (ref 80.0–100.0)
Platelets: 287 10*3/uL (ref 150–400)
RBC: 3.51 MIL/uL — ABNORMAL LOW (ref 3.87–5.11)
RDW: 12.3 % (ref 11.5–15.5)
WBC: 11.9 10*3/uL — ABNORMAL HIGH (ref 4.0–10.5)
nRBC: 0 % (ref 0.0–0.2)

## 2021-08-20 LAB — BASIC METABOLIC PANEL
Anion gap: 11 (ref 5–15)
BUN: 20 mg/dL (ref 8–23)
CO2: 30 mmol/L (ref 22–32)
Calcium: 9.3 mg/dL (ref 8.9–10.3)
Chloride: 97 mmol/L — ABNORMAL LOW (ref 98–111)
Creatinine, Ser: 0.95 mg/dL (ref 0.44–1.00)
GFR, Estimated: >60 mL/min (ref >60)
Glucose, Bld: 180 mg/dL — ABNORMAL HIGH (ref 70–99)
Potassium: 4 mmol/L (ref 3.5–5.1)
Sodium: 138 mmol/L (ref 135–145)

## 2021-08-20 MED ORDER — MORPHINE SULFATE (PF) 4 MG/ML IV SOLN
4.0000 mg | Freq: Once | INTRAVENOUS | Status: AC
  Administered 2021-08-20: 4 mg via INTRAMUSCULAR
  Filled 2021-08-20: qty 1

## 2021-08-20 MED ORDER — OXYCODONE-ACETAMINOPHEN 5-325 MG PO TABS
2.0000 | ORAL_TABLET | Freq: Once | ORAL | Status: AC
  Administered 2021-08-20: 2 via ORAL
  Filled 2021-08-20: qty 2

## 2021-08-20 NOTE — ED Provider Notes (Signed)
[facility] Emergency Department Provider Note  ____________________________________________   Event Date/Time   First MD Initiated Contact with Patient 08/20/21 0210     (approximate)  I have reviewed the triage vital signs and the nursing notes.   HISTORY  Chief Complaint Leg Pain    HPI Courtney Murphy is a 65 y.o. female history of previous PE in 2004 no longer on anticoagulation, hypertension, diabetes, restless leg syndrome who presents to the emergency department with severe pain in her bilateral calf for the past 2 weeks worse over the past 2 days.  States her legs feel weak and she has felt some numbness in the left leg for 2 weeks.  Denies neck or back pain.  No bowel or bladder incontinence.  No urinary retention.  No fever.  No injury to her legs.  States she is being followed by [facility] for left ankle pain and swelling that occurred recently but again denies any injury.  She states x-rays as an outpatient were negative.  She is currently in a walking boot.  States she has to stand while at work.  States she works at [employer] only part-time but has to stand for about 4 hours a day.  No chest pain or shortness of breath.  Pain worse with standing.  She is able to ambulate.         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/06/2021   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   Anemia 07/17/2020    Asthma 07/17/2020   Degeneration of lumbar intervertebral disc 07/17/2020   Degenerative joint disease of hand 07/17/2020   Dyslipidemia 07/17/2020   Hip pain 07/17/2020   History of lumbar laminectomy 07/17/2020   Hypertensive disorder 07/17/2020   Joint pain 07/17/2020   Depression 07/17/2020   Obesity 07/17/2020   Sprain of left wrist 07/10/2020   Spinal stenosis of lumbar region 01/01/2020   Constipation 08/11/2019   Benign essential hypertension 05/23/2019   Unstable angina ([place]) 01/14/2019   Closed fracture of distal end of radius 10/03/2018   Kidney stone 08/17/2017   Iron deficiency 08/12/2017   RLS (restless legs syndrome) 07/26/2017   Type 2 diabetes mellitus without complication, without long-term current use of insulin ([place]) 04/22/2017   Chest pain 09/30/2016   Acid reflux 10/11/2015   Adult hypothyroidism 10/11/2015   ADHD (attention deficit hyperactivity disorder) 10/11/2015   Radiculopathy, lumbar region 08/13/2015   Tear of meniscus of knee 07/11/2015   Left shoulder pain 06/18/2015   Synovitis of knee 05/13/2015   Low back pain 04/30/2015   Acute bronchitis due to infection 11/15/2014   Bariatric surgery status 10/22/2014   History of colonic polyps 08/16/2014   Routine history and physical examination of adult 06/14/2014  [place] arthritis 05/18/2014   De Quervain's tenosynovitis, left 05/18/2014   Herpes genitalis 04/10/2014   Insomnia 04/10/2014   Lateral epicondylitis 02/26/2014   Acne 10/06/2013   HLD (hyperlipidemia) 08/18/2013   B12 deficiency 05/25/2013   Trochanteric bursitis, right hip 05/03/2013    Past Surgical History:  Procedure Laterality Date   ABDOMINAL HYSTERECTOMY     BACK SURGERY     BREAST REDUCTION SURGERY     CATARACT EXTRACTION W/PHACO Left 07/28/2017   Procedure: CATARACT EXTRACTION PHACO AND INTRAOCULAR LENS PLACEMENT ([place]) diabetic Left;  Surgeon: Leandrew Koyanagi, MD;  Location: [place];  Service:  Ophthalmology;  Laterality: Left;  Diabetic   CESAREAN SECTION  1990   CHOLECYSTECTOMY     CYSTOSCOPY/URETEROSCOPY/HOLMIUM LASER/STENT PLACEMENT Left 09/08/2017   Procedure: CYSTOSCOPY/URETEROSCOPY/HOLMIUM LASER/STENT EXCHANGE;  Surgeon: Hollice Espy, MD;  Location: [facility];  Service: Urology;  Laterality: Left;   CYSTOSCOPY/URETEROSCOPY/HOLMIUM LASER/STENT PLACEMENT Left 12/14/2018   Procedure: CYSTOSCOPY/URETEROSCOPY/HOLMIUM LASER/STENT PLACEMENT;  Surgeon: Hollice Espy, MD;  Location: [facility];  Service: Urology;  Laterality: Left;   FOOT SURGERY     GASTRIC BYPASS     IR NEPHROSTOMY PLACEMENT LEFT  08/17/2017   KNEE ARTHROSCOPY Left    KNEE ARTHROSCOPY Left 07/19/2015   Procedure: ARTHROSCOPY KNEE;  Surgeon: Leanor Kail, MD;  Location: [facility];  Service: Orthopedics;  Laterality: Left;   NEPHROLITHOTOMY Left 08/17/2017   Procedure: NEPHROLITHOTOMY PERCUTANEOUS WITH HOLMIUM LASER;  Surgeon: Hollice Espy, MD;  Location: [facility];  Service: Urology;  Laterality: Left;   THUMB ARTHROSCOPY     TONSILLECTOMY      Prior to Admission medications   Medication Sig Start Date End Date Taking? Authorizing Provider  atomoxetine (STRATTERA) 40 MG capsule  03/13/20   [provider]  atorvastatin (LIPITOR) 20 MG tablet Take 20 mg by mouth every morning.  06/27/18   [provider]  cyanocobalamin (,VITAMIN B-12,) 1000 MCG/ML injection Inject 1,000 mcg into the muscle once a week. 06/30/20   [provider]  cyclobenzaprine (FLEXERIL) 10 MG tablet Take 10 mg by mouth every 8 (eight) hours. 06/14/20   [provider]  Dulaglutide 0.75 MG/0.5ML SOPN Inject 0.75 mg into the skin every Thursday.     [provider]  DULoxetine HCl 40 MG CPEP Take 1 capsule by mouth daily. 05/26/21   [provider]  furosemide (LASIX) 20 MG tablet Take by mouth. 03/10/21 03/10/22  [provider]  gabapentin (NEURONTIN) 300 MG capsule Take 300 mg by mouth  at bedtime. 01/09/21   [provider]  HYDROcodone-acetaminophen (NORCO/VICODIN) 5-325 MG tablet Take 1 tablet by mouth 3 (three) times daily as needed. 07/07/20   [provider]  levothyroxine (SYNTHROID, LEVOTHROID) 100 MCG tablet Take 100 mcg by mouth daily before breakfast.    [provider]  lisinopril (ZESTRIL) 20 MG tablet Take 20 mg by mouth daily. 04/25/19   [provider]  meloxicam (MOBIC) 15 MG tablet Take 15 mg by mouth daily. 05/07/21   [provider]  metFORMIN (GLUCOPHAGE-XR) 500 MG 24 hr tablet Take 500 mg by mouth daily. 01/04/21   [provider]  metFORMIN (GLUCOPHAGE-XR) 750 MG 24 hr tablet Take 1,500 mg by mouth every morning. 03/10/21   [provider]  pramipexole (MIRAPEX) 0.5 MG tablet Take 0.5-1 mg by mouth at bedtime.     [provider]    Allergies Sulfasalazine, Nsaids, Sulfa antibiotics, and Penicillins  Family History  Problem Relation Age of Onset  Cancer Mother        breast   Heart disease Father    Diabetes Father    Hypertension Father    Cancer Maternal Grandmother    Cancer Maternal Grandfather    Heart disease Paternal Grandmother    Heart disease Paternal Grandfather    Cancer Maternal Aunt    Cancer Maternal Uncle    Heart disease Paternal Aunt    Heart disease Paternal Uncle    Kidney cancer Neg Hx    Bladder Cancer Neg Hx     Social History Social History   Tobacco Use   Smoking status: Former    Packs/day: 0.50    Years: 4.00    Pack years: 2.00    Types: Cigarettes    Quit date: 04/14/2015    Years since quitting: 6.3   Smokeless tobacco: Never  Vaping Use   Vaping Use: Never used  Substance Use Topics   Alcohol use: Not Currently    Comment: RARE   Drug use: No    Review of Systems Constitutional: No fever. Eyes: No visual changes. ENT: No sore throat. Cardiovascular: Denies chest pain. Respiratory: Denies shortness of breath. Gastrointestinal:  No nausea, vomiting, diarrhea. Genitourinary: Negative for dysuria. Musculoskeletal: Negative for back pain. Skin: Negative for rash. Neurological: Negative for focal weakness or numbness.  ____________________________________________   PHYSICAL EXAM:  VITAL SIGNS: ED Triage Vitals  Enc Vitals Group     BP 08/19/21 2358 118/66     Pulse Rate 08/19/21 2358 87     Resp 08/19/21 2358 18     Temp 08/19/21 2358 98.5 F (36.9 C)     Temp Source 08/19/21 2358 Oral     SpO2 08/19/21 2358 96 %     Weight 08/19/21 2357 180 lb (81.6 kg)     Height 08/19/21 2357 5\' 4"  (1.626 m)     Head Circumference --      Peak Flow --      Pain Score 08/19/21 2358 8     Pain Loc --      Pain Edu? --      Excl. in [place]? --    CONSTITUTIONAL: Alert and oriented and responds appropriately to questions. Well-appearing; well-nourished HEAD: Normocephalic EYES: Conjunctivae clear, pupils appear equal, EOM appear intact ENT: normal nose; moist mucous membranes NECK: Supple, normal ROM CARD: RRR; S1 and S2 appreciated; no murmurs, no clicks, no rubs, no gallops RESP: Normal chest excursion without splinting or tachypnea; breath sounds clear and equal bilaterally; no wheezes, no rhonchi, no rales, no hypoxia or respiratory distress, speaking full sentences ABD/GI: Normal bowel sounds; non-distended; soft, non-tender, no rebound, no guarding, no peritoneal signs, no hepatosplenomegaly BACK: The back appears normal EXT: Normal ROM in all joints; tender to palpation in her calves bilaterally without swelling.  No redness or warmth.  Extremities are well-perfused.  2+ DP pulses bilaterally.  Compartments soft.  No rash or other lesions.  No bony deformity or bony tenderness.  No joint effusions.  Normal capillary refill.  She does have a small amount of swelling noted to the soft tissues of the left ankle compared to the right but no pitting edema. SKIN: Normal color for age and race; warm; no rash on exposed  skin NEURO: Moves all extremities equally, strength 5/5 all 4 extremities, normal sensation diffusely, no saddle anesthesia, no clonus, normal gait PSYCH: The patient's mood and manner are appropriate.  ____________________________________________   LABS (all labs ordered are listed, but only  abnormal results are displayed)  Labs Reviewed  BASIC METABOLIC PANEL - Abnormal; Notable for the following components:      Result Value   Chloride 97 (*)    Glucose, Bld 180 (*)    All other components within normal limits  CBC - Abnormal; Notable for the following components:   WBC 11.9 (*)    RBC 3.51 (*)    Hemoglobin 11.6 (*)    HCT 33.1 (*)    All other components within normal limits  URINALYSIS, COMPLETE (UACMP) WITH MICROSCOPIC - Abnormal; Notable for the following components:   Ketones, ur TRACE (*)    Leukocytes,Ua TRACE (*)    All other components within normal limits   ____________________________________________  EKG   ____________________________________________  RADIOLOGY I, Arslan Kier, personally viewed and evaluated these images (plain radiographs) as part of my medical decision making, as well as reviewing the written report by the radiologist.  ED MD interpretation: Venous Doppler shows no DVT.  Official radiology report(s): No results found.  ____________________________________________   PROCEDURES  Procedure(s) performed (including Critical Care):  Procedures   ____________________________________________   INITIAL IMPRESSION / ASSESSMENT AND PLAN / ED COURSE  As part of my medical decision making, I reviewed the following data within the [facility] notes reviewed and incorporated, Labs reviewed , Old chart reviewed, ultrasound reviewed, Notes from prior ED visits, and [place] Controlled Substance Database         Patient here with complaints of bilateral calf pain that has been ongoing for a couple of weeks.  There is no  sign of arterial obstruction, cellulitis, septic arthritis, gout, compartment syndrome, fracture or dislocation on exam.  Venous Dopplers were obtained in triage which showed no DVT.  She does have a mild amount of soft tissue swelling in the left ankle and is being followed by [facility] for this and is in a walking boot.  She is able to ambulate here.  She is neurologically intact with normal sensation and strength.  She denies any neck or back pain.  I do not think that this is cauda equina, spinal stenosis, Guillain-Barr, transverse myelitis, discitis or osteomyelitis, epidural abscess or hematoma.  She has no chest pain or shortness of breath.  Doubt PE.  Unclear etiology for patient's pain but I do not think that there is anything emergent present today.  We will provide her with a work note.  She has received pain medication here and has family to drive her home.  It does appear that she receives Vicodin on a regular basis from her primary care provider.  Discussed with her that she will need to discuss with her doctor about having any adjustments in her pain medication regimen for further pain control.  Patient verbalized understanding and is comfortable with this plan.   At this time, I do not feel there is any life-threatening condition present. I have reviewed, interpreted and discussed all results (EKG, imaging, lab, urine as appropriate) and exam findings with patient/family. I have reviewed nursing notes and appropriate previous records.  I feel the patient is safe to be discharged home without further emergent workup and can continue workup as an outpatient as needed. Discussed usual and customary return precautions. Patient/family verbalize understanding and are comfortable with this plan.  Outpatient follow-up has been provided as needed. All questions have been answered.  ____________________________________________   FINAL CLINICAL IMPRESSION(S) / ED DIAGNOSES  Final diagnoses:   Bilateral lower extremity pain  ED Discharge Orders     None       *Please note:  Courtney Murphy was evaluated in Emergency Department on 08/20/2021 for the symptoms described in the history of present illness. She was evaluated in the context of the global COVID-19 pandemic, which necessitated consideration that the patient might be at risk for infection with the SARS-CoV-2 virus that causes COVID-19. Institutional protocols and algorithms that pertain to the evaluation of patients at risk for COVID-19 are in a state of rapid change based on information released by regulatory bodies including the CDC and federal and state organizations. These policies and algorithms were followed during the patient's care in the ED.  Some ED evaluations and interventions may be delayed as a result of limited staffing during and the pandemic.*   Note:  This document was prepared using Dragon voice recognition software and may include unintentional dictation errors.    Lindbergh Winkles, Delice Bison, DO 08/20/21 [phone]

## 2021-08-20 NOTE — Discharge Instructions (Addendum)
Your lab work today was reassuring.  Ultrasound of your leg showed no blood clots.  I recommend continued close outpatient follow-up with your primary care doctor and orthopedist for further evaluation, treatment of your bilateral leg pain.

## 2021-09-18 ENCOUNTER — Encounter: Payer: Self-pay | Admitting: Internal Medicine

## 2021-09-22 ENCOUNTER — Inpatient Hospital Stay: Payer: BC Managed Care – PPO | Admitting: Internal Medicine

## 2021-09-22 ENCOUNTER — Inpatient Hospital Stay: Payer: BC Managed Care – PPO

## 2021-09-29 ENCOUNTER — Encounter: Payer: Self-pay | Admitting: Internal Medicine

## 2021-09-29 ENCOUNTER — Other Ambulatory Visit: Payer: Self-pay | Admitting: *Deleted

## 2021-09-29 ENCOUNTER — Other Ambulatory Visit: Payer: Self-pay

## 2021-09-29 ENCOUNTER — Inpatient Hospital Stay: Payer: Medicare Other | Attending: Internal Medicine

## 2021-09-29 ENCOUNTER — Inpatient Hospital Stay: Payer: Medicare Other

## 2021-09-29 ENCOUNTER — Inpatient Hospital Stay (HOSPITAL_BASED_OUTPATIENT_CLINIC_OR_DEPARTMENT_OTHER): Payer: Medicare Other | Admitting: Internal Medicine

## 2021-09-29 DIAGNOSIS — R760 Raised antibody titer: Secondary | ICD-10-CM

## 2021-09-29 DIAGNOSIS — D509 Iron deficiency anemia, unspecified: Secondary | ICD-10-CM | POA: Insufficient documentation

## 2021-09-29 DIAGNOSIS — Z86711 Personal history of pulmonary embolism: Secondary | ICD-10-CM | POA: Diagnosis not present

## 2021-09-29 DIAGNOSIS — K909 Intestinal malabsorption, unspecified: Secondary | ICD-10-CM

## 2021-09-29 LAB — CBC WITH DIFFERENTIAL/PLATELET
Abs Immature Granulocytes: 0.05 10*3/uL (ref 0.00–0.07)
Basophils Absolute: 0.1 10*3/uL (ref 0.0–0.1)
Basophils Relative: 1 %
Eosinophils Absolute: 0.1 10*3/uL (ref 0.0–0.5)
Eosinophils Relative: 1 %
HCT: 38 % (ref 36.0–46.0)
Hemoglobin: 12.6 g/dL (ref 12.0–15.0)
Immature Granulocytes: 1 %
Lymphocytes Relative: 35 %
Lymphs Abs: 3.8 10*3/uL (ref 0.7–4.0)
MCH: 31 pg (ref 26.0–34.0)
MCHC: 33.2 g/dL (ref 30.0–36.0)
MCV: 93.6 fL (ref 80.0–100.0)
Monocytes Absolute: 0.6 10*3/uL (ref 0.1–1.0)
Monocytes Relative: 5 %
Neutro Abs: 6.3 10*3/uL (ref 1.7–7.7)
Neutrophils Relative %: 57 %
Platelets: 287 10*3/uL (ref 150–400)
RBC: 4.06 MIL/uL (ref 3.87–5.11)
RDW: 12.1 % (ref 11.5–15.5)
WBC: 10.9 10*3/uL — ABNORMAL HIGH (ref 4.0–10.5)
nRBC: 0 % (ref 0.0–0.2)

## 2021-09-29 LAB — BASIC METABOLIC PANEL
Anion gap: 12 (ref 5–15)
BUN: 18 mg/dL (ref 8–23)
CO2: 30 mmol/L (ref 22–32)
Calcium: 9.1 mg/dL (ref 8.9–10.3)
Chloride: 96 mmol/L — ABNORMAL LOW (ref 98–111)
Creatinine, Ser: 0.84 mg/dL (ref 0.44–1.00)
GFR, Estimated: >60 mL/min (ref >60)
Glucose, Bld: 215 mg/dL — ABNORMAL HIGH (ref 70–99)
Potassium: 3.8 mmol/L (ref 3.5–5.1)
Sodium: 138 mmol/L (ref 135–145)

## 2021-09-29 NOTE — Assessment & Plan Note (Signed)
#  Iron deficiency anemia [ gastric bypass-]-low iron saturation/ferritin.  S/p IV Venofer; hemoglobin is 12.5.  HOLD venofer today. Recommend barimelts.   # b12 def-continue home B12 injections.  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  Prior history of PE 10-15 years ago; currently not on  anticoagulation.  Not recommend any anticoagulation just based on abnormal serology-especially if lupus anticoagulant is negative.  Again reiterated my recommendations with patient in detail.   #Easy bruising dorsum of the hands-PT PTT-unremarkable.;  Do not suspect any platelet dysfunction.  However patient has been on steroids multiple times/which could potentially cause skin thinning.  Recommend moisturizing lotions avoiding trauma.  # DISPOSITION: # HOLD Venofer today # follow up in 6 months-MD; labs- cbc/bmp/iiton studies/ferritin- Nilda Riggs venofer-Dr.B

## 2021-09-29 NOTE — Progress Notes (Signed)
[facility] NOTE  Patient Care Team: Audelia Acton, MD as PCP - General (Internal Medicine)  CHIEF COMPLAINTS/PURPOSE OF CONSULTATION: DVT/PE  # PE [> 10-15 years] x 6 months of coumadin [3-4 months prior TAH]; No provoking causes; No Family Hx of blood clots; NO BCPs; no miscarriages;OCT 2021 [incidental rheumatologic work-up]-lupus anticoagulant-NEG; Beta2 IgG- 56 [H]; anticardiolipin-IgM -indeterminate [>13]; REPEAT APS WORK-UP JAN 2022-anticardiolipin IgG 55; rest of the work-up unremarkable-would not recommend anticoagulation  # Gastric By pass [lost 80 pounds; 2011]; JAN 2022-iron deficiency noted; IV Venofer  # Joint pains [Dr.Patel; [facility]] Oncology History  Antiphospholipid antibody positive     HISTORY OF PRESENTING ILLNESS: Alone ambulating independently.  Courtney Murphy 65 y.o.  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  Patient denies any nausea vomiting abdominal pain.  Any blood in stools or black-colored stools.  Patient complains of easy bruising especially in the dorsum of the hand.-Attributed to steroids.  Review of Systems  Constitutional:  Positive for malaise/fatigue. Negative for chills, diaphoresis, fever and weight loss.  HENT:  Negative for nosebleeds and sore throat.   Eyes:  Negative for double vision.  Respiratory:  Negative for cough, hemoptysis, sputum production, shortness of breath and wheezing.   Cardiovascular:  Negative for chest pain, palpitations, orthopnea and leg swelling.  Gastrointestinal:  Negative for abdominal pain, blood in stool, constipation, diarrhea, heartburn, melena, nausea and vomiting.  Genitourinary:  Negative for dysuria, frequency and urgency.  Musculoskeletal:  Positive for back pain and joint pain.  Skin: Negative.  Negative for itching and rash.  Neurological:   Negative for dizziness, tingling, focal weakness, weakness and headaches.  Endo/Heme/Allergies:  Bruises/bleeds easily.  Psychiatric/Behavioral:  Negative for depression. The patient is not nervous/anxious and does not have insomnia.     MEDICAL HISTORY:  Past Medical History:  Diagnosis Date   Acid reflux 10/11/2015   Anemia    RECEIVES IRON INFUSIONS   Arthritis    Asthma    WELL CONTROLLED   Depression    Diabetes mellitus without complication ([place])    Fatty liver    Gout    History of kidney stones    History of methicillin resistant staphylococcus aureus (MRSA) 2016   Hypertension    H/O BEEN OFF BP MEDS FOR 1 YEAR   Hypothyroidism    Kidney stone    Knee pain    Left   Pulmonary emboli ([place]) 2011   Restless leg syndrome    Shoulder pain     SURGICAL HISTORY: Past Surgical History:  Procedure Laterality Date   ABDOMINAL HYSTERECTOMY     BACK SURGERY     BREAST REDUCTION SURGERY     CATARACT EXTRACTION W/PHACO Left 07/28/2017   Procedure: CATARACT EXTRACTION PHACO AND INTRAOCULAR LENS PLACEMENT ([place]) diabetic Left;  Surgeon: Leandrew Koyanagi, MD;  Location: [place];  Service: Ophthalmology;  Laterality: Left;  Diabetic   CESAREAN SECTION  1990   CHOLECYSTECTOMY     CYSTOSCOPY/URETEROSCOPY/HOLMIUM LASER/STENT PLACEMENT Left 09/08/2017   Procedure: CYSTOSCOPY/URETEROSCOPY/HOLMIUM LASER/STENT EXCHANGE;  Surgeon: Hollice Espy, MD;  Location: [facility];  Service: Urology;  Laterality: Left;   CYSTOSCOPY/URETEROSCOPY/HOLMIUM LASER/STENT PLACEMENT Left 12/14/2018   Procedure: CYSTOSCOPY/URETEROSCOPY/HOLMIUM LASER/STENT PLACEMENT;  Surgeon: Hollice Espy, MD;  Location: [facility];  Service: Urology;  Laterality: Left;   FOOT SURGERY     GASTRIC BYPASS     IR NEPHROSTOMY  PLACEMENT LEFT  08/17/2017   KNEE ARTHROSCOPY Left    KNEE ARTHROSCOPY Left 07/19/2015   Procedure: ARTHROSCOPY KNEE;  Surgeon: Leanor Kail, MD;  Location: [facility];  Service:  Orthopedics;  Laterality: Left;   NEPHROLITHOTOMY Left 08/17/2017   Procedure: NEPHROLITHOTOMY PERCUTANEOUS WITH HOLMIUM LASER;  Surgeon: Hollice Espy, MD;  Location: [facility];  Service: Urology;  Laterality: Left;   THUMB ARTHROSCOPY     TONSILLECTOMY      SOCIAL HISTORY: Social History   Socioeconomic History   Marital status: Widowed    Spouse name: Not on file   Number of children: Not on file   Years of education: Not on file   Highest education level: Not on file  Occupational History   Not on file  Tobacco Use   Smoking status: Former    Packs/day: 0.50    Years: 4.00    Pack years: 2.00    Types: Cigarettes    Quit date: 04/14/2015    Years since quitting: 6.4   Smokeless tobacco: Never  Vaping Use   Vaping Use: Never used  Substance and Sexual Activity   Alcohol use: Not Currently    Comment: RARE   Drug use: No   Sexual activity: Never  Other Topics Concern   Not on file  Social History Narrative   Quit smoking > 30 years ago; rare alcohol. Used to worked at [employer]; receptionist at [employer]. Lives in [place]; with dog.    Social Determinants of Health   Financial Resource Strain: Not on file  Food Insecurity: Not on file  Transportation Needs: Not on file  Physical Activity: Not on file  Stress: Not on file  Social Connections: Not on file  Intimate Partner Violence: Not on file    FAMILY HISTORY: Family History  Problem Relation Age of Onset   Cancer Mother        breast   Heart disease Father    Diabetes Father    Hypertension Father    Cancer Maternal Grandmother    Cancer Maternal Grandfather    Heart disease Paternal Grandmother    Heart disease Paternal Grandfather    Cancer Maternal Aunt    Cancer Maternal Uncle    Heart disease Paternal Aunt    Heart disease Paternal Uncle    Kidney cancer Neg Hx    Bladder Cancer Neg Hx     ALLERGIES:  is allergic to sulfasalazine, nsaids, sulfa antibiotics, and  penicillins.  MEDICATIONS:  Current Outpatient Medications  Medication Sig Dispense Refill   atomoxetine (STRATTERA) 40 MG capsule      atorvastatin (LIPITOR) 20 MG tablet Take 20 mg by mouth every morning.      cyanocobalamin (,VITAMIN B-12,) 1000 MCG/ML injection Inject 1,000 mcg into the muscle once a week.     cyclobenzaprine (FLEXERIL) 10 MG tablet Take 10 mg by mouth every 8 (eight) hours.     Dulaglutide 0.75 MG/0.5ML SOPN Inject 0.75 mg into the skin every Thursday.      DULoxetine HCl 40 MG CPEP Take 1 capsule by mouth daily.     furosemide (LASIX) 20 MG tablet Take by mouth.     gabapentin (NEURONTIN) 300 MG capsule Take 300 mg by mouth at bedtime.     HYDROcodone-acetaminophen (NORCO/VICODIN) 5-325 MG tablet Take 1 tablet by mouth 3 (three) times daily as needed.     levothyroxine (SYNTHROID, LEVOTHROID) 100 MCG tablet Take 100 mcg by mouth daily before breakfast.  lisinopril (ZESTRIL) 20 MG tablet Take 20 mg by mouth daily.     meloxicam (MOBIC) 15 MG tablet Take 15 mg by mouth daily.     metFORMIN (GLUCOPHAGE-XR) 500 MG 24 hr tablet Take 500 mg by mouth daily.     metFORMIN (GLUCOPHAGE-XR) 750 MG 24 hr tablet Take 1,500 mg by mouth every morning.     methocarbamol (ROBAXIN) 500 MG tablet Take 500 mg by mouth 3 (three) times daily.     pramipexole (MIRAPEX) 0.5 MG tablet Take 0.5-1 mg by mouth at bedtime.      No current facility-administered medications for this visit.      Marland Kitchen  PHYSICAL EXAMINATION:  Vitals:   09/29/21 1432  BP: 115/73  Pulse: 96  Resp: 20  Temp: 97.8 F (36.6 C)  SpO2: 100%   Filed Weights   09/29/21 1432  Weight: 172 lb 9.6 oz (78.3 kg)    Physical Exam HENT:     Head: Normocephalic and atraumatic.     Mouth/Throat:     Pharynx: No oropharyngeal exudate.  Eyes:     Pupils: Pupils are equal, round, and reactive to light.  Cardiovascular:     Rate and Rhythm: Normal rate and regular rhythm.  Pulmonary:     Effort: Pulmonary effort  is normal. No respiratory distress.     Breath sounds: Normal breath sounds. No wheezing.  Abdominal:     General: Bowel sounds are normal. There is no distension.     Palpations: Abdomen is soft. There is no mass.     Tenderness: There is no abdominal tenderness. There is no guarding or rebound.  Musculoskeletal:        General: No tenderness. Normal range of motion.     Cervical back: Normal range of motion and neck supple.  Skin:    General: Skin is warm.     Comments: Chronic bruising noted on the upper extremities.  No bruises on the trunk or lower extremities.  Neurological:     Mental Status: She is alert and oriented to person, place, and time.  Psychiatric:        Mood and Affect: Affect normal.     LABORATORY DATA:  I have reviewed the data as listed Lab Results  Component Value Date   WBC 10.9 (H) 09/29/2021   HGB 12.6 09/29/2021   HCT 38.0 09/29/2021   MCV 93.6 09/29/2021   PLT 287 09/29/2021   Recent Labs    12/23/20 1107 05/26/21 0951 08/20/21 0016 09/29/21 1415  NA 137 134* 138 138  K 4.2 4.4 4.0 3.8  CL 100 95* 97* 96*  CO2 26 26 30 30   GLUCOSE 207* 272* 180* 215*  BUN 15 19 20 18   CREATININE 0.76 0.99 0.95 0.84  CALCIUM 8.9 9.2 9.3 9.1  GFRNONAA >60 >60 >60 >60  PROT 6.9  --   --   --   ALBUMIN 3.8  --   --   --   AST 16  --   --   --   ALT 20  --   --   --   ALKPHOS 107  --   --   --   BILITOT 0.2*  --   --   --     RADIOGRAPHIC STUDIES: I have personally reviewed the radiological images as listed and agreed with the findings in the report. No results found.   ASSESSMENT & PLAN:   Antiphospholipid antibody positive #Iron deficiency anemia [ gastric bypass-]-low  iron saturation/ferritin.  S/p IV Venofer; hemoglobin is 12.5.  HOLD venofer today. Recommend barimelts.   # b12 def-continue home B12 injections.  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  Prior history of PE 10-15  years ago; currently not on  anticoagulation.  Not recommend any anticoagulation just based on abnormal serology-especially if lupus anticoagulant is negative.  Again reiterated my recommendations with patient in detail.   #Easy bruising dorsum of the hands-PT PTT-unremarkable.;  Do not suspect any platelet dysfunction.  However patient has been on steroids multiple times/which could potentially cause skin thinning.  Recommend moisturizing lotions avoiding trauma.  # DISPOSITION: # HOLD Venofer today # follow up in 6 months-MD; labs- cbc/bmp/iiton studies/ferritin- Nilda Riggs venofer-Dr.B   All questions were answered. The patient knows to call the clinic with any problems, questions or concerns.   Cammie Sickle, MD 09/29/2021 3:13 PM

## 2021-10-16 HISTORY — PX: REPLACEMENT TOTAL KNEE: SUR1224

## 2021-12-03 ENCOUNTER — Ambulatory Visit: Payer: BC Managed Care – PPO | Admitting: Podiatry

## 2021-12-23 ENCOUNTER — Encounter: Payer: Self-pay | Admitting: Internal Medicine

## 2022-01-05 ENCOUNTER — Encounter: Payer: Self-pay | Admitting: Internal Medicine

## 2022-01-14 ENCOUNTER — Ambulatory Visit: Payer: Self-pay | Admitting: Urology

## 2022-01-16 ENCOUNTER — Other Ambulatory Visit: Payer: Self-pay | Admitting: *Deleted

## 2022-01-16 DIAGNOSIS — N200 Calculus of kidney: Secondary | ICD-10-CM

## 2022-01-18 ENCOUNTER — Other Ambulatory Visit: Payer: Self-pay

## 2022-01-18 ENCOUNTER — Emergency Department
Admission: EM | Admit: 2022-01-18 | Discharge: 2022-01-18 | Disposition: A | Discharge status: Home / Self Care | Payer: Medicare PPO | Attending: Emergency Medicine | Admitting: Emergency Medicine

## 2022-01-18 ENCOUNTER — Emergency Department: Payer: Medicare PPO

## 2022-01-18 DIAGNOSIS — M25512 Pain in left shoulder: Secondary | ICD-10-CM | POA: Diagnosis present

## 2022-01-18 DIAGNOSIS — M5412 Radiculopathy, cervical region: Secondary | ICD-10-CM | POA: Diagnosis not present

## 2022-01-18 LAB — CBC WITH DIFFERENTIAL/PLATELET
Abs Immature Granulocytes: 0.04 10*3/uL (ref 0.00–0.07)
Basophils Absolute: 0.1 10*3/uL (ref 0.0–0.1)
Basophils Relative: 1 %
Eosinophils Absolute: 0.1 10*3/uL (ref 0.0–0.5)
Eosinophils Relative: 1 %
HCT: 39.6 % (ref 36.0–46.0)
Hemoglobin: 12.4 g/dL (ref 12.0–15.0)
Immature Granulocytes: 0 %
Lymphocytes Relative: 38 %
Lymphs Abs: 4.6 10*3/uL — ABNORMAL HIGH (ref 0.7–4.0)
MCH: 28.8 pg (ref 26.0–34.0)
MCHC: 31.3 g/dL (ref 30.0–36.0)
MCV: 92.1 fL (ref 80.0–100.0)
Monocytes Absolute: 0.7 10*3/uL (ref 0.1–1.0)
Monocytes Relative: 5 %
Neutro Abs: 6.8 10*3/uL (ref 1.7–7.7)
Neutrophils Relative %: 55 %
Platelets: 325 10*3/uL (ref 150–400)
RBC: 4.3 MIL/uL (ref 3.87–5.11)
RDW: 13.9 % (ref 11.5–15.5)
WBC: 12.3 10*3/uL — ABNORMAL HIGH (ref 4.0–10.5)
nRBC: 0 % (ref 0.0–0.2)

## 2022-01-18 LAB — COMPREHENSIVE METABOLIC PANEL
ALT: 18 U/L (ref 0–44)
AST: 17 U/L (ref 15–41)
Albumin: 3.8 g/dL (ref 3.5–5.0)
Alkaline Phosphatase: 122 U/L (ref 38–126)
Anion gap: 10 (ref 5–15)
BUN: 20 mg/dL (ref 8–23)
CO2: 26 mmol/L (ref 22–32)
Calcium: 9.4 mg/dL (ref 8.9–10.3)
Chloride: 101 mmol/L (ref 98–111)
Creatinine, Ser: 0.87 mg/dL (ref 0.44–1.00)
GFR, Estimated: >60 mL/min (ref >60)
Glucose, Bld: 207 mg/dL — ABNORMAL HIGH (ref 70–99)
Potassium: 3.7 mmol/L (ref 3.5–5.1)
Sodium: 137 mmol/L (ref 135–145)
Total Bilirubin: 0.3 mg/dL (ref 0.3–1.2)
Total Protein: 7 g/dL (ref 6.5–8.1)

## 2022-01-18 LAB — TROPONIN I (HIGH SENSITIVITY): Troponin I (High Sensitivity): 5 ng/L (ref <18)

## 2022-01-18 NOTE — ED Provider Notes (Signed)
[facility] Provider Note    Event Date/Time   First MD Initiated Contact with Patient 01/18/22 [phone]     (approximate)   History   left arm pain   HPI  Courtney Murphy is a 66 y.o. female  who, per clinic visit dated 01/16/22 was recently seen for sore throat and started on steroid, presents to the emergency department today because of concern for left shoulder pain.  The patient states pain has been present for roughly 1 week.  She does not recall any trauma or overuse of that arm prior to the pain starting.  The patient states she does have history of back issues.  Due to this she does take gabapentin but it has not helped this pain.    Physical Exam   Triage Vital Signs: ED Triage Vitals [01/18/22 0321]  Enc Vitals Group     BP (!) 143/82     Pulse Rate 86     Resp 16     Temp 97.8 F (36.6 C)     Temp Source Oral     SpO2 99 %     Weight 166 lb (75.3 kg)     Height 5\' 4"  (1.626 m)     Head Circumference      Peak Flow      Pain Score 7     Pain Loc      Pain Edu?      Excl. in [place]?     Most recent vital signs: Vitals:   01/18/22 0321  BP: (!) 143/82  Pulse: 86  Resp: 16  Temp: 97.8 F (36.6 C)  SpO2: 99%    General: Awake, no distress.  CV:  Good peripheral perfusion.  Resp:  Normal effort.  Abd:  No distention.  MSK:  No deformity to left upper extremity. NV intact distally.   ED Results / Procedures / Treatments   Labs (all labs ordered are listed, but only abnormal results are displayed) Labs Reviewed  CBC WITH DIFFERENTIAL/PLATELET - Abnormal; Notable for the following components:      Result Value   WBC 12.3 (*)    Lymphs Abs 4.6 (*)    All other components within normal limits  COMPREHENSIVE METABOLIC PANEL - Abnormal; Notable for the following components:   Glucose, Bld 207 (*)    All other components within normal limits  TROPONIN I (HIGH SENSITIVITY)  TROPONIN I (HIGH SENSITIVITY)     EKG  I, Nance Pear, attending physician, personally viewed and interpreted this EKG  EKG Time: 0324 Rate: 92 Rhythm: normal sinus rhythm Axis: normal Intervals: qtc 440 QRS: narrow ST changes: no st elevation Impression: normal ekg  RADIOLOGY CT cervical spine I independently interpreted and visualized the ct cervical spine CT. My interpretation: No acute osseous abnormality Radiology interpretation: IMPRESSION:  1. No evidence for acute fracture or subluxation.  2. 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  3. First degree anterolisthesis of C7 on T1 with bilateral facet  arthropathy and bilateral neural foraminal stenosis.   Left shoulder x-ray I independently interpreted and visualized the left shoulder x-ray. My interpretation: No fracture, no dislocation Radiology interpretation: IMPRESSION:  No acute osseous abnormality identified. Progressed inferior glenoid  degeneration since 2021.       PROCEDURES:  Critical Care performed: No  Procedures   MEDICATIONS ORDERED IN ED: Medications - No data to display   IMPRESSION / MDM / [place] / ED COURSE  I  reviewed the triage vital signs and the nursing notes.                              Differential diagnosis includes, but is not limited to, fracture, dislocation, soft tissue injury radiculopathy.   Patient presented to the emergency department today because of concern for left upper extremity pain. X-ray did not show any acute abnormality. At this time do think it is likely due to radiculopathy. Patient is already on medications to help with this including gabapentin, steroids. She has outpatient prescription for narcotics. Did discuss radiculopathy with patient.    FINAL CLINICAL IMPRESSION(S) / ED DIAGNOSES   Final diagnoses:  Cervical radiculopathy       Note:  This document was prepared using Dragon voice recognition software and may include unintentional dictation errors.    Nance Pear,  MD 01/18/22 [phone]

## 2022-01-18 NOTE — ED Triage Notes (Signed)
First Note: pt to ED via POV with c/o neck pain that radiates to L shoulder, has been ongoing x 1 week. Pt A&O x 4, ambulatory without difficulty at this time.

## 2022-01-18 NOTE — ED Notes (Signed)
In room to DC pt. Pt not found in room or dept.

## 2022-01-18 NOTE — ED Triage Notes (Signed)
Pt states pain in left arm that radiates into neck. Pt denies chest pain, dizziness, shob. Pt states has been present for several days. Pt appears in no acute distress.

## 2022-01-18 NOTE — Discharge Instructions (Signed)
Please seek medical attention for any high fevers, chest pain, shortness of breath, change in behavior, persistent vomiting, bloody stool or any other new or concerning symptoms.  

## 2022-01-19 NOTE — Progress Notes (Incomplete)
01/19/22 8:02 AM   Courtney Murphy 05/18/1956 [id number]  Referring provider:  Audelia Acton, MD No address on file No chief complaint on file.    HPI: Courtney Murphy is a 66 y.o.female with a personal history of nephrolithiasis who returns today for routine annual follow-up with KUB.   She did have a metabolic work-up in 0998 which showed hypercalciuria and low volume.  She is been managed with dietary modification.   She does have an extensive history of kidney stones and requiring PCNL multiple ureteroscopy, most recently in 11/2018.  KUB today ***  She reports ***      PMH: Past Medical History:  Diagnosis Date   Acid reflux 10/11/2015   Anemia    RECEIVES IRON INFUSIONS   Arthritis    Asthma    WELL CONTROLLED   Depression    Diabetes mellitus without complication ([facility])    Fatty liver    Gout    History of kidney stones    History of methicillin resistant staphylococcus aureus (MRSA) 2016   Hypertension    H/O BEEN OFF BP MEDS FOR 1 YEAR   Hypothyroidism    Kidney stone    Knee pain    Left   Pulmonary emboli ([place]) 2011   Restless leg syndrome    Shoulder pain     Surgical History: Past Surgical History:  Procedure Laterality Date   ABDOMINAL HYSTERECTOMY     BACK SURGERY     BREAST REDUCTION SURGERY     CATARACT EXTRACTION W/PHACO Left 07/28/2017   Procedure: CATARACT EXTRACTION PHACO AND INTRAOCULAR LENS PLACEMENT ([place]) diabetic Left;  Surgeon: Leandrew Koyanagi, MD;  Location: [place] [facility];  Service: Ophthalmology;  Laterality: Left;  Diabetic   CESAREAN SECTION  1990   CHOLECYSTECTOMY     CYSTOSCOPY/URETEROSCOPY/HOLMIUM LASER/STENT PLACEMENT Left 09/08/2017   Procedure: CYSTOSCOPY/URETEROSCOPY/HOLMIUM LASER/STENT EXCHANGE;  Surgeon: Hollice Espy, MD;  Location: [facility];  Service: Urology;  Laterality: Left;   CYSTOSCOPY/URETEROSCOPY/HOLMIUM LASER/STENT PLACEMENT Left 12/14/2018   Procedure: CYSTOSCOPY/URETEROSCOPY/HOLMIUM  LASER/STENT PLACEMENT;  Surgeon: Hollice Espy, MD;  Location: [facility];  Service: Urology;  Laterality: Left;   FOOT SURGERY     GASTRIC BYPASS     IR NEPHROSTOMY PLACEMENT LEFT  08/17/2017   KNEE ARTHROSCOPY Left    KNEE ARTHROSCOPY Left 07/19/2015   Procedure: ARTHROSCOPY KNEE;  Surgeon: Leanor Kail, MD;  Location: [facility];  Service: Orthopedics;  Laterality: Left;   NEPHROLITHOTOMY Left 08/17/2017   Procedure: NEPHROLITHOTOMY PERCUTANEOUS WITH HOLMIUM LASER;  Surgeon: Hollice Espy, MD;  Location: [facility];  Service: Urology;  Laterality: Left;   THUMB ARTHROSCOPY     TONSILLECTOMY      Home Medications:  Allergies as of 01/20/2022       Reactions   Sulfasalazine Hives   Nsaids    Avoids because of gastric bypass   Sulfa Antibiotics Hives   Penicillins Hives, Rash   Has patient had a PCN reaction causing immediate rash, facial/tongue/throat swelling, SOB or lightheadedness with hypotension: No Has patient had a PCN reaction causing severe rash involving mucus membranes or skin necrosis: No Has patient had a PCN reaction that required hospitalization: No Has patient had a PCN reaction occurring within the last 10 years: No If all of the above answers are "NO", then may proceed with Cephalosporin use.        Medication List        Accurate as of January 19, 2022  8:02 AM. If you have any  questions, ask your nurse or doctor.          atomoxetine 40 MG capsule Commonly known as: STRATTERA   atorvastatin 20 MG tablet Commonly known as: LIPITOR Take 20 mg by mouth every morning.   cyanocobalamin 1000 MCG/ML injection Commonly known as: (VITAMIN B-12) Inject 1,000 mcg into the muscle once a week.   cyclobenzaprine 10 MG tablet Commonly known as: FLEXERIL Take 10 mg by mouth every 8 (eight) hours.   Dulaglutide 0.75 MG/0.5ML Sopn Inject 0.75 mg into the skin every Thursday.   DULoxetine HCl 40 MG Cpep Take 1 capsule by mouth daily.   furosemide 20 MG  tablet Commonly known as: LASIX Take by mouth.   gabapentin 300 MG capsule Commonly known as: NEURONTIN Take 300 mg by mouth at bedtime.   HYDROcodone-acetaminophen 5-325 MG tablet Commonly known as: NORCO/VICODIN Take 1 tablet by mouth 3 (three) times daily as needed.   levothyroxine 100 MCG tablet Commonly known as: SYNTHROID Take 100 mcg by mouth daily before breakfast.   lisinopril 20 MG tablet Commonly known as: ZESTRIL Take 20 mg by mouth daily.   meloxicam 15 MG tablet Commonly known as: MOBIC Take 15 mg by mouth daily.   metFORMIN 500 MG 24 hr tablet Commonly known as: GLUCOPHAGE-XR Take 500 mg by mouth daily.   metFORMIN 750 MG 24 hr tablet Commonly known as: GLUCOPHAGE-XR Take 1,500 mg by mouth every morning.   methocarbamol 500 MG tablet Commonly known as: ROBAXIN Take 500 mg by mouth 3 (three) times daily.   pramipexole 0.5 MG tablet Commonly known as: MIRAPEX Take 0.5-1 mg by mouth at bedtime.        Allergies:  Allergies  Allergen Reactions   Sulfasalazine Hives   Nsaids     Avoids because of gastric bypass   Sulfa Antibiotics Hives   Penicillins Hives and Rash    Has patient had a PCN reaction causing immediate rash, facial/tongue/throat swelling, SOB or lightheadedness with hypotension: No Has patient had a PCN reaction causing severe rash involving mucus membranes or skin necrosis: No Has patient had a PCN reaction that required hospitalization: No Has patient had a PCN reaction occurring within the last 10 years: No If all of the above answers are "NO", then may proceed with Cephalosporin use.     Family History: Family History  Problem Relation Age of Onset   Cancer Mother        breast   Heart disease Father    Diabetes Father    Hypertension Father    Cancer Maternal Grandmother    Cancer Maternal Grandfather    Heart disease Paternal Grandmother    Heart disease Paternal Grandfather    Cancer Maternal Aunt    Cancer  Maternal Uncle    Heart disease Paternal Aunt    Heart disease Paternal Uncle    Kidney cancer Neg Hx    Bladder Cancer Neg Hx     Social History:  reports that she quit smoking about 6 years ago. Her smoking use included cigarettes. She has a 2.00 pack-year smoking history. She has never used smokeless tobacco. She reports that she does not currently use alcohol. She reports that she does not use drugs.   Physical Exam: There were no vitals taken for this visit.  Constitutional:  Alert and oriented, No acute distress. HEENT: [place] AT, moist mucus membranes.  Trachea midline, no masses. Cardiovascular: No clubbing, cyanosis, or edema. Respiratory: Normal respiratory effort, no increased work of breathing.  Skin: No rashes, bruises or suspicious lesions. Neurologic: Grossly intact, no focal deficits, moving all 4 extremities. Psychiatric: Normal mood and affect.  Laboratory Data:  Lab Results  Component Value Date   CREATININE 0.87 01/18/2022    Lab Results  Component Value Date   HGBA1C 8.8 (H) 08/11/2019    Urinalysis   Pertinent Imaging:    Assessment & Plan:     No follow-ups on file.  I,Kailey Littlejohn,acting as a Education administrator for Hollice Espy, MD.,have documented all relevant documentation on the behalf of Hollice Espy, MD,as directed by  Hollice Espy, MD while in the presence of Hollice Espy, [place] [address] [place], [place] [address] [phone]

## 2022-01-20 ENCOUNTER — Ambulatory Visit: Payer: BC Managed Care – PPO | Admitting: Urology

## 2022-01-22 NOTE — Progress Notes (Incomplete)
01/22/22 4:03 PM   Courtney Murphy 1956/04/20 [id number]  Referring provider:  Audelia Acton, MD No address on file No chief complaint on file.    HPI: Courtney Murphy is a 66 y.o.female with a personal history of nephrolithiasis returns today for routine annual follow-up with KUB.  She did have a metabolic work-up in 3151 which showed hypercalciuria and low volume.  She is been managed with dietary modification.   She does have an extensive history of kidney stones and requiring PCNL multiple ureteroscopy, most recently in 11/2018.  No cross-sectional imaging over the past year.    PMH: Past Medical History:  Diagnosis Date   Acid reflux 10/11/2015   Anemia    RECEIVES IRON INFUSIONS   Arthritis    Asthma    WELL CONTROLLED   Depression    Diabetes mellitus without complication ([place])    Fatty liver    Gout    History of kidney stones    History of methicillin resistant staphylococcus aureus (MRSA) 2016   Hypertension    H/O BEEN OFF BP MEDS FOR 1 YEAR   Hypothyroidism    Kidney stone    Knee pain    Left   Pulmonary emboli ([place]) 2011   Restless leg syndrome    Shoulder pain     Surgical History: Past Surgical History:  Procedure Laterality Date   ABDOMINAL HYSTERECTOMY     BACK SURGERY     BREAST REDUCTION SURGERY     CATARACT EXTRACTION W/PHACO Left 07/28/2017   Procedure: CATARACT EXTRACTION PHACO AND INTRAOCULAR LENS PLACEMENT ([place]) diabetic Left;  Surgeon: Leandrew Koyanagi, MD;  Location: [facility];  Service: Ophthalmology;  Laterality: Left;  Diabetic   CESAREAN SECTION  1990   CHOLECYSTECTOMY     CYSTOSCOPY/URETEROSCOPY/HOLMIUM LASER/STENT PLACEMENT Left 09/08/2017   Procedure: CYSTOSCOPY/URETEROSCOPY/HOLMIUM LASER/STENT EXCHANGE;  Surgeon: Hollice Espy, MD;  Location: [facility];  Service: Urology;  Laterality: Left;   CYSTOSCOPY/URETEROSCOPY/HOLMIUM LASER/STENT PLACEMENT Left 12/14/2018   Procedure:  CYSTOSCOPY/URETEROSCOPY/HOLMIUM LASER/STENT PLACEMENT;  Surgeon: Hollice Espy, MD;  Location: [facility];  Service: Urology;  Laterality: Left;   FOOT SURGERY     GASTRIC BYPASS     IR NEPHROSTOMY PLACEMENT LEFT  08/17/2017   KNEE ARTHROSCOPY Left    KNEE ARTHROSCOPY Left 07/19/2015   Procedure: ARTHROSCOPY KNEE;  Surgeon: Leanor Kail, MD;  Location: [facility];  Service: Orthopedics;  Laterality: Left;   NEPHROLITHOTOMY Left 08/17/2017   Procedure: NEPHROLITHOTOMY PERCUTANEOUS WITH HOLMIUM LASER;  Surgeon: Hollice Espy, MD;  Location: [facility];  Service: Urology;  Laterality: Left;   THUMB ARTHROSCOPY     TONSILLECTOMY      Home Medications:  Allergies as of 01/23/2022       Reactions   Sulfasalazine Hives   Nsaids    Avoids because of gastric bypass   Sulfa Antibiotics Hives   Penicillins Hives, Rash   Has patient had a PCN reaction causing immediate rash, facial/tongue/throat swelling, SOB or lightheadedness with hypotension: No Has patient had a PCN reaction causing severe rash involving mucus membranes or skin necrosis: No Has patient had a PCN reaction that required hospitalization: No Has patient had a PCN reaction occurring within the last 10 years: No If all of the above answers are "NO", then may proceed with Cephalosporin use.        Medication List        Accurate as of January 22, 2022  4:03 PM. If you have any questions, ask your nurse  or doctor.          atomoxetine 40 MG capsule Commonly known as: STRATTERA   atorvastatin 20 MG tablet Commonly known as: LIPITOR Take 20 mg by mouth every morning.   cyanocobalamin 1000 MCG/ML injection Commonly known as: (VITAMIN B-12) Inject 1,000 mcg into the muscle once a week.   cyclobenzaprine 10 MG tablet Commonly known as: FLEXERIL Take 10 mg by mouth every 8 (eight) hours.   Dulaglutide 0.75 MG/0.5ML Sopn Inject 0.75 mg into the skin every Thursday.   DULoxetine HCl 40 MG Cpep Take 1 capsule by  mouth daily.   furosemide 20 MG tablet Commonly known as: LASIX Take by mouth.   gabapentin 300 MG capsule Commonly known as: NEURONTIN Take 300 mg by mouth at bedtime.   HYDROcodone-acetaminophen 5-325 MG tablet Commonly known as: NORCO/VICODIN Take 1 tablet by mouth 3 (three) times daily as needed.   levothyroxine 100 MCG tablet Commonly known as: SYNTHROID Take 100 mcg by mouth daily before breakfast.   lisinopril 20 MG tablet Commonly known as: ZESTRIL Take 20 mg by mouth daily.   meloxicam 15 MG tablet Commonly known as: MOBIC Take 15 mg by mouth daily.   metFORMIN 500 MG 24 hr tablet Commonly known as: GLUCOPHAGE-XR Take 500 mg by mouth daily.   metFORMIN 750 MG 24 hr tablet Commonly known as: GLUCOPHAGE-XR Take 1,500 mg by mouth every morning.   methocarbamol 500 MG tablet Commonly known as: ROBAXIN Take 500 mg by mouth 3 (three) times daily.   pramipexole 0.5 MG tablet Commonly known as: MIRAPEX Take 0.5-1 mg by mouth at bedtime.        Allergies:  Allergies  Allergen Reactions   Sulfasalazine Hives   Nsaids     Avoids because of gastric bypass   Sulfa Antibiotics Hives   Penicillins Hives and Rash    Has patient had a PCN reaction causing immediate rash, facial/tongue/throat swelling, SOB or lightheadedness with hypotension: No Has patient had a PCN reaction causing severe rash involving mucus membranes or skin necrosis: No Has patient had a PCN reaction that required hospitalization: No Has patient had a PCN reaction occurring within the last 10 years: No If all of the above answers are "NO", then may proceed with Cephalosporin use.     Family History: Family History  Problem Relation Age of Onset   Cancer Mother        breast   Heart disease Father    Diabetes Father    Hypertension Father    Cancer Maternal Grandmother    Cancer Maternal Grandfather    Heart disease Paternal Grandmother    Heart disease Paternal Grandfather     Cancer Maternal Aunt    Cancer Maternal Uncle    Heart disease Paternal Aunt    Heart disease Paternal Uncle    Kidney cancer Neg Hx    Bladder Cancer Neg Hx     Social History:  reports that she quit smoking about 6 years ago. Her smoking use included cigarettes. She has a 2.00 pack-year smoking history. She has never used smokeless tobacco. She reports that she does not currently use alcohol. She reports that she does not use drugs.   Physical Exam: There were no vitals taken for this visit.  Constitutional:  Alert and oriented, No acute distress. HEENT: [employer] AT, moist mucus membranes.  Trachea midline, no masses. Cardiovascular: No clubbing, cyanosis, or edema. Respiratory: Normal respiratory effort, no increased work of breathing. Skin: No rashes, bruises  or suspicious lesions. Neurologic: Grossly intact, no focal deficits, moving all 4 extremities. Psychiatric: Normal mood and affect.  Laboratory Data:  Lab Results  Component Value Date   CREATININE 0.87 01/18/2022   Lab Results  Component Value Date   HGBA1C 8.8 (H) 08/11/2019    Urinalysis   Pertinent Imaging:   Assessment & Plan:     No follow-ups on file.  I,Kailey Littlejohn,acting as a Education administrator for Hollice Espy, MD.,have documented all relevant documentation on the behalf of Hollice Espy, MD,as directed by  Hollice Espy, MD while in the presence of Hollice Espy, [place] [address] [place], [place] [address] [phone]

## 2022-01-23 ENCOUNTER — Ambulatory Visit: Payer: Self-pay | Admitting: Urology

## 2022-01-25 ENCOUNTER — Other Ambulatory Visit: Payer: Self-pay

## 2022-01-25 ENCOUNTER — Encounter: Payer: Self-pay | Admitting: Emergency Medicine

## 2022-01-25 ENCOUNTER — Emergency Department: Payer: Medicare PPO

## 2022-01-25 ENCOUNTER — Emergency Department
Admission: EM | Admit: 2022-01-25 | Discharge: 2022-01-25 | Disposition: A | Discharge status: Home / Self Care | Payer: Medicare PPO | Attending: Student in an Organized Health Care Education/Training Program | Admitting: Student in an Organized Health Care Education/Training Program

## 2022-01-25 DIAGNOSIS — Y9301 Activity, walking, marching and hiking: Secondary | ICD-10-CM | POA: Diagnosis not present

## 2022-01-25 DIAGNOSIS — E119 Type 2 diabetes mellitus without complications: Secondary | ICD-10-CM | POA: Insufficient documentation

## 2022-01-25 DIAGNOSIS — I10 Essential (primary) hypertension: Secondary | ICD-10-CM | POA: Diagnosis not present

## 2022-01-25 DIAGNOSIS — R531 Weakness: Secondary | ICD-10-CM | POA: Diagnosis not present

## 2022-01-25 DIAGNOSIS — W01198A Fall on same level from slipping, tripping and stumbling with subsequent striking against other object, initial encounter: Secondary | ICD-10-CM | POA: Insufficient documentation

## 2022-01-25 DIAGNOSIS — S0990XA Unspecified injury of head, initial encounter: Secondary | ICD-10-CM | POA: Diagnosis not present

## 2022-01-25 DIAGNOSIS — J45909 Unspecified asthma, uncomplicated: Secondary | ICD-10-CM | POA: Insufficient documentation

## 2022-01-25 DIAGNOSIS — R42 Dizziness and giddiness: Secondary | ICD-10-CM | POA: Insufficient documentation

## 2022-01-25 DIAGNOSIS — W19XXXA Unspecified fall, initial encounter: Secondary | ICD-10-CM

## 2022-01-25 LAB — URINALYSIS, ROUTINE W REFLEX MICROSCOPIC
Bacteria, UA: NONE SEEN
Bilirubin Urine: NEGATIVE
Glucose, UA: NEGATIVE mg/dL
Hgb urine dipstick: NEGATIVE
Ketones, ur: NEGATIVE mg/dL
Nitrite: NEGATIVE
Protein, ur: NEGATIVE mg/dL
Specific Gravity, Urine: 1.017 (ref 1.005–1.030)
pH: 5 (ref 5.0–8.0)

## 2022-01-25 LAB — CK: Total CK: 84 U/L (ref 38–234)

## 2022-01-25 LAB — BASIC METABOLIC PANEL
Anion gap: 8 (ref 5–15)
BUN: 29 mg/dL — ABNORMAL HIGH (ref 8–23)
CO2: 29 mmol/L (ref 22–32)
Calcium: 8.8 mg/dL — ABNORMAL LOW (ref 8.9–10.3)
Chloride: 100 mmol/L (ref 98–111)
Creatinine, Ser: 0.9 mg/dL (ref 0.44–1.00)
GFR, Estimated: >60 mL/min (ref >60)
Glucose, Bld: 186 mg/dL — ABNORMAL HIGH (ref 70–99)
Potassium: 3.9 mmol/L (ref 3.5–5.1)
Sodium: 137 mmol/L (ref 135–145)

## 2022-01-25 LAB — TROPONIN I (HIGH SENSITIVITY)
Troponin I (High Sensitivity): 4 ng/L (ref <18)
Troponin I (High Sensitivity): 4 ng/L (ref <18)

## 2022-01-25 LAB — CBC
HCT: 36.6 % (ref 36.0–46.0)
Hemoglobin: 11.6 g/dL — ABNORMAL LOW (ref 12.0–15.0)
MCH: 29.1 pg (ref 26.0–34.0)
MCHC: 31.7 g/dL (ref 30.0–36.0)
MCV: 91.7 fL (ref 80.0–100.0)
Platelets: 261 10*3/uL (ref 150–400)
RBC: 3.99 MIL/uL (ref 3.87–5.11)
RDW: 14.2 % (ref 11.5–15.5)
WBC: 10.1 10*3/uL (ref 4.0–10.5)
nRBC: 0 % (ref 0.0–0.2)

## 2022-01-25 LAB — CBG MONITORING, ED: Glucose-Capillary: 118 mg/dL — ABNORMAL HIGH (ref 70–99)

## 2022-01-25 NOTE — ED Triage Notes (Signed)
Pt via POV from home. Pt c/o dizziness and fell this AM and hit her head. States she thinks it is her medication. States she think she was in the floor a little over an hour but states she does not know how long she was out. Pt c/o head pain on the L side. Pt states her entire L side is hurting. Denies blood thinners. Pt is A&OX4 and NAD

## 2022-01-25 NOTE — ED Provider Notes (Signed)
[facility] Provider Note    Event Date/Time   First MD Initiated Contact with Patient 01/25/22 [phone]     (approximate)   History   Fall and Head Injury   HPI  Courtney Murphy is a 66 y.o. female with a history of asthma diabetes hypertension smoking history remote history of PE presents to the ER for evaluation of weakness and dizziness.  States that last night she was having trouble grabbing coffee cup with her right hand states that she dropped it several times.  States she got up walked down the hall and fell into the wall.  States that she did hit her head cannot member she lost just nose states that she was down on the ground about an hour.  Was able to get back up.  She thinks that is related to her gabapentin she is also taking Mirapex and says that she feels weak and dizzy when taking both of those together.  She denies any numbness or tingling right now.  Has a mild headache.  No chest pain or shortness of breath.  No abdominal pain.  No lower extremity pain.      Physical Exam   Triage Vital Signs: ED Triage Vitals [01/25/22 0707]  Enc Vitals Group     BP 119/75     Pulse Rate 83     Resp 16     Temp 97.6 F (36.4 C)     Temp Source Oral     SpO2 99 %     Weight 166 lb (75.3 kg)     Height 5\' 4"  (1.626 m)     Head Circumference      Peak Flow      Pain Score 6     Pain Loc      Pain Edu?      Excl. in [place]?     Most recent vital signs: Vitals:   01/25/22 0707  BP: 119/75  Pulse: 83  Resp: 16  Temp: 97.6 F (36.4 C)  SpO2: 99%     Constitutional: Alert  Eyes: Conjunctivae are normal.  Head: Atraumatic. Nose: No congestion/rhinnorhea. Mouth/Throat: Mucous membranes are moist.   Neck: Painless ROM.  Cardiovascular:   Good peripheral circulation. Respiratory: Normal respiratory effort.  No retractions.  Gastrointestinal: Soft and nontender.  Musculoskeletal:  no deformity Neurologic:  CN- intact.  No facial droop, Normal  FNF.  Normal heel to shin.  Sensation intact bilaterally. Normal speech and language. No gross focal neurologic deficits are appreciated. No gait instability. Skin:  Skin is warm, dry and intact. No rash noted. Psychiatric: Mood and affect are normal. Speech and behavior are normal.    ED Results / Procedures / Treatments   Labs (all labs ordered are listed, but only abnormal results are displayed) Labs Reviewed  BASIC METABOLIC PANEL - Abnormal; Notable for the following components:      Result Value   Glucose, Bld 186 (*)    BUN 29 (*)    Calcium 8.8 (*)    All other components within normal limits  CBC - Abnormal; Notable for the following components:   Hemoglobin 11.6 (*)    All other components within normal limits  URINALYSIS, ROUTINE W REFLEX MICROSCOPIC - Abnormal; Notable for the following components:   Color, Urine YELLOW (*)    APPearance HAZY (*)    Leukocytes,Ua SMALL (*)    All other components within normal limits  CBG MONITORING, ED - Abnormal; Notable  for the following components:   Glucose-Capillary 118 (*)    All other components within normal limits  CK  TROPONIN I (HIGH SENSITIVITY)  TROPONIN I (HIGH SENSITIVITY)     EKG  ED ECG REPORT I, Merlyn Lot, the attending physician, personally viewed and interpreted this ECG.   Date: 01/25/2022  EKG Time: 7:10  Rate: 90  Rhythm: sinus  Axis: normal  Intervals:normal   ST&T Change: no stemi, no depressions    RADIOLOGY Please see ED Course for my review and interpretation.  I personally reviewed all radiographic images ordered to evaluate for the above acute complaints and reviewed radiology reports and findings.  These findings were personally discussed with the patient.  Please see medical record for radiology report.    PROCEDURES:  Critical Care performed: No  Procedures   MEDICATIONS ORDERED IN ED: Medications - No data to display   IMPRESSION / MDM / [place] / ED  COURSE  I reviewed the triage vital signs and the nursing notes.                              Differential diagnosis includes, but is not limited to, cva, tia, hypoglycemia, dehydration, electrolyte abnormality, dissection, sepsis  Patient presenting the ER with symptoms as described above.  She is currently well-appearing dynamically stable in no acute distress.  Findings are concerning for TIA versus CVA.  A lower suspicion for seizure.  Lower suspicion for cardiac etiology though will order cardiac enzymes and placed on cardiac monitor.  No symptoms or history to suggest sepsis.  Possible dehydration will give some IV fluids.  Does not seem consistent with PE or dissection.  Clinical Course as of 01/25/22 1141  Sun Jan 25, 2022  1030 CT head by my review does not show evidence of subdural or IPH.  MRI ordered and does not show any evidence of acute intracranial abnormality. [PR]  3094 Patient reassessed.  Discussed results of imaging and reassuring blood work.  Patient feels well.  She thinks that this is related to her gabapentin and would like to discuss with her primary care doctor.  We discussed option for observation the hospital patient feels well at this point and would prefer outpatient follow-up which given her reassuring work-up I think is reasonable. [PR]    Clinical Course User Index [PR] Merlyn Lot, MD     FINAL CLINICAL IMPRESSION(S) / ED DIAGNOSES   Final diagnoses:  Fall, initial encounter  Dizziness     Rx / DC Orders   ED Discharge Orders     None        Note:  This document was prepared using Dragon voice recognition software and may include unintentional dictation errors.    Merlyn Lot, MD 01/25/22 1141

## 2022-01-25 NOTE — ED Triage Notes (Addendum)
FIRST NURSE NOTE:  Pt arrived via POV with reports of fall striking the L side of her head.  Unsure if she had any LOC, laid on floor for over an hour. Pt not currently on any blood thinners. Pt ambulatory in lobby without difficulty.

## 2022-02-04 ENCOUNTER — Ambulatory Visit
Admission: RE | Admit: 2022-02-04 | Discharge: 2022-02-04 | Disposition: A | Discharge status: Home / Self Care | Payer: Medicare PPO | Source: Ambulatory Visit | Attending: Urology | Admitting: Urology

## 2022-02-04 DIAGNOSIS — N200 Calculus of kidney: Secondary | ICD-10-CM

## 2022-02-05 NOTE — Progress Notes (Incomplete)
02/05/22 2:20 PM   Courtney Murphy 16-Nov-1956 [id number]  Referring provider:  Audelia Acton, MD No address on file No chief complaint on file.    HPI: Courtney Murphy is a 66 y.o.female with a personal history of recurrent nephrolithiasis, left nephrolithiasis, and pyuria, who presents today for a 1 year follow-up with KUB prior.   On her KUB 2021, she did have small stones in her left kidney up to 5 mm.   She had a metabolic work-up in 7893 which showed hypercalciuria and low volume.  She is been managed with dietary modification.   She has an extensive history of kidney stones and requiring PCNL multiple ureteroscopy, most recently in 11/2018.  KUB on 02/04/2022 visualized small calcific densities overlying the left kidney, similar to the nonobstructing stone seen within the lower pole on 08/11/2019 CT.    PMH: Past Medical History:  Diagnosis Date   Acid reflux 10/11/2015   Anemia    RECEIVES IRON INFUSIONS   Arthritis    Asthma    WELL CONTROLLED   Depression    Diabetes mellitus without complication ([place])    Fatty liver    Gout    History of kidney stones    History of methicillin resistant staphylococcus aureus (MRSA) 2016   Hypertension    H/O BEEN OFF BP MEDS FOR 1 YEAR   Hypothyroidism    Kidney stone    Knee pain    Left   Pulmonary emboli ([place]) 2011   Restless leg syndrome    Shoulder pain     Surgical History: Past Surgical History:  Procedure Laterality Date   ABDOMINAL HYSTERECTOMY     BACK SURGERY     BREAST REDUCTION SURGERY     CATARACT EXTRACTION W/PHACO Left 07/28/2017   Procedure: CATARACT EXTRACTION PHACO AND INTRAOCULAR LENS PLACEMENT ([place]) diabetic Left;  Surgeon: Leandrew Koyanagi, MD;  Location: [facility];  Service: Ophthalmology;  Laterality: Left;  Diabetic   CESAREAN SECTION  1990   CHOLECYSTECTOMY     CYSTOSCOPY/URETEROSCOPY/HOLMIUM LASER/STENT PLACEMENT Left 09/08/2017   Procedure:  CYSTOSCOPY/URETEROSCOPY/HOLMIUM LASER/STENT EXCHANGE;  Surgeon: Hollice Espy, MD;  Location: [facility];  Service: Urology;  Laterality: Left;   CYSTOSCOPY/URETEROSCOPY/HOLMIUM LASER/STENT PLACEMENT Left 12/14/2018   Procedure: CYSTOSCOPY/URETEROSCOPY/HOLMIUM LASER/STENT PLACEMENT;  Surgeon: Hollice Espy, MD;  Location: [facility];  Service: Urology;  Laterality: Left;   FOOT SURGERY     GASTRIC BYPASS     IR NEPHROSTOMY PLACEMENT LEFT  08/17/2017   KNEE ARTHROSCOPY Left    KNEE ARTHROSCOPY Left 07/19/2015   Procedure: ARTHROSCOPY KNEE;  Surgeon: Leanor Kail, MD;  Location: [facility];  Service: Orthopedics;  Laterality: Left;   NEPHROLITHOTOMY Left 08/17/2017   Procedure: NEPHROLITHOTOMY PERCUTANEOUS WITH HOLMIUM LASER;  Surgeon: Hollice Espy, MD;  Location: [facility];  Service: Urology;  Laterality: Left;   THUMB ARTHROSCOPY     TONSILLECTOMY      Home Medications:  Allergies as of 02/06/2022       Reactions   Sulfasalazine Hives   Nsaids    Avoids because of gastric bypass   Sulfa Antibiotics Hives   Penicillins Hives, Rash   Has patient had a PCN reaction causing immediate rash, facial/tongue/throat swelling, SOB or lightheadedness with hypotension: No Has patient had a PCN reaction causing severe rash involving mucus membranes or skin necrosis: No Has patient had a PCN reaction that required hospitalization: No Has patient had a PCN reaction occurring within the last 10 years: No If all of the  above answers are "NO", then may proceed with Cephalosporin use.        Medication List        Accurate as of February 05, 2022  2:20 PM. If you have any questions, ask your nurse or doctor.          atomoxetine 40 MG capsule Commonly known as: STRATTERA   atorvastatin 20 MG tablet Commonly known as: LIPITOR Take 20 mg by mouth every morning.   cyanocobalamin 1000 MCG/ML injection Commonly known as: (VITAMIN B-12) Inject 1,000 mcg into the muscle once a week.    cyclobenzaprine 10 MG tablet Commonly known as: FLEXERIL Take 10 mg by mouth every 8 (eight) hours.   Dulaglutide 0.75 MG/0.5ML Sopn Inject 0.75 mg into the skin every Thursday.   DULoxetine HCl 40 MG Cpep Take 1 capsule by mouth daily.   furosemide 20 MG tablet Commonly known as: LASIX Take by mouth.   gabapentin 300 MG capsule Commonly known as: NEURONTIN Take 300 mg by mouth at bedtime.   HYDROcodone-acetaminophen 5-325 MG tablet Commonly known as: NORCO/VICODIN Take 1 tablet by mouth 3 (three) times daily as needed.   levothyroxine 100 MCG tablet Commonly known as: SYNTHROID Take 100 mcg by mouth daily before breakfast.   lisinopril 20 MG tablet Commonly known as: ZESTRIL Take 20 mg by mouth daily.   meloxicam 15 MG tablet Commonly known as: MOBIC Take 15 mg by mouth daily.   metFORMIN 500 MG 24 hr tablet Commonly known as: GLUCOPHAGE-XR Take 500 mg by mouth daily.   metFORMIN 750 MG 24 hr tablet Commonly known as: GLUCOPHAGE-XR Take 1,500 mg by mouth every morning.   methocarbamol 500 MG tablet Commonly known as: ROBAXIN Take 500 mg by mouth 3 (three) times daily.   pramipexole 0.5 MG tablet Commonly known as: MIRAPEX Take 0.5-1 mg by mouth at bedtime.        Allergies:  Allergies  Allergen Reactions   Sulfasalazine Hives   Nsaids     Avoids because of gastric bypass   Sulfa Antibiotics Hives   Penicillins Hives and Rash    Has patient had a PCN reaction causing immediate rash, facial/tongue/throat swelling, SOB or lightheadedness with hypotension: No Has patient had a PCN reaction causing severe rash involving mucus membranes or skin necrosis: No Has patient had a PCN reaction that required hospitalization: No Has patient had a PCN reaction occurring within the last 10 years: No If all of the above answers are "NO", then may proceed with Cephalosporin use.     Family History: Family History  Problem Relation Age of Onset   Cancer Mother         breast   Heart disease Father    Diabetes Father    Hypertension Father    Cancer Maternal Grandmother    Cancer Maternal Grandfather    Heart disease Paternal Grandmother    Heart disease Paternal Grandfather    Cancer Maternal Aunt    Cancer Maternal Uncle    Heart disease Paternal Aunt    Heart disease Paternal Uncle    Kidney cancer Neg Hx    Bladder Cancer Neg Hx     Social History:  reports that she quit smoking about 6 years ago. Her smoking use included cigarettes. She has a 2.00 pack-year smoking history. She has never used smokeless tobacco. She reports that she does not currently use alcohol. She reports that she does not use drugs.   Physical Exam: There were no vitals  taken for this visit.  Constitutional:  Alert and oriented, No acute distress. HEENT: [place] AT, moist mucus membranes.  Trachea midline, no masses. Cardiovascular: No clubbing, cyanosis, or edema. Respiratory: Normal respiratory effort, no increased work of breathing. Skin: No rashes, bruises or suspicious lesions. Neurologic: Grossly intact, no focal deficits, moving all 4 extremities. Psychiatric: Normal mood and affect.  Laboratory Data:  Lab Results  Component Value Date   CREATININE 0.90 01/25/2022    Lab Results  Component Value Date   HGBA1C 8.8 (H) 08/11/2019    Urinalysis   Pertinent Imaging: CLINICAL DATA:  History of kidney stones 2 years ago on left side. Currently no pain or complaints.   EXAM: ABDOMEN - 1 VIEW   COMPARISON:  KUB 01/14/2021; CT abdomen and pelvis 08/11/2019   Cholecystectomy clips.   Moderate multilevel degenerative disc changes of the thoracic spine and lumbar spine. L4-5 posterior fusion hardware.   FINDINGS: Bowel-gas and stool obscure the majority of each kidney. Within this limitation, there are 2 adjacent calcific densities overlying the left kidney measuring up to 6 mm grossly similar to prior. A small calcification overlying the left  hemipelvis is unchanged and compatible with a vascular phlebolith.   High-grade stool burden again suggesting constipation.   IMPRESSION: There are small calcific densities overlying the left kidney, similar to the nonobstructing stone seen within the lower pole on 08/11/2019 CT.   High-grade stool burden again suggesting constipation.     Electronically Signed   By: Yvonne Kendall M.D.   On: 02/04/2022 17:53  Assessment & Plan:     No follow-ups on file.  I,Kailey Littlejohn,acting as a Education administrator for Hollice Espy, MD.,have documented all relevant documentation on the behalf of Hollice Espy, MD,as directed by  Hollice Espy, MD while in the presence of Hollice Espy, [place] [address] [place], [place] [address] [phone]

## 2022-02-06 ENCOUNTER — Ambulatory Visit: Payer: PRIVATE HEALTH INSURANCE | Admitting: Urology

## 2022-03-10 NOTE — Progress Notes (Incomplete)
? ?03/10/22 ?4:25 PM  ? ?Courtney Murphy ?Jan 30, 1956 ?[id number] ? ?Referring provider:  ?Audelia Acton, MD ?No address on file ?No chief complaint on file. ? ? ? ? ?HPI: ?Courtney Murphy is a 66 y.o.female with a personal history of recurrent nephrolithiasis and pyuria , who presents today for  ? ?She did have a metabolic work-up in 7408 which showed hypercalciuria and low volume.  She is been managed with dietary modification. ?  ?She does have an extensive history of kidney stones and requiring PCNL multiple ureteroscopy, most recently in 11/2018. ? ?KUB on 02/04/2022 visualized small calcific densities overlying the left kidney, similar to the nonobstructing stone seen within the lower pole on 08/11/2019 CT. ? ? ? ? ?PMH: ?Past Medical History:  ?Diagnosis Date  ? Acid reflux 10/11/2015  ? Anemia   ? RECEIVES IRON INFUSIONS  ? Arthritis   ? Asthma   ? WELL CONTROLLED  ? Depression   ? Diabetes mellitus without complication ([place])   ? Fatty liver   ? Gout   ? History of kidney stones   ? History of methicillin resistant staphylococcus aureus (MRSA) 2016  ? Hypertension   ? H/O BEEN OFF BP MEDS FOR 1 YEAR  ? Hypothyroidism   ? Kidney stone   ? Knee pain   ? Left  ? Pulmonary emboli ([place]) 2011  ? Restless leg syndrome   ? Shoulder pain   ? ? ?Surgical History: ?Past Surgical History:  ?Procedure Laterality Date  ? ABDOMINAL HYSTERECTOMY    ? BACK SURGERY    ? BREAST REDUCTION SURGERY    ? CATARACT EXTRACTION W/PHACO Left 07/28/2017  ? Procedure: CATARACT EXTRACTION PHACO AND INTRAOCULAR LENS PLACEMENT ([place]) diabetic Left;  Surgeon: Leandrew Koyanagi, MD;  Location: [facility];  Service: Ophthalmology;  Laterality: Left;  Diabetic  ? [place]  ? CHOLECYSTECTOMY    ? CYSTOSCOPY/URETEROSCOPY/HOLMIUM LASER/STENT PLACEMENT Left 09/08/2017  ? Procedure: CYSTOSCOPY/URETEROSCOPY/HOLMIUM LASER/STENT EXCHANGE;  Surgeon: Hollice Espy, MD;  Location: [facility];  Service: Urology;  Laterality: Left;   ? CYSTOSCOPY/URETEROSCOPY/HOLMIUM LASER/STENT PLACEMENT Left 12/14/2018  ? Procedure: CYSTOSCOPY/URETEROSCOPY/HOLMIUM LASER/STENT PLACEMENT;  Surgeon: Hollice Espy, MD;  Location: [facility];  Service: Urology;  Laterality: Left;  ? FOOT SURGERY    ? GASTRIC BYPASS    ? IR NEPHROSTOMY PLACEMENT LEFT  08/17/2017  ? KNEE ARTHROSCOPY Left   ? KNEE ARTHROSCOPY Left 07/19/2015  ? Procedure: ARTHROSCOPY KNEE;  Surgeon: Leanor Kail, MD;  Location: [facility];  Service: Orthopedics;  Laterality: Left;  ? NEPHROLITHOTOMY Left 08/17/2017  ? Procedure: NEPHROLITHOTOMY PERCUTANEOUS WITH HOLMIUM LASER;  Surgeon: Hollice Espy, MD;  Location: [facility];  Service: Urology;  Laterality: Left;  ? THUMB ARTHROSCOPY    ? TONSILLECTOMY    ? ? ?Home Medications:  ?Allergies as of 03/11/2022   ? ?   Reactions  ? Sulfasalazine Hives  ? Nsaids   ? Avoids because of gastric bypass  ? Sulfa Antibiotics Hives  ? Penicillins Hives, Rash  ? Has patient had a PCN reaction causing immediate rash, facial/tongue/throat swelling, SOB or lightheadedness with hypotension: No ?Has patient had a PCN reaction causing severe rash involving mucus membranes or skin necrosis: No ?Has patient had a PCN reaction that required hospitalization: No ?Has patient had a PCN reaction occurring within the last 10 years: No ?If all of the above answers are "NO", then may proceed with Cephalosporin use.  ? ?  ? ?  ?Medication List  ?  ? ?  ?  Accurate as of March 10, 2022  4:25 PM. If you have any questions, ask your nurse or doctor.  ?  ?  ? ?  ? ?atomoxetine 40 MG capsule ?Commonly known as: STRATTERA ?  ?atorvastatin 20 MG tablet ?Commonly known as: LIPITOR ?Take 20 mg by mouth every morning. ?  ?cyanocobalamin 1000 MCG/ML injection ?Commonly known as: (VITAMIN B-12) ?Inject 1,000 mcg into the muscle once a week. ?  ?cyclobenzaprine 10 MG tablet ?Commonly known as: FLEXERIL ?Take 10 mg by mouth every 8 (eight) hours. ?  ?Dulaglutide 0.75 MG/0.5ML Sopn ?Inject 0.75 mg  into the skin every Thursday. ?  ?DULoxetine HCl 40 MG Cpep ?Take 1 capsule by mouth daily. ?  ?gabapentin 300 MG capsule ?Commonly known as: NEURONTIN ?Take 300 mg by mouth at bedtime. ?  ?HYDROcodone-acetaminophen 5-325 MG tablet ?Commonly known as: NORCO/VICODIN ?Take 1 tablet by mouth 3 (three) times daily as needed. ?  ?levothyroxine 100 MCG tablet ?Commonly known as: SYNTHROID ?Take 100 mcg by mouth daily before breakfast. ?  ?lisinopril 20 MG tablet ?Commonly known as: ZESTRIL ?Take 20 mg by mouth daily. ?  ?meloxicam 15 MG tablet ?Commonly known as: MOBIC ?Take 15 mg by mouth daily. ?  ?metFORMIN 500 MG 24 hr tablet ?Commonly known as: GLUCOPHAGE-XR ?Take 500 mg by mouth daily. ?  ?metFORMIN 750 MG 24 hr tablet ?Commonly known as: GLUCOPHAGE-XR ?Take 1,500 mg by mouth every morning. ?  ?methocarbamol 500 MG tablet ?Commonly known as: ROBAXIN ?Take 500 mg by mouth 3 (three) times daily. ?  ?pramipexole 0.5 MG tablet ?Commonly known as: MIRAPEX ?Take 0.5-1 mg by mouth at bedtime. ?  ? ?  ? ? ?Allergies:  ?Allergies  ?Allergen Reactions  ? Sulfasalazine Hives  ? Nsaids   ?  Avoids because of gastric bypass  ? Sulfa Antibiotics Hives  ? Penicillins Hives and Rash  ?  Has patient had a PCN reaction causing immediate rash, facial/tongue/throat swelling, SOB or lightheadedness with hypotension: No ?Has patient had a PCN reaction causing severe rash involving mucus membranes or skin necrosis: No ?Has patient had a PCN reaction that required hospitalization: No ?Has patient had a PCN reaction occurring within the last 10 years: No ?If all of the above answers are "NO", then may proceed with Cephalosporin use. ?  ? ? ?Family History: ?Family History  ?Problem Relation Age of Onset  ? Cancer Mother   ?     breast  ? Heart disease Father   ? Diabetes Father   ? Hypertension Father   ? Cancer Maternal Grandmother   ? Cancer Maternal Grandfather   ? Heart disease Paternal Grandmother   ? Heart disease Paternal Grandfather    ? Cancer Maternal Aunt   ? Cancer Maternal Uncle   ? Heart disease Paternal Aunt   ? Heart disease Paternal Uncle   ? Kidney cancer Neg Hx   ? Bladder Cancer Neg Hx   ? ? ?Social History:  reports that she quit smoking about 6 years ago. Her smoking use included cigarettes. She has a 2.00 pack-year smoking history. She has never used smokeless tobacco. She reports that she does not currently use alcohol. She reports that she does not use drugs. ? ? ?Physical Exam: ?There were no vitals taken for this visit.  ?Constitutional:  Alert and oriented, No acute distress. ?HEENT: [place] AT, moist mucus membranes.  Trachea midline, no masses. ?Cardiovascular: No clubbing, cyanosis, or edema. ?Respiratory: Normal respiratory effort, no increased work of breathing. ?  Skin: No rashes, bruises or suspicious lesions. ?Neurologic: Grossly intact, no focal deficits, moving all 4 extremities. ?Psychiatric: Normal mood and affect. ? ?Laboratory Data: ? ?Lab Results  ?Component Value Date  ? CREATININE 0.90 01/25/2022  ? ?Lab Results  ?Component Value Date  ? HGBA1C 8.8 (H) 08/11/2019  ? ? ?Urinalysis ? ? ?Pertinent Imaging: ?CLINICAL DATA:  History of kidney stones 2 years ago on left side. ?Currently no pain or complaints. ?  ?EXAM: ?ABDOMEN - 1 VIEW ?  ?COMPARISON:  KUB 01/14/2021; CT abdomen and pelvis 08/11/2019 ?  ?Cholecystectomy clips. ?  ?Moderate multilevel degenerative disc changes of the thoracic spine ?and lumbar spine. L4-5 posterior fusion hardware. ?  ?FINDINGS: ?Bowel-gas and stool obscure the majority of each kidney. Within this ?limitation, there are 2 adjacent calcific densities overlying the ?left kidney measuring up to 6 mm grossly similar to prior. A small ?calcification overlying the left hemipelvis is unchanged and ?compatible with a vascular phlebolith. ?  ?High-grade stool burden again suggesting constipation. ?  ?IMPRESSION: ?There are small calcific densities overlying the left kidney, ?similar to the  nonobstructing stone seen within the lower pole on ?08/11/2019 CT. ?  ?High-grade stool burden again suggesting constipation. ?  ?  ?Electronically Signed ?  By: Yvonne Kendall M.D. ?  On: 02/04/2022 17:53

## 2022-03-11 ENCOUNTER — Ambulatory Visit: Payer: PRIVATE HEALTH INSURANCE | Admitting: Urology

## 2022-03-11 NOTE — Progress Notes (Incomplete)
? ?03/11/22 ?8:49 AM  ? ?Courtney Murphy ?[birth year] ?[id number] ? ?Referring provider:  ?Audelia Acton, MD ?No address on file ?No chief complaint on file. ? ? ? ? ?HPI: ?Courtney Murphy is a 66 y.o.female with a personal history of nephrolithiasis returns today for routine annual follow-up with KUB. ? ?She had metabolic work-up in 3419 which showed hypercalciuria and low volume.  She is been managed with dietary modification. ? ?She has an extensive history of kidney stones and requiring PCNL multiple ureteroscopy, most recently in 11/2018. ? ?KUB on 02/04/2022 visualized small calcific densities overlying the left kidney, similar to the nonobstructing stone seen within the lower pole on ?08/11/2019 CT ? ? ? ?PMH: ?Past Medical History:  ?Diagnosis Date  ? Acid reflux 10/11/2015  ? Anemia   ? RECEIVES IRON INFUSIONS  ? Arthritis   ? Asthma   ? WELL CONTROLLED  ? Depression   ? Diabetes mellitus without complication ([place])   ? Fatty liver   ? Gout   ? History of kidney stones   ? History of methicillin resistant staphylococcus aureus (MRSA) 2016  ? Hypertension   ? H/O BEEN OFF BP MEDS FOR 1 YEAR  ? Hypothyroidism   ? Kidney stone   ? Knee pain   ? Left  ? Pulmonary emboli ([place]) 2011  ? Restless leg syndrome   ? Shoulder pain   ? ? ?Surgical History: ?Past Surgical History:  ?Procedure Laterality Date  ? ABDOMINAL HYSTERECTOMY    ? BACK SURGERY    ? BREAST REDUCTION SURGERY    ? CATARACT EXTRACTION W/PHACO Left 07/28/2017  ? Procedure: CATARACT EXTRACTION PHACO AND INTRAOCULAR LENS PLACEMENT ([place]) diabetic Left;  Surgeon: Leandrew Koyanagi, MD;  Location: [facility];  Service: Ophthalmology;  Laterality: Left;  Diabetic  ? [place]  ? CHOLECYSTECTOMY    ? CYSTOSCOPY/URETEROSCOPY/HOLMIUM LASER/STENT PLACEMENT Left 09/08/2017  ? Procedure: CYSTOSCOPY/URETEROSCOPY/HOLMIUM LASER/STENT EXCHANGE;  Surgeon: Hollice Espy, MD;  Location: [facility];  Service: Urology;  Laterality: Left;  ?  CYSTOSCOPY/URETEROSCOPY/HOLMIUM LASER/STENT PLACEMENT Left 12/14/2018  ? Procedure: CYSTOSCOPY/URETEROSCOPY/HOLMIUM LASER/STENT PLACEMENT;  Surgeon: Hollice Espy, MD;  Location: [facility];  Service: Urology;  Laterality: Left;  ? FOOT SURGERY    ? GASTRIC BYPASS    ? IR NEPHROSTOMY PLACEMENT LEFT  08/17/2017  ? KNEE ARTHROSCOPY Left   ? KNEE ARTHROSCOPY Left 07/19/2015  ? Procedure: ARTHROSCOPY KNEE;  Surgeon: Leanor Kail, MD;  Location: [facility];  Service: Orthopedics;  Laterality: Left;  ? NEPHROLITHOTOMY Left 08/17/2017  ? Procedure: NEPHROLITHOTOMY PERCUTANEOUS WITH HOLMIUM LASER;  Surgeon: Hollice Espy, MD;  Location: [facility];  Service: Urology;  Laterality: Left;  ? THUMB ARTHROSCOPY    ? TONSILLECTOMY    ? ? ?Home Medications:  ?Allergies as of 03/11/2022   ? ?   Reactions  ? Sulfasalazine Hives  ? Nsaids   ? Avoids because of gastric bypass  ? Sulfa Antibiotics Hives  ? Penicillins Hives, Rash  ? Has patient had a PCN reaction causing immediate rash, facial/tongue/throat swelling, SOB or lightheadedness with hypotension: No ?Has patient had a PCN reaction causing severe rash involving mucus membranes or skin necrosis: No ?Has patient had a PCN reaction that required hospitalization: No ?Has patient had a PCN reaction occurring within the last 10 years: No ?If all of the above answers are "NO", then may proceed with Cephalosporin use.  ? ?  ? ?  ?Medication List  ?  ? ?  ? Accurate as of  March 11, 2022  8:49 AM. If you have any questions, ask your nurse or doctor.  ?  ?  ? ?  ? ?atomoxetine 40 MG capsule ?Commonly known as: STRATTERA ?  ?atorvastatin 20 MG tablet ?Commonly known as: LIPITOR ?Take 20 mg by mouth every morning. ?  ?cyanocobalamin 1000 MCG/ML injection ?Commonly known as: (VITAMIN B-12) ?Inject 1,000 mcg into the muscle once a week. ?  ?cyclobenzaprine 10 MG tablet ?Commonly known as: FLEXERIL ?Take 10 mg by mouth every 8 (eight) hours. ?  ?Dulaglutide 0.75 MG/0.5ML Sopn ?Inject 0.75 mg  into the skin every Thursday. ?  ?DULoxetine HCl 40 MG Cpep ?Take 1 capsule by mouth daily. ?  ?gabapentin 300 MG capsule ?Commonly known as: NEURONTIN ?Take 300 mg by mouth at bedtime. ?  ?HYDROcodone-acetaminophen 5-325 MG tablet ?Commonly known as: NORCO/VICODIN ?Take 1 tablet by mouth 3 (three) times daily as needed. ?  ?levothyroxine 100 MCG tablet ?Commonly known as: SYNTHROID ?Take 100 mcg by mouth daily before breakfast. ?  ?lisinopril 20 MG tablet ?Commonly known as: ZESTRIL ?Take 20 mg by mouth daily. ?  ?meloxicam 15 MG tablet ?Commonly known as: MOBIC ?Take 15 mg by mouth daily. ?  ?metFORMIN 500 MG 24 hr tablet ?Commonly known as: GLUCOPHAGE-XR ?Take 500 mg by mouth daily. ?  ?metFORMIN 750 MG 24 hr tablet ?Commonly known as: GLUCOPHAGE-XR ?Take 1,500 mg by mouth every morning. ?  ?methocarbamol 500 MG tablet ?Commonly known as: ROBAXIN ?Take 500 mg by mouth 3 (three) times daily. ?  ?pramipexole 0.5 MG tablet ?Commonly known as: MIRAPEX ?Take 0.5-1 mg by mouth at bedtime. ?  ? ?  ? ? ?Allergies:  ?Allergies  ?Allergen Reactions  ? Sulfasalazine Hives  ? Nsaids   ?  Avoids because of gastric bypass  ? Sulfa Antibiotics Hives  ? Penicillins Hives and Rash  ?  Has patient had a PCN reaction causing immediate rash, facial/tongue/throat swelling, SOB or lightheadedness with hypotension: No ?Has patient had a PCN reaction causing severe rash involving mucus membranes or skin necrosis: No ?Has patient had a PCN reaction that required hospitalization: No ?Has patient had a PCN reaction occurring within the last 10 years: No ?If all of the above answers are "NO", then may proceed with Cephalosporin use. ?  ? ? ?Family History: ?Family History  ?Problem Relation Age of Onset  ? Cancer Mother   ?     breast  ? Heart disease Father   ? Diabetes Father   ? Hypertension Father   ? Cancer Maternal Grandmother   ? Cancer Maternal Grandfather   ? Heart disease Paternal Grandmother   ? Heart disease Paternal Grandfather    ? Cancer Maternal Aunt   ? Cancer Maternal Uncle   ? Heart disease Paternal Aunt   ? Heart disease Paternal Uncle   ? Kidney cancer Neg Hx   ? Bladder Cancer Neg Hx   ? ? ?Social History:  reports that she quit smoking about 6 years ago. Her smoking use included cigarettes. She has a 2.00 pack-year smoking history. She has never used smokeless tobacco. She reports that she does not currently use alcohol. She reports that she does not use drugs. ? ? ?Physical Exam: ?There were no vitals taken for this visit.  ?Constitutional:  Alert and oriented, No acute distress. ?HEENT: [place] AT, moist mucus membranes.  Trachea midline, no masses. ?Cardiovascular: No clubbing, cyanosis, or edema. ?Respiratory: Normal respiratory effort, no increased work of breathing. ?Skin: No rashes,  bruises or suspicious lesions. ?Neurologic: Grossly intact, no focal deficits, moving all 4 extremities. ?Psychiatric: Normal mood and affect. ? ?Laboratory Data: ? ?Lab Results  ?Component Value Date  ? CREATININE 0.90 01/25/2022  ? ?Lab Results  ?Component Value Date  ? HGBA1C 8.8 (H) 08/11/2019  ? ? ?Urinalysis ? ? ?Pertinent Imaging: ?CLINICAL DATA:  History of kidney stones 2 years ago on left side. ?Currently no pain or complaints. ?  ?EXAM: ?ABDOMEN - 1 VIEW ?  ?COMPARISON:  KUB 01/14/2021; CT abdomen and pelvis 08/11/2019 ?  ?Cholecystectomy clips. ?  ?Moderate multilevel degenerative disc changes of the thoracic spine ?and lumbar spine. L4-5 posterior fusion hardware. ?  ?FINDINGS: ?Bowel-gas and stool obscure the majority of each kidney. Within this ?limitation, there are 2 adjacent calcific densities overlying the ?left kidney measuring up to 6 mm grossly similar to prior. A small ?calcification overlying the left hemipelvis is unchanged and ?compatible with a vascular phlebolith. ?  ?High-grade stool burden again suggesting constipation. ?  ?IMPRESSION: ?There are small calcific densities overlying the left kidney, ?similar to the  nonobstructing stone seen within the lower pole on ?08/11/2019 CT. ?  ?High-grade stool burden again suggesting constipation. ?  ?  ?Electronically Signed ?  By: Yvonne Kendall M.D. ?  On: 02/04/2022 17:53 ?  ? ?I ha

## 2022-03-16 NOTE — Progress Notes (Addendum)
? ?03/18/2022 ?8:10 AM  ? ?Courtney Murphy ?May 05, 1956 ?[id number] ? ?Referring provider:  ?Audelia Acton, MD ?No address on file ?Chief Complaint  ?Patient presents with  ? Nephrolithiasis  ? ? ?HPI: ?Courtney Murphy is a 66 y.o.female personal history of nephrolithiasis for routine annual follow-up with KUB. ?  ?She did have a metabolic work-up in 2297 which showed hypercalciuria and low volume.  She is been managed with dietary modification. ?  ?She does have an extensive history of kidney stones and requiring PCNL multiple ureteroscopy, most recently in 11/2018. ? ?KUB on 02/04/2022 visualized small calcific densities overlying the left kidney, similar to the nonobstructing stone seen within the lower pole on 08/11/2019 CT.  ? ?No flank pain or stone episodes this year.   ? ?She reports that she has low back pain and wonders if she has a UTI.  She has no other lower urinary tract symptoms including dysuria.  She would like her urine checked today.   She also reports foam in her urine.  ? ? ?PMH: ?Past Medical History:  ?Diagnosis Date  ? Acid reflux 10/11/2015  ? Anemia   ? RECEIVES IRON INFUSIONS  ? Arthritis   ? Asthma   ? WELL CONTROLLED  ? Depression   ? Diabetes mellitus without complication ([place])   ? Fatty liver   ? Gout   ? History of kidney stones   ? History of methicillin resistant staphylococcus aureus (MRSA) 2016  ? Hypertension   ? H/O BEEN OFF BP MEDS FOR 1 YEAR  ? Hypothyroidism   ? Kidney stone   ? Knee pain   ? Left  ? Pulmonary emboli ([place]) 2011  ? Restless leg syndrome   ? Shoulder pain   ? ? ?Surgical History: ?Past Surgical History:  ?Procedure Laterality Date  ? ABDOMINAL HYSTERECTOMY    ? BACK SURGERY    ? BREAST REDUCTION SURGERY    ? CATARACT EXTRACTION W/PHACO Left 07/28/2017  ? Procedure: CATARACT EXTRACTION PHACO AND INTRAOCULAR LENS PLACEMENT ([place]) diabetic Left;  Surgeon: Leandrew Koyanagi, MD;  Location: [facility];  Service: Ophthalmology;  Laterality: Left;  Diabetic   ? [place]  ? CHOLECYSTECTOMY    ? CYSTOSCOPY/URETEROSCOPY/HOLMIUM LASER/STENT PLACEMENT Left 09/08/2017  ? Procedure: CYSTOSCOPY/URETEROSCOPY/HOLMIUM LASER/STENT EXCHANGE;  Surgeon: Hollice Espy, MD;  Location: [facility];  Service: Urology;  Laterality: Left;  ? CYSTOSCOPY/URETEROSCOPY/HOLMIUM LASER/STENT PLACEMENT Left 12/14/2018  ? Procedure: CYSTOSCOPY/URETEROSCOPY/HOLMIUM LASER/STENT PLACEMENT;  Surgeon: Hollice Espy, MD;  Location: [facility];  Service: Urology;  Laterality: Left;  ? FOOT SURGERY    ? GASTRIC BYPASS    ? IR NEPHROSTOMY PLACEMENT LEFT  08/17/2017  ? KNEE ARTHROSCOPY Left   ? KNEE ARTHROSCOPY Left 07/19/2015  ? Procedure: ARTHROSCOPY KNEE;  Surgeon: Leanor Kail, MD;  Location: [facility];  Service: Orthopedics;  Laterality: Left;  ? NEPHROLITHOTOMY Left 08/17/2017  ? Procedure: NEPHROLITHOTOMY PERCUTANEOUS WITH HOLMIUM LASER;  Surgeon: Hollice Espy, MD;  Location: [facility];  Service: Urology;  Laterality: Left;  ? THUMB ARTHROSCOPY    ? TONSILLECTOMY    ? ? ?Home Medications:  ?Allergies as of 03/18/2022   ? ?   Reactions  ? Sulfasalazine Hives  ? Nsaids   ? Avoids because of gastric bypass  ? Sulfa Antibiotics Hives  ? Penicillins Hives, Rash  ? Has patient had a PCN reaction causing immediate rash, facial/tongue/throat swelling, SOB or lightheadedness with hypotension: No ?Has patient had a PCN reaction causing severe rash involving mucus membranes  or skin necrosis: No ?Has patient had a PCN reaction that required hospitalization: No ?Has patient had a PCN reaction occurring within the last 10 years: No ?If all of the above answers are "NO", then may proceed with Cephalosporin use.  ? ?  ? ?  ?Medication List  ?  ? ?  ? Accurate as of March 18, 2022 11:59 PM. If you have any questions, ask your nurse or doctor.  ?  ?  ? ?  ? ?STOP taking these medications   ? ?methocarbamol 500 MG tablet ?Commonly known as: ROBAXIN ?  ? ?  ? ?TAKE these medications   ? ?atomoxetine 40 MG  capsule ?Commonly known as: STRATTERA ?  ?atorvastatin 20 MG tablet ?Commonly known as: LIPITOR ?Take 20 mg by mouth every morning. ?  ?cyanocobalamin 1000 MCG/ML injection ?Commonly known as: (VITAMIN B-12) ?Inject 1,000 mcg into the muscle once a week. ?  ?cyclobenzaprine 10 MG tablet ?Commonly known as: FLEXERIL ?Take 10 mg by mouth every 8 (eight) hours. ?  ?Dulaglutide 0.75 MG/0.5ML Sopn ?Inject 0.75 mg into the skin every Thursday. ?  ?DULoxetine HCl 40 MG Cpep ?Take 1 capsule by mouth daily. ?  ?gabapentin 300 MG capsule ?Commonly known as: NEURONTIN ?Take 300 mg by mouth at bedtime. ?  ?HYDROcodone-acetaminophen 5-325 MG tablet ?Commonly known as: NORCO/VICODIN ?Take 1 tablet by mouth 3 (three) times daily as needed. ?  ?levothyroxine 100 MCG tablet ?Commonly known as: SYNTHROID ?Take 100 mcg by mouth daily before breakfast. ?  ?lisinopril 20 MG tablet ?Commonly known as: ZESTRIL ?Take 20 mg by mouth daily. ?  ?meloxicam 15 MG tablet ?Commonly known as: MOBIC ?Take 15 mg by mouth daily. ?  ?metFORMIN 750 MG 24 hr tablet ?Commonly known as: GLUCOPHAGE-XR ?Take by mouth. ?What changed: Another medication with the same name was removed. Continue taking this medication, and follow the directions you see here. ?  ?pramipexole 0.5 MG tablet ?Commonly known as: MIRAPEX ?Take 0.5-1 mg by mouth at bedtime. ?  ? ?  ? ? ?Allergies:  ?Allergies  ?Allergen Reactions  ? Sulfasalazine Hives  ? Nsaids   ?  Avoids because of gastric bypass  ? Sulfa Antibiotics Hives  ? Penicillins Hives and Rash  ?  Has patient had a PCN reaction causing immediate rash, facial/tongue/throat swelling, SOB or lightheadedness with hypotension: No ?Has patient had a PCN reaction causing severe rash involving mucus membranes or skin necrosis: No ?Has patient had a PCN reaction that required hospitalization: No ?Has patient had a PCN reaction occurring within the last 10 years: No ?If all of the above answers are "NO", then may proceed with  Cephalosporin use. ?  ? ? ?Family History: ?Family History  ?Problem Relation Age of Onset  ? Cancer Mother   ?     breast  ? Heart disease Father   ? Diabetes Father   ? Hypertension Father   ? Cancer Maternal Grandmother   ? Cancer Maternal Grandfather   ? Heart disease Paternal Grandmother   ? Heart disease Paternal Grandfather   ? Cancer Maternal Aunt   ? Cancer Maternal Uncle   ? Heart disease Paternal Aunt   ? Heart disease Paternal Uncle   ? Kidney cancer Neg Hx   ? Bladder Cancer Neg Hx   ? ? ?Social History:  reports that she quit smoking about 6 years ago. Her smoking use included cigarettes. She has a 2.00 pack-year smoking history. She has never used smokeless tobacco. She  reports that she does not currently use alcohol. She reports that she does not use drugs. ? ? ?Physical Exam: ?BP 123/74   Pulse (!) 101   Ht '5\' 4"'$  (1.626 m)   Wt 170 lb (77.1 kg)   BMI 29.18 kg/m?   ?Constitutional:  Alert and oriented, No acute distress. ?HEENT: [place] AT, moist mucus membranes.  Trachea midline, no masses. ?Cardiovascular: No clubbing, cyanosis, or edema. ?Respiratory: Normal respiratory effort, no increased work of breathing. ?Skin: No rashes, bruises or suspicious lesions. ?Neurologic: Grossly intact, no focal deficits, moving all 4 extremities. ?Psychiatric: Normal mood and affect. ? ?Laboratory Data: ?Lab Results  ?Component Value Date  ? CREATININE 0.90 01/25/2022  ? ?Lab Results  ?Component Value Date  ? HGBA1C 8.8 (H) 08/11/2019  ? ? ?Urinalysis ?Results for orders placed or performed in visit on 03/18/22  ?CULTURE, URINE COMPREHENSIVE  ? Specimen: Urine  ? UR  ?Result Value Ref Range  ? Urine Culture, Comprehensive Final report   ? Organism ID, Bacteria Comment   ? Organism ID, Bacteria Not applicable   ?Microscopic Examination  ? Urine  ?Result Value Ref Range  ? WBC, UA 11-30 (A) 0 - 5 /hpf  ? RBC 0-2 0 - 2 /hpf  ? Epithelial Cells (non renal) 0-10 0 - 10 /hpf  ? Bacteria, UA Moderate (A) None seen/Few   ?Urinalysis, Complete  ?Result Value Ref Range  ? Specific Gravity, UA 1.025 1.005 - 1.030  ? pH, UA 5.5 5.0 - 7.5  ? Color, UA Yellow Yellow  ? Appearance Ur Clear Clear  ? Leukocytes,UA 1+ (A) Negative  ? Protein,

## 2022-03-18 ENCOUNTER — Ambulatory Visit: Payer: Medicare PPO | Admitting: Urology

## 2022-03-18 VITALS — BP 123/74 | HR 101 | Ht 64.0 in | Wt 170.0 lb

## 2022-03-18 DIAGNOSIS — M545 Low back pain, unspecified: Secondary | ICD-10-CM

## 2022-03-18 DIAGNOSIS — N200 Calculus of kidney: Secondary | ICD-10-CM | POA: Diagnosis not present

## 2022-03-19 LAB — URINALYSIS, COMPLETE
Bilirubin, UA: NEGATIVE
Glucose, UA: NEGATIVE
Nitrite, UA: NEGATIVE
Protein,UA: NEGATIVE
RBC, UA: NEGATIVE
Specific Gravity, UA: 1.025 (ref 1.005–1.030)
Urobilinogen, Ur: 0.2 mg/dL (ref 0.2–1.0)
pH, UA: 5.5 (ref 5.0–7.5)

## 2022-03-19 LAB — MICROSCOPIC EXAMINATION

## 2022-03-21 LAB — CULTURE, URINE COMPREHENSIVE

## 2022-03-27 ENCOUNTER — Encounter: Payer: Self-pay | Admitting: Internal Medicine

## 2022-03-27 ENCOUNTER — Inpatient Hospital Stay (HOSPITAL_BASED_OUTPATIENT_CLINIC_OR_DEPARTMENT_OTHER): Payer: Medicare PPO | Admitting: Internal Medicine

## 2022-03-27 ENCOUNTER — Inpatient Hospital Stay: Payer: Medicare PPO

## 2022-03-27 ENCOUNTER — Inpatient Hospital Stay: Payer: Medicare PPO | Attending: Internal Medicine

## 2022-03-27 DIAGNOSIS — Z803 Family history of malignant neoplasm of breast: Secondary | ICD-10-CM | POA: Diagnosis not present

## 2022-03-27 DIAGNOSIS — Z86711 Personal history of pulmonary embolism: Secondary | ICD-10-CM | POA: Insufficient documentation

## 2022-03-27 DIAGNOSIS — Z882 Allergy status to sulfonamides status: Secondary | ICD-10-CM | POA: Insufficient documentation

## 2022-03-27 DIAGNOSIS — Z87891 Personal history of nicotine dependence: Secondary | ICD-10-CM | POA: Insufficient documentation

## 2022-03-27 DIAGNOSIS — Z880 Allergy status to penicillin: Secondary | ICD-10-CM | POA: Insufficient documentation

## 2022-03-27 DIAGNOSIS — R760 Raised antibody titer: Secondary | ICD-10-CM | POA: Diagnosis not present

## 2022-03-27 DIAGNOSIS — M549 Dorsalgia, unspecified: Secondary | ICD-10-CM | POA: Insufficient documentation

## 2022-03-27 DIAGNOSIS — Z9049 Acquired absence of other specified parts of digestive tract: Secondary | ICD-10-CM | POA: Diagnosis not present

## 2022-03-27 DIAGNOSIS — D509 Iron deficiency anemia, unspecified: Secondary | ICD-10-CM | POA: Diagnosis present

## 2022-03-27 DIAGNOSIS — Z87442 Personal history of urinary calculi: Secondary | ICD-10-CM | POA: Insufficient documentation

## 2022-03-27 DIAGNOSIS — Z809 Family history of malignant neoplasm, unspecified: Secondary | ICD-10-CM | POA: Diagnosis not present

## 2022-03-27 DIAGNOSIS — K909 Intestinal malabsorption, unspecified: Secondary | ICD-10-CM

## 2022-03-27 DIAGNOSIS — R5383 Other fatigue: Secondary | ICD-10-CM | POA: Insufficient documentation

## 2022-03-27 DIAGNOSIS — Z886 Allergy status to analgesic agent status: Secondary | ICD-10-CM | POA: Diagnosis not present

## 2022-03-27 DIAGNOSIS — Z8249 Family history of ischemic heart disease and other diseases of the circulatory system: Secondary | ICD-10-CM | POA: Diagnosis not present

## 2022-03-27 DIAGNOSIS — Z79899 Other long term (current) drug therapy: Secondary | ICD-10-CM | POA: Insufficient documentation

## 2022-03-27 DIAGNOSIS — M2550 Pain in unspecified joint: Secondary | ICD-10-CM | POA: Insufficient documentation

## 2022-03-27 DIAGNOSIS — Z8614 Personal history of Methicillin resistant Staphylococcus aureus infection: Secondary | ICD-10-CM | POA: Diagnosis not present

## 2022-03-27 DIAGNOSIS — Z833 Family history of diabetes mellitus: Secondary | ICD-10-CM | POA: Insufficient documentation

## 2022-03-27 DIAGNOSIS — Z9884 Bariatric surgery status: Secondary | ICD-10-CM | POA: Diagnosis not present

## 2022-03-27 LAB — COMPREHENSIVE METABOLIC PANEL
ALT: 18 U/L (ref 0–44)
AST: 21 U/L (ref 15–41)
Albumin: 4.1 g/dL (ref 3.5–5.0)
Alkaline Phosphatase: 111 U/L (ref 38–126)
Anion gap: 8 (ref 5–15)
BUN: 16 mg/dL (ref 8–23)
CO2: 28 mmol/L (ref 22–32)
Calcium: 9.3 mg/dL (ref 8.9–10.3)
Chloride: 103 mmol/L (ref 98–111)
Creatinine, Ser: 0.81 mg/dL (ref 0.44–1.00)
GFR, Estimated: >60 mL/min (ref >60)
Glucose, Bld: 98 mg/dL (ref 70–99)
Potassium: 3.9 mmol/L (ref 3.5–5.1)
Sodium: 139 mmol/L (ref 135–145)
Total Bilirubin: 0.6 mg/dL (ref 0.3–1.2)
Total Protein: 7.4 g/dL (ref 6.5–8.1)

## 2022-03-27 LAB — CBC WITH DIFFERENTIAL/PLATELET
Abs Immature Granulocytes: 0.03 10*3/uL (ref 0.00–0.07)
Basophils Absolute: 0.1 10*3/uL (ref 0.0–0.1)
Basophils Relative: 1 %
Eosinophils Absolute: 0.2 10*3/uL (ref 0.0–0.5)
Eosinophils Relative: 2 %
HCT: 36 % (ref 36.0–46.0)
Hemoglobin: 12.1 g/dL (ref 12.0–15.0)
Immature Granulocytes: 0 %
Lymphocytes Relative: 27 %
Lymphs Abs: 2.8 10*3/uL (ref 0.7–4.0)
MCH: 30.6 pg (ref 26.0–34.0)
MCHC: 33.6 g/dL (ref 30.0–36.0)
MCV: 90.9 fL (ref 80.0–100.0)
Monocytes Absolute: 0.6 10*3/uL (ref 0.1–1.0)
Monocytes Relative: 6 %
Neutro Abs: 6.7 10*3/uL (ref 1.7–7.7)
Neutrophils Relative %: 64 %
Platelets: 288 10*3/uL (ref 150–400)
RBC: 3.96 MIL/uL (ref 3.87–5.11)
RDW: 13 % (ref 11.5–15.5)
WBC: 10.4 10*3/uL (ref 4.0–10.5)
nRBC: 0 % (ref 0.0–0.2)

## 2022-03-27 LAB — IRON AND TIBC
Iron: 67 ug/dL (ref 28–170)
Saturation Ratios: 14 % (ref 10.4–31.8)
TIBC: 473 ug/dL — ABNORMAL HIGH (ref 250–450)
UIBC: 406 ug/dL

## 2022-03-27 LAB — FERRITIN: Ferritin: 17 ng/mL (ref 11–307)

## 2022-03-27 NOTE — Progress Notes (Signed)
Having surgery on her lt hip/bursa May 11th. ?

## 2022-03-27 NOTE — Progress Notes (Signed)
[place] ?CONSULT NOTE ? ?Patient Care Team: ?Audelia Acton, MD as PCP - General (Internal Medicine) ? ?CHIEF COMPLAINTS/PURPOSE OF CONSULTATION: DVT/PE ? 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 ?# Gastric By pass [lost 80 pounds; 2011]; JAN 2022-iron deficiency noted; IV Venofer ? ?# Joint pains [Dr.Patel; [facility]] ?Oncology History  ?Antiphospholipid antibody positive  ? ? ? ?HISTORY OF PRESENTING ILLNESS: Alone ambulating independently. ? ?Courtney Murphy 66 y.o.  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 ? ?In the interim patient underwent right knee replacement.  Recovery uneventful.  On aspirin prophylaxis. ? ?No blood in stools or black-colored stools.  Admits to noncompliance with her iron/barimelts.  ? ?Review of Systems  ?Constitutional:  Positive for malaise/fatigue. Negative for chills, diaphoresis, fever and weight loss.  ?HENT:  Negative for nosebleeds and sore throat.   ?Eyes:  Negative for double vision.  ?Respiratory:  Negative for cough, hemoptysis, sputum production, shortness of breath and wheezing.   ?Cardiovascular:  Negative for chest pain, palpitations, orthopnea and leg swelling.  ?Gastrointestinal:  Negative for abdominal pain, blood in stool, constipation, diarrhea, heartburn, melena, nausea and vomiting.  ?Genitourinary:  Negative for dysuria, frequency and urgency.  ?Musculoskeletal:  Positive for back pain and joint pain.  ?Skin: Negative.  Negative for itching and rash.  ?Neurological:   Negative for dizziness, tingling, focal weakness, weakness and headaches.  ?Endo/Heme/Allergies:  Bruises/bleeds easily.  ?Psychiatric/Behavioral:  Negative for depression. The patient is not nervous/anxious and does not have insomnia.    ? ?MEDICAL HISTORY:  ?Past Medical History:  ?Diagnosis Date  ? Acid reflux 10/11/2015  ? Anemia   ? RECEIVES IRON INFUSIONS  ? Arthritis   ? Asthma   ? WELL CONTROLLED  ? Depression   ? Diabetes mellitus without complication ([place])   ? Fatty liver   ? Gout   ? History of kidney stones   ? History of methicillin resistant staphylococcus aureus (MRSA) 2016  ? Hypertension   ? H/O BEEN OFF BP MEDS FOR 1 YEAR  ? Hypothyroidism   ? Kidney stone   ? Knee pain   ? Left  ? Pulmonary emboli ([place]) 2011  ? Restless leg syndrome   ? Shoulder pain   ? ? ?SURGICAL HISTORY: ?Past Surgical History:  ?Procedure Laterality Date  ? ABDOMINAL HYSTERECTOMY    ? BACK SURGERY    ? BREAST REDUCTION SURGERY    ? CATARACT EXTRACTION W/PHACO Left 07/28/2017  ? Procedure: CATARACT EXTRACTION PHACO AND INTRAOCULAR LENS PLACEMENT ([place]) diabetic Left;  Surgeon: Leandrew Koyanagi, MD;  Location: [facility];  Service: Ophthalmology;  Laterality: Left;  Diabetic  ? [place]  ? CHOLECYSTECTOMY    ? CYSTOSCOPY/URETEROSCOPY/HOLMIUM LASER/STENT PLACEMENT Left 09/08/2017  ? Procedure: CYSTOSCOPY/URETEROSCOPY/HOLMIUM LASER/STENT EXCHANGE;  Surgeon: Hollice Espy, MD;  Location: [facility];  Service: Urology;  Laterality: Left;  ? CYSTOSCOPY/URETEROSCOPY/HOLMIUM LASER/STENT PLACEMENT Left 12/14/2018  ? Procedure: CYSTOSCOPY/URETEROSCOPY/HOLMIUM LASER/STENT PLACEMENT;  Surgeon: Hollice Espy, MD;  Location: [facility];  Service: Urology;  Laterality: Left;  ? FOOT SURGERY    ? GASTRIC BYPASS    ? IR  NEPHROSTOMY PLACEMENT LEFT  08/17/2017  ? KNEE ARTHROSCOPY Left   ? KNEE ARTHROSCOPY Left 07/19/2015  ? Procedure: ARTHROSCOPY KNEE;  Surgeon: Leanor Kail, MD;  Location: [facility];  Service:  Orthopedics;  Laterality: Left;  ? NEPHROLITHOTOMY Left 08/17/2017  ? Procedure: NEPHROLITHOTOMY PERCUTANEOUS WITH HOLMIUM LASER;  Surgeon: Hollice Espy, MD;  Location: [facility];  Service: Urology;  Laterality: Left;  ? 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/2021  ? THUMB ARTHROSCOPY    ? TONSILLECTOMY    ? ? ?SOCIAL HISTORY: ?Social History  ? ?Socioeconomic History  ? Marital status: Widowed  ?  Spouse name: Not on file  ? Number of children: Not on file  ? Years of education: Not on file  ? Highest education level: Not on file  ?Occupational History  ? Not on file  ?Tobacco Use  ? Smoking status: Former  ?  Packs/day: 0.50  ?  Years: 4.00  ?  Pack years: 2.00  ?  Types: Cigarettes  ?  Quit date: 04/14/2015  ?  Years since quitting: 6.9  ? Smokeless tobacco: Never  ?Vaping Use  ? Vaping Use: Never used  ?Substance and Sexual Activity  ? Alcohol use: Not Currently  ?  Comment: RARE  ? Drug use: No  ? Sexual activity: Never  ?Other Topics Concern  ? Not on file  ?Social History Narrative  ? Quit smoking > 30 years ago; rare alcohol. Used to worked at [employer]; receptionist at [employer]. Lives in [place]; with dog.   ? ?Social Determinants of Health  ? ?Financial Resource Strain: Not on file  ?Food Insecurity: Not on file  ?Transportation Needs: Not on file  ?Physical Activity: Not on file  ?Stress: Not on file  ?Social Connections: Not on file  ?Intimate Partner Violence: Not on file  ? ? ?FAMILY HISTORY: ?Family History  ?Problem Relation Age of Onset  ? Cancer Mother   ?     breast  ? Heart disease Father   ? Diabetes Father   ? Hypertension Father   ? Cancer Maternal Grandmother   ? Cancer Maternal Grandfather   ? Heart disease Paternal Grandmother   ? Heart disease Paternal Grandfather   ? Cancer Maternal Aunt   ? Cancer Maternal Uncle   ? Heart disease Paternal Aunt   ? Heart disease Paternal Uncle   ? Kidney cancer Neg Hx   ? Bladder Cancer Neg Hx   ? ? ?ALLERGIES:  is allergic to  sulfasalazine, nsaids, sulfa antibiotics, and penicillins. ? ?MEDICATIONS:  ?Current Outpatient Medications  ?Medication Sig Dispense Refill  ? atomoxetine (STRATTERA) 40 MG capsule     ? atorvastatin (LIPITOR) 20 MG tablet Take 20 mg by mouth every morning.     ? cyanocobalamin (,VITAMIN B-12,) 1000 MCG/ML injection Inject 1,000 mcg into the muscle once a week.    ? cyclobenzaprine (FLEXERIL) 10 MG tablet Take 10 mg by mouth every 8 (eight) hours.    ? Dulaglutide 0.75 MG/0.5ML SOPN Inject 0.75 mg into the skin every Thursday.     ? DULoxetine HCl 40 MG CPEP Take 1 capsule by mouth daily.    ? gabapentin (NEURONTIN) 300 MG capsule Take 300 mg by mouth at bedtime.    ? HYDROcodone-acetaminophen (NORCO/VICODIN) 5-325 MG tablet Take 1 tablet by mouth 3 (three) times daily as needed.    ? levothyroxine (SYNTHROID, LEVOTHROID) 100 MCG tablet Take 100 mcg by mouth daily before breakfast.    ? lisinopril (ZESTRIL) 20  MG tablet Take 20 mg by mouth daily.    ? metFORMIN (GLUCOPHAGE-XR) 750 MG 24 hr tablet Take by mouth.    ? pramipexole (MIRAPEX) 0.5 MG tablet Take 0.5-1 mg by mouth at bedtime.     ? meloxicam (MOBIC) 15 MG tablet Take 15 mg by mouth daily.    ? ?No current facility-administered medications for this visit.  ? ? ?  ?. ? ?PHYSICAL EXAMINATION: ? ?Vitals:  ? 03/27/22 1413  ?BP: 136/79  ?Pulse: 89  ?Temp: (!) 97 ?F (36.1 ?C)  ?SpO2: 100%  ? ?Filed Weights  ? 03/27/22 1413  ?Weight: 178 lb 9.6 oz (81 kg)  ? ? ?Physical Exam ?HENT:  ?   Head: Normocephalic and atraumatic.  ?   Mouth/Throat:  ?   Pharynx: No oropharyngeal exudate.  ?Eyes:  ?   Pupils: Pupils are equal, round, and reactive to light.  ?Cardiovascular:  ?   Rate and Rhythm: Normal rate and regular rhythm.  ?Pulmonary:  ?   Effort: Pulmonary effort is normal. No respiratory distress.  ?   Breath sounds: Normal breath sounds. No wheezing.  ?Abdominal:  ?   General: Bowel sounds are normal. There is no distension.  ?   Palpations: Abdomen is soft. There  is no mass.  ?   Tenderness: There is no abdominal tenderness. There is no guarding or rebound.  ?Musculoskeletal:     ?   General: No tenderness. Normal range of motion.  ?   Cervical back: Normal range of motio

## 2022-03-27 NOTE — Assessment & Plan Note (Addendum)
#  Iron deficiency anemia [ gastric bypass-]-low iron saturation/ferritin.  S/p IV Venofer; hemoglobin is 12.5.  HOLD venofer today. Recommend compliance barimelts.  ? ?# b12 def-continue home B12 injections. ? 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  Prior history of PE 10-15 years ago; currently not on  anticoagulation.  Not recommend any anticoagulation just based on abnormal serology-especially if lupus anticoagulant is negative. STABLE.  ? ?# Right knee TKA- uneventful-postoperative recovery.  Aspirin prophylaxis reasonable. ? ?# DISPOSITION: ?# HOLD Venofer today ?# follow up in 6 months-MD; labs- cbc/bmp/iiton studies/ferritin- Nilda Riggs venofer-Dr.B ? ?

## 2022-04-23 ENCOUNTER — Other Ambulatory Visit: Payer: Self-pay | Admitting: Physician Assistant

## 2022-04-23 ENCOUNTER — Encounter: Payer: Self-pay | Admitting: Internal Medicine

## 2022-04-23 ENCOUNTER — Ambulatory Visit
Admission: RE | Admit: 2022-04-23 | Discharge: 2022-04-23 | Disposition: A | Discharge status: Home / Self Care | Payer: Medicare PPO | Source: Ambulatory Visit | Attending: Physician Assistant | Admitting: Physician Assistant

## 2022-04-23 DIAGNOSIS — M7989 Other specified soft tissue disorders: Secondary | ICD-10-CM

## 2022-05-02 ENCOUNTER — Emergency Department: Payer: Medicare PPO

## 2022-05-02 ENCOUNTER — Emergency Department
Admission: EM | Admit: 2022-05-02 | Discharge: 2022-05-02 | Disposition: A | Discharge status: Home / Self Care | Payer: Medicare PPO | Attending: Emergency Medicine | Admitting: Emergency Medicine

## 2022-05-02 ENCOUNTER — Encounter: Payer: Self-pay | Admitting: Emergency Medicine

## 2022-05-02 ENCOUNTER — Other Ambulatory Visit: Payer: Self-pay

## 2022-05-02 DIAGNOSIS — M25552 Pain in left hip: Secondary | ICD-10-CM | POA: Diagnosis not present

## 2022-05-02 DIAGNOSIS — I10 Essential (primary) hypertension: Secondary | ICD-10-CM | POA: Insufficient documentation

## 2022-05-02 DIAGNOSIS — M79675 Pain in left toe(s): Secondary | ICD-10-CM | POA: Insufficient documentation

## 2022-05-02 DIAGNOSIS — R55 Syncope and collapse: Secondary | ICD-10-CM | POA: Diagnosis present

## 2022-05-02 DIAGNOSIS — E119 Type 2 diabetes mellitus without complications: Secondary | ICD-10-CM | POA: Insufficient documentation

## 2022-05-02 DIAGNOSIS — D649 Anemia, unspecified: Secondary | ICD-10-CM | POA: Insufficient documentation

## 2022-05-02 DIAGNOSIS — W19XXXA Unspecified fall, initial encounter: Secondary | ICD-10-CM

## 2022-05-02 DIAGNOSIS — J45909 Unspecified asthma, uncomplicated: Secondary | ICD-10-CM | POA: Diagnosis not present

## 2022-05-02 LAB — BASIC METABOLIC PANEL
Anion gap: 9 (ref 5–15)
BUN: 20 mg/dL (ref 8–23)
CO2: 28 mmol/L (ref 22–32)
Calcium: 9.5 mg/dL (ref 8.9–10.3)
Chloride: 103 mmol/L (ref 98–111)
Creatinine, Ser: 0.93 mg/dL (ref 0.44–1.00)
GFR, Estimated: >60 mL/min (ref >60)
Glucose, Bld: 149 mg/dL — ABNORMAL HIGH (ref 70–99)
Potassium: 3.5 mmol/L (ref 3.5–5.1)
Sodium: 140 mmol/L (ref 135–145)

## 2022-05-02 LAB — CBC
HCT: 32.7 % — ABNORMAL LOW (ref 36.0–46.0)
Hemoglobin: 10.7 g/dL — ABNORMAL LOW (ref 12.0–15.0)
MCH: 30.1 pg (ref 26.0–34.0)
MCHC: 32.7 g/dL (ref 30.0–36.0)
MCV: 92.1 fL (ref 80.0–100.0)
Platelets: 301 10*3/uL (ref 150–400)
RBC: 3.55 MIL/uL — ABNORMAL LOW (ref 3.87–5.11)
RDW: 12.5 % (ref 11.5–15.5)
WBC: 8.7 10*3/uL (ref 4.0–10.5)
nRBC: 0 % (ref 0.0–0.2)

## 2022-05-02 NOTE — ED Notes (Signed)
Patient resting comfortably in stretcher. NAD noted. No needs expressed to RN.

## 2022-05-02 NOTE — ED Provider Notes (Signed)
[facility] Provider Note    Event Date/Time   First MD Initiated Contact with Patient 05/02/22 0700     (approximate)   History   Loss of Consciousness and Fall   HPI  Courtney Murphy is a 66 y.o. female  with PMHx asthma, DM, HTN, here with fall.  Patient states that she was on her porch around 4 AM.  She was sitting rocking chair.  She states she has difficulty sleeping and had been out there smoking.  She states that when she went out to the porch, she felt mildly lightheaded when she first stood up from the couch, but was able to walk to the chair and sit on it.  When she stood up from the chair, she felt briefly lightheaded then reportedly passed out, falling forward and onto her left side.  She reports pain in her left hip and toes since then.  She was able to get back up and ambulate since then.  Denies any significant headache.  She does not take blood thinners.  She has a history of recurrent lightheadedness with standing, states she also does not necessarily drink as much water as she normally should.  Denies any other complaints.  No focal numbness or weakness.  No chest pain or shortness of breath.  No leg swelling.      Physical Exam   Triage Vital Signs: ED Triage Vitals [05/02/22 0505]  Enc Vitals Group     BP 127/75     Pulse Rate 75     Resp 16     Temp 97.9 F (36.6 C)     Temp Source Oral     SpO2 99 %     Weight 178 lb (80.7 kg)     Height '5\' 4"'$  (1.626 m)     Head Circumference      Peak Flow      Pain Score      Pain Loc      Pain Edu?      Excl. in [place]?     Most recent vital signs: Vitals:   05/02/22 0645 05/02/22 0700  BP: 118/69 119/70  Pulse: 77 75  Resp: 14 13  Temp:    SpO2: 98% 95%     General: Awake, no distress.  CV:  Good peripheral perfusion.  Slight systolic murmur appreciated.   Resp:  Normal effort.  Lungs clear bilaterally. Abd:  No distention.  Soft, nontender, nondistended. Other:  Minimal  tenderness over the left upper medial clavicle, no deformity.  No bruising.  Minimal tenderness over the left greater trochanter as well as left dorsum of the foot.  No deformity.  Strength out of 5 bilateral upper and lower extremities.  Normal sensation to light touch.  Cranial nerves intact.   ED Results / Procedures / Treatments   Labs (all labs ordered are listed, but only abnormal results are displayed) Labs Reviewed  BASIC METABOLIC PANEL - Abnormal; Notable for the following components:      Result Value   Glucose, Bld 149 (*)    All other components within normal limits  CBC - Abnormal; Notable for the following components:   RBC 3.55 (*)    Hemoglobin 10.7 (*)    HCT 32.7 (*)    All other components within normal limits  URINALYSIS, ROUTINE W REFLEX MICROSCOPIC  CBG MONITORING, ED     EKG Normal sinus rhythm, ventricular rate 77.  PR 144, QRS 84, QTc 439.  No acute ST elevations or depressions.  No EKG evidence of acute ischemia or infarct.   RADIOLOGY CT head/C-spine: No acute intracranial abnormality, degenerative changes but no fracture Detail left: Chronic bony findings, no acute fracture DG pelvis left: No acute fracture of the left hip   I also independently reviewed and agree with radiologist interpretations.   PROCEDURES:  Critical Care performed: No  .1-3 Lead EKG Interpretation Performed by: Duffy Bruce, MD Authorized by: Duffy Bruce, MD     Interpretation: normal     ECG rate:  70-80   ECG rate assessment: normal     Rhythm: sinus rhythm     Ectopy: none     Conduction: normal   Comments:     Indication: Syncope    MEDICATIONS ORDERED IN ED: Medications - No data to display   IMPRESSION / MDM / [place] / ED COURSE  I reviewed the triage vital signs and the nursing notes.                               The patient is on the cardiac monitor to evaluate for evidence of arrhythmia and/or significant heart rate  changes.   Ddx:  Differential includes the following, with pertinent life- or limb-threatening emergencies considered:  Orthostatic syncope, vasovagal episode, arrhythmia, anemia, dehydration, possibly polypharmacy  Patient's presentation is most consistent with acute presentation with potential threat to life or bodily function.   MDM:  66 year old female with history of asthma, hypertension, diabetes, here with syncope and fall.  Differential as above, including potential life-threatening illness such as arrhythmia, anemia, polypharmacy.  Patient is hemodynamically stable here, feels better after p.o. hydration.  She is no longer orthostatic.  She has been able to ambulate without difficulty.  No focal neurological deficits.  CT of the head and C-spine reviewed by me, showed no acute abnormality.  Plain films of areas of tenderness are negative, including the left hip and left foot.  Chest x-ray is clear.  CBC shows mild anemia, but this is near her baseline.  BMP unremarkable with no significant AKI.  Patient ambulatory in the ED and asymptomatic now.  No ectopy noted on telemetry.  EKG nonischemic.  No evidence of high risk syncopal features.  She has a history of recurrent orthostasis in similar settings.  She reports that she is borderline hypotensive at baseline.  I reviewed her medications, which include hydrocodone, Flexeril, gabapentin, lisinopril.Will advise that she cuts her lisinopril in half for the next several days. She also f/u with Dr. Rogue Bussing with Heme-Onc for chronic anemia and antiphospholipid antibody.  Patient has no hypoxia, tachycardia, tachypnea, or signs of PE.  She did not receive Venofer at her most recent visit in April.  Her hemoglobin is very slightly below her previous values.  We will have her call Dr. Burlene Arnt to discuss as well.  No apparent emergent indication for admission at this time.  MEDICATIONS GIVEN IN ED: Medications - No data to display   Consults:      EMR reviewed  Reviewed prior records, including office visits with Dr. Rogue Bussing of oncology, as well as Dr. Erlene Quan with urology     FINAL CLINICAL IMPRESSION(S) / ED DIAGNOSES   Final diagnoses:  Syncope, unspecified syncope type  Fall, initial encounter     Rx / DC Orders   ED Discharge Orders     None        Note:  This document was prepared using Dragon voice recognition software and may include unintentional dictation errors.   Duffy Bruce, MD 05/02/22 [phone]

## 2022-05-02 NOTE — ED Triage Notes (Signed)
  Patient BIB EMS for unwitnessed fall that occurred around 0400.  Patient states she was sitting in a rocking chair and smoking a cigarette in her garage.  Patient went to stand up and realized she hit the floor.  Patient states she had no LOC but she does not remember falling.  Patient complains of L hip pain and headache above L eye.  Pain 6/10, aching/throbbing in L hip and head.

## 2022-05-02 NOTE — ED Triage Notes (Signed)
FIRST NURSE NOTE:  Pt arrived via [facility] from home with reports of unwitnessed fall, no LOC, hit head, pt has mark on head, C/o L hip pain and L toe pain.  Pt not on blood thinners.  Pt ambulatory with EMS on scene, A&O x 4  P-76 99%RA 140/72

## 2022-05-02 NOTE — Discharge Instructions (Signed)
For the next 2-3 days:  HOLD or cut your Lisinopril 20 mg dose in half to 10 mg.   Drink at least 6-8 glasses of water daily.  Be cautious when taking your Neurontin and hydrocodone, as this can contribute to dizziness with standing.  Your hemoglobin was slightly below its baseline.  I recommend calling Dr. Burlene Arnt with hematology to discuss whether you need an iron infusion sooner rather than later.  Return to the ER as needed

## 2022-05-12 ENCOUNTER — Encounter: Payer: Self-pay | Admitting: Internal Medicine

## 2022-05-13 ENCOUNTER — Ambulatory Visit (INDEPENDENT_AMBULATORY_CARE_PROVIDER_SITE_OTHER): Payer: Medicare PPO | Admitting: Podiatry

## 2022-05-13 DIAGNOSIS — M109 Gout, unspecified: Secondary | ICD-10-CM

## 2022-05-14 LAB — CBC WITH DIFFERENTIAL/PLATELET
Basophils Absolute: 0.1 10*3/uL (ref 0.0–0.2)
Basos: 1 %
EOS (ABSOLUTE): 0.7 10*3/uL — ABNORMAL HIGH (ref 0.0–0.4)
Eos: 6 %
Hematocrit: 31.7 % — ABNORMAL LOW (ref 34.0–46.6)
Hemoglobin: 10.9 g/dL — ABNORMAL LOW (ref 11.1–15.9)
Immature Grans (Abs): 0 10*3/uL (ref 0.0–0.1)
Immature Granulocytes: 0 %
Lymphocytes Absolute: 3.6 10*3/uL — ABNORMAL HIGH (ref 0.7–3.1)
Lymphs: 31 %
MCH: 30.4 pg (ref 26.6–33.0)
MCHC: 34.4 g/dL (ref 31.5–35.7)
MCV: 89 fL (ref 79–97)
Monocytes Absolute: 0.6 10*3/uL (ref 0.1–0.9)
Monocytes: 5 %
Neutrophils Absolute: 6.5 10*3/uL (ref 1.4–7.0)
Neutrophils: 57 %
Platelets: 286 10*3/uL (ref 150–450)
RBC: 3.58 x10E6/uL — ABNORMAL LOW (ref 3.77–5.28)
RDW: 11.5 % — ABNORMAL LOW (ref 11.7–15.4)
WBC: 11.5 10*3/uL — ABNORMAL HIGH (ref 3.4–10.8)

## 2022-05-14 LAB — BASIC METABOLIC PANEL
BUN/Creatinine Ratio: 23 (ref 12–28)
BUN: 17 mg/dL (ref 8–27)
CO2: 25 mmol/L (ref 20–29)
Calcium: 9.7 mg/dL (ref 8.7–10.3)
Chloride: 103 mmol/L (ref 96–106)
Creatinine, Ser: 0.75 mg/dL (ref 0.57–1.00)
Glucose: 125 mg/dL — ABNORMAL HIGH (ref 70–99)
Potassium: 4.2 mmol/L (ref 3.5–5.2)
Sodium: 143 mmol/L (ref 134–144)
eGFR: 88 mL/min/{1.73_m2} (ref >59)

## 2022-05-14 LAB — C-REACTIVE PROTEIN: CRP: 2 mg/L (ref 0–10)

## 2022-05-14 LAB — URIC ACID: Uric Acid: 6.8 mg/dL (ref 3.0–7.2)

## 2022-05-14 MED ORDER — COLCHICINE 0.6 MG PO TABS: ORAL_TABLET | ORAL | 2 refills | Status: DC

## 2022-05-16 ENCOUNTER — Encounter: Payer: Self-pay | Admitting: Podiatry

## 2022-05-16 NOTE — Progress Notes (Signed)
  Subjective:  Patient ID: Courtney Murphy, female    DOB: [birth year],  MRN: [id number]  Chief Complaint  Patient presents with   Toe Pain     left foot swollen and painful    66 y.o. female presents with the above complaint. History confirmed with patient.  Toe has been swollen red and occasionally painful for few weeks now.  She had x-rays done about a week and a half ago  Objective:  Physical Exam: warm, good capillary refill, no trophic changes or ulcerative lesions, normal DP and PT pulses, and she has gross peripheral neuropathy, edema and erythema around the first MTPJ, no open ulcerations drainage fluctuance or areas to suggest abscess, erythema is localized to the first MTPJ.  I reviewed her left foot radiographs from 05/02/2022 there are no acute osseous abnormalities Assessment:   1. 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  Suspect she is having a gout flare.  I ordered a uric acid level on her CBC and CRP and BMP.  Her uric acid was elevated at 6.8 but still within normal range.  Clinically this still fits with gout and he may be on the decreasing side coming down.  I still recommend treatment with colchicine empirically.  I will see her back in a few weeks for follow-up.  Currently no signs of infection worsening Charcot or breakdown.  Swelling and erythema was localized to the first MTPJ.  Return in about 26 days (around 06/08/2022) for f/u on left foot pain and swelling.

## 2022-05-19 ENCOUNTER — Encounter: Payer: Self-pay | Admitting: Internal Medicine

## 2022-05-20 ENCOUNTER — Telehealth: Payer: Self-pay | Admitting: Podiatry

## 2022-05-20 ENCOUNTER — Encounter: Payer: Self-pay | Admitting: Internal Medicine

## 2022-05-20 MED ORDER — METHYLPREDNISOLONE 4 MG PO TBPK: ORAL_TABLET | ORAL | 0 refills | Status: DC

## 2022-05-20 NOTE — Telephone Encounter (Signed)
Patient called stated that she seen Dr. Sherryle Lis last week and she was prescribed some medication for Gout. She states that the medication is not working and the swelling has not gone down.

## 2022-07-08 ENCOUNTER — Encounter: Payer: Self-pay | Admitting: Internal Medicine

## 2022-08-06 ENCOUNTER — Encounter: Payer: Self-pay | Admitting: Emergency Medicine

## 2022-08-06 ENCOUNTER — Emergency Department
Admission: EM | Admit: 2022-08-06 | Discharge: 2022-08-06 | Disposition: A | Discharge status: Home / Self Care | Payer: Medicare PPO | Attending: Emergency Medicine | Admitting: Emergency Medicine

## 2022-08-06 ENCOUNTER — Other Ambulatory Visit: Payer: Self-pay

## 2022-08-06 DIAGNOSIS — M79606 Pain in leg, unspecified: Secondary | ICD-10-CM | POA: Diagnosis present

## 2022-08-06 DIAGNOSIS — Z5321 Procedure and treatment not carried out due to patient leaving prior to being seen by health care provider: Secondary | ICD-10-CM | POA: Insufficient documentation

## 2022-08-06 LAB — COMPREHENSIVE METABOLIC PANEL
ALT: 22 U/L (ref 0–44)
AST: 23 U/L (ref 15–41)
Albumin: 3.7 g/dL (ref 3.5–5.0)
Alkaline Phosphatase: 113 U/L (ref 38–126)
Anion gap: 9 (ref 5–15)
BUN: 17 mg/dL (ref 8–23)
CO2: 27 mmol/L (ref 22–32)
Calcium: 9.4 mg/dL (ref 8.9–10.3)
Chloride: 104 mmol/L (ref 98–111)
Creatinine, Ser: 1.04 mg/dL — ABNORMAL HIGH (ref 0.44–1.00)
GFR, Estimated: 59 mL/min — ABNORMAL LOW (ref >60)
Glucose, Bld: 105 mg/dL — ABNORMAL HIGH (ref 70–99)
Potassium: 4.1 mmol/L (ref 3.5–5.1)
Sodium: 140 mmol/L (ref 135–145)
Total Bilirubin: 0.6 mg/dL (ref 0.3–1.2)
Total Protein: 7.1 g/dL (ref 6.5–8.1)

## 2022-08-06 LAB — MAGNESIUM: Magnesium: 1.9 mg/dL (ref 1.7–2.4)

## 2022-08-06 LAB — CBC WITH DIFFERENTIAL/PLATELET
Abs Immature Granulocytes: 0.05 10*3/uL (ref 0.00–0.07)
Basophils Absolute: 0 10*3/uL (ref 0.0–0.1)
Basophils Relative: 0 %
Eosinophils Absolute: 0.1 10*3/uL (ref 0.0–0.5)
Eosinophils Relative: 1 %
HCT: 35 % — ABNORMAL LOW (ref 36.0–46.0)
Hemoglobin: 11.3 g/dL — ABNORMAL LOW (ref 12.0–15.0)
Immature Granulocytes: 1 %
Lymphocytes Relative: 29 %
Lymphs Abs: 3.3 10*3/uL (ref 0.7–4.0)
MCH: 29.4 pg (ref 26.0–34.0)
MCHC: 32.3 g/dL (ref 30.0–36.0)
MCV: 90.9 fL (ref 80.0–100.0)
Monocytes Absolute: 0.6 10*3/uL (ref 0.1–1.0)
Monocytes Relative: 5 %
Neutro Abs: 7.1 10*3/uL (ref 1.7–7.7)
Neutrophils Relative %: 64 %
Platelets: 309 10*3/uL (ref 150–400)
RBC: 3.85 MIL/uL — ABNORMAL LOW (ref 3.87–5.11)
RDW: 13.4 % (ref 11.5–15.5)
WBC: 11.1 10*3/uL — ABNORMAL HIGH (ref 4.0–10.5)
nRBC: 0 % (ref 0.0–0.2)

## 2022-08-06 LAB — CK: Total CK: 81 U/L (ref 38–234)

## 2022-08-06 NOTE — ED Triage Notes (Signed)
Pt to triage via w/c with no distress noted; pt reports "legs cramping for several days and pressure in hips"; pt st she had recent labwork with her PCP and is concerned it was abnormal but has not discussed it with PCP as of yet

## 2022-09-25 ENCOUNTER — Ambulatory Visit: Payer: PRIVATE HEALTH INSURANCE | Admitting: Internal Medicine

## 2022-09-25 ENCOUNTER — Ambulatory Visit: Payer: PRIVATE HEALTH INSURANCE

## 2022-09-25 ENCOUNTER — Other Ambulatory Visit: Payer: PRIVATE HEALTH INSURANCE

## 2022-09-28 MED FILL — Iron Sucrose Inj 20 MG/ML (Fe Equiv): INTRAVENOUS | Qty: 10 | Status: AC

## 2022-09-29 ENCOUNTER — Inpatient Hospital Stay: Payer: PRIVATE HEALTH INSURANCE

## 2022-09-29 ENCOUNTER — Inpatient Hospital Stay: Payer: PRIVATE HEALTH INSURANCE | Admitting: Internal Medicine

## 2022-10-19 ENCOUNTER — Inpatient Hospital Stay: Payer: PRIVATE HEALTH INSURANCE

## 2022-10-19 ENCOUNTER — Inpatient Hospital Stay: Payer: PRIVATE HEALTH INSURANCE | Admitting: Internal Medicine

## 2022-11-05 ENCOUNTER — Inpatient Hospital Stay: Payer: Medicare PPO | Attending: Internal Medicine

## 2022-11-05 ENCOUNTER — Inpatient Hospital Stay: Payer: Medicare PPO

## 2022-11-05 ENCOUNTER — Inpatient Hospital Stay (HOSPITAL_BASED_OUTPATIENT_CLINIC_OR_DEPARTMENT_OTHER): Payer: Medicare PPO | Admitting: Internal Medicine

## 2022-11-05 ENCOUNTER — Encounter: Payer: Self-pay | Admitting: Internal Medicine

## 2022-11-05 ENCOUNTER — Other Ambulatory Visit: Payer: Self-pay

## 2022-11-05 VITALS — BP 119/79 | HR 82 | Temp 96.0°F | Resp 17 | Wt 174.0 lb

## 2022-11-05 DIAGNOSIS — D6861 Antiphospholipid syndrome: Secondary | ICD-10-CM | POA: Insufficient documentation

## 2022-11-05 DIAGNOSIS — D509 Iron deficiency anemia, unspecified: Secondary | ICD-10-CM | POA: Diagnosis not present

## 2022-11-05 DIAGNOSIS — E611 Iron deficiency: Secondary | ICD-10-CM

## 2022-11-05 DIAGNOSIS — E538 Deficiency of other specified B group vitamins: Secondary | ICD-10-CM | POA: Diagnosis not present

## 2022-11-05 DIAGNOSIS — Z9884 Bariatric surgery status: Secondary | ICD-10-CM | POA: Diagnosis not present

## 2022-11-05 DIAGNOSIS — Z86711 Personal history of pulmonary embolism: Secondary | ICD-10-CM | POA: Diagnosis not present

## 2022-11-05 DIAGNOSIS — K909 Intestinal malabsorption, unspecified: Secondary | ICD-10-CM

## 2022-11-05 DIAGNOSIS — R760 Raised antibody titer: Secondary | ICD-10-CM

## 2022-11-05 LAB — BASIC METABOLIC PANEL
Anion gap: 10 (ref 5–15)
BUN: 18 mg/dL (ref 8–23)
CO2: 26 mmol/L (ref 22–32)
Calcium: 9.1 mg/dL (ref 8.9–10.3)
Chloride: 103 mmol/L (ref 98–111)
Creatinine, Ser: 0.93 mg/dL (ref 0.44–1.00)
GFR, Estimated: >60 mL/min (ref >60)
Glucose, Bld: 111 mg/dL — ABNORMAL HIGH (ref 70–99)
Potassium: 4 mmol/L (ref 3.5–5.1)
Sodium: 139 mmol/L (ref 135–145)

## 2022-11-05 LAB — CBC WITH DIFFERENTIAL/PLATELET
Abs Immature Granulocytes: 0.03 10*3/uL (ref 0.00–0.07)
Basophils Absolute: 0 10*3/uL (ref 0.0–0.1)
Basophils Relative: 0 %
Eosinophils Absolute: 0.2 10*3/uL (ref 0.0–0.5)
Eosinophils Relative: 3 %
HCT: 33.8 % — ABNORMAL LOW (ref 36.0–46.0)
Hemoglobin: 11.2 g/dL — ABNORMAL LOW (ref 12.0–15.0)
Immature Granulocytes: 0 %
Lymphocytes Relative: 34 %
Lymphs Abs: 3.1 10*3/uL (ref 0.7–4.0)
MCH: 30.1 pg (ref 26.0–34.0)
MCHC: 33.1 g/dL (ref 30.0–36.0)
MCV: 90.9 fL (ref 80.0–100.0)
Monocytes Absolute: 0.5 10*3/uL (ref 0.1–1.0)
Monocytes Relative: 6 %
Neutro Abs: 5 10*3/uL (ref 1.7–7.7)
Neutrophils Relative %: 57 %
Platelets: 294 10*3/uL (ref 150–400)
RBC: 3.72 MIL/uL — ABNORMAL LOW (ref 3.87–5.11)
RDW: 12.6 % (ref 11.5–15.5)
WBC: 8.9 10*3/uL (ref 4.0–10.5)
nRBC: 0 % (ref 0.0–0.2)

## 2022-11-05 LAB — FERRITIN: Ferritin: 15 ng/mL (ref 11–307)

## 2022-11-05 LAB — IRON AND TIBC
Iron: 57 ug/dL (ref 28–170)
Saturation Ratios: 12 % (ref 10.4–31.8)
TIBC: 486 ug/dL — ABNORMAL HIGH (ref 250–450)
UIBC: 429 ug/dL

## 2022-11-05 NOTE — Progress Notes (Signed)
[place] NOTE  Patient Care Team: Audelia Acton, MD as PCP - General (Internal Medicine)  CHIEF COMPLAINTS/PURPOSE OF CONSULTATION: DVT/PE  # PE [> 10-15 years] x 6 months of coumadin [3-4 months prior TAH]; No provoking causes; No Family Hx of blood clots; NO BCPs; no miscarriages;OCT 2021 [incidental rheumatologic work-up]-lupus anticoagulant-NEG; Beta2 IgG- 56 [H]; anticardiolipin-IgM -indeterminate [>13]; REPEAT APS WORK-UP JAN 2022-anticardiolipin IgG 55; rest of the work-up unremarkable-would not recommend anticoagulation  # Gastric By pass [lost 80 pounds; 2011]; JAN 2022-iron deficiency noted; IV Venofer  # Joint pains [Dr.Patel; [facility]] Oncology History  Antiphospholipid antibody positive     HISTORY OF PRESENTING ILLNESS: Alone ambulating independently.  Courtney Murphy 66 y.o.  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  Patient had sacroiliac joint fusion 4 weeks ago.  She was having back discomfort and preferred standing.  Denies any bleeding.  She has not been able to keep up with her B12 injections.  Reports compliance with Bari melts.  Review of Systems  Constitutional:  Positive for malaise/fatigue. Negative for chills, diaphoresis, fever and weight loss.  HENT:  Negative for nosebleeds and sore throat.   Eyes:  Negative for double vision.  Respiratory:  Negative for cough, hemoptysis, sputum production, shortness of breath and wheezing.   Cardiovascular:  Negative for chest pain, palpitations, orthopnea and leg swelling.  Gastrointestinal:  Negative for abdominal pain, blood in stool, constipation, diarrhea, heartburn, melena, nausea and vomiting.  Genitourinary:  Negative for dysuria, frequency and urgency.  Musculoskeletal:  Positive for back pain and joint pain.  Skin: Negative.  Negative for itching and  rash.  Neurological:  Negative for dizziness, tingling, focal weakness, weakness and headaches.  Endo/Heme/Allergies:  Bruises/bleeds easily.  Psychiatric/Behavioral:  Negative for depression. The patient is not nervous/anxious and does not have insomnia.      MEDICAL HISTORY:  Past Medical History:  Diagnosis Date   Acid reflux 10/11/2015   Anemia    RECEIVES IRON INFUSIONS   Arthritis    Asthma    WELL CONTROLLED   Depression    Diabetes mellitus without complication ([place])    Fatty liver    Gout    History of kidney stones    History of methicillin resistant staphylococcus aureus (MRSA) 2016   Hypertension    H/O BEEN OFF BP MEDS FOR 1 YEAR   Hypothyroidism    Kidney stone    Knee pain    Left   Pulmonary emboli ([place]) 2011   Restless leg syndrome    Shoulder pain     SURGICAL HISTORY: Past Surgical History:  Procedure Laterality Date   ABDOMINAL HYSTERECTOMY     BACK SURGERY     BREAST REDUCTION SURGERY     CATARACT EXTRACTION W/PHACO Left 07/28/2017   Procedure: CATARACT EXTRACTION PHACO AND INTRAOCULAR LENS PLACEMENT ([place]) diabetic Left;  Surgeon: Leandrew Koyanagi, MD;  Location: [facility];  Service: Ophthalmology;  Laterality: Left;  Diabetic   CESAREAN SECTION  1990   CHOLECYSTECTOMY     CYSTOSCOPY/URETEROSCOPY/HOLMIUM LASER/STENT PLACEMENT Left 09/08/2017   Procedure: CYSTOSCOPY/URETEROSCOPY/HOLMIUM LASER/STENT EXCHANGE;  Surgeon: Hollice Espy, MD;  Location: [facility];  Service: Urology;  Laterality: Left;   CYSTOSCOPY/URETEROSCOPY/HOLMIUM LASER/STENT PLACEMENT Left 12/14/2018   Procedure: CYSTOSCOPY/URETEROSCOPY/HOLMIUM LASER/STENT PLACEMENT;  Surgeon: Hollice Espy, MD;  Location: [facility];  Service: Urology;  Laterality: Left;   FOOT SURGERY  GASTRIC BYPASS     IR NEPHROSTOMY PLACEMENT LEFT  08/17/2017   KNEE ARTHROSCOPY Left    KNEE ARTHROSCOPY Left 07/19/2015   Procedure: ARTHROSCOPY KNEE;  Surgeon: Leanor Kail, MD;  Location:  [facility];  Service: Orthopedics;  Laterality: Left;   NEPHROLITHOTOMY Left 08/17/2017   Procedure: NEPHROLITHOTOMY PERCUTANEOUS WITH HOLMIUM LASER;  Surgeon: Hollice Espy, MD;  Location: [facility];  Service: Urology;  Laterality: Left;   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/2021   THUMB ARTHROSCOPY     TONSILLECTOMY      SOCIAL HISTORY: Social History   Socioeconomic History   Marital status: Widowed    Spouse name: Not on file   Number of children: Not on file   Years of education: Not on file   Highest education level: Not on file  Occupational History   Not on file  Tobacco Use   Smoking status: Former    Packs/day: 0.50    Years: 4.00    Total pack years: 2.00    Types: Cigarettes    Quit date: 04/14/2015    Years since quitting: 7.5   Smokeless tobacco: Never  Vaping Use   Vaping Use: Never used  Substance and Sexual Activity   Alcohol use: Not Currently    Comment: RARE   Drug use: No   Sexual activity: Never  Other Topics Concern   Not on file  Social History Narrative   Quit smoking > 30 years ago; rare alcohol. Used to worked at [employer]; receptionist at [employer]. Lives in [place]; with dog.    Social Determinants of Health   Financial Resource Strain: Not on file  Food Insecurity: Not on file  Transportation Needs: Not on file  Physical Activity: Not on file  Stress: Not on file  Social Connections: Not on file  Intimate Partner Violence: Not on file    FAMILY HISTORY: Family History  Problem Relation Age of Onset   Cancer Mother        breast   Heart disease Father    Diabetes Father    Hypertension Father    Cancer Maternal Grandmother    Cancer Maternal Grandfather    Heart disease Paternal Grandmother    Heart disease Paternal Grandfather    Cancer Maternal Aunt    Cancer Maternal Uncle    Heart disease Paternal Aunt    Heart disease Paternal Uncle    Kidney cancer Neg Hx    Bladder Cancer Neg Hx      ALLERGIES:  is allergic to sulfasalazine, nsaids, sulfa antibiotics, and penicillins.  MEDICATIONS:  Current Outpatient Medications  Medication Sig Dispense Refill   atomoxetine (STRATTERA) 40 MG capsule      atorvastatin (LIPITOR) 20 MG tablet Take 20 mg by mouth every morning.      colchicine 0.6 MG tablet Take 1.'2mg'$  (2 tablets) then 0.'6mg'$  (1 tablet) 1 hour after. Then, take 1 tablet every day for 7 days. 10 tablet 2   cyanocobalamin (,VITAMIN B-12,) 1000 MCG/ML injection Inject 1,000 mcg into the muscle once a week.     cyclobenzaprine (FLEXERIL) 10 MG tablet Take 10 mg by mouth every 8 (eight) hours.     Dulaglutide 0.75 MG/0.5ML SOPN Inject 0.75 mg into the skin every Thursday.      DULoxetine HCl 40 MG CPEP Take 1 capsule by mouth daily.     gabapentin (NEURONTIN) 300 MG capsule Take 300 mg by mouth at bedtime.     HYDROcodone-acetaminophen (NORCO/VICODIN) 5-325 MG  tablet Take 1 tablet by mouth 3 (three) times daily as needed.     levothyroxine (SYNTHROID, LEVOTHROID) 100 MCG tablet Take 100 mcg by mouth daily before breakfast.     metFORMIN (GLUCOPHAGE-XR) 750 MG 24 hr tablet Take by mouth.     pramipexole (MIRAPEX) 0.5 MG tablet Take 0.5-1 mg by mouth at bedtime.      No current facility-administered medications for this visit.      Marland Kitchen  PHYSICAL EXAMINATION:  There were no vitals filed for this visit.  There were no vitals filed for this visit.   Physical Exam HENT:     Head: Normocephalic and atraumatic.     Mouth/Throat:     Pharynx: No oropharyngeal exudate.  Eyes:     Pupils: Pupils are equal, round, and reactive to light.  Cardiovascular:     Rate and Rhythm: Normal rate and regular rhythm.  Pulmonary:     Effort: Pulmonary effort is normal. No respiratory distress.     Breath sounds: Normal breath sounds. No wheezing.  Abdominal:     General: Bowel sounds are normal. There is no distension.     Palpations: Abdomen is soft. There is no mass.      Tenderness: There is no abdominal tenderness. There is no guarding or rebound.  Musculoskeletal:        General: No tenderness. Normal range of motion.     Cervical back: Normal range of motion and neck supple.  Skin:    General: Skin is warm.     Comments: Chronic bruising noted on the upper extremities.  No bruises on the trunk or lower extremities.  Neurological:     Mental Status: She is alert and oriented to person, place, and time.  Psychiatric:        Mood and Affect: Affect normal.      LABORATORY DATA:  I have reviewed the data as listed Lab Results  Component Value Date   WBC 8.9 11/05/2022   HGB 11.2 (L) 11/05/2022   HCT 33.8 (L) 11/05/2022   MCV 90.9 11/05/2022   PLT 294 11/05/2022   Recent Labs    01/18/22 0328 01/25/22 0710 03/27/22 1412 05/02/22 0506 05/13/22 1431 08/06/22 0604  NA 137   < > 139 140 143 140  K 3.7   < > 3.9 3.5 4.2 4.1  CL 101   < > 103 103 103 104  CO2 26   < > '28 28 25 27  '$ GLUCOSE 207*   < > 98 149* 125* 105*  BUN 20   < > '16 20 17 17  '$ CREATININE 0.87   < > 0.81 0.93 0.75 1.04*  CALCIUM 9.4   < > 9.3 9.5 9.7 9.4  GFRNONAA >60   < > >60 >60  --  59*  PROT 7.0  --  7.4  --   --  7.1  ALBUMIN 3.8  --  4.1  --   --  3.7  AST 17  --  21  --   --  23  ALT 18  --  18  --   --  22  ALKPHOS 122  --  111  --   --  113  BILITOT 0.3  --  0.6  --   --  0.6   < > = values in this interval not displayed.     RADIOGRAPHIC STUDIES: I have personally reviewed the radiological images as listed and agreed with the findings in the report.  No results found.   ASSESSMENT & PLAN:  Antiphospholipid antibody positive #Iron deficiency anemia [ gastric bypass-]- Last IV Venofer in January 2022.  Hemoglobin today is 11.3.  Iron studies are pending.  I will call the patient tomorrow if she needs Venofer.   # b12 def-B12 level from August 2023 low at 187.  She is not taking B12 injections regularly.  She will pick up new prescription from primary and  will start monthly injections.   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  Prior history of PE 10-15 years ago; currently not on  anticoagulation.  Not recommend any anticoagulation just based on abnormal serology-especially if lupus anticoagulant is negative. STABLE.    #s/p sacroiliac joint fusion.   # DISPOSITION: # HOLD Venofer today # follow up in 6 months-MD; labs- cbc/bmp/iiton studies/ferritin- Nilda Riggs venofer-Dr.B   All questions were answered. The patient knows to call the clinic with any problems, questions or concerns.   Jane Canary, MD 11/05/2022 2:03 PM

## 2022-11-05 NOTE — Progress Notes (Signed)
Patient here for oncology follow-up appointment, concerns of back pain & fatigue

## 2022-11-13 ENCOUNTER — Encounter: Payer: Self-pay | Admitting: Internal Medicine

## 2022-12-08 ENCOUNTER — Telehealth: Payer: Self-pay | Admitting: *Deleted

## 2022-12-08 NOTE — Telephone Encounter (Signed)
Patient called stating PCP is concerned that she did not get IV Iron in December and so she is asking to be scheduled for it now. She said her PCP was going to send you a message   Iron and TIBC Order: [id number] Status: Final result     Visible to patient: Yes (seen)     Next appt: 05/07/2023 at 12:45 PM in Oncology ([facility])     Dx: Antiphospholipid antibody positive   0 Result Notes       Component Ref Range & Units 1 mo ago 8 mo ago 1 yr ago  Iron 28 - 170 ug/dL 57 67 72  TIBC 250 - 450 ug/dL 486 High  473 High  512 High   Saturation Ratios 10.4 - 31.8 % '12 14 14  '$ UIBC ug/dL 429 406 CM 440 CM  Comment: Performed at [facility], [address] [place], [place] [address]  Resulting Agency  [facility]         Specimen Collected: 11/05/22 13:43 Last Resulted: 11/05/22 15:37

## 2022-12-09 ENCOUNTER — Other Ambulatory Visit: Payer: Self-pay | Admitting: Internal Medicine

## 2022-12-09 ENCOUNTER — Encounter: Payer: Self-pay | Admitting: Emergency Medicine

## 2022-12-09 ENCOUNTER — Emergency Department
Admission: EM | Admit: 2022-12-09 | Discharge: 2022-12-09 | Disposition: A | Discharge status: Home / Self Care | Payer: Medicare PPO | Attending: Emergency Medicine | Admitting: Emergency Medicine

## 2022-12-09 DIAGNOSIS — J45909 Unspecified asthma, uncomplicated: Secondary | ICD-10-CM | POA: Insufficient documentation

## 2022-12-09 DIAGNOSIS — E119 Type 2 diabetes mellitus without complications: Secondary | ICD-10-CM | POA: Diagnosis not present

## 2022-12-09 DIAGNOSIS — I10 Essential (primary) hypertension: Secondary | ICD-10-CM | POA: Insufficient documentation

## 2022-12-09 DIAGNOSIS — M62838 Other muscle spasm: Secondary | ICD-10-CM | POA: Insufficient documentation

## 2022-12-09 DIAGNOSIS — E039 Hypothyroidism, unspecified: Secondary | ICD-10-CM | POA: Insufficient documentation

## 2022-12-09 LAB — COMPREHENSIVE METABOLIC PANEL
ALT: 18 U/L (ref 0–44)
AST: 20 U/L (ref 15–41)
Albumin: 3.7 g/dL (ref 3.5–5.0)
Alkaline Phosphatase: 157 U/L — ABNORMAL HIGH (ref 38–126)
Anion gap: 8 (ref 5–15)
BUN: 16 mg/dL (ref 8–23)
CO2: 28 mmol/L (ref 22–32)
Calcium: 9.3 mg/dL (ref 8.9–10.3)
Chloride: 103 mmol/L (ref 98–111)
Creatinine, Ser: 0.76 mg/dL (ref 0.44–1.00)
GFR, Estimated: >60 mL/min (ref >60)
Glucose, Bld: 124 mg/dL — ABNORMAL HIGH (ref 70–99)
Potassium: 3.8 mmol/L (ref 3.5–5.1)
Sodium: 139 mmol/L (ref 135–145)
Total Bilirubin: 0.6 mg/dL (ref 0.3–1.2)
Total Protein: 6.9 g/dL (ref 6.5–8.1)

## 2022-12-09 LAB — CBC WITH DIFFERENTIAL/PLATELET
Abs Immature Granulocytes: 0.04 10*3/uL (ref 0.00–0.07)
Basophils Absolute: 0 10*3/uL (ref 0.0–0.1)
Basophils Relative: 0 %
Eosinophils Absolute: 0.2 10*3/uL (ref 0.0–0.5)
Eosinophils Relative: 2 %
HCT: 35.4 % — ABNORMAL LOW (ref 36.0–46.0)
Hemoglobin: 11.4 g/dL — ABNORMAL LOW (ref 12.0–15.0)
Immature Granulocytes: 0 %
Lymphocytes Relative: 34 %
Lymphs Abs: 3.9 10*3/uL (ref 0.7–4.0)
MCH: 29.5 pg (ref 26.0–34.0)
MCHC: 32.2 g/dL (ref 30.0–36.0)
MCV: 91.5 fL (ref 80.0–100.0)
Monocytes Absolute: 0.6 10*3/uL (ref 0.1–1.0)
Monocytes Relative: 5 %
Neutro Abs: 6.8 10*3/uL (ref 1.7–7.7)
Neutrophils Relative %: 59 %
Platelets: 320 10*3/uL (ref 150–400)
RBC: 3.87 MIL/uL (ref 3.87–5.11)
RDW: 12.5 % (ref 11.5–15.5)
WBC: 11.6 10*3/uL — ABNORMAL HIGH (ref 4.0–10.5)
nRBC: 0 % (ref 0.0–0.2)

## 2022-12-09 NOTE — Discharge Instructions (Signed)
Your blood work was reassuring.  You can continue to take your pain medication and muscle relaxant if your symptoms recur.

## 2022-12-09 NOTE — ED Provider Notes (Signed)
[facility] Provider Note    Event Date/Time   First MD Initiated Contact with Patient 12/09/22 [phone]     (approximate)   History   Spasms   HPI  Courtney Murphy is a 67 y.o. female past medical history of restless leg syndrome, diabetes, depression who presents with muscle spasms.  Patient woke up around 2 AM with muscle cramping and spasm in the bilateral legs.  Eval both upper and lower legs.  Denies any preceding trauma.  Has had similar in the past but not frequently.  Does have history of restless legs but says this feels different.  She took her hydrocodone and methocarbamol that she is prescribed for chronic pain.  Since being in the ED her symptoms have largely resolved.  Denies any other extremity pain.  Denies numbness tingling.  Denies any nausea vomiting or other recent GI symptoms.  Patient is followed by pain management.     Past Medical History:  Diagnosis Date   Acid reflux 10/11/2015   Anemia    RECEIVES IRON INFUSIONS   Arthritis    Asthma    WELL CONTROLLED   Depression    Diabetes mellitus without complication ([place])    Fatty liver    Gout    History of kidney stones    History of methicillin resistant staphylococcus aureus (MRSA) 2016   Hypertension    H/O BEEN OFF BP MEDS FOR 1 YEAR   Hypothyroidism    Kidney stone    Knee pain    Left   Pulmonary emboli ([place]) 2011   Restless leg syndrome    Shoulder pain     Patient Active Problem List   Diagnosis Date Noted   Iron malabsorption 01/06/2021   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   Anemia 07/17/2020   Asthma 07/17/2020   Degeneration of lumbar intervertebral disc 07/17/2020   Degenerative joint disease of hand 07/17/2020   Dyslipidemia 07/17/2020   Hip pain 07/17/2020   History of lumbar laminectomy 07/17/2020   Hypertensive disorder 07/17/2020   Joint pain 07/17/2020   Depression 07/17/2020   Obesity 07/17/2020   Sprain of left wrist 07/10/2020    Spinal stenosis of lumbar region 01/01/2020   Constipation 08/11/2019   Benign essential hypertension 05/23/2019   Unstable angina ([place]) 01/14/2019   Closed fracture of distal end of radius 10/03/2018   Kidney stone 08/17/2017   Iron deficiency 08/12/2017   RLS (restless legs syndrome) 07/26/2017   Type 2 diabetes mellitus without complication, without long-term current use of insulin ([place]) 04/22/2017   Chest pain 09/30/2016   Acid reflux 10/11/2015   Adult hypothyroidism 10/11/2015   ADHD (attention deficit hyperactivity disorder) 10/11/2015   Radiculopathy, lumbar region 08/13/2015   Tear of meniscus of knee 07/11/2015   Left shoulder pain 06/18/2015   Synovitis of knee 05/13/2015   Low back pain 04/30/2015   Acute bronchitis due to infection 11/15/2014   Bariatric surgery status 10/22/2014   History of colonic polyps 08/16/2014   Routine history and physical examination of adult 06/14/2014   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   De Quervain's tenosynovitis, left 05/18/2014   Herpes genitalis 04/10/2014   Insomnia 04/10/2014   Lateral epicondylitis 02/26/2014   Acne 10/06/2013   HLD (hyperlipidemia) 08/18/2013   B12 deficiency 05/25/2013   Trochanteric bursitis, right hip 05/03/2013     Physical Exam  Triage Vital Signs: ED Triage Vitals  Enc Vitals Group     BP 12/09/22 0425 133/74  Pulse Rate 12/09/22 0425 (!) 102     Resp 12/09/22 0425 20     Temp 12/09/22 0425 98.4 F (36.9 C)     Temp Source 12/09/22 0425 Oral     SpO2 12/09/22 0425 98 %     Weight 12/09/22 0424 180 lb (81.6 kg)     Height 12/09/22 0424 '5\' 4"'$  (1.626 m)     Head Circumference --      Peak Flow --      Pain Score --      Pain Loc --      Pain Edu? --      Excl. in Advance? --     Most recent vital signs: Vitals:   12/09/22 0425  BP: 133/74  Pulse: (!) 102  Resp: 20  Temp: 98.4 F (36.9 C)  SpO2: 98%     General: Awake, no distress.  CV:  Good peripheral perfusion.  Resp:  Normal effort.   Abd:  No distention.  Neuro:             Awake, Alert, Oriented x 3  Other:  Bilateral thighs and calves are nontender, compartments are soft, no active muscle spasm 2+ DP pulses bilaterally Normal strength bilateral lower extremities   ED Results / Procedures / Treatments  Labs (all labs ordered are listed, but only abnormal results are displayed) Labs Reviewed  CBC WITH DIFFERENTIAL/PLATELET - Abnormal; Notable for the following components:      Result Value   WBC 11.6 (*)    Hemoglobin 11.4 (*)    HCT 35.4 (*)    All other components within normal limits  COMPREHENSIVE METABOLIC PANEL - Abnormal; Notable for the following components:   Glucose, Bld 124 (*)    Alkaline Phosphatase 157 (*)    All other components within normal limits     EKG     RADIOLOGY    PROCEDURES:  Critical Care performed: No  Procedures   MEDICATIONS ORDERED IN ED: Medications - No data to display   IMPRESSION / MDM / [place] / ED COURSE  I reviewed the triage vital signs and the nursing notes.                              Patient's presentation is most consistent with acute, uncomplicated illness.  Differential diagnosis includes, but is not limited to, electrolyte imbalance including hypokalemia, hypomagnesemia, rhabdomyolysis, viral syndrome, medication side effect, medication withdrawal  Patient is a 67 year old female with history of restless leg syndrome and chronic pain presents with bilateral atraumatic leg cramping and spasms that started around 2 AM.  She took her home methocarbamol and hydrocodone and since being in the ED her symptoms have largely resolved.  She looks comfortable is not in any distress.  Bilateral lower extremities have soft compartments no active spasm she is neurovascular intact.  Labs including a CBC and CMP were drawn from triage.  Electrolytes are largely within normal limits.  Given patient is largely asymptomatic do not feel that she needs  further workup or treatment at this time.  She is appropriate for discharge.       FINAL CLINICAL IMPRESSION(S) / ED DIAGNOSES   Final diagnoses:  Muscle spasms of both lower extremities     Rx / DC Orders   ED Discharge Orders     None        Note:  This document was prepared using Dragon  voice recognition software and may include unintentional dictation errors.   Rada Hay, MD 12/09/22 0730

## 2022-12-09 NOTE — ED Triage Notes (Addendum)
Pt presents via POV with complaints of muscle spasms in her legs. Hx of RLS takes gabapentin and mirapex. She notes the pain started around 2 AM with increased pain with ambulation. She notes taking her prescribed pain and muscle relaxer medication which has improved her pain slightly. Of note, pt was told by her PCP that should be dehydrated based on labs from December.  Denies falls, urinary sx, CP or SOB.

## 2022-12-10 ENCOUNTER — Encounter: Payer: Self-pay | Admitting: Internal Medicine

## 2022-12-11 ENCOUNTER — Inpatient Hospital Stay: Payer: Medicare PPO | Attending: Internal Medicine

## 2022-12-11 VITALS — BP 112/59 | HR 90 | Temp 96.3°F | Resp 18

## 2022-12-11 DIAGNOSIS — D509 Iron deficiency anemia, unspecified: Secondary | ICD-10-CM | POA: Diagnosis present

## 2022-12-11 DIAGNOSIS — K909 Intestinal malabsorption, unspecified: Secondary | ICD-10-CM

## 2022-12-11 MED ORDER — SODIUM CHLORIDE 0.9 % IV SOLN
Freq: Once | INTRAVENOUS | Status: AC
  Filled 2022-12-11: qty 250

## 2022-12-11 MED ORDER — SODIUM CHLORIDE 0.9 % IV SOLN
200.0000 mg | Freq: Once | INTRAVENOUS | Status: AC
  Administered 2022-12-11: 200 mg via INTRAVENOUS
  Filled 2022-12-11: qty 200

## 2022-12-18 ENCOUNTER — Inpatient Hospital Stay: Payer: Medicare PPO

## 2022-12-18 VITALS — BP 103/66 | HR 92 | Temp 96.9°F | Resp 18

## 2022-12-18 DIAGNOSIS — D509 Iron deficiency anemia, unspecified: Secondary | ICD-10-CM | POA: Diagnosis not present

## 2022-12-18 DIAGNOSIS — K909 Intestinal malabsorption, unspecified: Secondary | ICD-10-CM

## 2022-12-18 MED ORDER — SODIUM CHLORIDE 0.9 % IV SOLN
200.0000 mg | Freq: Once | INTRAVENOUS | Status: AC
  Administered 2022-12-18: 200 mg via INTRAVENOUS
  Filled 2022-12-18: qty 200

## 2022-12-18 MED ORDER — SODIUM CHLORIDE 0.9 % IV SOLN
Freq: Once | INTRAVENOUS | Status: AC
  Filled 2022-12-18: qty 250

## 2023-02-14 ENCOUNTER — Emergency Department: Payer: Medicare PPO

## 2023-02-14 ENCOUNTER — Emergency Department
Admission: EM | Admit: 2023-02-14 | Discharge: 2023-02-14 | Disposition: A | Discharge status: Home / Self Care | Payer: Medicare PPO | Attending: Emergency Medicine | Admitting: Emergency Medicine

## 2023-02-14 ENCOUNTER — Other Ambulatory Visit: Payer: Self-pay

## 2023-02-14 DIAGNOSIS — E872 Acidosis, unspecified: Secondary | ICD-10-CM

## 2023-02-14 DIAGNOSIS — Z20822 Contact with and (suspected) exposure to covid-19: Secondary | ICD-10-CM | POA: Diagnosis not present

## 2023-02-14 DIAGNOSIS — F1290 Cannabis use, unspecified, uncomplicated: Secondary | ICD-10-CM

## 2023-02-14 DIAGNOSIS — I10 Essential (primary) hypertension: Secondary | ICD-10-CM | POA: Diagnosis not present

## 2023-02-14 DIAGNOSIS — R7989 Other specified abnormal findings of blood chemistry: Secondary | ICD-10-CM | POA: Insufficient documentation

## 2023-02-14 DIAGNOSIS — N390 Urinary tract infection, site not specified: Secondary | ICD-10-CM

## 2023-02-14 DIAGNOSIS — R8271 Bacteriuria: Secondary | ICD-10-CM | POA: Insufficient documentation

## 2023-02-14 DIAGNOSIS — R42 Dizziness and giddiness: Secondary | ICD-10-CM | POA: Diagnosis present

## 2023-02-14 DIAGNOSIS — R0602 Shortness of breath: Secondary | ICD-10-CM | POA: Insufficient documentation

## 2023-02-14 DIAGNOSIS — Z79899 Other long term (current) drug therapy: Secondary | ICD-10-CM | POA: Diagnosis not present

## 2023-02-14 DIAGNOSIS — R5381 Other malaise: Secondary | ICD-10-CM | POA: Diagnosis not present

## 2023-02-14 DIAGNOSIS — R4182 Altered mental status, unspecified: Secondary | ICD-10-CM | POA: Diagnosis not present

## 2023-02-14 DIAGNOSIS — R059 Cough, unspecified: Secondary | ICD-10-CM | POA: Insufficient documentation

## 2023-02-14 DIAGNOSIS — Z86711 Personal history of pulmonary embolism: Secondary | ICD-10-CM | POA: Insufficient documentation

## 2023-02-14 DIAGNOSIS — I959 Hypotension, unspecified: Secondary | ICD-10-CM | POA: Insufficient documentation

## 2023-02-14 DIAGNOSIS — E119 Type 2 diabetes mellitus without complications: Secondary | ICD-10-CM | POA: Diagnosis not present

## 2023-02-14 DIAGNOSIS — R748 Abnormal levels of other serum enzymes: Secondary | ICD-10-CM | POA: Diagnosis not present

## 2023-02-14 DIAGNOSIS — R7402 Elevation of levels of lactic acid dehydrogenase (LDH): Secondary | ICD-10-CM

## 2023-02-14 LAB — COMPREHENSIVE METABOLIC PANEL
ALT: 24 U/L (ref 0–44)
AST: 28 U/L (ref 15–41)
Albumin: 3.9 g/dL (ref 3.5–5.0)
Alkaline Phosphatase: 135 U/L — ABNORMAL HIGH (ref 38–126)
Anion gap: 8 (ref 5–15)
BUN: 18 mg/dL (ref 8–23)
CO2: 26 mmol/L (ref 22–32)
Calcium: 8.8 mg/dL — ABNORMAL LOW (ref 8.9–10.3)
Chloride: 100 mmol/L (ref 98–111)
Creatinine, Ser: 0.93 mg/dL (ref 0.44–1.00)
GFR, Estimated: >60 mL/min (ref >60)
Glucose, Bld: 185 mg/dL — ABNORMAL HIGH (ref 70–99)
Potassium: 4.2 mmol/L (ref 3.5–5.1)
Sodium: 134 mmol/L — ABNORMAL LOW (ref 135–145)
Total Bilirubin: 0.6 mg/dL (ref 0.3–1.2)
Total Protein: 7 g/dL (ref 6.5–8.1)

## 2023-02-14 LAB — CBG MONITORING, ED
Glucose-Capillary: 147 mg/dL — ABNORMAL HIGH (ref 70–99)
Glucose-Capillary: 117 mg/dL — ABNORMAL HIGH (ref 70–99)

## 2023-02-14 LAB — PROTIME-INR
INR: 0.9 (ref 0.8–1.2)
Prothrombin Time: 12.4 seconds (ref 11.4–15.2)

## 2023-02-14 LAB — CBC WITH DIFFERENTIAL/PLATELET
Abs Immature Granulocytes: 0.09 10*3/uL — ABNORMAL HIGH (ref 0.00–0.07)
Basophils Absolute: 0 10*3/uL (ref 0.0–0.1)
Basophils Relative: 0 %
Eosinophils Absolute: 0.2 10*3/uL (ref 0.0–0.5)
Eosinophils Relative: 2 %
HCT: 34 % — ABNORMAL LOW (ref 36.0–46.0)
Hemoglobin: 11.2 g/dL — ABNORMAL LOW (ref 12.0–15.0)
Immature Granulocytes: 1 %
Lymphocytes Relative: 17 %
Lymphs Abs: 1.2 10*3/uL (ref 0.7–4.0)
MCH: 30.8 pg (ref 26.0–34.0)
MCHC: 32.9 g/dL (ref 30.0–36.0)
MCV: 93.4 fL (ref 80.0–100.0)
Monocytes Absolute: 0.6 10*3/uL (ref 0.1–1.0)
Monocytes Relative: 9 %
Neutro Abs: 5.3 10*3/uL (ref 1.7–7.7)
Neutrophils Relative %: 71 %
Platelets: 223 10*3/uL (ref 150–400)
RBC: 3.64 MIL/uL — ABNORMAL LOW (ref 3.87–5.11)
RDW: 13.6 % (ref 11.5–15.5)
WBC: 7.4 10*3/uL (ref 4.0–10.5)
nRBC: 0 % (ref 0.0–0.2)

## 2023-02-14 LAB — URINALYSIS, COMPLETE (UACMP) WITH MICROSCOPIC
Bilirubin Urine: NEGATIVE
Glucose, UA: NEGATIVE mg/dL
Hgb urine dipstick: NEGATIVE
Ketones, ur: NEGATIVE mg/dL
Nitrite: NEGATIVE
Protein, ur: NEGATIVE mg/dL
Specific Gravity, Urine: 1.006 (ref 1.005–1.030)
pH: 5 (ref 5.0–8.0)

## 2023-02-14 LAB — D-DIMER, QUANTITATIVE: D-Dimer, Quant: 1.73 ug/mL-FEU — ABNORMAL HIGH (ref 0.00–0.50)

## 2023-02-14 LAB — RESP PANEL BY RT-PCR (RSV, FLU A&B, COVID)  RVPGX2
Influenza A by PCR: NEGATIVE
Influenza B by PCR: NEGATIVE
Resp Syncytial Virus by PCR: NEGATIVE
SARS Coronavirus 2 by RT PCR: NEGATIVE

## 2023-02-14 LAB — LACTIC ACID, PLASMA
Lactic Acid, Venous: 3.1 mmol/L (ref 0.5–1.9)
Lactic Acid, Venous: 3.2 mmol/L (ref 0.5–1.9)

## 2023-02-14 LAB — TROPONIN I (HIGH SENSITIVITY): Troponin I (High Sensitivity): 4 ng/L (ref <18)

## 2023-02-14 LAB — LIPASE, BLOOD: Lipase: 29 U/L (ref 11–51)

## 2023-02-14 LAB — APTT: aPTT: 28 seconds (ref 24–36)

## 2023-02-14 LAB — TSH: TSH: 0.336 u[IU]/mL — ABNORMAL LOW (ref 0.350–4.500)

## 2023-02-14 MED ORDER — SODIUM CHLORIDE 0.9 % IV BOLUS
1000.0000 mL | Freq: Once | INTRAVENOUS | Status: AC
  Administered 2023-02-14: 1000 mL via INTRAVENOUS

## 2023-02-14 MED ORDER — SODIUM CHLORIDE 0.9 % IV SOLN
1.0000 g | INTRAVENOUS | Status: AC
  Administered 2023-02-14: 1 g via INTRAVENOUS
  Filled 2023-02-14: qty 10

## 2023-02-14 MED ORDER — IOHEXOL 350 MG/ML SOLN
75.0000 mL | Freq: Once | INTRAVENOUS | Status: AC | PRN
  Administered 2023-02-14: 75 mL via INTRAVENOUS

## 2023-02-14 MED ORDER — CEPHALEXIN 500 MG PO CAPS: 500.0000 mg | ORAL_CAPSULE | Freq: Two times a day (BID) | ORAL | 0 refills | Status: DC

## 2023-02-14 MED ORDER — LACTATED RINGERS IV BOLUS (SEPSIS)
1000.0000 mL | Freq: Once | INTRAVENOUS | Status: AC
  Administered 2023-02-14: 1000 mL via INTRAVENOUS

## 2023-02-14 NOTE — Assessment & Plan Note (Addendum)
Patient presenting with generalized malaise in the setting of recent CBD gummy use Clinical findings so far do show a mild UTI as well as mild lactic acidosis with a lactate in the low threes Getting maintenance IV fluid hydration with resolution of lower limit of blood pressure Pressure is now 120s over 70s Started on IV Rocephin Had a relatively lengthy discussion with patient at bedside who is fairly adamant about going home Patient feels that symptoms are likely related to the CBD gummy use as she has had similar symptoms remotely the past I tend to agree with this Had a relatively lengthy discussion with Dr. Jacqualine Code about symptoms to which she is also in agreement Patient is agreeable to continue hydration ER with treatment for UTI and prompt follow-up in the morning with her PCP Patient is to return to the ER if symptoms worsen or persist  Advised patient to avoid any further CBD use

## 2023-02-14 NOTE — Consult Note (Signed)
Initial Consultation Note   Patient: Courtney Murphy [id number] DOB: July 29, 1956 PCP: Audelia Acton, MD DOA: 02/14/2023 DOS: the patient was seen and examined on 02/14/2023 Primary service: Delman Kitten, MD  Referring physician: Jacqualine Code MD  Reason for consult: malaise, hypotension    Assessment/Plan: Assessment and Plan: Malaise Patient presenting with generalized malaise in the setting of recent CBD gummy use Clinical findings so far do show a mild UTI as well as mild lactic acidosis with a lactate in the low threes Getting maintenance IV fluid hydration with resolution of lower limit of blood pressure Pressure is now 120s over 70s Started on IV Rocephin Had a relatively lengthy discussion with patient at bedside who is fairly adamant about going home Patient feels that symptoms are likely related to the CBD gummy use as she has had similar symptoms remotely the past I tend to agree with this Had a relatively lengthy discussion with Dr. Jacqualine Code about symptoms to which she is also in agreement Patient is agreeable to continue hydration ER with treatment for UTI and prompt follow-up in the morning with her PCP Patient is to return to the ER if symptoms worsen or persist  Advised patient to avoid any further CBD use       TRH will sign off at present, please call us again when needed.  HPI: Courtney Murphy is a 67 y.o. female with past medical history of multiple medical issues including type 2 diabetes, asthma, hypertension, hypothyroidism, restless leg presenting with malaise and hypotension.  Patient reports waking up this morning with generalized malaise and weakness.  Patient felt "out of her body ".  Minimal cough or shortness of breath.  No fevers or chills.  Patient felt overall weak.  No hemiparesis or confusion.  No slurred speech.  Patient initially felt like it might be her blood sugar.  However blood sugar was normal.  Denies any recent medication changes.  Blood sugars  been fairly controlled in the 150s.  Does take lisinopril as well as Lasix daily.  Denies any extra dosing.  Appetite has been stable.  Symptoms persisted over the course of the morning to which patient came to the ER for further evaluation. Patient presents to the ER afebrile, initial blood pressures in the 90s.  Satting well on room air.  Given 2 L bolus with pressures improving to the 110s over 70s.  Initial lactate in the threes.  White count 7.4, hemoglobin 11.2.  COVID flu and RSV negative.  D-dimer 1.73.  Chest x-ray and CT angio grossly within normal limits.  Urinalysis indicative of infection.  Patient subsequently started on IV Rocephin treatment. After discussion with the patient for some time, patient admits to taking a CBD gummy last night.  Patient reports history of taking a CBD gummy approximately 2 weeks prior with similar symptoms though less severe.  Patient feels that her symptoms are likely related to the CBD gummy.  Patient denies any illicit drug use.  Denies any alcohol use.  Does smoke about a quart a pack per day of cigarettes.. Review of Systems: As mentioned in the history of present illness. All other systems reviewed and are negative. Past Medical History:  Diagnosis Date   Acid reflux 10/11/2015   Anemia    RECEIVES IRON INFUSIONS   Arthritis    Asthma    WELL CONTROLLED   Depression    Diabetes mellitus without complication ([place])    Fatty liver    Gout    History of  kidney stones    History of methicillin resistant staphylococcus aureus (MRSA) 2016   Hypertension    H/O BEEN OFF BP MEDS FOR 1 YEAR   Hypothyroidism    Kidney stone    Knee pain    Left   Pulmonary emboli ([place]) 2011   Restless leg syndrome    Shoulder pain    Past Surgical History:  Procedure Laterality Date   ABDOMINAL HYSTERECTOMY     BACK SURGERY     BREAST REDUCTION SURGERY     CATARACT EXTRACTION W/PHACO Left 07/28/2017   Procedure: CATARACT EXTRACTION PHACO AND INTRAOCULAR LENS  PLACEMENT ([place]) diabetic Left;  Surgeon: Leandrew Koyanagi, MD;  Location: [facility];  Service: Ophthalmology;  Laterality: Left;  Diabetic   CESAREAN SECTION  1990   CHOLECYSTECTOMY     CYSTOSCOPY/URETEROSCOPY/HOLMIUM LASER/STENT PLACEMENT Left 09/08/2017   Procedure: CYSTOSCOPY/URETEROSCOPY/HOLMIUM LASER/STENT EXCHANGE;  Surgeon: Hollice Espy, MD;  Location: [facility];  Service: Urology;  Laterality: Left;   CYSTOSCOPY/URETEROSCOPY/HOLMIUM LASER/STENT PLACEMENT Left 12/14/2018   Procedure: CYSTOSCOPY/URETEROSCOPY/HOLMIUM LASER/STENT PLACEMENT;  Surgeon: Hollice Espy, MD;  Location: [facility];  Service: Urology;  Laterality: Left;   FOOT SURGERY     GASTRIC BYPASS     IR NEPHROSTOMY PLACEMENT LEFT  08/17/2017   KNEE ARTHROSCOPY Left    KNEE ARTHROSCOPY Left 07/19/2015   Procedure: ARTHROSCOPY KNEE;  Surgeon: Leanor Kail, MD;  Location: [facility];  Service: Orthopedics;  Laterality: Left;   NEPHROLITHOTOMY Left 08/17/2017   Procedure: NEPHROLITHOTOMY PERCUTANEOUS WITH HOLMIUM LASER;  Surgeon: Hollice Espy, MD;  Location: [facility];  Service: Urology;  Laterality: Left;   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/2021   THUMB ARTHROSCOPY     TONSILLECTOMY     Social History:  reports that she quit smoking about 7 years ago. Her smoking use included cigarettes. She has a 2.00 pack-year smoking history. She has never used smokeless tobacco. She reports that she does not currently use alcohol. She reports that she does not use drugs.  Allergies  Allergen Reactions   Sulfasalazine Hives   Nsaids     Avoids because of gastric bypass   Sulfa Antibiotics Hives   Penicillins Hives and Rash    Has patient had a PCN reaction causing immediate rash, facial/tongue/throat swelling, SOB or lightheadedness with hypotension: No Has patient had a PCN reaction causing severe rash involving mucus membranes or skin necrosis: No Has patient had a PCN reaction that required hospitalization:  No Has patient had a PCN reaction occurring within the last 10 years: No If all of the above answers are "NO", then may proceed with Cephalosporin use.     Family History  Problem Relation Age of Onset   Cancer Mother        breast   Heart disease Father    Diabetes Father    Hypertension Father    Cancer Maternal Grandmother    Cancer Maternal Grandfather    Heart disease Paternal Grandmother    Heart disease Paternal Grandfather    Cancer Maternal Aunt    Cancer Maternal Uncle    Heart disease Paternal Aunt    Heart disease Paternal Uncle    Kidney cancer Neg Hx    Bladder Cancer Neg Hx     Prior to Admission medications   Medication Sig Start Date End Date Taking? Authorizing Provider  atomoxetine (STRATTERA) 40 MG capsule  03/13/20  Yes [provider]  atorvastatin (LIPITOR) 20 MG tablet Take 20 mg by mouth every morning.  06/27/18  Yes [provider]  cephALEXin (KEFLEX) 500 MG capsule Take 1 capsule (500 mg total) by mouth 2 (two) times daily. 02/14/23  Yes Delman Kitten, MD  Cholecalciferol 125 MCG (5000 UT) capsule Take 5,000 Units by mouth daily. 12/08/22 12/08/23 Yes [provider]  colchicine 0.6 MG tablet Take 1.2mg  (2 tablets) then 0.6mg  (1 tablet) 1 hour after. Then, take 1 tablet every day for 7 days. Patient taking differently: Take 0.6 mg by mouth See admin instructions. Take 1.2mg  (2 tablets) then 0.6mg  (1 tablet) 1 hour after. Then, take 1 tablet every day for 7 days.  Pt takes this medication prn 05/14/22  Yes McDonald, Adam R, DPM  cyanocobalamin (,VITAMIN B-12,) 1000 MCG/ML injection Inject 1,000 mcg into the muscle once a week. 06/30/20  Yes [provider]  cyclobenzaprine (FLEXERIL) 10 MG tablet Take 10 mg by mouth every 8 (eight) hours. 06/14/20  Yes [provider]  DULoxetine HCl 40 MG CPEP Take 1 capsule by mouth daily. 05/26/21  Yes [provider]  fluticasone (FLONASE) 50 MCG/ACT nasal spray Place 2  sprays into both nostrils daily as needed for rhinitis or allergies. 08/25/22  Yes [provider]  furosemide (LASIX) 20 MG tablet Take 20 mg by mouth daily. 02/03/23  Yes [provider]  HYDROcodone-acetaminophen (NORCO/VICODIN) 5-325 MG tablet Take 1 tablet by mouth 3 (three) times daily as needed. 07/07/20  Yes [provider]  levothyroxine (SYNTHROID, LEVOTHROID) 100 MCG tablet Take 100 mcg by mouth daily before breakfast.   Yes [provider]  lisinopril (ZESTRIL) 5 MG tablet Take 5 mg by mouth daily. 05/11/22  Yes [provider]  metFORMIN (GLUCOPHAGE-XR) 750 MG 24 hr tablet Take by mouth. 03/10/21 02/14/23 Yes [provider]  OZEMPIC, 1 MG/DOSE, 4 MG/3ML SOPN Inject 1 mg into the skin once a week. 02/05/23 02/05/24 Yes [provider]  pramipexole (MIRAPEX) 0.5 MG tablet Take 0.5-1 mg by mouth at bedtime.    Yes [provider]  pregabalin (LYRICA) 50 MG capsule Take 50 mg by mouth 2 (two) times daily. 01/20/23  Yes [provider]    Physical Exam: Vitals:   02/14/23 0724 02/14/23 0730 02/14/23 0745 02/14/23 1128  BP:  103/68 103/60 106/74  Pulse:  92 82 80  Resp:  10 13 17   Temp:    98.2 F (36.8 C)  TempSrc:    Oral  SpO2:  98% 99% 98%  Weight: 77.1 kg     Height: 5\' 4"  (1.626 m)      Physical Exam Constitutional:      Appearance: She is normal weight.  HENT:     Head: Normocephalic and atraumatic.     Mouth/Throat:     Mouth: Mucous membranes are moist.  Eyes:     Pupils: Pupils are equal, round, and reactive to light.  Cardiovascular:     Rate and Rhythm: Normal rate and regular rhythm.  Pulmonary:     Effort: Pulmonary effort is normal.  Abdominal:     General: Bowel sounds are normal.  Musculoskeletal:        General: Normal range of motion.  Skin:    General: Skin is dry.  Neurological:     General: No focal deficit present.  Psychiatric:        Mood and Affect: Mood normal.      Data Reviewed:   There are no new results to review at this time.  CT Angio Chest PE W and/or Wo  Contrast CLINICAL DATA:  Pulmonary embolism (PE) suspected, high prob, cough, positive d-dimer, hypotension  EXAM: CT ANGIOGRAPHY CHEST WITH CONTRAST  TECHNIQUE: Multidetector CT imaging of the chest was performed using the standard protocol during bolus administration of intravenous contrast. Multiplanar CT image reconstructions and MIPs were obtained to evaluate the vascular anatomy.  RADIATION DOSE REDUCTION: This exam was performed according to the departmental dose-optimization program which includes automated exposure control, adjustment of the mA and/or kV according to patient size and/or use of iterative reconstruction technique.  CONTRAST:  69mL OMNIPAQUE IOHEXOL 350 MG/ML SOLN  COMPARISON:  None Available.  FINDINGS: Cardiovascular: No filling defects within the pulmonary arteries to suggest acute pulmonary embolism.  Mediastinum/Nodes: No axillary or supraclavicular adenopathy. No mediastinal or hilar adenopathy. No pericardial fluid. Esophagus normal.  Lungs/Pleura: No pulmonary infarction. No pneumonia. No pleural fluid. No pneumothorax  Upper Abdomen: Post bariatric surgery.  No acute findings  Musculoskeletal: Degenerative osteophytosis of the spine.  Review of the MIP images confirms the above findings.  IMPRESSION: 1. No evidence acute pulmonary embolism. 2. No acute pulmonary parenchymal findings.  Electronically Signed   By: Suzy Bouchard M.D.   On: 02/14/2023 09:55 DG Chest 2 View CLINICAL DATA:  Sepsis.  Cough and congestion for 3 days.  EXAM: CHEST - 2 VIEW  COMPARISON:  05/02/2022  FINDINGS: Normal cardiomediastinal contours. No pleural fluid or airspace disease. No interstitial edema. Visualized osseous structures are unremarkable.  IMPRESSION: No active cardiopulmonary abnormalities.  Electronically Signed   By: Kerby Moors M.D.   On: 02/14/2023 08:10  Lab Results  Component Value Date   WBC 7.4 02/14/2023   HGB 11.2 (L) 02/14/2023   HCT 34.0 (L) 02/14/2023   MCV 93.4 02/14/2023   PLT 223 [id number]   Last metabolic panel Lab Results  Component Value Date   GLUCOSE 185 (H) 02/14/2023   NA 134 (L) 02/14/2023   K 4.2 02/14/2023   CL 100 02/14/2023   CO2 26 02/14/2023   BUN 18 02/14/2023   CREATININE 0.93 02/14/2023   GFRNONAA >60 02/14/2023   CALCIUM 8.8 (L) 02/14/2023   PROT 7.0 02/14/2023   ALBUMIN 3.9 02/14/2023   LABGLOB 2.2 07/17/2020   AGRATIO 2.0 07/17/2020   BILITOT 0.6 02/14/2023   ALKPHOS 135 (H) 02/14/2023   AST 28 02/14/2023   ALT 24 02/14/2023   ANIONGAP 8 02/14/2023      Family Communication:    Primary team communication: Discussed case w/ Dr. Jacqualine Code.  Thank you very much for involving [place] in the care of your patient.  Author: Deneise Lever, MD 02/14/2023 12:33 PM  For on call review [email or web address].

## 2023-02-14 NOTE — ED Triage Notes (Signed)
Pt bib ems c/o low blood sugar = 111 at 3:30AM, patient states "that's low for me". Pt ate candy and peanut butter and sugar went up to 170. Pt states"I feel like I dont want to open my eyes". Pt opened eyes when asked. Takes Ozempic once a week, and Metformin.  109/69 HR 94 CBG 111 --> 170

## 2023-02-14 NOTE — ED Provider Notes (Signed)
[facility] Provider Note    Event Date/Time   First MD Initiated Contact with Patient 02/14/23 [id number]     (approximate)   History   Hypoglycemia   HPI  Courtney Murphy is a 67 y.o. female has a history restless leg syndrome, diabetes, depression, hypertension on lisinopril which she last took yesterday evening, remote history of PE  Patient in her normal health feeling well except on Friday she started develop what she describes as a "congestion" with slight cough but feeling of congestion in her chest very mild.  She reports that has not really been concerning to her, and did not really think much of it.  No shortness of breath no chest pain.  No headaches.  She woke up early this morning which is not unusual, about 3:30 AM she got up and was in her home and noticed that she had a feeling of lightheadedness.  No difficulty speaking not weak in any arm or leg not off balance, but she reports she just felt very lightheaded everything just feels very "distant"  No abdominal pain.  No fevers or chills.  No pain or burning with urination.  No chest pain no skipping beats or palpitations.  No alcohol use last night  Patient reports that she just feels very low on energy and sort of weak all over    Patient is noted to be hypotensive.  I asked her about her blood pressure and she reports she typically takes lisinopril and has a history of high blood pressure.  She last took her lisinopril yesterday evening at her typical dose of 5 mg which she has been on for some time  She does report she started use of Ozempic about 1 week ago for weight loss, but has not experienced any untoward side effects no abdominal pain no diarrhea no loose stools no nausea no vomiting.  Physical Exam   Triage Vital Signs: ED Triage Vitals  Enc Vitals Group     BP 02/14/23 0714 92/67     Pulse Rate 02/14/23 0714 87     Resp 02/14/23 0714 18     Temp 02/14/23 0714 98.4 F (36.9 C)      Temp Source 02/14/23 0714 Oral     SpO2 02/14/23 0714 99 %     Weight 02/14/23 0724 170 lb (77.1 kg)     Height 02/14/23 0724 5\' 4"  (1.626 m)     Head Circumference --      Peak Flow --      Pain Score 02/14/23 0724 0     Pain Loc --      Pain Edu? --      Excl. in [place]? --     Most recent vital signs: Vitals:   02/14/23 0730 02/14/23 0745  BP: 103/68 103/60  Pulse: 92 82  Resp: 10 13  Temp:    SpO2: 98% 99%     General: Awake, no distress.  Pleasant.  Appears fairly low in energy.  In no distress.  Moves all extremities well without difficulty.  Facial expressions are clear.  Well oriented. CV:  Good peripheral perfusion.  Normal tones and rate. Resp:  Normal effort.  Clear bilateral.  No noted.  She does not appear to have or difficulty breathing Abd:  No distention.  Soft nontender nondistended through all quadrants. Other:  Fully alert well-oriented.  Cranial nerves grossly intact.   ED Results / Procedures / Treatments   Labs (all labs  ordered are listed, but only abnormal results are displayed) Labs Reviewed  TSH - Abnormal; Notable for the following components:      Result Value   TSH 0.336 (*)    All other components within normal limits  LACTIC ACID, PLASMA - Abnormal; Notable for the following components:   Lactic Acid, Venous 3.1 (*)    All other components within normal limits  LACTIC ACID, PLASMA - Abnormal; Notable for the following components:   Lactic Acid, Venous 3.2 (*)    All other components within normal limits  COMPREHENSIVE METABOLIC PANEL - Abnormal; Notable for the following components:   Sodium 134 (*)    Glucose, Bld 185 (*)    Calcium 8.8 (*)    Alkaline Phosphatase 135 (*)    All other components within normal limits  URINALYSIS, COMPLETE (UACMP) WITH MICROSCOPIC - Abnormal; Notable for the following components:   Color, Urine YELLOW (*)    APPearance CLEAR (*)    Leukocytes,Ua TRACE (*)    Bacteria, UA RARE (*)    All other  components within normal limits  CBC WITH DIFFERENTIAL/PLATELET - Abnormal; Notable for the following components:   RBC 3.64 (*)    Hemoglobin 11.2 (*)    HCT 34.0 (*)    Abs Immature Granulocytes 0.09 (*)    All other components within normal limits  D-DIMER, QUANTITATIVE - Abnormal; Notable for the following components:   D-Dimer, Quant 1.73 (*)    All other components within normal limits  CBG MONITORING, ED - Abnormal; Notable for the following components:   Glucose-Capillary 147 (*)    All other components within normal limits  RESP PANEL BY RT-PCR (RSV, FLU A&B, COVID)  [id number]  CULTURE, BLOOD (ROUTINE X 2)  CULTURE, BLOOD (ROUTINE X 2)  URINE CULTURE  LIPASE, BLOOD  PROTIME-INR  APTT  CBC WITH DIFFERENTIAL/PLATELET  CBG MONITORING, ED  CBG MONITORING, ED  CBG MONITORING, ED  CBG MONITORING, ED  TROPONIN I (HIGH SENSITIVITY)     EKG  Interpreted by me at 730 heart rate 90 QRS 90 QTc 440 Normal sinus rhythm, occasional irregularity. No evidence of acute ischemia.   RADIOLOGY  Chest x-ray interpreted by me as negative for acute finding   CT Angio Chest PE W and/or Wo Contrast  Result Date: 02/14/2023 CLINICAL DATA:  Pulmonary embolism (PE) suspected, high prob, cough, positive d-dimer, hypotension EXAM: CT ANGIOGRAPHY CHEST WITH CONTRAST TECHNIQUE: Multidetector CT imaging of the chest was performed using the standard protocol during bolus administration of intravenous contrast. Multiplanar CT image reconstructions and MIPs were obtained to evaluate the vascular anatomy. RADIATION DOSE REDUCTION: This exam was performed according to the departmental dose-optimization program which includes automated exposure control, adjustment of the mA and/or kV according to patient size and/or use of iterative reconstruction technique. CONTRAST:  29mL OMNIPAQUE IOHEXOL 350 MG/ML SOLN COMPARISON:  None Available. FINDINGS: Cardiovascular: No filling defects within the pulmonary  arteries to suggest acute pulmonary embolism. Mediastinum/Nodes: No axillary or supraclavicular adenopathy. No mediastinal or hilar adenopathy. No pericardial fluid. Esophagus normal. Lungs/Pleura: No pulmonary infarction. No pneumonia. No pleural fluid. No pneumothorax Upper Abdomen: Post bariatric surgery.  No acute findings Musculoskeletal: Degenerative osteophytosis of the spine. Review of the MIP images confirms the above findings. IMPRESSION: 1. No evidence acute pulmonary embolism. 2. No acute pulmonary parenchymal findings. Electronically Signed   By: Suzy Bouchard M.D.   On: 02/14/2023 09:55   DG Chest 2 View  Result Date: 02/14/2023 CLINICAL DATA:  Sepsis.  Cough and congestion for 3 days. EXAM: CHEST - 2 VIEW COMPARISON:  05/02/2022 FINDINGS: Normal cardiomediastinal contours. No pleural fluid or airspace disease. No interstitial edema. Visualized osseous structures are unremarkable. IMPRESSION: No active cardiopulmonary abnormalities. Electronically Signed   By: Kerby Moors M.D.   On: 02/14/2023 08:10    CT chest negative for PE   PROCEDURES:  Critical Care performed: No  Procedures   MEDICATIONS ORDERED IN ED: Medications  cefTRIAXone (ROCEPHIN) 1 g in sodium chloride 0.9 % 100 mL IVPB (has no administration in time range)  sodium chloride 0.9 % bolus 1,000 mL (has no administration in time range)  lactated ringers bolus 1,000 mL (0 mLs Intravenous Stopped 02/14/23 0840)  iohexol (OMNIPAQUE) 350 MG/ML injection 75 mL (75 mLs Intravenous Contrast Given 02/14/23 0923)     IMPRESSION / MDM / ASSESSMENT AND PLAN / ED COURSE  I reviewed the triage vital signs and the nursing notes.                              Differential diagnosis includes, but is not limited to, possible causes such as electrolyte abnormality, dehydration, medication effect, rule out infection in particular pulmonary infection given the patient's associated cough, etc.  She shows no evidence of  respiratory distress though.  Lung sounds are clear.  Speaks in full clear sentences.  No oxygen requirement.  Cardiac evaluation demonstrates no acute findings on ECG and she denies any chest pain or dyspnea. Dimer positive, PE study negative  Patient is hypotensive, but in no acute distress.  Suspect generalized weakness associated with hypotension, unclear of the exact underlying factor at this time but workup has been ordered and pending.  No headaches no central neurologic features.  No signs or symptoms would be suggestive of stroke, neurologic central cause.  Plan to hydrate with 1 L of lactated ringers.  Patient's presentation is most consistent with acute complicated illness / injury requiring diagnostic workup.   The patient is on the cardiac monitor to evaluate for evidence of arrhythmia and/or significant heart rate changes.   Urinalysis positive for leukocytes, also rare bacteria and white blood cells on a clean sample.  Patient does not have any obvious infectious symptoms or urinary infection symptoms though.  Given her presentation with hypotension and fatigue and a possible source will initiate Rocephin.  Her lactic acid is rising despite fluid resuscitation, though her blood pressure is also improved and symptomatically she reports she is feeling quite a bit better.  She is alert well-oriented at this point recommend the patient stay for ongoing treatment evaluation of the hospitalist and observation patient in the setting of rising lactic acid    Consulted with the hospitalist, accepted by Dr. Ernestina Patches.  Patient is understanding agreeable with plan for admission  FINAL CLINICAL IMPRESSION(S) / ED DIAGNOSES   Final diagnoses:  Bacteria in urine  Hypotension, unspecified hypotension type  Elevated serum lactate dehydrogenase (LDH)     Rx / DC Orders   ED Discharge Orders     None        Note:  This document was prepared using Dragon voice recognition software and  may include unintentional dictation errors.   Delman Kitten, MD 02/14/23 1124

## 2023-02-14 NOTE — ED Notes (Signed)
Pt ambulated to the room toilet and back to bed by self, steady gait. AO x 4, states she's is feeling ,much better, back to her normal self.

## 2023-02-14 NOTE — ED Provider Notes (Signed)
Patient seen by hospitalist, at that point patient did inform Dr. Ernestina Patches that she took a CBD or some type of similar gummy last night and that she had about the same feeling and reaction when she did it 2 weeks ago.  Patient to discontinue this She is feeling much improved her blood pressure improving she is resting comfortably at this time requesting discharge.  Given that we now have a good explanation for why she may have come in with altered mental status or strange feeling of generalized weakness that is now completely resolved I think she is safe for discharge  Discussed careful return precautions with patient, provided prescription for cephalexin, and patient is going to hold her lisinopril and furosemide for the next 2 days.  Follow-up with primary care  Return precautions and treatment recommendations and follow-up discussed with the patient who is agreeable with the plan.    Delman Kitten, MD 02/14/23 1229

## 2023-02-15 LAB — URINE CULTURE: Culture: NO GROWTH

## 2023-02-19 LAB — CULTURE, BLOOD (ROUTINE X 2)
Culture: NO GROWTH
Culture: NO GROWTH
Special Requests: ADEQUATE
Special Requests: ADEQUATE

## 2023-03-15 ENCOUNTER — Encounter: Payer: Self-pay | Admitting: Internal Medicine

## 2023-05-07 ENCOUNTER — Inpatient Hospital Stay: Payer: Medicare PPO | Admitting: Internal Medicine

## 2023-05-07 ENCOUNTER — Ambulatory Visit: Payer: PRIVATE HEALTH INSURANCE

## 2023-05-07 ENCOUNTER — Inpatient Hospital Stay: Payer: Medicare PPO

## 2023-05-07 ENCOUNTER — Ambulatory Visit: Payer: PRIVATE HEALTH INSURANCE | Admitting: Internal Medicine

## 2023-05-19 ENCOUNTER — Other Ambulatory Visit: Payer: Self-pay | Admitting: *Deleted

## 2023-05-19 DIAGNOSIS — E611 Iron deficiency: Secondary | ICD-10-CM

## 2023-05-21 MED FILL — Iron Sucrose Inj 20 MG/ML (Fe Equiv): INTRAVENOUS | Qty: 10 | Status: AC

## 2023-05-24 ENCOUNTER — Inpatient Hospital Stay: Payer: Medicare PPO

## 2023-05-24 ENCOUNTER — Inpatient Hospital Stay: Payer: Medicare PPO | Admitting: Internal Medicine

## 2023-06-14 ENCOUNTER — Ambulatory Visit: Payer: Medicare PPO

## 2023-06-14 ENCOUNTER — Ambulatory Visit: Payer: Medicare PPO | Admitting: Internal Medicine

## 2023-06-14 ENCOUNTER — Other Ambulatory Visit: Payer: Medicare PPO

## 2023-07-05 ENCOUNTER — Inpatient Hospital Stay: Payer: Medicare PPO | Admitting: Internal Medicine

## 2023-07-05 ENCOUNTER — Inpatient Hospital Stay: Payer: Medicare PPO | Attending: Internal Medicine

## 2023-07-05 ENCOUNTER — Encounter: Payer: Self-pay | Admitting: Internal Medicine

## 2023-07-05 ENCOUNTER — Inpatient Hospital Stay: Payer: Medicare PPO

## 2023-07-05 VITALS — BP 91/46 | HR 86 | Resp 18

## 2023-07-05 VITALS — BP 105/67 | HR 94 | Temp 96.6°F | Resp 18 | Wt 172.4 lb

## 2023-07-05 DIAGNOSIS — E538 Deficiency of other specified B group vitamins: Secondary | ICD-10-CM

## 2023-07-05 DIAGNOSIS — Z86711 Personal history of pulmonary embolism: Secondary | ICD-10-CM | POA: Diagnosis not present

## 2023-07-05 DIAGNOSIS — R760 Raised antibody titer: Secondary | ICD-10-CM | POA: Diagnosis not present

## 2023-07-05 DIAGNOSIS — Z9884 Bariatric surgery status: Secondary | ICD-10-CM

## 2023-07-05 DIAGNOSIS — K909 Intestinal malabsorption, unspecified: Secondary | ICD-10-CM

## 2023-07-05 DIAGNOSIS — E611 Iron deficiency: Secondary | ICD-10-CM

## 2023-07-05 DIAGNOSIS — D509 Iron deficiency anemia, unspecified: Secondary | ICD-10-CM | POA: Diagnosis present

## 2023-07-05 LAB — BASIC METABOLIC PANEL
Anion gap: 10 (ref 5–15)
BUN: 24 mg/dL — ABNORMAL HIGH (ref 8–23)
CO2: 26 mmol/L (ref 22–32)
Calcium: 8.8 mg/dL — ABNORMAL LOW (ref 8.9–10.3)
Chloride: 99 mmol/L (ref 98–111)
Creatinine, Ser: 0.9 mg/dL (ref 0.44–1.00)
GFR, Estimated: >60 mL/min (ref >60)
Glucose, Bld: 177 mg/dL — ABNORMAL HIGH (ref 70–99)
Potassium: 4.2 mmol/L (ref 3.5–5.1)
Sodium: 135 mmol/L (ref 135–145)

## 2023-07-05 LAB — IRON AND TIBC
Iron: 50 ug/dL (ref 28–170)
Saturation Ratios: 12 % (ref 10.4–31.8)
TIBC: 403 ug/dL (ref 250–450)
UIBC: 353 ug/dL

## 2023-07-05 LAB — CBC
HCT: 33.6 % — ABNORMAL LOW (ref 36.0–46.0)
Hemoglobin: 11.1 g/dL — ABNORMAL LOW (ref 12.0–15.0)
MCH: 30.9 pg (ref 26.0–34.0)
MCHC: 33 g/dL (ref 30.0–36.0)
MCV: 93.6 fL (ref 80.0–100.0)
Platelets: 243 10*3/uL (ref 150–400)
RBC: 3.59 MIL/uL — ABNORMAL LOW (ref 3.87–5.11)
RDW: 12.8 % (ref 11.5–15.5)
WBC: 10.4 10*3/uL (ref 4.0–10.5)
nRBC: 0 % (ref 0.0–0.2)

## 2023-07-05 LAB — FERRITIN: Ferritin: 41 ng/mL (ref 11–307)

## 2023-07-05 LAB — VITAMIN B12: Vitamin B-12: 449 pg/mL (ref 180–914)

## 2023-07-05 MED ORDER — SODIUM CHLORIDE 0.9 % IV SOLN
Freq: Once | INTRAVENOUS | Status: AC
  Filled 2023-07-05: qty 250

## 2023-07-05 MED ORDER — SODIUM CHLORIDE 0.9 % IV SOLN
200.0000 mg | Freq: Once | INTRAVENOUS | Status: AC
  Administered 2023-07-05: 200 mg via INTRAVENOUS
  Filled 2023-07-05: qty 200

## 2023-07-05 NOTE — Progress Notes (Signed)
Patient thinks she may have a possible blood clot in the left lower leg. She states she's had 3 ultrasounds & they all showed no blood clot, but due to the fact that she is still having severe pain and issues with it, she would like further evaluation and management.

## 2023-07-05 NOTE — Progress Notes (Signed)
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  Courtney Murphy 67 y.o.  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  Patient thinks she may have a possible blood clot in the left lower leg. She states she's had 3 ultrasounds & they all showed no blood clot, but due to the fact that she is still having severe pain and issues with it, she would like further evaluation and management.  "Patient had surgery on the left ankle in May 2024.  Patient is currently on prophylactic Xarelto at 10 mg/day.   No blood in stools or black-colored stools.   Review of Systems  Constitutional:  Positive for malaise/fatigue. Negative for chills, diaphoresis, fever and weight loss.  HENT:  Negative for nosebleeds and sore throat.   Eyes:  Negative for double vision.  Respiratory:  Negative for cough, hemoptysis, sputum production, shortness of breath and wheezing.   Cardiovascular:  Negative for chest pain, palpitations, orthopnea and leg swelling.  Gastrointestinal:   Negative for abdominal pain, blood in stool, constipation, diarrhea, heartburn, melena, nausea and vomiting.  Genitourinary:  Negative for dysuria, frequency and urgency.  Musculoskeletal:  Positive for back pain and joint pain.  Skin: Negative.  Negative for itching and rash.  Neurological:  Negative for dizziness, tingling, focal weakness, weakness and headaches.  Endo/Heme/Allergies:  Bruises/bleeds easily.  Psychiatric/Behavioral:  Negative for depression. The patient is not nervous/anxious and does not have insomnia.      MEDICAL HISTORY:  Past Medical History:  Diagnosis Date   Acid reflux 10/11/2015   Anemia    RECEIVES IRON INFUSIONS   Arthritis    Asthma    WELL CONTROLLED   Depression    Diabetes mellitus without complication (HCC)    Fatty liver    Gout    History of kidney stones    History of methicillin resistant staphylococcus aureus (MRSA) 2016   Hypertension    H/O BEEN OFF BP MEDS FOR 1 YEAR   Hypothyroidism    Kidney stone    Knee pain    Left   Pulmonary emboli (HCC) 2011   Restless leg syndrome    Shoulder pain     SURGICAL HISTORY: Past Surgical History:  Procedure Laterality Date   ABDOMINAL HYSTERECTOMY     BACK SURGERY     BREAST REDUCTION SURGERY     CATARACT EXTRACTION W/PHACO Left 07/28/2017   Procedure: CATARACT EXTRACTION PHACO AND INTRAOCULAR LENS PLACEMENT (IOC) diabetic Left;  Surgeon: Lockie Mola, MD;  Location: [facility];  Service: Ophthalmology;  Laterality: Left;  Diabetic   CESAREAN SECTION  1990   CHOLECYSTECTOMY     CYSTOSCOPY/URETEROSCOPY/HOLMIUM LASER/STENT PLACEMENT Left 09/08/2017   Procedure: CYSTOSCOPY/URETEROSCOPY/HOLMIUM  LASER/STENT EXCHANGE;  Surgeon: Vanna Scotland, MD;  Location: [facility];  Service: Urology;  Laterality: Left;   CYSTOSCOPY/URETEROSCOPY/HOLMIUM LASER/STENT PLACEMENT Left 12/14/2018   Procedure: CYSTOSCOPY/URETEROSCOPY/HOLMIUM LASER/STENT PLACEMENT;  Surgeon: Vanna Scotland, MD;   Location: [facility];  Service: Urology;  Laterality: Left;   FOOT SURGERY     GASTRIC BYPASS     IR NEPHROSTOMY PLACEMENT LEFT  08/17/2017   KNEE ARTHROSCOPY Left    KNEE ARTHROSCOPY Left 07/19/2015   Procedure: ARTHROSCOPY KNEE;  Surgeon: Erin Sons, MD;  Location: [facility];  Service: Orthopedics;  Laterality: Left;   NEPHROLITHOTOMY Left 08/17/2017   Procedure: NEPHROLITHOTOMY PERCUTANEOUS WITH HOLMIUM LASER;  Surgeon: Vanna Scotland, MD;  Location: [facility];  Service: Urology;  Laterality: Left;   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/2021   THUMB ARTHROSCOPY     TONSILLECTOMY      SOCIAL HISTORY: Social History   Socioeconomic History   Marital status: Widowed    Spouse name: Not on file   Number of children: Not on file   Years of education: Not on file   Highest education level: Not on file  Occupational History   Not on file  Tobacco Use   Smoking status: Former    Current packs/day: 0.00    Average packs/day: 0.5 packs/day for 4.0 years (2.0 ttl pk-yrs)    Types: Cigarettes    Start date: 04/14/2011    Quit date: 04/14/2015    Years since quitting: 8.2   Smokeless tobacco: Never  Vaping Use   Vaping status: Never Used  Substance and Sexual Activity   Alcohol use: Not Currently    Comment: RARE   Drug use: No   Sexual activity: Never  Other Topics Concern   Not on file  Social History Narrative   Quit smoking > 30 years ago; rare alcohol. 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 Lives in [place]; with dog.    Social Determinants of Health   Financial Resource Strain: Low Risk  (08/20/2022)   Received from [facility], [facility]   Overall Financial Resource Strain (CARDIA)    Difficulty of Paying Living Expenses: Not hard at all  Food Insecurity: No Food Insecurity (08/20/2022)   Received from [facility], [facility]   Hunger Vital Sign    Worried About Running Out of Food in the Last Year: Never true     Ran Out of Food in the Last Year: Never true  Transportation Needs: No Transportation Needs (08/20/2022)   Received from [facility], [facility]   [facility] - Transportation    Lack of Transportation (Medical): No    Lack of Transportation (Non-Medical): No  Physical Activity: Inactive (08/20/2022)   Received from [facility], [facility]   Exercise Vital Sign    Days of Exercise per Week: 0 days    Minutes of Exercise per Session: 0 min  Stress: No Stress Concern Present (08/20/2022)   Received from [facility], [facility] - Occupational Stress Questionnaire    Feeling of Stress : Not at all  Social Connections: Socially Isolated (08/20/2022)   Received from [facility], [facility]   Social Connection and Isolation Panel [NHANES]    Frequency of Communication with Friends and Family: More than three times a week    Frequency of Social Gatherings with Friends and Family: More than three times a week  Attends Religious Services: Never    Active Member of Clubs or Organizations: No    Attends Banker Meetings: Never    Marital Status: Widowed  Intimate Partner Violence: Not At Risk (08/20/2022)   Received from [facility], [facility]   Humiliation, Afraid, Rape, and Kick questionnaire    Fear of Current or Ex-Partner: No    Emotionally Abused: No    Physically Abused: No    Sexually Abused: No    FAMILY HISTORY: Family History  Problem Relation Age of Onset   Cancer Mother        breast   Heart disease Father    Diabetes Father    Hypertension Father    Cancer Maternal Grandmother    Cancer Maternal Grandfather    Heart disease Paternal Grandmother    Heart disease Paternal Grandfather    Cancer Maternal Aunt    Cancer Maternal Uncle    Heart disease Paternal Aunt    Heart disease Paternal Uncle    Kidney cancer Neg Hx    Bladder Cancer Neg Hx     ALLERGIES:  is  allergic to sulfasalazine, nsaids, sulfa antibiotics, and penicillins.  MEDICATIONS:  Current Outpatient Medications  Medication Sig Dispense Refill   atomoxetine (STRATTERA) 40 MG capsule      atorvastatin (LIPITOR) 20 MG tablet Take 20 mg by mouth every morning.      Cholecalciferol 125 MCG (5000 UT) capsule Take 5,000 Units by mouth daily.     colchicine 0.6 MG tablet Take 1.2mg  (2 tablets) then 0.6mg  (1 tablet) 1 hour after. Then, take 1 tablet every day for 7 days. (Patient taking differently: Take 0.6 mg by mouth See admin instructions. Take 1.2mg  (2 tablets) then 0.6mg  (1 tablet) 1 hour after. Then, take 1 tablet every day for 7 days.  Pt takes this medication prn) 10 tablet 2   cyanocobalamin (,VITAMIN B-12,) 1000 MCG/ML injection Inject 1,000 mcg into the muscle once a week.     cyclobenzaprine (FLEXERIL) 10 MG tablet Take 10 mg by mouth every 8 (eight) hours.     DULoxetine HCl 40 MG CPEP Take 1 capsule by mouth daily.     fluticasone (FLONASE) 50 MCG/ACT nasal spray Place 2 sprays into both nostrils daily as needed for rhinitis or allergies.     furosemide (LASIX) 20 MG tablet Take 20 mg by mouth daily.     HYDROcodone-acetaminophen (NORCO/VICODIN) 5-325 MG tablet Take 1 tablet by mouth 3 (three) times daily as needed.     levothyroxine (SYNTHROID, LEVOTHROID) 100 MCG tablet Take 100 mcg by mouth daily before breakfast.     lisinopril (ZESTRIL) 5 MG tablet Take 5 mg by mouth daily.     OZEMPIC, 1 MG/DOSE, 4 MG/3ML SOPN Inject 1 mg into the skin once a week.     pramipexole (MIRAPEX) 0.5 MG tablet Take 0.5-1 mg by mouth at bedtime.      pregabalin (LYRICA) 50 MG capsule Take 50 mg by mouth 2 (two) times daily.     cephALEXin (KEFLEX) 500 MG capsule Take 1 capsule (500 mg total) by mouth 2 (two) times daily. 14 capsule 0   metFORMIN (GLUCOPHAGE-XR) 750 MG 24 hr tablet Take by mouth.     No current facility-administered medications for this visit.      Marland Kitchen  PHYSICAL  EXAMINATION:  Vitals:   07/05/23 1429  BP: 105/67  Pulse: 94  Resp: 18  Temp: (!) 96.6 F (35.9 C)  SpO2:  99%   Filed Weights   07/05/23 1429  Weight: 172 lb 6.4 oz (78.2 kg)    Physical Exam HENT:     Head: Normocephalic and atraumatic.     Mouth/Throat:     Pharynx: No oropharyngeal exudate.  Eyes:     Pupils: Pupils are equal, round, and reactive to light.  Cardiovascular:     Rate and Rhythm: Normal rate and regular rhythm.  Pulmonary:     Effort: Pulmonary effort is normal. No respiratory distress.     Breath sounds: Normal breath sounds. No wheezing.  Abdominal:     General: Bowel sounds are normal. There is no distension.     Palpations: Abdomen is soft. There is no mass.     Tenderness: There is no abdominal tenderness. There is no guarding or rebound.  Musculoskeletal:        General: No tenderness. Normal range of motion.     Cervical back: Normal range of motion and neck supple.  Skin:    General: Skin is warm.     Comments: Chronic bruising noted on the upper extremities.  No bruises on the trunk or lower extremities.  Neurological:     Mental Status: She is alert and oriented to person, place, and time.  Psychiatric:        Mood and Affect: Affect normal.      LABORATORY DATA:  I have reviewed the data as listed Lab Results  Component Value Date   WBC 10.4 07/05/2023   HGB 11.1 (L) 07/05/2023   HCT 33.6 (L) 07/05/2023   MCV 93.6 07/05/2023   PLT 243 07/05/2023   Recent Labs    08/06/22 0604 11/05/22 1343 12/09/22 0425 02/14/23 0726 07/05/23 1416  NA 140   < > 139 134* 135  K 4.1   < > 3.8 4.2 4.2  CL 104   < > 103 100 99  CO2 27   < > 28 26 26   GLUCOSE 105*   < > 124* 185* 177*  BUN 17   < > 16 18 24*  CREATININE 1.04*   < > 0.76 0.93 0.90  CALCIUM 9.4   < > 9.3 8.8* 8.8*  GFRNONAA 59*   < > >60 >60 >60  PROT 7.1  --  6.9 7.0  --   ALBUMIN 3.7  --  3.7 3.9  --   AST 23  --  20 28  --   ALT 22  --  18 24  --   ALKPHOS 113  --   157* 135*  --   BILITOT 0.6  --  0.6 0.6  --    < > = values in this interval not displayed.    RADIOGRAPHIC STUDIES: I have personally reviewed the radiological images as listed and agreed with the findings in the report. No results found.   ASSESSMENT & PLAN:   Antiphospholipid antibody positive #Iron deficiency anemia [ gastric bypass-]-low iron saturation/ferritin.  S/p IV Venofer;   # Today hemoglobin is 11.5- proceed venofer.  # b12 def-continue home B12 injections.  # Left leg pain/calf; and swelling around ankle [facility] 2024- s/p anle fusion]- s/p [place] in clinic as per pt- NO blood clot. On xarelto 10 mg/day [per ortho]. # refer to vascular re: varicose veins/left leg swelling   # DISPOSITION: # refer to vascular re: varicose veins/left leg swelling # Venofer today # venofer in 1 week-  # follow up in 4 months-MD; labs- cbc/bmp/iiron studies/ferritin; b12 levels /  possible venofer-Dr.B   All questions were answered. The patient knows to call the clinic with any problems, questions or concerns.   Earna Coder, MD 07/05/2023 3:28 PM

## 2023-07-05 NOTE — Assessment & Plan Note (Addendum)
#  Iron deficiency anemia [ gastric bypass-]-low iron saturation/ferritin.  S/p IV Venofer;   # Today hemoglobin is 11.5- proceed venofer.  # b12 def-continue home B12 injections.  # Left leg pain/calf; and swelling around ankle [facility] 2024- s/p anle fusion]- s/p [place] in clinic as per pt- NO blood clot. On xarelto 10 mg/day [per ortho]. # refer to vascular re: varicose veins/left leg swelling   # DISPOSITION: # refer to vascular re: varicose veins/left leg swelling # Venofer today # venofer in 1 week-  # follow up in 4 months-MD; labs- cbc/bmp/iiron studies/ferritin; b12 levels Leveda Anna venofer-Dr.B

## 2023-07-05 NOTE — Progress Notes (Signed)
Pt has been educated and understands. Pt declined to stay 30 mins after iron infusion. VSS.

## 2023-07-12 ENCOUNTER — Inpatient Hospital Stay: Payer: Medicare PPO

## 2023-07-12 VITALS — BP 117/72 | HR 97 | Temp 98.1°F

## 2023-07-12 DIAGNOSIS — D509 Iron deficiency anemia, unspecified: Secondary | ICD-10-CM | POA: Diagnosis not present

## 2023-07-12 DIAGNOSIS — K909 Intestinal malabsorption, unspecified: Secondary | ICD-10-CM

## 2023-07-12 MED ORDER — SODIUM CHLORIDE 0.9 % IV SOLN
Freq: Once | INTRAVENOUS | Status: AC
  Filled 2023-07-12: qty 250

## 2023-07-12 MED ORDER — SODIUM CHLORIDE 0.9 % IV SOLN
200.0000 mg | Freq: Once | INTRAVENOUS | Status: AC
  Administered 2023-07-12: 200 mg via INTRAVENOUS
  Filled 2023-07-12: qty 200

## 2023-08-06 ENCOUNTER — Other Ambulatory Visit (INDEPENDENT_AMBULATORY_CARE_PROVIDER_SITE_OTHER): Payer: Self-pay | Admitting: Nurse Practitioner

## 2023-08-06 DIAGNOSIS — M79605 Pain in left leg: Secondary | ICD-10-CM

## 2023-08-06 DIAGNOSIS — M7989 Other specified soft tissue disorders: Secondary | ICD-10-CM

## 2023-08-09 ENCOUNTER — Ambulatory Visit (INDEPENDENT_AMBULATORY_CARE_PROVIDER_SITE_OTHER): Payer: Medicare PPO | Admitting: Nurse Practitioner

## 2023-08-09 ENCOUNTER — Encounter (INDEPENDENT_AMBULATORY_CARE_PROVIDER_SITE_OTHER): Payer: Self-pay | Admitting: Nurse Practitioner

## 2023-08-09 ENCOUNTER — Ambulatory Visit (INDEPENDENT_AMBULATORY_CARE_PROVIDER_SITE_OTHER): Payer: Medicare PPO

## 2023-08-09 VITALS — BP 119/80 | HR 90 | Resp 18 | Ht 64.0 in | Wt 168.8 lb

## 2023-08-09 DIAGNOSIS — M79605 Pain in left leg: Secondary | ICD-10-CM

## 2023-08-09 DIAGNOSIS — E119 Type 2 diabetes mellitus without complications: Secondary | ICD-10-CM | POA: Diagnosis not present

## 2023-08-09 DIAGNOSIS — M7989 Other specified soft tissue disorders: Secondary | ICD-10-CM

## 2023-08-09 DIAGNOSIS — Z9889 Other specified postprocedural states: Secondary | ICD-10-CM

## 2023-08-09 DIAGNOSIS — R760 Raised antibody titer: Secondary | ICD-10-CM | POA: Diagnosis not present

## 2023-08-09 LAB — VAS US ABI WITH/WO TBI
Left ABI: 1.27
Right ABI: 1.31

## 2023-08-09 NOTE — Progress Notes (Signed)
Subjective:    Patient ID: Courtney Murphy, female    DOB: 21-Feb-1956, 67 y.o.   MRN: [id number] Chief Complaint  Patient presents with   New Patient (Initial Visit)    NP consult with ABI & Reflux  Antiphospholipid antibody positive varicose veins/left leg swelling      Courtney Murphy is a 67 year old female who presents today as a referral from Dr. Donneta Romberg in regards to swelling of her left lower extremity.  The patient notes that the swelling began shortly after her ankle surgery that she had little over a month ago.  She has been wearing medical grade compression socks and has helped the swelling but the ankle area still continues to swell.  Additionally she has significant issues with cramping in the left leg in addition to some more noticeable varicosities that have become prominent following her surgery.  Unfortunately due to her ankle surgery her range of motion is quite limited decreasing use of her calf.  Currently she denies any open wounds or ulcerations.  She denies classic claudication-like symptoms or rest pain.  Currently no issues with swelling in the right lower extremity.  She also has a history of antiphospholipid syndrome.  Initially she was on Coumadin but has been placed on Xarelto recently and compliant with that therapy.  She has had multiple DVT studies which have been negative prior to her visit today.  Today noninvasive study showed no evidence of DVT in the left lower extremity.  No evidence of deep venous insufficiency or superficial venous reflux.  She had an ABI of 1.31 on the right and 1.27 on the left.  She had strong triphasic tibial artery waveforms bilaterally with strong toe waveforms bilaterally.    Review of Systems  Cardiovascular:  Positive for leg swelling.  Musculoskeletal:  Positive for arthralgias, back pain and gait problem.  All other systems reviewed and are negative.      Objective:   Physical Exam Vitals reviewed.  HENT:     Head:  Normocephalic.  Cardiovascular:     Rate and Rhythm: Normal rate and regular rhythm.     Pulses:          Dorsalis pedis pulses are detected w/ Doppler on the right side and detected w/ Doppler on the left side.       Posterior tibial pulses are detected w/ Doppler on the right side and detected w/ Doppler on the left side.  Pulmonary:     Effort: Pulmonary effort is normal.  Musculoskeletal:     Left lower leg: Edema present.  Skin:    General: Skin is warm and dry.  Neurological:     Mental Status: She is alert and oriented to person, place, and time.     Gait: Gait abnormal.  Psychiatric:        Mood and Affect: Mood normal.        Behavior: Behavior normal.        Thought Content: Thought content normal.        Judgment: Judgment normal.     BP 119/80 (BP Location: Left Arm)   Pulse 90   Resp 18   Ht 5\' 4"  (1.626 m)   Wt 168 lb 12.8 oz (76.6 kg)   BMI 28.97 kg/m   Past Medical History:  Diagnosis Date   Acid reflux 10/11/2015   Anemia    RECEIVES IRON INFUSIONS   Arthritis    Asthma    WELL CONTROLLED   Depression  Diabetes mellitus without complication (HCC)    Fatty liver    Gout    History of kidney stones    History of methicillin resistant staphylococcus aureus (MRSA) 2016   Hypertension    H/O BEEN OFF BP MEDS FOR 1 YEAR   Hypothyroidism    Kidney stone    Knee pain    Left   Pulmonary emboli (HCC) 2011   Restless leg syndrome    Shoulder pain     Social History   Socioeconomic History   Marital status: Widowed    Spouse name: Not on file   Number of children: Not on file   Years of education: Not on file   Highest education level: Not on file  Occupational History   Not on file  Tobacco Use   Smoking status: Former    Current packs/day: 0.00    Average packs/day: 0.5 packs/day for 4.0 years (2.0 ttl pk-yrs)    Types: Cigarettes    Start date: 04/14/2011    Quit date: 04/14/2015    Years since quitting: 8.3   Smokeless tobacco: Never   Vaping Use   Vaping status: Never Used  Substance and Sexual Activity   Alcohol use: Not Currently    Comment: RARE   Drug use: No   Sexual activity: Never  Other Topics Concern   Not on file  Social History Narrative   Quit smoking > 30 years ago; rare alcohol. 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 Lives in [place]; with dog.    Social Determinants of Health   Financial Resource Strain: Low Risk  (08/20/2022)   Received from [facility], [facility]   Overall Financial Resource Strain (CARDIA)    Difficulty of Paying Living Expenses: Not hard at all  Food Insecurity: No Food Insecurity (08/20/2022)   Received from [facility], [facility]   Hunger Vital Sign    Worried About Running Out of Food in the Last Year: Never true    Ran Out of Food in the Last Year: Never true  Transportation Needs: No Transportation Needs (08/20/2022)   Received from [facility], [facility]   [facility] - Transportation    Lack of Transportation (Medical): No    Lack of Transportation (Non-Medical): No  Physical Activity: Inactive (08/20/2022)   Received from [facility], [facility]   Exercise Vital Sign    Days of Exercise per Week: 0 days    Minutes of Exercise per Session: 0 min  Stress: No Stress Concern Present (08/20/2022)   Received from [facility], [facility] of Occupational Health - Occupational Stress Questionnaire    Feeling of Stress : Not at all  Social Connections: Socially Isolated (08/20/2022)   Received from [facility], [facility]   Social Connection and Isolation Panel [NHANES]    Frequency of Communication with Friends and Family: More than three times a week    Frequency of Social Gatherings with Friends and Family: More than three times a week    Attends Religious Services: Never    Database administrator or Organizations: No    Attends Banker Meetings:  Never    Marital Status: Widowed  Intimate Partner Violence: Not At Risk (08/20/2022)   Received from [facility], [facility]   Humiliation, Afraid, Rape, and Kick questionnaire    Fear of Current or Ex-Partner:  No    Emotionally Abused: No    Physically Abused: No    Sexually Abused: No    Past Surgical History:  Procedure Laterality Date   ABDOMINAL HYSTERECTOMY     BACK SURGERY     BREAST REDUCTION SURGERY     CATARACT EXTRACTION W/PHACO Left 07/28/2017   Procedure: CATARACT EXTRACTION PHACO AND INTRAOCULAR LENS PLACEMENT (IOC) diabetic Left;  Surgeon: Lockie Mola, MD;  Location: [facility];  Service: Ophthalmology;  Laterality: Left;  Diabetic   CESAREAN SECTION  1990   CHOLECYSTECTOMY     CYSTOSCOPY/URETEROSCOPY/HOLMIUM LASER/STENT PLACEMENT Left 09/08/2017   Procedure: CYSTOSCOPY/URETEROSCOPY/HOLMIUM LASER/STENT EXCHANGE;  Surgeon: Vanna Scotland, MD;  Location: [facility];  Service: Urology;  Laterality: Left;   CYSTOSCOPY/URETEROSCOPY/HOLMIUM LASER/STENT PLACEMENT Left 12/14/2018   Procedure: CYSTOSCOPY/URETEROSCOPY/HOLMIUM LASER/STENT PLACEMENT;  Surgeon: Vanna Scotland, MD;  Location: [facility];  Service: Urology;  Laterality: Left;   FOOT SURGERY     GASTRIC BYPASS     IR NEPHROSTOMY PLACEMENT LEFT  08/17/2017   KNEE ARTHROSCOPY Left    KNEE ARTHROSCOPY Left 07/19/2015   Procedure: ARTHROSCOPY KNEE;  Surgeon: Erin Sons, MD;  Location: [facility];  Service: Orthopedics;  Laterality: Left;   NEPHROLITHOTOMY Left 08/17/2017   Procedure: NEPHROLITHOTOMY PERCUTANEOUS WITH HOLMIUM LASER;  Surgeon: Vanna Scotland, MD;  Location: [facility];  Service: Urology;  Laterality: Left;   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/2021   THUMB ARTHROSCOPY     TONSILLECTOMY      Family History  Problem Relation Age of Onset   Cancer Mother        breast   Heart disease Father    Diabetes Father    Hypertension Father    Cancer Maternal Grandmother    Cancer  Maternal Grandfather    Heart disease Paternal Grandmother    Heart disease Paternal Grandfather    Cancer Maternal Aunt    Cancer Maternal Uncle    Heart disease Paternal Aunt    Heart disease Paternal Uncle    Kidney cancer Neg Hx    Bladder Cancer Neg Hx     Allergies  Allergen Reactions   Sulfasalazine Hives   Nsaids     Avoids because of gastric bypass   Sulfa Antibiotics Hives   Penicillins Hives and Rash    Has patient had a PCN reaction causing immediate rash, facial/tongue/throat swelling, SOB or lightheadedness with hypotension: No Has patient had a PCN reaction causing severe rash involving mucus membranes or skin necrosis: No Has patient had a PCN reaction that required hospitalization: No Has patient had a PCN reaction occurring within the last 10 years: No If all of the above answers are "NO", then may proceed with Cephalosporin use.        Latest Ref Rng & Units 07/05/2023    2:16 PM 02/14/2023    7:26 AM 12/09/2022    4:25 AM  CBC  WBC 4.0 - 10.5 K/uL 10.4  7.4  11.6   Hemoglobin 12.0 - 15.0 g/dL 16.1  09.6  04.5   Hematocrit 36.0 - 46.0 % 33.6  34.0  35.4   Platelets 150 - 400 K/uL 243  223  320       CMP     Component Value Date/Time   NA 135 07/05/2023 1416   NA 143 05/13/2022 1431   K 4.2 07/05/2023 1416   CL 99 07/05/2023 1416   CO2 26 07/05/2023 1416   GLUCOSE 177 (H) 07/05/2023 1416   BUN 24 (H) 07/05/2023  1416   BUN 17 05/13/2022 1431   CREATININE 0.90 07/05/2023 1416   CALCIUM 8.8 (L) 07/05/2023 1416   PROT 7.0 02/14/2023 0726   PROT 6.6 07/17/2020 1242   ALBUMIN 3.9 02/14/2023 0726   ALBUMIN 4.4 07/17/2020 1242   AST 28 02/14/2023 0726   ALT 24 02/14/2023 0726   ALKPHOS 135 (H) 02/14/2023 0726   BILITOT 0.6 02/14/2023 0726   BILITOT 0.3 07/17/2020 1242   EGFR 88 05/13/2022 1431   GFRNONAA >60 07/05/2023 1416     VAS [place] ABI WITH/WO TBI  Result Date: 08/09/2023  LOWER EXTREMITY DOPPLER STUDY Patient Name:  YUKTA AVINO  Date  of Exam:   08/09/2023 Medical Rec #: [id number]        Accession #:    [id number] Date of Birth: 1956/09/08        Patient Gender: F Patient Age:   69 years Exam Location:  [facility] Procedure:      VAS [place] ABI WITH/WO TBI Referring Phys: Levora Dredge --------------------------------------------------------------------------------  Indications: Rest pain.  Performing Technologist: Debbe Bales RVS  Examination Guidelines: A complete evaluation includes at minimum, Doppler waveform signals and systolic blood pressure reading at the level of bilateral brachial, anterior tibial, and posterior tibial arteries, when vessel segments are accessible. Bilateral testing is considered an integral part of a complete examination. Photoelectric Plethysmograph (PPG) waveforms and toe systolic pressure readings are included as required and additional duplex testing as needed. Limited examinations for reoccurring indications may be performed as noted.  ABI Findings: +---------+------------------+-----+---------+--------+ Right    Rt Pressure (mmHg)IndexWaveform Comment  +---------+------------------+-----+---------+--------+ Brachial 127                                      +---------+------------------+-----+---------+--------+ ATA      153               1.20 triphasic         +---------+------------------+-----+---------+--------+ PTA      166               1.31 triphasic         +---------+------------------+-----+---------+--------+ Great Toe131               1.03 Normal            +---------+------------------+-----+---------+--------+ +---------+------------------+-----+---------+-------+ Left     Lt Pressure (mmHg)IndexWaveform Comment +---------+------------------+-----+---------+-------+ Brachial 124                                     +---------+------------------+-----+---------+-------+ ATA      153               1.20 triphasic         +---------+------------------+-----+---------+-------+ PTA      161               1.27 triphasic        +---------+------------------+-----+---------+-------+ Great Toe154               1.21 Normal           +---------+------------------+-----+---------+-------+ +-------+-----------+-----------+------------+------------+ ABI/TBIToday's ABIToday's TBIPrevious ABIPrevious TBI +-------+-----------+-----------+------------+------------+ Right  1.31       1.03                                +-------+-----------+-----------+------------+------------+  Left   1.27       1.21                                +-------+-----------+-----------+------------+------------+  Summary: Right: Resting right ankle-brachial index is within normal range. The right toe-brachial index is normal. Left: Resting left ankle-brachial index is within normal range. The left toe-brachial index is normal.  *See table(s) above for measurements and observations.  Electronically signed by Levora Dredge MD on 08/09/2023 at 9:43:11 AM.    Final        Assessment & Plan:   1. Antiphospholipid antibody positive Patient will continue to follow with hematology for management of anticoagulation.  2. Type 2 diabetes mellitus without complication, without long-term current use of insulin (HCC) Continue hypoglycemic medications as already ordered, these medications have been reviewed and there are no changes at this time.  Hgb A1C to be monitored as already arranged by primary service  3. History of ankle surgery While the surgery itself may have went very well I do believe the cause of her swelling is her recent surgery.  With inflammatory and healing changes ongoing this is a common culprit of swelling that we see for our patients as they are in the process of healing.  I suspect that even with the good result of the ankle surgery she may have some component of swelling ongoing but it may be lessened with  conservative therapy.  4. Leg swelling As noted above I believe a significant cause of her swelling is related to her recent ankle surgery.  Not as a fault of the surgery itself but just as a natural consequence.  I also believe this is why her veins are little bit more prominent as well.  She has been utilizing medical grade compression and I have recommended that she continue this.  She is advised to place the first thing in the morning and to take it off before bedtime.  She specifically advised not to sleep in this.  She should also continue with elevation of her lower extremities.  Activity would also be very helpful for her but given her limited range of motion this is more difficult.  I also believe that part of the cause of her swelling is her inability to move as she has lost the ability of the calf muscles to help with decrease in swelling.  This is also likely contributing to her cramping as well.  Following discussion with the patient will continue with conservative therapy for about the next 3 months and have her follow-up to evaluate progress with conservative therapy.  If you continue to have issues with the veins we may discuss treatment with sclerotherapy   Current Outpatient Medications on File Prior to Visit  Medication Sig Dispense Refill   atomoxetine (STRATTERA) 40 MG capsule      atorvastatin (LIPITOR) 20 MG tablet Take 20 mg by mouth every morning.      Cholecalciferol 125 MCG (5000 UT) capsule Take 5,000 Units by mouth daily.     cyanocobalamin (,VITAMIN B-12,) 1000 MCG/ML injection Inject 1,000 mcg into the muscle once a week.     cyclobenzaprine (FLEXERIL) 10 MG tablet Take 10 mg by mouth every 8 (eight) hours.     DULoxetine HCl 40 MG CPEP Take 1 capsule by mouth daily.     fluticasone (FLONASE) 50 MCG/ACT nasal spray Place 2 sprays into both nostrils daily as needed for  rhinitis or allergies.     furosemide (LASIX) 20 MG tablet Take 20 mg by mouth daily.      HYDROcodone-acetaminophen (NORCO/VICODIN) 5-325 MG tablet Take 1 tablet by mouth 3 (three) times daily as needed.     levothyroxine (SYNTHROID, LEVOTHROID) 100 MCG tablet Take 100 mcg by mouth daily before breakfast.     lisinopril (ZESTRIL) 5 MG tablet Take 5 mg by mouth daily.     OZEMPIC, 1 MG/DOSE, 4 MG/3ML SOPN Inject 1 mg into the skin once a week.     pramipexole (MIRAPEX) 0.5 MG tablet Take 0.5-1 mg by mouth at bedtime.      pregabalin (LYRICA) 50 MG capsule Take 50 mg by mouth 2 (two) times daily.     metFORMIN (GLUCOPHAGE-XR) 750 MG 24 hr tablet Take by mouth.     No current facility-administered medications on file prior to visit.    There are no Patient Instructions on file for this visit. No follow-ups on file.   Georgiana Spinner, NP

## 2023-09-08 ENCOUNTER — Ambulatory Visit (INDEPENDENT_AMBULATORY_CARE_PROVIDER_SITE_OTHER): Payer: Medicare PPO

## 2023-09-08 ENCOUNTER — Ambulatory Visit: Payer: Medicare PPO | Admitting: Podiatry

## 2023-09-08 ENCOUNTER — Encounter: Payer: Self-pay | Admitting: Podiatry

## 2023-09-08 DIAGNOSIS — M7751 Other enthesopathy of right foot: Secondary | ICD-10-CM | POA: Diagnosis not present

## 2023-09-08 MED ORDER — TRIAMCINOLONE ACETONIDE 40 MG/ML IJ SUSP
20.0000 mg | Freq: Once | INTRAMUSCULAR | Status: AC
Start: 2023-09-08 — End: 2023-09-08
  Administered 2023-09-08: 20 mg

## 2023-09-08 NOTE — Progress Notes (Signed)
She presents today after having not seen her for couple of years with a chief concern of pain to the right lateral ankle.  She states that her left ankle was fused by an orthopedic physician in July 2024.  She states that the right ankle as she points to the sinus tarsi area has been painful for the past few days and seems to be worsening.  Objective: Vital signs are stable alert and oriented x 3.  Pulses are palpable.  Edema to the left lower extremity.  Right lower extremity demonstrates minimal edema she does have tenderness overlying the sinus tarsi and on subtalar joint and range of motion.  Radiographs do not demonstrate any significant osseous abnormalities other than some mild demineralization.  Retention of internal fixation first metatarsal and osteoarthritic changes at the second and third metatarsal phalangeal joints.  Assessment: Subtalar joint capsulitis right.  Plan: Discussed etiology pathology conservative surgical therapies injected the sinus tarsi today right 20 mg Kenalog 5 mg Marcaine point of maximal tenderness.

## 2023-10-04 ENCOUNTER — Ambulatory Visit
Admission: EM | Admit: 2023-10-04 | Discharge: 2023-10-04 | Disposition: A | Discharge status: Home / Self Care | Payer: Medicare PPO

## 2023-10-04 ENCOUNTER — Encounter: Payer: Self-pay | Admitting: Emergency Medicine

## 2023-10-04 DIAGNOSIS — S61412A Laceration without foreign body of left hand, initial encounter: Secondary | ICD-10-CM

## 2023-10-04 NOTE — ED Provider Notes (Signed)
[facility]    CSN: [id number] Arrival date & time: 10/04/23  5784      History   Chief Complaint Chief Complaint  Patient presents with   Fall    HPI Courtney Murphy is a 67 y.o. female presenting for skin tear of the dorsal left hand.  Patient reports she was walking and tripped over a curb this morning and fell skinning her right hand.  She denies any significant pain in the hand bones.  No pain when making a fist, numbness, tingling.  No significant bleeding.  Denies head injury or other injuries.  She reports getting tetanus immunization updated a few weeks ago.  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   Anemia 07/17/2020   Asthma 07/17/2020   Degeneration of lumbar intervertebral disc 07/17/2020   Degenerative joint disease of hand 07/17/2020   Dyslipidemia 07/17/2020   Hip pain 07/17/2020   History of lumbar laminectomy 07/17/2020   Hypertensive disorder 07/17/2020   Joint pain 07/17/2020   Depression 07/17/2020   Obesity 07/17/2020   Sprain of left wrist 07/10/2020   Spinal stenosis of lumbar region 01/01/2020   Constipation 08/11/2019   Benign essential hypertension 05/23/2019   Unstable angina (HCC) 01/14/2019   Closed fracture of distal end of radius 10/03/2018   Kidney stone 08/17/2017   Iron deficiency 08/12/2017    RLS (restless legs syndrome) 07/26/2017   Type 2 diabetes mellitus without complication, without long-term current use of insulin (HCC) 04/22/2017   Chest pain 09/30/2016   Acid reflux 10/11/2015   Adult hypothyroidism 10/11/2015   ADHD (attention deficit hyperactivity disorder) 10/11/2015   Radiculopathy, lumbar region 08/13/2015   Tear of meniscus of knee 07/11/2015   Left shoulder pain 06/18/2015   Synovitis of knee 05/13/2015   Low back pain 04/30/2015   Acute bronchitis due to infection 11/15/2014   Bariatric surgery status 10/22/2014   History of colonic polyps 08/16/2014   Routine history and physical examination of adult 06/14/2014   CMC arthritis 05/18/2014   De Quervain's tenosynovitis, left 05/18/2014   Herpes genitalis 04/10/2014   Insomnia 04/10/2014   Lateral epicondylitis 02/26/2014   Acne 10/06/2013   HLD (hyperlipidemia) 08/18/2013   B12 deficiency 05/25/2013   Trochanteric bursitis, right hip 05/03/2013    Past Surgical History:  Procedure Laterality Date   ABDOMINAL HYSTERECTOMY     BACK SURGERY     BREAST REDUCTION SURGERY     CATARACT EXTRACTION W/PHACO Left 07/28/2017   Procedure: CATARACT EXTRACTION PHACO AND INTRAOCULAR LENS PLACEMENT (IOC) diabetic Left;  Surgeon: Lockie Mola, MD;  Location: [facility];  Service: Ophthalmology;  Laterality: Left;  Diabetic   CESAREAN SECTION  1990   CHOLECYSTECTOMY     CYSTOSCOPY/URETEROSCOPY/HOLMIUM LASER/STENT PLACEMENT Left 09/08/2017   Procedure: CYSTOSCOPY/URETEROSCOPY/HOLMIUM LASER/STENT EXCHANGE;  Surgeon: Vanna Scotland, MD;  Location: [facility];  Service: Urology;  Laterality: Left;   CYSTOSCOPY/URETEROSCOPY/HOLMIUM LASER/STENT PLACEMENT Left 12/14/2018   Procedure: CYSTOSCOPY/URETEROSCOPY/HOLMIUM LASER/STENT PLACEMENT;  Surgeon: Vanna Scotland, MD;  Location: [facility];  Service: Urology;  Laterality: Left;   FOOT SURGERY     GASTRIC BYPASS     IR NEPHROSTOMY PLACEMENT LEFT  08/17/2017    KNEE ARTHROSCOPY Left    KNEE ARTHROSCOPY Left 07/19/2015   Procedure: ARTHROSCOPY KNEE;  Surgeon: Erin Sons, MD;  Location: [facility];  Service: Orthopedics;  Laterality: Left;   NEPHROLITHOTOMY Left 08/17/2017   Procedure: NEPHROLITHOTOMY PERCUTANEOUS WITH HOLMIUM LASER;  Surgeon: Vanna Scotland, MD;  Location: [facility];  Service: Urology;  Laterality: Left;   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/2021   THUMB ARTHROSCOPY     TONSILLECTOMY      OB History   No obstetric history on file.      Home Medications    Prior to Admission medications   Medication Sig Start Date End Date Taking? Authorizing Provider  doxepin (SINEQUAN) 10 MG capsule Take by mouth. 09/20/23 09/19/24 Yes [provider]  atomoxetine (STRATTERA) 40 MG capsule  03/13/20   [provider]  atorvastatin (LIPITOR) 20 MG tablet Take 20 mg by mouth every morning.  06/27/18   [provider]  Cholecalciferol 125 MCG (5000 UT) capsule Take 5,000 Units by mouth daily. 12/08/22 12/08/23  [provider]  cyanocobalamin (,VITAMIN B-12,) 1000 MCG/ML injection Inject 1,000 mcg into the muscle once a week. 06/30/20   [provider]  cyclobenzaprine (FLEXERIL) 10 MG tablet Take 10 mg by mouth every 8 (eight) hours. 06/14/20   [provider]  DULoxetine HCl 40 MG CPEP Take 1 capsule by mouth daily. 05/26/21   [provider]  fluticasone (FLONASE) 50 MCG/ACT nasal spray Place 2 sprays into both nostrils daily as needed for rhinitis or allergies. 08/25/22   [provider]  furosemide (LASIX) 20 MG tablet Take 20 mg by mouth daily. 02/03/23   [provider]  HYDROcodone-acetaminophen (NORCO/VICODIN) 5-325 MG tablet Take 1 tablet by mouth 3 (three) times daily as needed. 07/07/20   [provider]  levothyroxine (SYNTHROID, LEVOTHROID) 100 MCG tablet Take 100 mcg by mouth daily before breakfast.    [provider]  lisinopril (ZESTRIL) 5 MG  tablet Take 5 mg by mouth daily. 05/11/22   [provider]  metFORMIN (GLUCOPHAGE-XR) 750 MG 24 hr tablet Take by mouth. 03/10/21 02/14/23  [provider]  OZEMPIC, 1 MG/DOSE, 4 MG/3ML SOPN Inject 1 mg into the skin once a week. 02/05/23 02/05/24  [provider]  pramipexole (MIRAPEX) 0.5 MG tablet Take 0.5-1 mg by mouth at bedtime.     [provider]  pregabalin (LYRICA) 50 MG capsule Take 50 mg by mouth 2 (two) times daily. 01/20/23   [provider]    Family History Family History  Problem Relation Age of Onset   Cancer Mother        breast   Heart disease Father    Diabetes Father    Hypertension Father    Cancer Maternal Grandmother    Cancer Maternal Grandfather    Heart disease Paternal Grandmother    Heart disease Paternal Grandfather    Cancer Maternal Aunt    Cancer Maternal Uncle    Heart disease Paternal Aunt  Heart disease Paternal Uncle    Kidney cancer Neg Hx    Bladder Cancer Neg Hx     Social History Social History   Tobacco Use   Smoking status: Former    Current packs/day: 0.00    Average packs/day: 0.5 packs/day for 4.0 years (2.0 ttl pk-yrs)    Types: Cigarettes    Start date: 04/14/2011    Quit date: 04/14/2015    Years since quitting: 8.4   Smokeless tobacco: Never  Vaping Use   Vaping status: Never Used  Substance Use Topics   Alcohol use: Not Currently    Comment: RARE   Drug use: No     Allergies   Sulfasalazine, Nsaids, Sulfa antibiotics, and Penicillins   Review of Systems Review of Systems  Musculoskeletal:  Negative for arthralgias and joint swelling.  Skin:  Positive for wound. Negative for color change.  Neurological:  Negative for weakness and numbness.     Physical Exam Triage Vital Signs ED Triage Vitals  Encounter Vitals Group     BP 10/04/23 0853 130/74     Systolic BP Percentile --      Diastolic BP Percentile --      Pulse Rate 10/04/23 0853 90     Resp 10/04/23 0853 18      Temp 10/04/23 0853 98.4 F (36.9 C)     Temp Source 10/04/23 0853 Oral     SpO2 10/04/23 0853 100 %     Weight --      Height --      Head Circumference --      Peak Flow --      Pain Score 10/04/23 0851 2     Pain Loc --      Pain Education --      Exclude from Growth Chart --    No data found.  Updated Vital Signs BP 130/74 (BP Location: Left Arm)   Pulse 90   Temp 98.4 F (36.9 C) (Oral)   Resp 18   SpO2 100%   Physical Exam Vitals and nursing note reviewed.  Constitutional:      General: She is not in acute distress.    Appearance: Normal appearance. She is not ill-appearing or toxic-appearing.  HENT:     Head: Normocephalic and atraumatic.  Eyes:     General: No scleral icterus.       Right eye: No discharge.        Left eye: No discharge.     Conjunctiva/sclera: Conjunctivae normal.  Cardiovascular:     Rate and Rhythm: Normal rate and regular rhythm.     Pulses: Normal pulses.  Pulmonary:     Effort: Pulmonary effort is normal. No respiratory distress.  Musculoskeletal:     Cervical back: Neck supple.  Skin:    General: Skin is dry.     Findings: Laceration present.     Comments: Large skin flap/tear of dorsal left hand (4.5 cm), 2 cm laceration/skin flap lateral left wrist.  Neurological:     General: No focal deficit present.     Mental Status: She is alert. Mental status is at baseline.     Motor: No weakness.     Gait: Gait normal.  Psychiatric:        Mood and Affect: Mood normal.      UC Treatments / Results  Labs (all labs ordered are listed, but only abnormal results are displayed) Labs Reviewed - No data to display  EKG  Radiology No results found.  Procedures Procedures (including critical care time)  Medications Ordered in UC Medications - No data to display  Initial Impression / Assessment and Plan / UC Course  I have reviewed the triage vital signs and the nursing notes.  Pertinent labs & imaging results that were  available during my care of the patient were reviewed by me and considered in my medical decision making (see chart for details).   67 year old female presents for skin tear flap laceration of left dorsal hand and left lateral wrist since this morning.  Denies bony hand pain, swelling, bruising.  No other injuries reported.  Tetanus immunization up-to-date.  Advised wound care and laceration repair with tissue adhesive.  Patient gives consent for this.  Area cleaned with wound cleanser.  Pulled back the flap to thoroughly cleanse the area then used Dermabond to seal majority of both wounds.  We did for the skin adhesive to fully dry and then applied nonadherent bandages and Coban.  Patient tolerated this well.  Discussed wound care guidelines with her.  Reviewed return and ED precautions for her injury.  Final Clinical Impressions(s) / UC Diagnoses   Final diagnoses:  Skin tear of left hand without complication, initial encounter     Discharge Instructions      -Do not get the glue wet or it can come off sooner than intended. Will come off on its own within about a week -If increased swelling, pain, redness seek re-evaluation for possible infection   ED Prescriptions   None    PDMP not reviewed this encounter.   Shirlee Latch, PA-C 10/04/23 [phone]

## 2023-10-04 NOTE — ED Triage Notes (Signed)
Pt tripped over the curb this morning and fell. She has a skin tear to her left hand.

## 2023-10-04 NOTE — Discharge Instructions (Addendum)
-  Do not get the glue wet or it can come off sooner than intended. Will come off on its own within about a week -If increased swelling, pain, redness seek re-evaluation for possible infection

## 2023-10-05 ENCOUNTER — Other Ambulatory Visit: Payer: Self-pay

## 2023-10-05 ENCOUNTER — Encounter: Payer: Self-pay | Admitting: *Deleted

## 2023-10-05 ENCOUNTER — Emergency Department: Payer: Medicare PPO

## 2023-10-05 DIAGNOSIS — S0993XA Unspecified injury of face, initial encounter: Secondary | ICD-10-CM | POA: Diagnosis present

## 2023-10-05 DIAGNOSIS — W08XXXA Fall from other furniture, initial encounter: Secondary | ICD-10-CM | POA: Insufficient documentation

## 2023-10-05 DIAGNOSIS — R040 Epistaxis: Secondary | ICD-10-CM | POA: Insufficient documentation

## 2023-10-05 DIAGNOSIS — S01511A Laceration without foreign body of lip, initial encounter: Secondary | ICD-10-CM | POA: Insufficient documentation

## 2023-10-05 DIAGNOSIS — S0990XA Unspecified injury of head, initial encounter: Secondary | ICD-10-CM | POA: Insufficient documentation

## 2023-10-05 LAB — BASIC METABOLIC PANEL
Anion gap: 9 (ref 5–15)
BUN: 18 mg/dL (ref 8–23)
CO2: 27 mmol/L (ref 22–32)
Calcium: 9.1 mg/dL (ref 8.9–10.3)
Chloride: 101 mmol/L (ref 98–111)
Creatinine, Ser: 1 mg/dL (ref 0.44–1.00)
GFR, Estimated: >60 mL/min (ref >60)
Glucose, Bld: 84 mg/dL (ref 70–99)
Potassium: 4.1 mmol/L (ref 3.5–5.1)
Sodium: 137 mmol/L (ref 135–145)

## 2023-10-05 LAB — CBC
HCT: 36.4 % (ref 36.0–46.0)
Hemoglobin: 12.2 g/dL (ref 12.0–15.0)
MCH: 31.9 pg (ref 26.0–34.0)
MCHC: 33.5 g/dL (ref 30.0–36.0)
MCV: 95 fL (ref 80.0–100.0)
Platelets: 298 10*3/uL (ref 150–400)
RBC: 3.83 MIL/uL — ABNORMAL LOW (ref 3.87–5.11)
RDW: 12.9 % (ref 11.5–15.5)
WBC: 12 10*3/uL — ABNORMAL HIGH (ref 4.0–10.5)
nRBC: 0 % (ref 0.0–0.2)

## 2023-10-05 NOTE — ED Triage Notes (Addendum)
Pt states she got up from the sofa and fell.  Pt states she face planted the floor.  No loc.  No vomiting.  Pt has abrasion to upper lip and above the lip   pt reports nosebleed from both nares.  No bleeding now.  Pt reports a headache.  No dizziness.  Pt alert  speech clear.   Pt denies neck or back pain.  No chest pain   no sob.

## 2023-10-06 ENCOUNTER — Emergency Department
Admission: EM | Admit: 2023-10-06 | Discharge: 2023-10-06 | Disposition: A | Discharge status: Home / Self Care | Payer: Medicare PPO | Attending: Emergency Medicine | Admitting: Emergency Medicine

## 2023-10-06 DIAGNOSIS — S0181XA Laceration without foreign body of other part of head, initial encounter: Secondary | ICD-10-CM

## 2023-10-06 DIAGNOSIS — S0990XA Unspecified injury of head, initial encounter: Secondary | ICD-10-CM

## 2023-10-06 DIAGNOSIS — R040 Epistaxis: Secondary | ICD-10-CM

## 2023-10-06 NOTE — ED Provider Notes (Signed)
[facility] Provider Note    Event Date/Time   First MD Initiated Contact with Patient 10/06/23 0143     (approximate)   History   Facial Injury   HPI  Courtney Murphy is a 67 year old female presenting to the emergency department for evaluation after a fall.  When patient went to stand up from her couch she lost her balance and fell forward onto her face.  Denies preceding chest pain, shortness of breath, lightheadedness.  Did not pass out after the fall.  Did initially have a nosebleed, but this is now controlled.  Has a small cut over her lip that she was not sure if would require repair.  Denies injury to other areas.    Physical Exam   Triage Vital Signs: ED Triage Vitals  Encounter Vitals Group     BP 10/05/23 2256 127/69     Systolic BP Percentile --      Diastolic BP Percentile --      Pulse Rate 10/05/23 2256 95     Resp 10/05/23 2256 19     Temp 10/05/23 2256 98.3 F (36.8 C)     Temp Source 10/05/23 2256 Oral     SpO2 10/05/23 2256 99 %     Weight 10/05/23 2258 170 lb (77.1 kg)     Height 10/05/23 2258 5\' 4"  (1.626 m)     Head Circumference --      Peak Flow --      Pain Score 10/05/23 2258 3     Pain Loc --      Pain Education --      Exclude from Growth Chart --     Most recent vital signs: Vitals:   10/05/23 2256  BP: 127/69  Pulse: 95  Resp: 19  Temp: 98.3 F (36.8 C)  SpO2: 99%    Nursing notes and vital signs reviewed.  General: Adult female, sitting upright in bed, awake interactive Head: There is a 1 cm laceration vertically over the upper lip with small area of gaping that does not involve the vermilion border.  There is swelling of the lip greater on the right with some ecchymosis of the inner lip without visible laceration of the lip or tongue.  There is dried blood in the bilateral nares without septal hematoma.  No active bleeding. Chest: Symmetric chest rise, no tenderness to palpation.  Cardiac: Regular  rhythm and rate.  Respiratory: Lungs clear to auscultation Abdomen: Soft, nondistended. No tenderness to palpation.  Pelvis: Stable in AP and lateral compression. No tenderness to palpation. MSK: No deformity to bilateral upper and lower extremity. Full range of motion to bilateral upper lower extremity with no pain. Back: No midline tenderness. Neuro: Alert, oriented. GCS 15. 5 out of 5 strength in bilateral upper and lower extremities. Normal sensation to light touch in bilateral upper and lower extremity. Skin: No evidence of burns  ED Results / Procedures / Treatments   Labs (all labs ordered are listed, but only abnormal results are displayed) Labs Reviewed  CBC - Abnormal; Notable for the following components:      Result Value   WBC 12.0 (*)    RBC 3.83 (*)    All other components within normal limits  BASIC METABOLIC PANEL     EKG EKG independently reviewed interpreted by myself (ER attending) demonstrates:    RADIOLOGY Imaging independently reviewed and interpreted by myself demonstrates:  CT head without acute bleed CT C-spine without acute fracture,  radiology does note degenerative changes that patient is aware of CT max face without acute facial fracture  PROCEDURES:  Critical Care performed: No  ..Laceration Repair  Date/Time: 10/06/2023 2:41 AM  Performed by: Trinna Post, MD Authorized by: Trinna Post, MD   Consent:    Consent obtained:  Verbal   Risks, benefits, and alternatives were discussed: yes   Anesthesia:    Anesthesia method:  None Laceration details:    Location:  Face   Length (cm):  1 Treatment:    Irrigation solution:  Sterile saline Skin repair:    Repair method:  Sutures   Suture size:  4-0   Wound skin closure material used: monocryl.   Suture technique:  Simple interrupted   Number of sutures:  1 Repair type:    Repair type:  Simple Post-procedure details:    Dressing:  Open (no dressing)   Procedure completion:   Tolerated    MEDICATIONS ORDERED IN ED: Medications - No data to display   IMPRESSION / MDM / ASSESSMENT AND PLAN / ED COURSE  I reviewed the triage vital signs and the nursing notes.  Differential diagnosis includes, but is not limited to, intracranial bleed, skull fracture, spine fracture, no evidence of thoracoabdominal trauma  Patient's presentation is most consistent with acute presentation with potential threat to life or bodily function.  67 year old female presenting to the emergency department for evaluation after a fall.  Well-appearing at the time of my initial evaluation.  CT scans reassuring.  Small laceration just above her upper lip with small area of gaping.  Repaired as above.  Discussed local anesthetic, but with single suture patient did prefer to hold off which I did feel was reasonable.  Tetanus recently updated.  No other injuries noted.  Patient is comfortable with discharge home.  Strict return precautions provided.  Patient discharged in stable condition.     FINAL CLINICAL IMPRESSION(S) / ED DIAGNOSES   Final diagnoses:  Closed head injury, initial encounter  Facial laceration, initial encounter  Epistaxis     Rx / DC Orders   ED Discharge Orders     None        Note:  This document was prepared using Dragon voice recognition software and may include unintentional dictation errors.   Trinna Post, MD 10/06/23 [phone]

## 2023-10-06 NOTE — Discharge Instructions (Signed)
You were seen in the emergency department today for evaluation after a fall.  Fortunately your CT scans did not show any serious injuries.  You had a small cut over your face that we repaired with a single suture that should dissolve on its own.  Follow with your primary care doctor for further evaluation.  Return to the ER for new or worsening symptoms.

## 2023-11-01 ENCOUNTER — Ambulatory Visit: Payer: Medicare PPO

## 2023-11-01 ENCOUNTER — Ambulatory Visit: Payer: Medicare PPO | Admitting: Internal Medicine

## 2023-11-01 ENCOUNTER — Other Ambulatory Visit: Payer: Medicare PPO

## 2023-11-05 ENCOUNTER — Other Ambulatory Visit: Payer: Self-pay

## 2023-11-05 ENCOUNTER — Emergency Department: Payer: Medicare PPO

## 2023-11-05 ENCOUNTER — Encounter: Payer: Self-pay | Admitting: Emergency Medicine

## 2023-11-05 ENCOUNTER — Emergency Department
Admission: EM | Admit: 2023-11-05 | Discharge: 2023-11-05 | Disposition: A | Discharge status: Home / Self Care | Payer: Medicare PPO | Attending: Emergency Medicine | Admitting: Emergency Medicine

## 2023-11-05 DIAGNOSIS — M7989 Other specified soft tissue disorders: Secondary | ICD-10-CM | POA: Insufficient documentation

## 2023-11-05 DIAGNOSIS — J45909 Unspecified asthma, uncomplicated: Secondary | ICD-10-CM | POA: Diagnosis not present

## 2023-11-05 DIAGNOSIS — R600 Localized edema: Secondary | ICD-10-CM | POA: Diagnosis not present

## 2023-11-05 DIAGNOSIS — E119 Type 2 diabetes mellitus without complications: Secondary | ICD-10-CM | POA: Diagnosis not present

## 2023-11-05 LAB — BASIC METABOLIC PANEL
Anion gap: 10 (ref 5–15)
BUN: 19 mg/dL (ref 8–23)
CO2: 28 mmol/L (ref 22–32)
Calcium: 8.8 mg/dL — ABNORMAL LOW (ref 8.9–10.3)
Chloride: 101 mmol/L (ref 98–111)
Creatinine, Ser: 1.01 mg/dL — ABNORMAL HIGH (ref 0.44–1.00)
GFR, Estimated: >60 mL/min (ref >60)
Glucose, Bld: 173 mg/dL — ABNORMAL HIGH (ref 70–99)
Potassium: 3.3 mmol/L — ABNORMAL LOW (ref 3.5–5.1)
Sodium: 139 mmol/L (ref 135–145)

## 2023-11-05 LAB — CBC WITH DIFFERENTIAL/PLATELET
Abs Immature Granulocytes: 0.06 10*3/uL (ref 0.00–0.07)
Basophils Absolute: 0.1 10*3/uL (ref 0.0–0.1)
Basophils Relative: 1 %
Eosinophils Absolute: 0.2 10*3/uL (ref 0.0–0.5)
Eosinophils Relative: 2 %
HCT: 36.1 % (ref 36.0–46.0)
Hemoglobin: 12 g/dL (ref 12.0–15.0)
Immature Granulocytes: 1 %
Lymphocytes Relative: 23 %
Lymphs Abs: 2.3 10*3/uL (ref 0.7–4.0)
MCH: 32 pg (ref 26.0–34.0)
MCHC: 33.2 g/dL (ref 30.0–36.0)
MCV: 96.3 fL (ref 80.0–100.0)
Monocytes Absolute: 0.4 10*3/uL (ref 0.1–1.0)
Monocytes Relative: 4 %
Neutro Abs: 7 10*3/uL (ref 1.7–7.7)
Neutrophils Relative %: 69 %
Platelets: 277 10*3/uL (ref 150–400)
RBC: 3.75 MIL/uL — ABNORMAL LOW (ref 3.87–5.11)
RDW: 12.4 % (ref 11.5–15.5)
WBC: 10 10*3/uL (ref 4.0–10.5)
nRBC: 0 % (ref 0.0–0.2)

## 2023-11-05 LAB — BRAIN NATRIURETIC PEPTIDE: B Natriuretic Peptide: 15.7 pg/mL (ref 0.0–100.0)

## 2023-11-05 LAB — TROPONIN I (HIGH SENSITIVITY): Troponin I (High Sensitivity): 4 ng/L (ref <18)

## 2023-11-05 NOTE — Discharge Instructions (Signed)
Your ultrasound does not show a blood clot, and your blood work is reassuring.  You may wear compression stockings to help with your swelling.  Please follow-up with your outpatient provider.  Please return for any new, worsening, or change in symptoms or new concerns.  It was a pleasure caring for you today.

## 2023-11-05 NOTE — ED Provider Triage Note (Signed)
Emergency Medicine Provider Triage Evaluation Note  Courtney Murphy , a 67 y.o. female  was evaluated in triage.  Pt complains of left leg swelling and pain that began yesterday. Patient reports history of blood clots, not on blood thinners. 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 No CP/SOB.  Left ankle surgery in April/may. No injuries  Review of Systems  Positive: Left leg swelling  Negative: Cp/SOB  Physical Exam  There were no vitals taken for this visit. Gen:   Awake, no distress   Resp:  Normal effort  MSK:   Moves extremities without difficulty  Other:  Pitting edema bilaterally  Medical Decision Making  Medically screening exam initiated at 10:56 AM.  Appropriate orders placed.  ANAGABRIELA YOU was informed that the remainder of the evaluation will be completed by another provider, this initial triage assessment does not replace that evaluation, and the importance of remaining in the ED until their evaluation is complete.     Jackelyn Hoehn, PA-C 11/05/23 1100

## 2023-11-05 NOTE — ED Triage Notes (Signed)
Patient to ED via [facility] for left leg swelling since yesterday. Hx of blood clots. Does not take blood thinners.

## 2023-11-05 NOTE — ED Provider Notes (Signed)
[facility] Provider Note    Event Date/Time   First MD Initiated Contact with Patient 11/05/23 1131     (approximate)   History   Leg Swelling   HPI  Courtney Murphy is a 67 y.o. female with a past medical history of antiphospholipid syndrome with history of pulmonary embolism, not currently on anticoagulation who presents today for evaluation of bilateral leg swelling for the past couple of days.  Patient reports that it feels tight in her legs.  She reports that the left leg feels bigger than the right leg.  She is concerned that she might have developed a blood clot.  She also reports that she has had a cough and chest congestion for the last couple of days.  No chest pain.  No trouble breathing.  No orthopnea.  No dyspnea on exertion.  No fevers or chills.  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   Anemia 07/17/2020   Asthma 07/17/2020   Degeneration of lumbar intervertebral disc 07/17/2020   Degenerative joint disease of hand 07/17/2020   Dyslipidemia 07/17/2020   Hip pain 07/17/2020   History of lumbar laminectomy 07/17/2020   Hypertensive disorder 07/17/2020   Joint pain 07/17/2020   Depression 07/17/2020   Obesity 07/17/2020   Sprain of left wrist 07/10/2020   Spinal stenosis of lumbar region 01/01/2020   Constipation 08/11/2019   Benign essential hypertension 05/23/2019   Unstable angina (HCC) 01/14/2019   Closed fracture of distal end of radius 10/03/2018   Kidney stone 08/17/2017   Iron deficiency 08/12/2017   RLS (restless legs syndrome) 07/26/2017   Type 2 diabetes mellitus without complication, without long-term current use of insulin (HCC) 04/22/2017   Chest pain 09/30/2016   Acid reflux 10/11/2015   Adult hypothyroidism 10/11/2015   ADHD (attention deficit hyperactivity disorder) 10/11/2015   Radiculopathy, lumbar  region 08/13/2015   Tear of meniscus of knee 07/11/2015   Left shoulder pain 06/18/2015   Synovitis of knee 05/13/2015   Low back pain 04/30/2015   Acute bronchitis due to infection 11/15/2014   Bariatric surgery status 10/22/2014   History of colonic polyps 08/16/2014   Routine history and physical examination of adult 06/14/2014   CMC arthritis 05/18/2014   De Quervain's tenosynovitis, left 05/18/2014   Herpes genitalis 04/10/2014   Insomnia 04/10/2014   Lateral epicondylitis 02/26/2014   Acne 10/06/2013   HLD (hyperlipidemia) 08/18/2013   B12 deficiency 05/25/2013   Trochanteric bursitis, right hip 05/03/2013          Physical Exam   Triage Vital Signs: ED Triage Vitals  Encounter Vitals Group     BP 11/05/23 1058 121/76     Systolic BP Percentile --      Diastolic BP Percentile --      Pulse Rate 11/05/23 1058 88     Resp 11/05/23 1058 18     Temp 11/05/23 1058 97.8 F (36.6 C)     Temp Source 11/05/23 1058 Oral     SpO2 11/05/23 1058 100 %     Weight 11/05/23 1057 170 lb (77.1 kg)     Height 11/05/23 1057 5\' 4"  (1.626 m)     Head Circumference --      Peak Flow --      Pain Score 11/05/23 1057 5     Pain Loc --      Pain Education --  Exclude from Growth Chart --     Most recent vital signs: Vitals:   11/05/23 1058  BP: 121/76  Pulse: 88  Resp: 18  Temp: 97.8 F (36.6 C)  SpO2: 100%    Physical Exam Vitals and nursing note reviewed.  Constitutional:      General: Awake and alert. No acute distress.    Appearance: Normal appearance. The patient is normal weight.  HENT:     Head: Normocephalic and atraumatic.     Mouth: Mucous membranes are moist.  Eyes:     General: PERRL. Normal EOMs        Right eye: No discharge.        Left eye: No discharge.     Conjunctiva/sclera: Conjunctivae normal.  Cardiovascular:     Rate and Rhythm: Normal rate and regular rhythm.     Pulses: Normal pulses.  Pulmonary:     Effort: Pulmonary effort is  normal. No respiratory distress.     Breath sounds: Normal breath sounds.  Abdominal:     Abdomen is soft. There is no abdominal tenderness. No rebound or guarding. No distention. Musculoskeletal:        General: No swelling. Normal range of motion.     Cervical back: Normal range of motion and neck supple.  1-2+ pitting edema bilateral lower extremities to mid shin.  No erythema.  Normal pedal pulses.  Normal capillary refill.  Feet are warm and well-perfused bilaterally. Skin:    General: Skin is warm and dry.     Capillary Refill: Capillary refill takes less than 2 seconds.     Findings: No rash.  Neurological:     Mental Status: The patient is awake and alert.      ED Results / Procedures / Treatments   Labs (all labs ordered are listed, but only abnormal results are displayed) Labs Reviewed  BASIC METABOLIC PANEL - Abnormal; Notable for the following components:      Result Value   Potassium 3.3 (*)    Glucose, Bld 173 (*)    Creatinine, Ser 1.01 (*)    Calcium 8.8 (*)    All other components within normal limits  CBC WITH DIFFERENTIAL/PLATELET - Abnormal; Notable for the following components:   RBC 3.75 (*)    All other components within normal limits  BRAIN NATRIURETIC PEPTIDE  TROPONIN I (HIGH SENSITIVITY)  TROPONIN I (HIGH SENSITIVITY)     EKG     RADIOLOGY I independently reviewed and interpreted imaging and agree with radiologists findings.     PROCEDURES:  Critical Care performed:   Procedures   MEDICATIONS ORDERED IN ED: Medications - No data to display   IMPRESSION / MDM / ASSESSMENT AND PLAN / ED COURSE  I reviewed the triage vital signs and the nursing notes.   Differential diagnosis includes, but is not limited to, DVT, dependent edema, heart failure.  Patient is awake and alert, hemodynamically stable and afebrile with a normal oxygen saturation of 100% on room air.  DVT ultrasound obtained and triage is negative for DVT.  There is  no warmth, erythema, or constitutional symptoms or wounds to suggest cellulitis.  She has normal pulses bilaterally, equal bilaterally, with normal capillary refill, and feet are warm and well-perfused, do not suspect acute arterial occlusion.  Labs are reassuring, normal BNP, no pulmonary edema on x-ray.  No JVD.  I do not feel this is consistent with acute heart failure.  Patient is reassured by her results today.  Recommended  compression stockings if her symptoms persist.  Also recommended close outpatient follow-up and return precautions.  Patient understands and agrees with plan.  She was discharged in stable condition.   Patient's presentation is most consistent with acute complicated illness / injury requiring diagnostic workup.    FINAL CLINICAL IMPRESSION(S) / ED DIAGNOSES   Final diagnoses:  Leg swelling     Rx / DC Orders   ED Discharge Orders     None        Note:  This document was prepared using Dragon voice recognition software and may include unintentional dictation errors.   Jackelyn Hoehn, PA-C 11/05/23 1407    Corena Herter, MD 11/05/23 1451

## 2023-11-08 ENCOUNTER — Inpatient Hospital Stay: Payer: Medicare PPO

## 2023-11-08 ENCOUNTER — Encounter (INDEPENDENT_AMBULATORY_CARE_PROVIDER_SITE_OTHER): Payer: Self-pay | Admitting: Nurse Practitioner

## 2023-11-08 ENCOUNTER — Encounter: Payer: Self-pay | Admitting: Nurse Practitioner

## 2023-11-08 ENCOUNTER — Inpatient Hospital Stay (HOSPITAL_BASED_OUTPATIENT_CLINIC_OR_DEPARTMENT_OTHER): Payer: Medicare PPO | Admitting: Nurse Practitioner

## 2023-11-08 ENCOUNTER — Ambulatory Visit (INDEPENDENT_AMBULATORY_CARE_PROVIDER_SITE_OTHER): Payer: Medicare PPO | Admitting: Nurse Practitioner

## 2023-11-08 ENCOUNTER — Inpatient Hospital Stay: Payer: Medicare PPO | Attending: Internal Medicine

## 2023-11-08 VITALS — BP 124/80 | HR 95 | Resp 16 | Wt 175.2 lb

## 2023-11-08 VITALS — BP 114/66 | HR 85

## 2023-11-08 VITALS — BP 118/64 | HR 92 | Temp 97.3°F | Wt 171.0 lb

## 2023-11-08 DIAGNOSIS — K909 Intestinal malabsorption, unspecified: Secondary | ICD-10-CM

## 2023-11-08 DIAGNOSIS — Z86711 Personal history of pulmonary embolism: Secondary | ICD-10-CM | POA: Insufficient documentation

## 2023-11-08 DIAGNOSIS — R760 Raised antibody titer: Secondary | ICD-10-CM | POA: Diagnosis not present

## 2023-11-08 DIAGNOSIS — Z7985 Long-term (current) use of injectable non-insulin antidiabetic drugs: Secondary | ICD-10-CM | POA: Insufficient documentation

## 2023-11-08 DIAGNOSIS — D6861 Antiphospholipid syndrome: Secondary | ICD-10-CM | POA: Diagnosis not present

## 2023-11-08 DIAGNOSIS — I10 Essential (primary) hypertension: Secondary | ICD-10-CM | POA: Diagnosis not present

## 2023-11-08 DIAGNOSIS — I890 Lymphedema, not elsewhere classified: Secondary | ICD-10-CM | POA: Diagnosis not present

## 2023-11-08 DIAGNOSIS — E538 Deficiency of other specified B group vitamins: Secondary | ICD-10-CM | POA: Diagnosis not present

## 2023-11-08 DIAGNOSIS — Z9884 Bariatric surgery status: Secondary | ICD-10-CM | POA: Insufficient documentation

## 2023-11-08 DIAGNOSIS — E119 Type 2 diabetes mellitus without complications: Secondary | ICD-10-CM | POA: Insufficient documentation

## 2023-11-08 DIAGNOSIS — D509 Iron deficiency anemia, unspecified: Secondary | ICD-10-CM | POA: Insufficient documentation

## 2023-11-08 LAB — CBC WITH DIFFERENTIAL (CANCER CENTER ONLY)
Abs Immature Granulocytes: 0.05 10*3/uL (ref 0.00–0.07)
Basophils Absolute: 0.1 10*3/uL (ref 0.0–0.1)
Basophils Relative: 1 %
Eosinophils Absolute: 0.2 10*3/uL (ref 0.0–0.5)
Eosinophils Relative: 2 %
HCT: 37.2 % (ref 36.0–46.0)
Hemoglobin: 12.2 g/dL (ref 12.0–15.0)
Immature Granulocytes: 1 %
Lymphocytes Relative: 24 %
Lymphs Abs: 2.7 10*3/uL (ref 0.7–4.0)
MCH: 31.4 pg (ref 26.0–34.0)
MCHC: 32.8 g/dL (ref 30.0–36.0)
MCV: 95.6 fL (ref 80.0–100.0)
Monocytes Absolute: 0.5 10*3/uL (ref 0.1–1.0)
Monocytes Relative: 5 %
Neutro Abs: 7.5 10*3/uL (ref 1.7–7.7)
Neutrophils Relative %: 67 %
Platelet Count: 313 10*3/uL (ref 150–400)
RBC: 3.89 MIL/uL (ref 3.87–5.11)
RDW: 12.5 % (ref 11.5–15.5)
WBC Count: 11 10*3/uL — ABNORMAL HIGH (ref 4.0–10.5)
nRBC: 0 % (ref 0.0–0.2)

## 2023-11-08 LAB — BASIC METABOLIC PANEL
Anion gap: 12 (ref 5–15)
BUN: 17 mg/dL (ref 8–23)
CO2: 28 mmol/L (ref 22–32)
Calcium: 9.1 mg/dL (ref 8.9–10.3)
Chloride: 97 mmol/L — ABNORMAL LOW (ref 98–111)
Creatinine, Ser: 0.7 mg/dL (ref 0.44–1.00)
GFR, Estimated: >60 mL/min (ref >60)
Glucose, Bld: 174 mg/dL — ABNORMAL HIGH (ref 70–99)
Potassium: 3.6 mmol/L (ref 3.5–5.1)
Sodium: 137 mmol/L (ref 135–145)

## 2023-11-08 LAB — IRON AND TIBC
Iron: 64 ug/dL (ref 28–170)
Saturation Ratios: 17 % (ref 10.4–31.8)
TIBC: 384 ug/dL (ref 250–450)
UIBC: 320 ug/dL

## 2023-11-08 LAB — VITAMIN B12: Vitamin B-12: 597 pg/mL (ref 180–914)

## 2023-11-08 LAB — FERRITIN: Ferritin: 38 ng/mL (ref 11–307)

## 2023-11-08 MED ORDER — IRON SUCROSE 20 MG/ML IV SOLN
200.0000 mg | Freq: Once | INTRAVENOUS | Status: AC
  Administered 2023-11-08: 200 mg via INTRAVENOUS
  Filled 2023-11-08: qty 10

## 2023-11-08 NOTE — Progress Notes (Signed)
Subjective:    Patient ID: Courtney Murphy, female    DOB: [birth year], 67 y.o.   MRN: [id number] Chief Complaint  Patient presents with   Follow-up    3 month follow up    Courtney Murphy is a 67 year old female who returns today in regards to swelling of her left lower extremity.  The patient notes that the swelling began shortly after her ankle surgery that she had little over a month ago.  She has been wearing medical grade compression socks and has helped the swelling but the ankle area still continues to swell.  Additionally she has significant issues with cramping in the left leg in addition to some more noticeable varicosities that have become prominent following her surgery.  Unfortunately due to her ankle surgery her range of motion  is quite limited decreasing use of her calf.  Currently she denies any open wounds or ulcerations.  She denies classic claudication-like symptoms or rest pain.  Since her last visit she is also been having swelling in her right lower extremity despite the use of compression.  She also has a history of antiphospholipid syndrome.  Initially she was on Coumadin but has been placed on Xarelto recently and compliant with that therapy.  Given her recent swelling she visited the emergency room and had a negative DVT study.  Previous, noninvasive study in our office showed no evidence of DVT in the left lower extremity.  No evidence of deep venous insufficiency or superficial venous reflux.  She had an ABI of 1.31 on the right and 1.27 on the left.  She had strong triphasic tibial artery waveforms bilaterally with strong toe waveforms bilaterally.    Review of Systems  All other systems reviewed and are negative.      Objective:   Physical Exam Vitals reviewed.  HENT:     Head: Normocephalic.  Cardiovascular:     Rate and Rhythm: Normal rate.     Pulses:          Dorsalis pedis pulses are detected w/ Doppler on the right side and detected w/ Doppler on the  left side.       Posterior tibial pulses are detected w/ Doppler on the right side and detected w/ Doppler on the left side.  Pulmonary:     Effort: Pulmonary effort is normal.  Musculoskeletal:     Right lower leg: 1+ Edema present.     Left lower leg: 1+ Edema present.  Skin:    General: Skin is warm and dry.  Neurological:     Mental Status: She is alert and oriented to person, place, and time.  Psychiatric:        Mood and Affect: Mood normal.        Behavior: Behavior normal.        Thought Content: Thought content normal.        Judgment: Judgment normal.     BP 124/80 (BP Location: Left Arm)   Pulse 95   Resp 16   Wt 175 lb 3.2 oz (79.5 kg)   BMI 30.07 kg/m   Past Medical History:  Diagnosis Date   Acid reflux 10/11/2015   Anemia    RECEIVES IRON INFUSIONS   Arthritis    Asthma    WELL CONTROLLED   Depression    Diabetes mellitus without complication (HCC)    Fatty liver    Gout    History of kidney stones    History of methicillin resistant staphylococcus  aureus (MRSA) 2016   Hypertension    H/O BEEN OFF BP MEDS FOR 1 YEAR   Hypothyroidism    Kidney stone    Knee pain    Left   Pulmonary emboli (HCC) 2011   Restless leg syndrome    Shoulder pain     Social History   Socioeconomic History   Marital status: Widowed    Spouse name: Not on file   Number of children: Not on file   Years of education: Not on file   Highest education level: Not on file  Occupational History   Not on file  Tobacco Use   Smoking status: Former    Current packs/day: 0.00    Average packs/day: 0.5 packs/day for 4.0 years (2.0 ttl pk-yrs)    Types: Cigarettes    Start date: 04/14/2011    Quit date: 04/14/2015    Years since quitting: 8.5   Smokeless tobacco: Never  Vaping Use   Vaping status: Never Used  Substance and Sexual Activity   Alcohol use: Not Currently    Comment: RARE   Drug use: No   Sexual activity: Never  Other Topics Concern   Not on file  Social  History Narrative   Quit smoking > 30 years ago; rare alcohol. 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 Lives in [place]; with dog.    Social Determinants of Health   Financial Resource Strain: Low Risk  (08/24/2023)   Received from [facility]   Overall Financial Resource Strain (CARDIA)    Difficulty of Paying Living Expenses: Not hard at all  Food Insecurity: No Food Insecurity (08/24/2023)   Received from [facility]   Hunger Vital Sign    Worried About Running Out of Food in the Last Year: Never true    Ran Out of Food in the Last Year: Never true  Transportation Needs: No Transportation Needs (08/24/2023)   Received from [facility] - Transportation    Lack of Transportation (Medical): No    Lack of Transportation (Non-Medical): No  Physical Activity: Sufficiently Active (08/24/2023)   Received from [facility]   Exercise Vital Sign    Days of Exercise per Week: 5 days    Minutes of Exercise per Session: 30 min  Stress: No Stress Concern Present (08/24/2023)   Received from [facility] - Occupational Stress Questionnaire    Feeling of Stress : Not at all  Social Connections: Moderately Isolated (08/24/2023)   Received from [facility]   Social Connection and Isolation Panel [NHANES]    Frequency of Communication with Friends and Family: More than three times a week    Frequency of Social Gatherings with Friends and Family: More than three times a week    Attends Religious Services: Never    Database administrator or Organizations: Yes    Attends Engineer, structural: More than 4 times per year    Marital Status: Widowed  Intimate Partner Violence: Not At Risk (08/24/2023)   Received from [facility]   Humiliation, Afraid, Rape, and Kick questionnaire    Fear of Current or Ex-Partner: No    Emotionally Abused: No    Physically Abused: No    Sexually  Abused: No    Past Surgical History:  Procedure Laterality Date   ABDOMINAL HYSTERECTOMY     BACK SURGERY     BREAST  REDUCTION SURGERY     CATARACT EXTRACTION W/PHACO Left 07/28/2017   Procedure: CATARACT EXTRACTION PHACO AND INTRAOCULAR LENS PLACEMENT (IOC) diabetic Left;  Surgeon: Lockie Mola, MD;  Location: [facility];  Service: Ophthalmology;  Laterality: Left;  Diabetic   CESAREAN SECTION  1990   CHOLECYSTECTOMY     CYSTOSCOPY/URETEROSCOPY/HOLMIUM LASER/STENT PLACEMENT Left 09/08/2017   Procedure: CYSTOSCOPY/URETEROSCOPY/HOLMIUM LASER/STENT EXCHANGE;  Surgeon: Vanna Scotland, MD;  Location: [facility];  Service: Urology;  Laterality: Left;   CYSTOSCOPY/URETEROSCOPY/HOLMIUM LASER/STENT PLACEMENT Left 12/14/2018   Procedure: CYSTOSCOPY/URETEROSCOPY/HOLMIUM LASER/STENT PLACEMENT;  Surgeon: Vanna Scotland, MD;  Location: [facility];  Service: Urology;  Laterality: Left;   FOOT SURGERY     GASTRIC BYPASS     IR NEPHROSTOMY PLACEMENT LEFT  08/17/2017   KNEE ARTHROSCOPY Left    KNEE ARTHROSCOPY Left 07/19/2015   Procedure: ARTHROSCOPY KNEE;  Surgeon: Erin Sons, MD;  Location: [facility];  Service: Orthopedics;  Laterality: Left;   NEPHROLITHOTOMY Left 08/17/2017   Procedure: NEPHROLITHOTOMY PERCUTANEOUS WITH HOLMIUM LASER;  Surgeon: Vanna Scotland, MD;  Location: [facility];  Service: Urology;  Laterality: Left;   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/2021   THUMB ARTHROSCOPY     TONSILLECTOMY      Family History  Problem Relation Age of Onset   Cancer Mother        breast   Heart disease Father    Diabetes Father    Hypertension Father    Cancer Maternal Grandmother    Cancer Maternal Grandfather    Heart disease Paternal Grandmother    Heart disease Paternal Grandfather    Cancer Maternal Aunt    Cancer Maternal Uncle    Heart disease Paternal Aunt    Heart disease Paternal Uncle    Kidney cancer Neg Hx    Bladder Cancer Neg Hx     Allergies  Allergen  Reactions   Sulfasalazine Hives   Nsaids     Avoids because of gastric bypass   Sulfa Antibiotics Hives   Penicillins Hives and Rash    Has patient had a PCN reaction causing immediate rash, facial/tongue/throat swelling, SOB or lightheadedness with hypotension: No Has patient had a PCN reaction causing severe rash involving mucus membranes or skin necrosis: No Has patient had a PCN reaction that required hospitalization: No Has patient had a PCN reaction occurring within the last 10 years: No If all of the above answers are "NO", then may proceed with Cephalosporin use.        Latest Ref Rng & Units 11/05/2023   12:37 PM 10/05/2023   11:02 PM 07/05/2023    2:16 PM  CBC  WBC 4.0 - 10.5 K/uL 10.0  12.0  10.4   Hemoglobin 12.0 - 15.0 g/dL 40.9  81.1  91.4   Hematocrit 36.0 - 46.0 % 36.1  36.4  33.6   Platelets 150 - 400 K/uL 277  298  243       CMP     Component Value Date/Time   NA 139 11/05/2023 1237   NA 143 05/13/2022 1431   K 3.3 (L) 11/05/2023 1237   CL 101 11/05/2023 1237   CO2 28 11/05/2023 1237   GLUCOSE 173 (H) 11/05/2023 1237   BUN 19 11/05/2023 1237   BUN 17 05/13/2022 1431   CREATININE 1.01 (H) 11/05/2023 1237   CALCIUM 8.8 (L) 11/05/2023 1237   PROT 7.0 02/14/2023 0726   PROT 6.6 07/17/2020 1242   ALBUMIN 3.9 02/14/2023 0726   ALBUMIN 4.4 07/17/2020 1242  AST 28 02/14/2023 0726   ALT 24 02/14/2023 0726   ALKPHOS 135 (H) 02/14/2023 0726   BILITOT 0.6 02/14/2023 0726   BILITOT 0.3 07/17/2020 1242   EGFR 88 05/13/2022 1431   GFRNONAA >60 11/05/2023 1237     No results found.     Assessment & Plan:   1. Lymphedema Recommend:  No surgery or intervention at this point in time.   The Patient is CEAP [id number].  The patient has been wearing compression for more than 12 weeks with no or little benefit.  The patient has been exercising daily for more than 12 weeks. The patient has been elevating and taking OTC pain medications for more than 12 weeks.   None of these have have eliminated the pain related to the lymphedema or the discomfort regarding excessive swelling and venous congestion.    I have reviewed my discussion with the patient regarding lymphedema and why it  causes symptoms.  Patient will continue wearing graduated compression on a daily basis. The patient should put the compression on first thing in the morning and removing them in the evening. The patient should not sleep in the compression.   In addition, behavioral modification throughout the day will be continued.  This will include frequent elevation (such as in a recliner), use of over the counter pain medications as needed and exercise such as walking.  The systemic causes for chronic edema such as liver, kidney and cardiac etiologies do not appear to have significant changed over the past year.    The patient has chronic , severe lymphedema with hyperpigmentation of the skin and has done MLD, skin care, medication, diet, exercise, elevation and compression for 4 weeks with no improvement,  I am recommending a lymphedema pump.  The patient still has stage 3 lymphedema and therefore, I believe that a lymph pump is needed to improve the control of the patient's lymphedema and improve the quality of life.  Additionally, a lymph pump is warranted because it will reduce the risk of cellulitis and ulceration in the future.  Patient should follow-up in six months   2. Benign essential hypertension Continue antihypertensive medications as already ordered, these medications have been reviewed and there are no changes at this time.  3. Antiphospholipid antibody positive Patient will continue with use of Xarelto.   Current Outpatient Medications on File Prior to Visit  Medication Sig Dispense Refill   albuterol (VENTOLIN HFA) 108 (90 Base) MCG/ACT inhaler Inhale 2 puffs into the lungs every 6 (six) hours as needed.     atomoxetine (STRATTERA) 40 MG capsule      atorvastatin  (LIPITOR) 20 MG tablet Take 20 mg by mouth every morning.      Cholecalciferol 125 MCG (5000 UT) capsule Take 5,000 Units by mouth daily.     cyanocobalamin (,VITAMIN B-12,) 1000 MCG/ML injection Inject 1,000 mcg into the muscle once a week.     cyclobenzaprine (FLEXERIL) 10 MG tablet Take 10 mg by mouth every 8 (eight) hours.     doxepin (SINEQUAN) 10 MG capsule Take by mouth.     DULoxetine HCl 40 MG CPEP Take 1 capsule by mouth daily.     fluticasone (FLONASE) 50 MCG/ACT nasal spray Place 2 sprays into both nostrils daily as needed for rhinitis or allergies.     furosemide (LASIX) 20 MG tablet Take 20 mg by mouth daily.     levothyroxine (SYNTHROID, LEVOTHROID) 100 MCG tablet Take 100 mcg by mouth daily before breakfast.  lisinopril (ZESTRIL) 5 MG tablet Take 5 mg by mouth daily.     OZEMPIC, 1 MG/DOSE, 4 MG/3ML SOPN Inject 1 mg into the skin once a week.     pramipexole (MIRAPEX) 0.5 MG tablet Take 0.5-1 mg by mouth at bedtime.      pregabalin (LYRICA) 50 MG capsule Take 50 mg by mouth 2 (two) times daily.     metFORMIN (GLUCOPHAGE-XR) 750 MG 24 hr tablet Take by mouth.     No current facility-administered medications on file prior to visit.    There are no Patient Instructions on file for this visit. No follow-ups on file.   Georgiana Spinner, NP

## 2023-11-08 NOTE — Patient Instructions (Signed)
Iron Sucrose Injection What is this medication? IRON SUCROSE (EYE ern SOO krose) treats low levels of iron (iron deficiency anemia) in people with kidney disease. Iron is a mineral that plays an important role in making red blood cells, which carry oxygen from your lungs to the rest of your body. This medicine may be used for other purposes; ask your health care provider or pharmacist if you have questions. COMMON BRAND NAME(S): Venofer What should I tell my care team before I take this medication? They need to know if you have any of these conditions: Anemia not caused by low iron levels Heart disease High levels of iron in the blood Kidney disease Liver disease An unusual or allergic reaction to iron, other medications, foods, dyes, or preservatives Pregnant or trying to get pregnant Breastfeeding How should I use this medication? This medication is for infusion into a vein. It is given in a hospital or clinic setting. Talk to your care team about the use of this medication in children. While this medication may be prescribed for children as young as 2 years for selected conditions, precautions do apply. Overdosage: If you think you have taken too much of this medicine contact a poison control center or emergency room at once. NOTE: This medicine is only for you. Do not share this medicine with others. What if I miss a dose? Keep appointments for follow-up doses. It is important not to miss your dose. Call your care team if you are unable to keep an appointment. What may interact with this medication? Do not take this medication with any of the following: Deferoxamine Dimercaprol Other iron products This medication may also interact with the following: Chloramphenicol Deferasirox This list may not describe all possible interactions. Give your health care provider a list of all the medicines, herbs, non-prescription drugs, or dietary supplements you use. Also tell them if you smoke,  drink alcohol, or use illegal drugs. Some items may interact with your medicine. What should I watch for while using this medication? Visit your care team regularly. Tell your care team if your symptoms do not start to get better or if they get worse. You may need blood work done while you are taking this medication. You may need to follow a special diet. Talk to your care team. Foods that contain iron include: whole grains/cereals, dried fruits, beans, or peas, leafy green vegetables, and organ meats (liver, kidney). What side effects may I notice from receiving this medication? Side effects that you should report to your care team as soon as possible: Allergic reactions--skin rash, itching, hives, swelling of the face, lips, tongue, or throat Low blood pressure--dizziness, feeling faint or lightheaded, blurry vision Shortness of breath Side effects that usually do not require medical attention (report to your care team if they continue or are bothersome): Flushing Headache Joint pain Muscle pain Nausea Pain, redness, or irritation at injection site This list may not describe all possible side effects. Call your doctor for medical advice about side effects. You may report side effects to FDA at 1-800-FDA-1088. Where should I keep my medication? This medication is given in a hospital or clinic. It will not be stored at home. NOTE: This sheet is a summary. It may not cover all possible information. If you have questions about this medicine, talk to your doctor, pharmacist, or health care provider.  2024 Elsevier/Gold Standard (2023-04-23 00:00:00)

## 2023-11-08 NOTE — Progress Notes (Signed)
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  Courtney Murphy 67 y.o. 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  In interim, she went to ER for left leg swelling. Ultrasound was negative for blood clot. She has seen vascular and told she has early lymphedema. She is not on anticoagulation.  Denies blood in stools or black-colored stools. Denies vaginal bleeding.   Review of Systems  Constitutional:  Positive for malaise/fatigue. Negative for chills, diaphoresis, fever and weight loss.  HENT:  Negative for nosebleeds and sore throat.   Eyes:  Negative for double vision.  Respiratory:  Negative for cough, hemoptysis, sputum production, shortness of breath and wheezing.   Cardiovascular:  Positive for leg swelling. Negative for chest pain, palpitations and orthopnea.  Gastrointestinal:  Negative for abdominal pain, blood in stool, constipation, diarrhea, heartburn, melena, nausea and vomiting.  Genitourinary:  Negative for dysuria,  frequency and urgency.  Musculoskeletal:  Positive for back pain and joint pain. Negative for falls.  Skin: Negative.  Negative for itching and rash.  Neurological:  Negative for dizziness, tingling, focal weakness, weakness and headaches.  Endo/Heme/Allergies:  Does not bruise/bleed easily.  Psychiatric/Behavioral:  Negative for depression. The patient is not nervous/anxious and does not have insomnia.      MEDICAL HISTORY:  Past Medical History:  Diagnosis Date   Acid reflux 10/11/2015   Anemia    RECEIVES IRON INFUSIONS   Arthritis    Asthma    WELL CONTROLLED   Depression    Diabetes mellitus without complication (HCC)    Fatty liver    Gout    History of kidney stones    History of methicillin resistant staphylococcus aureus (MRSA) 2016   Hypertension    H/O BEEN OFF BP MEDS FOR 1 YEAR   Hypothyroidism    Kidney stone    Knee pain    Left   Pulmonary emboli (HCC) 2011   Restless leg syndrome    Shoulder pain     SURGICAL HISTORY: Past Surgical History:  Procedure Laterality Date   ABDOMINAL HYSTERECTOMY     BACK SURGERY     BREAST REDUCTION SURGERY     CATARACT EXTRACTION W/PHACO Left 07/28/2017   Procedure: CATARACT EXTRACTION PHACO AND INTRAOCULAR LENS PLACEMENT (IOC) diabetic Left;  Surgeon: Lockie Mola, MD;  Location: [facility];  Service: Ophthalmology;  Laterality: Left;  Diabetic   CESAREAN SECTION  1990   CHOLECYSTECTOMY     CYSTOSCOPY/URETEROSCOPY/HOLMIUM LASER/STENT PLACEMENT Left 09/08/2017   Procedure: CYSTOSCOPY/URETEROSCOPY/HOLMIUM LASER/STENT EXCHANGE;  Surgeon: Vanna Scotland, MD;  Location: [facility];  Service: Urology;  Laterality: Left;   CYSTOSCOPY/URETEROSCOPY/HOLMIUM LASER/STENT PLACEMENT Left 12/14/2018   Procedure: CYSTOSCOPY/URETEROSCOPY/HOLMIUM LASER/STENT PLACEMENT;  Surgeon: Vanna Scotland, MD;  Location: [facility];  Service: Urology;  Laterality: Left;   FOOT SURGERY     GASTRIC BYPASS     IR NEPHROSTOMY PLACEMENT  LEFT  08/17/2017   KNEE ARTHROSCOPY Left    KNEE ARTHROSCOPY Left 07/19/2015   Procedure: ARTHROSCOPY KNEE;  Surgeon: Erin Sons, MD;  Location: [facility];  Service: Orthopedics;  Laterality: Left;   NEPHROLITHOTOMY Left 08/17/2017   Procedure: NEPHROLITHOTOMY PERCUTANEOUS WITH HOLMIUM LASER;  Surgeon: Vanna Scotland, MD;  Location: [facility];  Service: Urology;  Laterality: Left;   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/2021   THUMB ARTHROSCOPY     TONSILLECTOMY      SOCIAL HISTORY: Social History   Socioeconomic History   Marital status: Widowed    Spouse name: Not on file   Number of children: Not on file   Years of education: Not on file   Highest education level: Not on file  Occupational History   Not on file  Tobacco Use   Smoking status: Former    Current packs/day: 0.00    Average packs/day: 0.5 packs/day for 4.0 years (2.0 ttl pk-yrs)    Types: Cigarettes    Start date: 04/14/2011    Quit date: 04/14/2015    Years since quitting: 8.5   Smokeless tobacco: Never  Vaping Use   Vaping status: Never Used  Substance and Sexual Activity   Alcohol use: Not Currently    Comment: RARE   Drug use: No   Sexual activity: Never  Other Topics Concern   Not on file  Social History Narrative   Quit smoking > 30 years ago; rare alcohol. 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 Lives in [place]; with dog.    Social Determinants of Health   Financial Resource Strain: Low Risk  (08/24/2023)   Received from [facility]   Overall Financial Resource Strain (CARDIA)    Difficulty of Paying Living Expenses: Not hard at all  Food Insecurity: No Food Insecurity (08/24/2023)   Received from [facility]   Hunger Vital Sign    Worried About Running Out of Food in the Last Year: Never true    Ran Out of Food in the Last Year: Never true  Transportation Needs: No Transportation Needs (08/24/2023)   Received from [facility] -  Transportation    Lack of Transportation (Medical): No    Lack of Transportation (Non-Medical): No  Physical Activity: Sufficiently Active (08/24/2023)   Received from [facility]   Exercise Vital Sign    Days of Exercise per Week: 5 days    Minutes of Exercise per Session: 30 min  Stress: No Stress Concern Present (08/24/2023)   Received from [facility] Occupational [facility] - Occupational Stress Questionnaire    Feeling of Stress : Not at all  Social Connections: Moderately Isolated (08/24/2023)   Received from [facility]   Social Connection and Isolation Panel [NHANES]    Frequency of Communication with Friends and Family: More than three times a week    Frequency of Social Gatherings with Friends and Family: More than three times a week    Attends Religious Services: Never    Database administrator or Organizations: Yes    Attends Engineer, structural: More than 4 times per year    Marital Status: Widowed  Intimate Partner Violence: Not At Risk (08/24/2023)   Received from  [facility]   Humiliation, Afraid, Rape, and Kick questionnaire    Fear of Current or Ex-Partner: No    Emotionally Abused: No    Physically Abused: No    Sexually Abused: No    FAMILY HISTORY: Family History  Problem Relation Age of Onset   Cancer Mother        breast   Heart disease Father    Diabetes Father    Hypertension Father    Cancer Maternal Grandmother    Cancer Maternal Grandfather    Heart disease Paternal Grandmother    Heart disease Paternal Grandfather    Cancer Maternal Aunt    Cancer Maternal Uncle    Heart disease Paternal Aunt    Heart disease Paternal Uncle    Kidney cancer Neg Hx    Bladder Cancer Neg Hx     ALLERGIES:  is allergic to sulfasalazine, nsaids, sulfa antibiotics, and penicillins.  MEDICATIONS:  Current Outpatient Medications  Medication Sig Dispense Refill   albuterol (VENTOLIN HFA) 108 (90 Base) MCG/ACT inhaler  Inhale 2 puffs into the lungs every 6 (six) hours as needed.     atomoxetine (STRATTERA) 40 MG capsule      atorvastatin (LIPITOR) 20 MG tablet Take 20 mg by mouth every morning.      Cholecalciferol 125 MCG (5000 UT) capsule Take 5,000 Units by mouth daily.     cyanocobalamin (,VITAMIN B-12,) 1000 MCG/ML injection Inject 1,000 mcg into the muscle once a week.     cyclobenzaprine (FLEXERIL) 10 MG tablet Take 10 mg by mouth every 8 (eight) hours.     doxepin (SINEQUAN) 10 MG capsule Take by mouth.     DULoxetine HCl 40 MG CPEP Take 1 capsule by mouth daily.     fluticasone (FLONASE) 50 MCG/ACT nasal spray Place 2 sprays into both nostrils daily as needed for rhinitis or allergies.     furosemide (LASIX) 20 MG tablet Take 20 mg by mouth daily.     levothyroxine (SYNTHROID, LEVOTHROID) 100 MCG tablet Take 100 mcg by mouth daily before breakfast.     lisinopril (ZESTRIL) 5 MG tablet Take 5 mg by mouth daily.     OZEMPIC, 1 MG/DOSE, 4 MG/3ML SOPN Inject 1 mg into the skin once a week.     pramipexole (MIRAPEX) 0.5 MG tablet Take 0.5-1 mg by mouth at bedtime.      pregabalin (LYRICA) 50 MG capsule Take 50 mg by mouth 2 (two) times daily.     metFORMIN (GLUCOPHAGE-XR) 750 MG 24 hr tablet Take by mouth.     No current facility-administered medications for this visit.    PHYSICAL EXAMINATION: Vitals:   11/08/23 1257  BP: 118/64  Pulse: 92  Temp: (!) 97.3 F (36.3 C)  SpO2: 100%   Filed Weights   11/08/23 1257  Weight: 171 lb (77.6 kg)   Physical Exam Constitutional:      Appearance: She is not ill-appearing.  Eyes:     General: No scleral icterus.    Conjunctiva/sclera: Conjunctivae normal.  Cardiovascular:     Rate and Rhythm: Normal rate and regular rhythm.  Abdominal:     General: There is no distension.     Palpations: Abdomen is soft.     Tenderness: There is no abdominal tenderness. There is no guarding.  Musculoskeletal:        General: No deformity.     Right lower leg:  No edema.     Left lower leg: Edema present.  Lymphadenopathy:     Cervical: No cervical adenopathy.  Skin:    General: Skin is warm and dry.  Neurological:     Mental Status: She is alert and oriented to person, place, and time. Mental status is at baseline.  Psychiatric:        Mood and Affect: Mood normal.        Behavior: Behavior normal.    LABORATORY DATA:  I have reviewed the data as listed Lab Results  Component Value Date   WBC 11.0 (H) 11/08/2023   HGB 12.2 11/08/2023   HCT 37.2 11/08/2023   MCV 95.6 11/08/2023   PLT 313 11/08/2023   Recent Labs    12/09/22 0425 02/14/23 0726 07/05/23 1416 10/05/23 2302 11/05/23 1237 11/08/23 1232  NA 139 134*   < > 137 139 137  K 3.8 4.2   < > 4.1 3.3* 3.6  CL 103 100   < > 101 101 97*  CO2 28 26   < > 27 28 28   GLUCOSE 124* 185*   < > 84 173* 174*  BUN 16 18   < > 18 19 17   CREATININE 0.76 0.93   < > 1.00 1.01* 0.70  CALCIUM 9.3 8.8*   < > 9.1 8.8* 9.1  GFRNONAA >60 >60   < > >60 >60 >60  PROT 6.9 7.0  --   --   --   --   ALBUMIN 3.7 3.9  --   --   --   --   AST 20 28  --   --   --   --   ALT 18 24  --   --   --   --   ALKPHOS 157* 135*  --   --   --   --   BILITOT 0.6 0.6  --   --   --   --    < > = values in this interval not displayed.   Iron/TIBC/Ferritin/ %Sat    Component Value Date/Time   IRON 50 07/05/2023 1416   TIBC 403 07/05/2023 1416   FERRITIN 41 07/05/2023 1416   IRONPCTSAT 12 07/05/2023 1416     RADIOGRAPHIC STUDIES: I have personally reviewed the radiological images as listed and agreed with the findings in the report. DG Chest 2 View  Result Date: 11/05/2023 CLINICAL DATA:  Cough and left leg swelling EXAM: CHEST - 2 VIEW COMPARISON:  Chest radiograph dated 02/14/2023 FINDINGS: Normal lung volumes. Left basilar linear opacities. No pleural effusion or pneumothorax. The heart size and mediastinal contours are within normal limits. No acute osseous abnormality. IMPRESSION: Left basilar linear  opacities, likely atelectasis. Electronically Signed   By: Agustin Cree M.D.   On: 11/05/2023 13:52   US Venous Img Lower Bilateral  Result Date: 11/05/2023 CLINICAL DATA:  67 year old female with lower extremity swelling. EXAM: BILATERAL LOWER EXTREMITY VENOUS DOPPLER ULTRASOUND TECHNIQUE: Gray-scale sonography with graded compression, as well as color Doppler and duplex ultrasound were performed to evaluate the lower extremity deep venous systems from the level of the common femoral vein and including the common femoral, femoral, profunda femoral, popliteal and calf veins including the posterior tibial, peroneal and gastrocnemius veins when visible. The superficial great saphenous vein was also interrogated. Spectral Doppler was utilized to evaluate flow at rest and with distal augmentation maneuvers in the common femoral, femoral and popliteal veins. COMPARISON:  04/23/2022 FINDINGS: RIGHT LOWER EXTREMITY Common Femoral Vein: No evidence of thrombus. Normal compressibility, respiratory phasicity and  response to augmentation. Saphenofemoral Junction: No evidence of thrombus. Normal compressibility and flow on color Doppler imaging. Profunda Femoral Vein: No evidence of thrombus. Normal compressibility and flow on color Doppler imaging. Femoral Vein: No evidence of thrombus. Normal compressibility, respiratory phasicity and response to augmentation. Popliteal Vein: No evidence of thrombus. Normal compressibility, respiratory phasicity and response to augmentation. Calf Veins: No evidence of thrombus. Normal compressibility and flow on color Doppler imaging. Other Findings:  None. LEFT LOWER EXTREMITY Common Femoral Vein: No evidence of thrombus. Normal compressibility, respiratory phasicity and response to augmentation. Saphenofemoral Junction: No evidence of thrombus. Normal compressibility and flow on color Doppler imaging. Profunda Femoral Vein: No evidence of thrombus. Normal compressibility and flow on color  Doppler imaging. Femoral Vein: No evidence of thrombus. Normal compressibility, respiratory phasicity and response to augmentation. Popliteal Vein: No evidence of thrombus. Normal compressibility, respiratory phasicity and response to augmentation. Calf Veins: No evidence of thrombus. Normal compressibility and flow on color Doppler imaging. Other Findings:  None. IMPRESSION: No evidence of bilateral lower extremity deep venous thrombosis. Marliss Coots, MD [facility] Radiology Electronically Signed   By: Marliss Coots M.D.   On: 11/05/2023 12:19     ASSESSMENT & PLAN:   Antiphospholipid antibody positive #Iron deficiency anemia [ gastric bypass-]-low iron saturation/ferritin.  S/p IV Venofer;    # Today hemoglobin is 12.2. Stable. Last received venofer August 2024. Given gastric bypass/malabsorption, she will likely need IV iron intermittently lifelong. Proceed with venofer today. Additional infusions based on labs which are pending.    # b12 def-d/t malabsorption. continue home B12 injections. Labs pending.    # Left leg pain/calf; and swelling around ankle [facility] 2024- s/p ankle fusion]- s/p [place] in clinic as per pt- NO blood clot. On xarelto 10 mg/day [per ortho] now discontinued. S/p consult with vascular: consistent with lymphedema.    # T2DM- - BG 174. Follow up with PCP.    # DISPOSITION: # Venofer today # follow up in 4 months- labs- cbc/bmp/iron studies/ferritin/b12 levels, Dr Donneta Romberg, +/- venofer- la  No problem-specific Assessment & Plan notes found for this encounter.  All questions were answered. The patient knows to call the clinic with any problems, questions or concerns.   Alinda Dooms, NP 11/08/2023

## 2023-12-03 ENCOUNTER — Telehealth (INDEPENDENT_AMBULATORY_CARE_PROVIDER_SITE_OTHER): Payer: Self-pay

## 2023-12-03 NOTE — Telephone Encounter (Signed)
 Patient called in asking about compression pump. The pump will be ordered from [employer] per the last note.

## 2024-01-20 ENCOUNTER — Telehealth (INDEPENDENT_AMBULATORY_CARE_PROVIDER_SITE_OTHER): Payer: Self-pay

## 2024-01-20 NOTE — Telephone Encounter (Signed)
 I have called and responded YES for peer to peer.  I left our office contact information.  If she calls back, please schedule peer to peer based on my available time on the schedule.  You can also use Monday afternoon as well, but it should be before 2 pm

## 2024-01-20 NOTE — Telephone Encounter (Signed)
 Amy from [facility] called stating that you Vivia [place]) applied for [employer] to get automatic compression and their medical director is requesting a peer to peer with you for the medical devices. She would like a YES or No response by 01/24/24 @ 12pm. If there isn't a response by then, [employer] will make a determination.   You can call or fax you response for peer to peer at: Phone: [phone] International aid/development worker voicemail) Fax: [phone]

## 2024-03-07 ENCOUNTER — Other Ambulatory Visit: Payer: Self-pay | Admitting: *Deleted

## 2024-03-07 ENCOUNTER — Telehealth: Payer: Self-pay | Admitting: Internal Medicine

## 2024-03-07 DIAGNOSIS — E611 Iron deficiency: Secondary | ICD-10-CM

## 2024-03-07 NOTE — Telephone Encounter (Signed)
 Pt left vm to r/s 4/9 appts.   I called pt and left a vm that her appts have been r/s to the next available appt for Dr.B, the date and time was given. I stated the appts will show up in mychart and if this new date/time did not work to call or send a [employer] and we can move them to a date/time that works.

## 2024-03-08 ENCOUNTER — Inpatient Hospital Stay: Payer: Medicare PPO

## 2024-03-08 ENCOUNTER — Inpatient Hospital Stay: Payer: Medicare PPO | Admitting: Internal Medicine

## 2024-03-09 ENCOUNTER — Emergency Department
Admission: EM | Admit: 2024-03-09 | Discharge: 2024-03-09 | Disposition: A | Discharge status: Home / Self Care | Attending: Emergency Medicine | Admitting: Emergency Medicine

## 2024-03-09 ENCOUNTER — Encounter: Payer: Self-pay | Admitting: *Deleted

## 2024-03-09 ENCOUNTER — Other Ambulatory Visit: Payer: Self-pay

## 2024-03-09 ENCOUNTER — Emergency Department

## 2024-03-09 DIAGNOSIS — M545 Low back pain, unspecified: Secondary | ICD-10-CM | POA: Insufficient documentation

## 2024-03-09 DIAGNOSIS — E119 Type 2 diabetes mellitus without complications: Secondary | ICD-10-CM | POA: Insufficient documentation

## 2024-03-09 DIAGNOSIS — M25552 Pain in left hip: Secondary | ICD-10-CM | POA: Diagnosis not present

## 2024-03-09 DIAGNOSIS — I10 Essential (primary) hypertension: Secondary | ICD-10-CM | POA: Diagnosis not present

## 2024-03-09 DIAGNOSIS — E039 Hypothyroidism, unspecified: Secondary | ICD-10-CM | POA: Insufficient documentation

## 2024-03-09 DIAGNOSIS — J45909 Unspecified asthma, uncomplicated: Secondary | ICD-10-CM | POA: Insufficient documentation

## 2024-03-09 DIAGNOSIS — G8929 Other chronic pain: Secondary | ICD-10-CM | POA: Insufficient documentation

## 2024-03-09 MED ORDER — DEXAMETHASONE SODIUM PHOSPHATE 10 MG/ML IJ SOLN
10.0000 mg | Freq: Once | INTRAMUSCULAR | Status: AC
  Administered 2024-03-09: 10 mg via INTRAMUSCULAR
  Filled 2024-03-09: qty 1

## 2024-03-09 MED ORDER — OXYCODONE HCL 5 MG PO TABS
5.0000 mg | ORAL_TABLET | Freq: Once | ORAL | Status: AC
  Administered 2024-03-09: 5 mg via ORAL
  Filled 2024-03-09: qty 1

## 2024-03-09 MED ORDER — OXYCODONE HCL 5 MG PO TABS: 5.0000 mg | ORAL_TABLET | Freq: Three times a day (TID) | ORAL | 0 refills | Status: AC | PRN

## 2024-03-09 NOTE — ED Triage Notes (Signed)
 Pt to triage via wheelchair.  Pt reports lower back for 2 days.  No known injury.  No urinary sx.  No n/v  pt states pain goes into left hip area.  Pt alert  speech clear.

## 2024-03-09 NOTE — Discharge Instructions (Signed)
 You have decided to leave the ED without your final results of your x-ray of the hip.  I would like for you to follow-up closely with your primary care provider for further evaluation and management.

## 2024-03-09 NOTE — ED Notes (Signed)
 Pt ambulating to bathroom - assisted by visitor.

## 2024-03-09 NOTE — ED Provider Notes (Signed)
 [facility] Emergency Department Provider Note     Event Date/Time   First MD Initiated Contact with Patient 03/09/24 1919     (approximate)   History   Back Pain   HPI  Courtney Murphy is a 68 y.o. female with a history of hypothyroidism, gout, DM, HTN and asthma presents to the ED for evaluation of low back pain with radiation to left hip x 2 days.  No injury or trauma.  Patient reports this is a persistent presentation as she is followed by pain management for her chronic back pain.  She denies loss of bowel or bladder control and saddle anesthesia.  Denies urinary symptoms.  Patient reports she normally takes hydrocodone for her pain but this has had minimal relief.  Denies leg weakness.  Patient is ambulatory. no other complaints.     Physical Exam   Triage Vital Signs: ED Triage Vitals  Encounter Vitals Group     BP 03/09/24 1857 116/86     Systolic BP Percentile --      Diastolic BP Percentile --      Pulse Rate 03/09/24 1857 (!) 110     Resp 03/09/24 1857 18     Temp 03/09/24 1857 97.8 F (36.6 C)     Temp Source 03/09/24 1857 Oral     SpO2 03/09/24 1857 100 %     Weight 03/09/24 1856 168 lb (76.2 kg)     Height 03/09/24 1856 5\' 4"  (1.626 m)     Head Circumference --      Peak Flow --      Pain Score 03/09/24 1856 10     Pain Loc --      Pain Education --      Exclude from Growth Chart --     Most recent vital signs: Vitals:   03/09/24 1857 03/09/24 2108  BP: 116/86   Pulse: (!) 110 81  Resp: 18 17  Temp: 97.8 F (36.6 C)   SpO2: 100% 100%    General: Lying on her right side in obvious discomfort.  Alert and oriented. INAD.  Skin:  Warm, dry and intact. No rashes or lesions noted.     Head:  NCAT.  Eyes:  PERRLA. EOMI.  CV:  Good peripheral perfusion. RRR.  RESP:  Normal effort. LCTAB.   BACK:  Spinous process is midline without deformity or tenderness.  Tenderness to lower left side lumbar region.  MSK:   Limited ROM  of left hip secondary to pain.  No swelling, deformity.  Moderate tenderness to palpation. NEURO: Cranial nerves intact. No focal deficits. Sensation and motor function intact.   ED Results / Procedures / Treatments   Labs (all labs ordered are listed, but only abnormal results are displayed) Labs Reviewed - No data to display RADIOLOGY  I personally viewed and evaluated these images as part of my medical decision making, as well as reviewing the written report by the radiologist.  ED Provider Interpretation: Normal hip x-ray  DG Hip Unilat With Pelvis 2-3 Views Left Result Date: 03/09/2024 CLINICAL DATA:  Hip pain EXAM: DG HIP (WITH OR WITHOUT PELVIS) 2-3V LEFT COMPARISON:  05/02/2022 FINDINGS: Hardware in the lower lumbar spine and left SI joint. Pubic symphysis and rami appear intact. No acute fracture or malalignment. IMPRESSION: No acute osseous abnormality. Electronically Signed   By: Jasmine Pang M.D.   On: 03/09/2024 23:12    PROCEDURES:  Critical Care performed: No  Procedures  MEDICATIONS ORDERED IN ED: Medications  oxyCODONE (Oxy IR/ROXICODONE) immediate release tablet 5 mg (5 mg Oral Given 03/09/24 2107)  dexamethasone (DECADRON) injection 10 mg (10 mg Intramuscular Given 03/09/24 2108)    IMPRESSION / MDM / ASSESSMENT AND PLAN / ED COURSE  I reviewed the triage vital signs and the nursing notes.                               68 y.o. female presents to the emergency department for evaluation and treatment of acute on chronic back and hip pain. See HPI for further details.   Differential diagnosis includes, but is not limited to chronic pain, fracture, sciatica  Patient's presentation is most consistent with acute complicated illness / injury requiring diagnostic workup.  Patient is alert and oriented.  She is hemodynamically stable.  Physical exam findings are reassuring.  X-ray is reassuring.  Encourage patient to follow-up with pain management for further  evaluation.  Patient is stable condition for discharge home.  FINAL CLINICAL IMPRESSION(S) / ED DIAGNOSES   Final diagnoses:  Chronic left-sided low back pain without sciatica  Chronic left hip pain   Rx / DC Orders   ED Discharge Orders          Ordered    oxyCODONE (ROXICODONE) 5 MG immediate release tablet  Every 8 hours PRN        03/09/24 2306           Note:  This document was prepared using Dragon voice recognition software and may include unintentional dictation errors.    Romeo Apple, Kia Stavros A, PA-C 03/09/24 2345    Claybon Jabs, MD 03/09/24 [id number]

## 2024-03-19 ENCOUNTER — Emergency Department

## 2024-03-19 ENCOUNTER — Emergency Department
Admission: EM | Admit: 2024-03-19 | Discharge: 2024-03-19 | Disposition: A | Discharge status: Home / Self Care | Attending: Emergency Medicine | Admitting: Emergency Medicine

## 2024-03-19 ENCOUNTER — Other Ambulatory Visit: Payer: Self-pay

## 2024-03-19 DIAGNOSIS — X501XXA Overexertion from prolonged static or awkward postures, initial encounter: Secondary | ICD-10-CM | POA: Diagnosis not present

## 2024-03-19 DIAGNOSIS — R2241 Localized swelling, mass and lump, right lower limb: Secondary | ICD-10-CM | POA: Diagnosis present

## 2024-03-19 DIAGNOSIS — I10 Essential (primary) hypertension: Secondary | ICD-10-CM | POA: Insufficient documentation

## 2024-03-19 DIAGNOSIS — S82841A Displaced bimalleolar fracture of right lower leg, initial encounter for closed fracture: Secondary | ICD-10-CM | POA: Insufficient documentation

## 2024-03-19 LAB — CBC WITH DIFFERENTIAL/PLATELET
Abs Immature Granulocytes: 0.05 10*3/uL (ref 0.00–0.07)
Basophils Absolute: 0.1 10*3/uL (ref 0.0–0.1)
Basophils Relative: 1 %
Eosinophils Absolute: 0.2 10*3/uL (ref 0.0–0.5)
Eosinophils Relative: 2 %
HCT: 32.4 % — ABNORMAL LOW (ref 36.0–46.0)
Hemoglobin: 10.9 g/dL — ABNORMAL LOW (ref 12.0–15.0)
Immature Granulocytes: 1 %
Lymphocytes Relative: 24 %
Lymphs Abs: 2.6 10*3/uL (ref 0.7–4.0)
MCH: 31.4 pg (ref 26.0–34.0)
MCHC: 33.6 g/dL (ref 30.0–36.0)
MCV: 93.4 fL (ref 80.0–100.0)
Monocytes Absolute: 0.6 10*3/uL (ref 0.1–1.0)
Monocytes Relative: 6 %
Neutro Abs: 7.3 10*3/uL (ref 1.7–7.7)
Neutrophils Relative %: 66 %
Platelets: 221 10*3/uL (ref 150–400)
RBC: 3.47 MIL/uL — ABNORMAL LOW (ref 3.87–5.11)
RDW: 12.9 % (ref 11.5–15.5)
WBC: 10.9 10*3/uL — ABNORMAL HIGH (ref 4.0–10.5)
nRBC: 0 % (ref 0.0–0.2)

## 2024-03-19 LAB — BASIC METABOLIC PANEL WITH GFR
Anion gap: 9 (ref 5–15)
BUN: 14 mg/dL (ref 8–23)
CO2: 30 mmol/L (ref 22–32)
Calcium: 9.1 mg/dL (ref 8.9–10.3)
Chloride: 99 mmol/L (ref 98–111)
Creatinine, Ser: 0.89 mg/dL (ref 0.44–1.00)
GFR, Estimated: >60 mL/min (ref >60)
Glucose, Bld: 118 mg/dL — ABNORMAL HIGH (ref 70–99)
Potassium: 3.9 mmol/L (ref 3.5–5.1)
Sodium: 138 mmol/L (ref 135–145)

## 2024-03-19 LAB — TYPE AND SCREEN
ABO/RH(D): O POS
Antibody Screen: NEGATIVE

## 2024-03-19 MED ORDER — MORPHINE SULFATE (PF) 2 MG/ML IV SOLN
2.0000 mg | Freq: Once | INTRAVENOUS | Status: AC
  Administered 2024-03-19: 2 mg via INTRAMUSCULAR

## 2024-03-19 MED ORDER — ACETAMINOPHEN 500 MG PO TABS
1000.0000 mg | ORAL_TABLET | Freq: Once | ORAL | Status: AC
  Administered 2024-03-19: 1000 mg via ORAL
  Filled 2024-03-19: qty 2

## 2024-03-19 MED ORDER — MORPHINE SULFATE (PF) 2 MG/ML IV SOLN
2.0000 mg | Freq: Once | INTRAVENOUS | Status: DC
  Filled 2024-03-19: qty 1

## 2024-03-19 MED ORDER — OXYCODONE-ACETAMINOPHEN 5-325 MG PO TABS
1.0000 | ORAL_TABLET | Freq: Once | ORAL | Status: AC
  Administered 2024-03-19: 1 via ORAL
  Filled 2024-03-19: qty 1

## 2024-03-19 MED ORDER — KETOROLAC TROMETHAMINE 15 MG/ML IJ SOLN
15.0000 mg | Freq: Once | INTRAMUSCULAR | Status: AC
  Administered 2024-03-19: 15 mg via INTRAMUSCULAR
  Filled 2024-03-19: qty 1

## 2024-03-19 NOTE — ED Notes (Signed)
 Posterior and stirrup ortho splint placed by MD Margery Sheets

## 2024-03-19 NOTE — Discharge Instructions (Addendum)
 Take acetaminophen  650 mg and ibuprofen 400 mg every 6 hours for pain.  Take with food. With your history of gastric bypass, take care when taking the ibuprofen make sure to take it with food and acid reducer.  If you start to have any sort of that stomach discomfort then discontinue your ibuprofen.  Take hydrocodone  for severe pain as prescribed previously  Call Dr. Michalene Agee if you do not hear from his podiatry clinic by midday on Monday.  Thank you for choosing us  for your health care today!  Please see your primary doctor this week for a follow up appointment.   If you have any new, worsening, or unexpected symptoms call your doctor right away or come back to the emergency department for reevaluation.  It was my pleasure to care for you today.   Arron Large Margery Sheets, MD

## 2024-03-19 NOTE — ED Triage Notes (Signed)
 Pt to ED with son via POV.  Pt reports  falling and injuring her R foot and R ankle.  Pt reports slipping while in kitchen.  Pedal pulse present, limited ROM due to swelling and pain.  Pt reports 8/10 pain primarily located on lateral aspect of foot and ankle.

## 2024-03-19 NOTE — ED Provider Notes (Addendum)
 [facility] Provider Note    Event Date/Time   First MD Initiated Contact with Patient 03/19/24 0406     (approximate)   History   Foot Injury   HPI  Courtney Murphy is a 68 y.o. female   Past medical history of knee replacement, hypertension, presents to the Emergency Department with ankle injury.  She misplaced her step and twisted her right ankle earlier today.  She had a soft fall no other injuries reported.  She has swelling to the right ankle.  She has no other acute medical complaints.  Independent Historian contributed to assessment above: Her son is at bedside to corroborate information past medical history above    Physical Exam   Triage Vital Signs: ED Triage Vitals [03/19/24 0226]  Encounter Vitals Group     BP 121/70     Systolic BP Percentile      Diastolic BP Percentile      Pulse Rate 79     Resp 17     Temp 97.9 F (36.6 C)     Temp Source Oral     SpO2 100 %     Weight 170 lb (77.1 kg)     Height 5\' 4"  (1.626 m)     Head Circumference      Peak Flow      Pain Score      Pain Loc      Pain Education      Exclude from Growth Chart     Most recent vital signs: Vitals:   03/19/24 0226  BP: 121/70  Pulse: 79  Resp: 17  Temp: 97.9 F (36.6 C)  SpO2: 100%    General: Awake, no distress.  CV:  Good peripheral perfusion.  Resp:  Normal effort.  Abd:  No distention.  Other:  Swelling and tenderness to the right ankle.  Neurovascular intact.  Head to toe examination reveals no other deformities or bony tenderness, no signs of head trauma, CT and L-spine no deformity or tenderness to palpation, thorax upper extremities left lower extremity, abdomen atraumatic.   ED Results / Procedures / Treatments   Labs (all labs ordered are listed, but only abnormal results are displayed) Labs Reviewed  BASIC METABOLIC PANEL WITH GFR - Abnormal; Notable for the following components:      Result Value   Glucose, Bld 118 (*)     All other components within normal limits  CBC WITH DIFFERENTIAL/PLATELET - Abnormal; Notable for the following components:   WBC 10.9 (*)    RBC 3.47 (*)    Hemoglobin 10.9 (*)    HCT 32.4 (*)    All other components within normal limits  TYPE AND SCREEN     Reviewed labs and noted no significant electrolyte derangement    RADIOLOGY I independently reviewed and interpreted x-ray of the ankle see bimalleolar fracture I also reviewed radiologist's formal read.   PROCEDURES:  Critical Care performed: No  .Splint Application  Date/Time: 03/19/2024 6:02 AM  Performed by: Buell Carmin, MD Authorized by: Buell Carmin, MD   Consent:    Consent obtained:  Verbal   Consent given by:  Patient   Risks, benefits, and alternatives were discussed: yes     Risks discussed:  Discoloration, numbness, pain and swelling   Alternatives discussed:  No treatment Universal protocol:    Procedure explained and questions answered to patient or proxy's satisfaction: yes     Patient identity confirmed:  Verbally with patient Pre-procedure  details:    Distal neurologic exam:  Normal   Distal perfusion: distal pulses strong and brisk capillary refill   Procedure details:    Location:  Ankle   Ankle location:  R ankle   Splint type:  Short leg and ankle stirrup   Supplies:  Elastic bandage, cotton padding and fiberglass   Attestation: Splint applied and adjusted personally by me   Post-procedure details:    Distal neurologic exam:  Normal   Distal perfusion: brisk capillary refill     Procedure completion:  Tolerated   Post-procedure imaging: not applicable      MEDICATIONS ORDERED IN ED: Medications  acetaminophen  (TYLENOL ) tablet 1,000 mg (1,000 mg Oral Given 03/19/24 0449)  morphine  (PF) 2 MG/ML injection 2 mg (2 mg Intramuscular Given 03/19/24 0450)  ketorolac  (TORADOL ) 15 MG/ML injection 15 mg (15 mg Intramuscular Given 03/19/24 0457)  oxyCODONE -acetaminophen  (PERCOCET/ROXICET) 5-325 MG  per tablet 1 tablet (1 tablet Oral Given 03/19/24 0545)    External physician / consultants:  I spoke with Dr. Michalene Agee podiatry regarding care plan for this patient.   IMPRESSION / MDM / ASSESSMENT AND PLAN / ED COURSE  I reviewed the triage vital signs and the nursing notes.                                Patient's presentation is most consistent with acute presentation with potential threat to life or bodily function.  Differential diagnosis includes, but is not limited to, fracture or dislocation of the ankle, foot fracture dislocation, neurovascular injury    MDM:    Unfortunately suffered a bimalleolar fracture reviewed with podiatry and amenable for outpatient surgery so long as patient can tolerate crutches, pain.  She already has a scooter from previous injury which her son brings to the emergency department today.  Medicate with pain medications, splint applied as above and tolerated, will trial ambulation with scooter and if able to tolerate ambulation/pain well-controlled pending will be for discharge home otherwise can admit.  She did well with her scooter and elects to go home at this time will follow-up with podiatrist and understands to return with any new or worsening symptoms.     FINAL CLINICAL IMPRESSION(S) / ED DIAGNOSES   Final diagnoses:  Closed bimalleolar fracture of right ankle, initial encounter     Rx / DC Orders   ED Discharge Orders     None        Note:  This document was prepared using Dragon voice recognition software and may include unintentional dictation errors.    Buell Carmin, MD 03/19/24 1610    Buell Carmin, MD 03/19/24 [phone]

## 2024-03-20 ENCOUNTER — Inpatient Hospital Stay

## 2024-03-20 ENCOUNTER — Inpatient Hospital Stay: Admitting: Internal Medicine

## 2024-03-20 ENCOUNTER — Telehealth: Payer: Self-pay

## 2024-03-20 NOTE — Telephone Encounter (Signed)
 error

## 2024-03-22 ENCOUNTER — Encounter: Payer: Self-pay | Admitting: Podiatry

## 2024-03-22 ENCOUNTER — Ambulatory Visit: Admitting: Podiatry

## 2024-03-22 VITALS — Ht 64.0 in | Wt 170.0 lb

## 2024-03-22 DIAGNOSIS — S82841A Displaced bimalleolar fracture of right lower leg, initial encounter for closed fracture: Secondary | ICD-10-CM | POA: Diagnosis not present

## 2024-03-22 NOTE — Progress Notes (Signed)
  Subjective:  Patient ID: Courtney Murphy, female    DOB: 12-02-1955,  MRN: [id number]  Chief Complaint  Patient presents with   Fracture    Pt is here due to right ankle fracture, pt was seen in ED due to fall on 4/20 and was told she had a bimalleolar fracture to the ankle.    68 y.o. female presents with the above complaint. History confirmed with patient.  She presents today as an urgent ER referral, fell at home on Saturday night before Easter rolled her ankle and injured it.  Stayed in the ER and found to have bimalleolar ankle fracture.  She had left ankle fusion last year with [facility] and already had a knee scooter and crutches and walker and has been able to stay off of it.  Objective:  Physical Exam: warm, good capillary refill, no trophic changes or ulcerative lesions, normal DP and PT pulses, and some peripheral neuropathy, her right ankle is painful edematous and has ecchymosis there is no fracture blistering there are return of skin lines noted today.  Radiographs: Multiple views x-ray of right ankle taken at [facility] ER on 03/19/2024 shows bimalleolar ankle fracture with Weber B fracture pattern slight comminution lateral and dorsal displacement of the distal malleolus, transverse medial malleolar fracture no dislocation or posterior malleolar fragment. Assessment:   1. Closed bimalleolar fracture of right ankle, initial encounter      Plan:  Patient was evaluated and treated and all questions answered.  We reviewed her x-rays from the emergency room and discussed that bimalleolar fractures do best with operative intervention especially considering the displacement of the fibula, reestablishment's length is important to have adequate function of the ankle mortise.  Her splint and sugar-tong was removed today and the skin was inspected and there are no fracture blisters or open lesions.  We discussed the risk and benefits of operative intervention as well as the recovery  process.  All questions addressed.  She has adequate support and DME to help her at home for recovery.  Informed consent signed and reviewed.  Outpatient surgery will be scheduled at the surgery center, we will transition to the hospital if needed for cost considerations.  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   Surgical plan:  Procedure: - Right ankle ORIF  Location: - [facility]  Anesthesia plan: - General With regional block  Postoperative pain plan: - Tylenol  1000 mg every 6 hours, oxycodone  5 mg 1-2 tabs every 6 hours only as needed, she will continue her pregabalin postop has a history of acid reflux and not a candidate for NSAIDs  DVT prophylaxis: - Xarelto or Eliquis postop  WB Restrictions / DME needs: - Nonweightbearing in splint postop   Return for after surgery.

## 2024-03-23 ENCOUNTER — Telehealth: Payer: Self-pay | Admitting: Podiatry

## 2024-03-23 NOTE — Telephone Encounter (Signed)
 DOS:   03/31/24  (RT) REPAIR OF ANKLE [phone]  (RT) OPEN TREATMENT OF DISTAL TIBIOFIBULAR JOINT [id number]  (RT) STRESS [id number]    EFFECTIVE DATE:   11/30/21    OOP:   $4,000.00  REMAINING :  $1,366.96  CO INSURANCE: 0%  PER THE [employer] PROVIDER PORTAL NO PRIOR AUTH IS REQ FOR CPT CODES [phone]

## 2024-03-30 ENCOUNTER — Other Ambulatory Visit: Payer: Self-pay | Admitting: Podiatry

## 2024-03-30 DIAGNOSIS — Z0271 Encounter for disability determination: Secondary | ICD-10-CM

## 2024-03-30 MED ORDER — RIVAROXABAN 10 MG PO TABS: 10.0000 mg | ORAL_TABLET | Freq: Every day | ORAL | 0 refills | Status: DC

## 2024-03-30 MED ORDER — HYDROCODONE-ACETAMINOPHEN 10-325 MG PO TABS
1.0000 | ORAL_TABLET | ORAL | 0 refills | Status: AC | PRN
Start: 2024-03-30 — End: 2024-04-04

## 2024-03-30 NOTE — Telephone Encounter (Signed)
 Recd STD forms from [facility](patient). Faxed to [phone] forms/notes. RTW approx 06/29/24 DOS 03/31/24

## 2024-03-30 NOTE — Progress Notes (Signed)
 03/31/2024 ankle ORIF

## 2024-03-31 DIAGNOSIS — S82841A Displaced bimalleolar fracture of right lower leg, initial encounter for closed fracture: Secondary | ICD-10-CM

## 2024-04-05 ENCOUNTER — Encounter: Payer: Self-pay | Admitting: Podiatry

## 2024-04-05 ENCOUNTER — Ambulatory Visit (INDEPENDENT_AMBULATORY_CARE_PROVIDER_SITE_OTHER): Admitting: Podiatry

## 2024-04-05 ENCOUNTER — Ambulatory Visit (INDEPENDENT_AMBULATORY_CARE_PROVIDER_SITE_OTHER)

## 2024-04-05 DIAGNOSIS — S82841A Displaced bimalleolar fracture of right lower leg, initial encounter for closed fracture: Secondary | ICD-10-CM

## 2024-04-05 DIAGNOSIS — S82841D Displaced bimalleolar fracture of right lower leg, subsequent encounter for closed fracture with routine healing: Secondary | ICD-10-CM

## 2024-04-05 NOTE — Patient Instructions (Addendum)
 Call to schedule PT: Phone: [phone] Start after your next visit

## 2024-04-06 NOTE — Progress Notes (Signed)
  Subjective:  Patient ID: Courtney Murphy, female    DOB: Nov 09, 1956,  MRN: [id number]  Chief Complaint  Patient presents with   Routine Post Op    POV # 1 DOS 03/31/24 REPAIR RT ANKLE FRACTURE "It's been doing great.  My pain level is a three out of 10."    68 y.o. female returns for post-op check.  Doing well pain has been minimal  Review of Systems: Negative except as noted in the HPI. Denies N/V/F/Ch.   Objective:  There were no vitals filed for this visit. There is no height or weight on file to calculate BMI. Constitutional Well developed. Well nourished.  Vascular Foot warm and well perfused. Capillary refill normal to all digits.  Calf is soft and supple, no posterior calf or knee pain, negative Homans' sign  Neurologic Normal speech. Oriented to person, place, and time. Epicritic sensation to light touch grossly present bilaterally.  Dermatologic Skin healing well without signs of infection. Skin edges well coapted without signs of infection.  Orthopedic: Tenderness to palpation noted about the surgical site.   Multiple view plain film radiographs: Status post ORIF right ankle fracture doing well Assessment:   1. Closed bimalleolar fracture of right ankle with routine healing, subsequent encounter    Plan:  Patient was evaluated and treated and all questions answered.  S/p foot surgery right -Progressing as expected post-operatively. -XR: Noted above no issues -WB Status: Nonweightbearing in CAM Walker boot.  This was dispensed today. -Sutures: Return in 2 weeks to remove sutures and staples. -Medications: No refills required -Foot redressed.  No follow-ups on file.

## 2024-04-06 NOTE — Telephone Encounter (Signed)
 Faxed [facility] revised RTW 04/17/24 and accommodations- she must work sitting(desk duty). She must use scooter/crutches and she needs to stay off of feet as much as possible(nonweightbearing). I faxed to [phone]

## 2024-04-14 NOTE — Telephone Encounter (Signed)
 pt lft mess. Called bk and advised her forms/notes were faxed on 04/06/24 with confirmation fax recd. I emailed her what was faxed. RTW 04/17/24 with restrictions.

## 2024-04-18 ENCOUNTER — Encounter: Payer: Self-pay | Admitting: Podiatry

## 2024-04-18 ENCOUNTER — Ambulatory Visit (INDEPENDENT_AMBULATORY_CARE_PROVIDER_SITE_OTHER): Admitting: Podiatry

## 2024-04-18 ENCOUNTER — Encounter (INDEPENDENT_AMBULATORY_CARE_PROVIDER_SITE_OTHER): Payer: Self-pay

## 2024-04-18 DIAGNOSIS — S82841D Displaced bimalleolar fracture of right lower leg, subsequent encounter for closed fracture with routine healing: Secondary | ICD-10-CM

## 2024-04-18 NOTE — Progress Notes (Signed)
 No chief complaint on file.   Subjective:  Patient presents today status post ORIF right ankle fracture.  Dr. Jeni Mitten.  DOS: 03/31/2024.  Patient doing well.  No new complaints  Past Medical History:  Diagnosis Date   Acid reflux 10/11/2015   Anemia    RECEIVES IRON  INFUSIONS   Arthritis    Asthma    WELL CONTROLLED   Depression    Diabetes mellitus without complication (HCC)    Fatty liver    Gout    History of kidney stones    History of methicillin resistant staphylococcus aureus (MRSA) 2016   Hypertension    H/O BEEN OFF BP MEDS FOR 1 YEAR   Hypothyroidism    Kidney stone    Knee pain    Left   Pulmonary emboli (HCC) 2011   Restless leg syndrome    Shoulder pain     Past Surgical History:  Procedure Laterality Date   ABDOMINAL HYSTERECTOMY     BACK SURGERY     BREAST REDUCTION SURGERY     CATARACT EXTRACTION W/PHACO Left 07/28/2017   Procedure: CATARACT EXTRACTION PHACO AND INTRAOCULAR LENS PLACEMENT (IOC) diabetic Left;  Surgeon: Annell Kidney, MD;  Location: [facility];  Service: Ophthalmology;  Laterality: Left;  Diabetic   CESAREAN SECTION  1990   CHOLECYSTECTOMY     CYSTOSCOPY/URETEROSCOPY/HOLMIUM LASER/STENT PLACEMENT Left 09/08/2017   Procedure: CYSTOSCOPY/URETEROSCOPY/HOLMIUM LASER/STENT EXCHANGE;  Surgeon: Dustin Gimenez, MD;  Location: [facility];  Service: Urology;  Laterality: Left;   CYSTOSCOPY/URETEROSCOPY/HOLMIUM LASER/STENT PLACEMENT Left 12/14/2018   Procedure: CYSTOSCOPY/URETEROSCOPY/HOLMIUM LASER/STENT PLACEMENT;  Surgeon: Dustin Gimenez, MD;  Location: [facility];  Service: Urology;  Laterality: Left;   FOOT SURGERY     GASTRIC BYPASS     IR NEPHROSTOMY PLACEMENT LEFT  08/17/2017   KNEE ARTHROSCOPY Left    KNEE ARTHROSCOPY Left 07/19/2015   Procedure: ARTHROSCOPY KNEE;  Surgeon: Josephus Nida, MD;  Location: [facility];  Service: Orthopedics;  Laterality: Left;   NEPHROLITHOTOMY Left 08/17/2017   Procedure:  NEPHROLITHOTOMY PERCUTANEOUS WITH HOLMIUM LASER;  Surgeon: Dustin Gimenez, MD;  Location: [facility];  Service: Urology;  Laterality: Left;   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/2021   THUMB ARTHROSCOPY     TONSILLECTOMY      Allergies  Allergen Reactions   Sulfasalazine Hives   Nsaids     Avoids because of gastric bypass   Sulfa Antibiotics Hives   Penicillins Hives and Rash    Has patient had a PCN reaction causing immediate rash, facial/tongue/throat swelling, SOB or lightheadedness with hypotension: No Has patient had a PCN reaction causing severe rash involving mucus membranes or skin necrosis: No Has patient had a PCN reaction that required hospitalization: No Has patient had a PCN reaction occurring within the last 10 years: No If all of the above answers are "NO", then may proceed with Cephalosporin use.     Objective/Physical Exam Neurovascular status intact.  Incision well coapted with staples and sutures intact. No sign of infectious process noted. No dehiscence. No active bleeding noted.  Moderate edema noted to the surgical extremity.   Assessment: 1. s/p ORIF RT ankle fracture. DOS: 03/31/2024.  Dr. Jeni Mitten   Plan of Care:  -Patient was evaluated.  - Staples and sutures removed today -Continue NWB CAM boot with the assistance of crutches -Recommend Ace wrap daily. -Turn to clinic 3 weeks with Dr. Michalene Agee for follow-up x-rays and evaluation   Dot Gazella, DPM [facility]  Dr. Dot Gazella, DPM    [address] [place], [place] [address]                Office [phone]  Fax [phone]

## 2024-04-28 ENCOUNTER — Inpatient Hospital Stay

## 2024-04-28 ENCOUNTER — Inpatient Hospital Stay: Admitting: Internal Medicine

## 2024-05-01 ENCOUNTER — Telehealth (INDEPENDENT_AMBULATORY_CARE_PROVIDER_SITE_OTHER): Payer: Self-pay

## 2024-05-01 NOTE — Telephone Encounter (Signed)
 Courtney Murphy left a voicemail stating that her legs and feet are swollen and she's been using the pumps, but they did help this morning.  I called her to get more information, but I didn't get a answer. I left a voicemail to call back.

## 2024-05-02 NOTE — Telephone Encounter (Signed)
 Made pt aware of recommendations and she states verbal understanding

## 2024-05-02 NOTE — Telephone Encounter (Signed)
 Pt states 05/01/2024 her legs and feet were swollen more then usual.  Right leg she is in a boot for a fall and 3 fractures non weight barring. She states she used the pumps and they did not help she also took her lasix and by lunchtime it had gone down some.  Pt has not been wearing compression and she is not elevating like she should. Pt states no pain, not hot or cold to touch, she states she has an appt on Monday at 9am is there something she needs to do between now and Monday.  Please advise

## 2024-05-02 NOTE — Telephone Encounter (Signed)
 Will unfortunately if the patient is not engaging in the conservative therapies that she needs to, her symptoms will be worse at times.  She should engage in conservative therapies such as wearing medical grade compression (on the leg not in a boot) and she should elevate her lower extremities.

## 2024-05-04 ENCOUNTER — Ambulatory Visit: Attending: Podiatry

## 2024-05-04 DIAGNOSIS — S82841D Displaced bimalleolar fracture of right lower leg, subsequent encounter for closed fracture with routine healing: Secondary | ICD-10-CM | POA: Insufficient documentation

## 2024-05-04 DIAGNOSIS — M25571 Pain in right ankle and joints of right foot: Secondary | ICD-10-CM | POA: Diagnosis present

## 2024-05-04 DIAGNOSIS — R262 Difficulty in walking, not elsewhere classified: Secondary | ICD-10-CM | POA: Diagnosis present

## 2024-05-04 NOTE — Therapy (Signed)
 OUTPATIENT PHYSICAL THERAPY  EVALUATION  Patient Name: Courtney Murphy MRN: [id number] DOB:[birth year], 68 y.o., female Today's Date: 05/04/2024  END OF SESSION:  PT End of Session - 05/04/24 1042     Visit Number 1    Number of Visits 24    Date for PT Re-Evaluation 07/27/24    Authorization Type [employer] PPO    Authorization Time Period 05/04/24-07/27/24    Progress Note Due on Visit 10    PT Start Time 1030    PT Stop Time 1100    PT Time Calculation (min) 30 min    Activity Tolerance Patient tolerated treatment well;No increased pain    Behavior During Therapy [facility] for tasks assessed/performed             Past Medical History:  Diagnosis Date   Acid reflux 10/11/2015   Anemia    RECEIVES IRON  INFUSIONS   Arthritis    Asthma    WELL CONTROLLED   Depression    Diabetes mellitus without complication (HCC)    Fatty liver    Gout    History of kidney stones    History of methicillin resistant staphylococcus aureus (MRSA) 2016   Hypertension    H/O BEEN OFF BP MEDS FOR 1 YEAR   Hypothyroidism    Kidney stone    Knee pain    Left   Pulmonary emboli (HCC) 2011   Restless leg syndrome    Shoulder pain    Past Surgical History:  Procedure Laterality Date   ABDOMINAL HYSTERECTOMY     BACK SURGERY     BREAST REDUCTION SURGERY     CATARACT EXTRACTION W/PHACO Left 07/28/2017   Procedure: CATARACT EXTRACTION PHACO AND INTRAOCULAR LENS PLACEMENT (IOC) diabetic Left;  Surgeon: Annell Kidney, MD;  Location: [facility];  Service: Ophthalmology;  Laterality: Left;  Diabetic   CESAREAN SECTION  1990   CHOLECYSTECTOMY     CYSTOSCOPY/URETEROSCOPY/HOLMIUM LASER/STENT PLACEMENT Left 09/08/2017   Procedure: CYSTOSCOPY/URETEROSCOPY/HOLMIUM LASER/STENT EXCHANGE;  Surgeon: Dustin Gimenez, MD;  Location: [facility];  Service: Urology;  Laterality: Left;   CYSTOSCOPY/URETEROSCOPY/HOLMIUM LASER/STENT PLACEMENT Left 12/14/2018   Procedure:  CYSTOSCOPY/URETEROSCOPY/HOLMIUM LASER/STENT PLACEMENT;  Surgeon: Dustin Gimenez, MD;  Location: [facility];  Service: Urology;  Laterality: Left;   FOOT SURGERY     GASTRIC BYPASS     IR NEPHROSTOMY PLACEMENT LEFT  08/17/2017   KNEE ARTHROSCOPY Left    KNEE ARTHROSCOPY Left 07/19/2015   Procedure: ARTHROSCOPY KNEE;  Surgeon: Josephus Nida, MD;  Location: [facility];  Service: Orthopedics;  Laterality: Left;   NEPHROLITHOTOMY Left 08/17/2017   Procedure: NEPHROLITHOTOMY PERCUTANEOUS WITH HOLMIUM LASER;  Surgeon: Dustin Gimenez, MD;  Location: [facility];  Service: Urology;  Laterality: Left;   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/2021   THUMB ARTHROSCOPY     TONSILLECTOMY     Patient Active Problem List   Diagnosis Date Noted   Malaise 02/14/2023   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/06/2021   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   Anemia 07/17/2020   Asthma 07/17/2020   Degeneration of lumbar intervertebral disc 07/17/2020   Degenerative joint disease of hand 07/17/2020   Dyslipidemia 07/17/2020   Hip pain 07/17/2020   History of lumbar laminectomy 07/17/2020   Hypertensive disorder 07/17/2020   Joint pain 07/17/2020   Depression 07/17/2020   Obesity 07/17/2020   Sprain of left wrist 07/10/2020   Spinal stenosis of lumbar region 01/01/2020   Constipation 08/11/2019   Benign essential hypertension 05/23/2019   Unstable angina (HCC) 01/14/2019  Closed fracture of distal end of radius 10/03/2018   Kidney stone 08/17/2017   Iron  deficiency 08/12/2017   RLS (restless legs syndrome) 07/26/2017   Type 2 diabetes mellitus without complication, without long-term current use of insulin  (HCC) 04/22/2017   Chest pain 09/30/2016   Acid reflux 10/11/2015   Adult hypothyroidism 10/11/2015   ADHD (attention deficit hyperactivity disorder) 10/11/2015   Radiculopathy, lumbar region 08/13/2015   Tear of meniscus of knee 07/11/2015   Left shoulder pain 06/18/2015   Synovitis of knee 05/13/2015    Low back pain 04/30/2015   Acute bronchitis due to infection 11/15/2014   Bariatric surgery status 10/22/2014   History of colonic polyps 08/16/2014   Routine history and physical examination of adult 06/14/2014   [facility] arthritis 05/18/2014   De Quervain's tenosynovitis, left 05/18/2014   Herpes genitalis 04/10/2014   Insomnia 04/10/2014   Lateral epicondylitis 02/26/2014   Acne 10/06/2013   HLD (hyperlipidemia) 08/18/2013   B12 deficiency 05/25/2013   Trochanteric bursitis, right hip 05/03/2013    PCP: Krasovich, Janelle, MD REFERRING PROVIDER: Jeni Mitten DPM REFERRING DIAG: Rt ankle fracture s/p ORIF  THERAPY DIAG:  Pain in right ankle and joints of right foot  Difficulty in walking, not elsewhere classified  Rationale for Evaluation and Treatment: Rehabilitation  ONSET DATE: 03/23/24  SUBJECTIVE:  SUBJECTIVE STATEMENT: Pt had a broken ankle fixed on 03/23/24. She sees the surgeon on 05/10/24.   PERTINENT HISTORY: 67yoF s/p kitchen injury, ankle fracture, ORIF 03/23/24, made NWB in CAM rocker, using AC or knee scooter for mobility needs, returned to work end of May. PMH: Left ankle fusion, lumbar fusion, TKA, THA. BLE lymphedema. Pt works Public librarian at [employer]. Pain control has been fairly good, however in the week preceding evaluation here, has spiked a bit.  PAIN:  Are you having pain? Yes 6/10, worst it's been in weeks, she suspects due to taking off boot for driving for the first time to get here.   PRECAUTIONS: CAM Rocker boot, imbalance with crutches, NWB status at eval WEIGHT BEARING RESTRICTIONS: Yes NWB at time of evaluation visit FALLS:  Has patient fallen in last 6 months? Yes   LIVING ENVIRONMENT: Stairs: 1 to get into a single level home Has following equipment at home: RW, AC, knee scooter   OCCUPATION: Recention desk at [employer] PLOF: independent  PATIENT GOALS: return to device free walking, regain strength in Rt ankle, maintain pain  control.   NEXT MD VISIT: May 10, 2024  OBJECTIVE:  Note: Objective measures were completed at evaluation unless otherwise noted.  PATIENT SURVEYS:  LEFS 05/04/24: 23.8% EDEMA:  Bilat ankle swelling, Left > Right; both with ankle surgical history, both with lymphedema and fluctuating edema.   LOWER EXTREMITY ROM:   ROM Right eval Left eval  Ankle dorsiflexion 15 degrees fused  Ankle plantarflexion 35 degrees fused  Ankle inversion    Ankle eversion     (Blank rows = not tested)  LOWER EXTREMITY MMT:   Grossly deferred until NWB has been advanced.   GAIT:   AMB into clinic with bilat AC, then again with [facility] and RW in session to try adjustments of equipment. Pt has mild unsteadiness with each of these. Often rests foot on floor when standing. Pt arrives without CAM rocker as it was doffed for driving.  TREATMENT DATE 05/04/24 :   -gait training with RW, AC adjusted for height  -education on HEP activities as below: Sagittal plane heel slides x20 (flat foot weightbearing)  Transverse plane heel slides x20 (flat foot weightbearing)  Open chain ankle circles x20    PATIENT EDUCATION:  Education details: see above Person educated: Patient Education method: Research scientist (medical), deliberate practice, positive reinforcement, explicit instruction, establish rules. Education comprehension: verbalized understanding, returned demonstration, verbal cues required, tactile cues required, and needs further education  HOME EXERCISE PROGRAM: Access Code: [email or web address] URL: [email or web address] Date: 05/04/2024 Prepared by: Atlee Blanks  Exercises - Seated Heel Slide with Internal Rotation  - 3-5 x daily - 1 sets - 20 reps - Long Arc Quad Right (with towel)   - 3-5 x daily - 1 sets - 20 reps - Seated Ankle Circles  - 2-3 x daily - 2 sets - 20  reps  ASSESSMENT:  CLINICAL IMPRESSION: 67yoF referred to OPPT s/p Rt ankle fracture and ORIF 5 weeks prior. Pt is successful navigating her home, community, and workplace with a variety of DME. Pt stil under orders for CAM rocker use, NWB status, may change next week at her 6 week followup. Pt was educated on further management of limb at home, use of DME, and gentle unloaded P/ROM of ankle. Interventions tolerated generally well given her NPRS on arrival. Pt has frank deficits in strength and joint mobility. A skilled PT intervention will help achieve return to PLOF in Rt ankle and return to all premorbid IADL and leisure.   OBJECTIVE IMPAIRMENTS: Abnormal gait, decreased activity tolerance, decreased balance, decreased knowledge of use of DME, difficulty walking, decreased ROM, decreased strength, and increased edema.  ACTIVITY LIMITATIONS: carrying, lifting, bending, standing, squatting, stairs, transfers, dressing, and locomotion level PARTICIPATION LIMITATIONS: meal prep, cleaning, laundry, personal finances, interpersonal relationship, driving, shopping, community activity, and occupation PERSONAL FACTORS: Age, Behavior pattern, Education, Past/current experiences, Social background, and Time since onset of injury/illness/exacerbation are also affecting patient's functional outcome.  REHAB POTENTIAL: Good CLINICAL DECISION MAKING: Stable/uncomplicated EVALUATION COMPLEXITY: Moderate  GOALS: Goals reviewed with patient? Yes  SHORT TERM GOALS: Target date: 06/03/24 Right Ankle P/ROM > 15 degrees DF, >40 degrees PF  Baseline: Goal status: INITIAL  2.  Tolerance of AMB >558ft with LRAD WBAT without increase in pain >1 on NPRS.  Baseline:  Goal status: INITIAL  LONG TERM GOALS: Target date: 08/04/24  5x STS <12sec, hands free from standard surface height.  Baseline: unable Goal status: INITIAL  2.  >1535ft with LRAD and <3 increase in NPRS Baseline: unable Goal status:  INITIAL  3.  Rt ankle DF MMT 5/5 and single leg heel raise >5x full range  Baseline: unable.  Goal status: INITIAL  4.  Rt SLS balance firm surface >20sec, tandem stance balance >35sec each way, normal stance on foam balance > 60sec.  Baseline: unable Goal status: INITIAL  5.  LEFS score improvement >20% to indicated reduced difficulty in performance of ADL/IADL mobility and improved quality of life.  Baseline:  25% at evlauation 05/04/24 Goal status: INITIAL  PLAN:  PT FREQUENCY: 1-2x/week  PT DURATION: 12 weeks  PLANNED INTERVENTIONS: 97750- Physical Performance Testing, 97110-Therapeutic exercises, 97530- Therapeutic activity, 97112- Neuromuscular re-education, 97535- Self Care, [id number]- Manual therapy, [phone]- Gait training, [phone]- Electrical stimulation (unattended), [phone]- Electrical stimulation (manual), Patient/Family education, Balance training, Stair training, Joint mobilization, Joint manipulation, Cryotherapy, and Moist heat  PLAN FOR NEXT SESSION: Return after 6/11 FU with podiatry, until then pt is  indepndent with HEP for ROM, remains NWB and with CAM rocker, update plan once new orders are received from MD regarding activity/weightbearing.   Abelino Tippin C, PT 05/04/2024, 11:15 AM   12:10 PM, 05/04/24 Dawn Eth, PT, DPT Physical Therapist - [place] [phone] (Office)

## 2024-05-08 ENCOUNTER — Encounter (INDEPENDENT_AMBULATORY_CARE_PROVIDER_SITE_OTHER): Payer: Self-pay | Admitting: Vascular Surgery

## 2024-05-08 ENCOUNTER — Encounter: Admitting: Physical Therapy

## 2024-05-08 ENCOUNTER — Ambulatory Visit (INDEPENDENT_AMBULATORY_CARE_PROVIDER_SITE_OTHER): Payer: Medicare PPO | Admitting: Vascular Surgery

## 2024-05-08 VITALS — BP 147/83 | HR 89 | Resp 18

## 2024-05-08 DIAGNOSIS — E119 Type 2 diabetes mellitus without complications: Secondary | ICD-10-CM

## 2024-05-08 DIAGNOSIS — I890 Lymphedema, not elsewhere classified: Secondary | ICD-10-CM

## 2024-05-08 DIAGNOSIS — I10 Essential (primary) hypertension: Secondary | ICD-10-CM

## 2024-05-08 NOTE — Progress Notes (Signed)
 Subjective:    Patient ID: Courtney Murphy, female    DOB: 1956-05-30, 68 y.o.   MRN: [id number] Chief Complaint  Patient presents with   Follow-up    Follow up 6 months swelling bilateral ankles(early onset lymphedema) /no studies     Courtney Murphy is a 68 year old female who returns to clinic for 55-month follow-up related to bilateral lower extremity edema with lymphedema.  In April of this year 2025 she fell and fractured her right ankle in 3 places and therefore her right lower extremity has more swollen than her left.  She endorses she has not been compliant with compression rest and elevation and exercise.  She endorses she is scheduled to go back to see the orthopedic surgeon tomorrow to start to be able to do some weightbearing which will help increase her exercise.  She also endorses that she does not like the compression socks she would rather have the pantyhose and is ordered some of those.  She is planning on restarting conservative therapy after she gets past treatment for her right lower ankle fracture.    Review of Systems  Constitutional: Negative.   Musculoskeletal:  Positive for gait problem.       Gait problem noted today to right lower extremity fracture currently in a boot nonweightbearing  Skin:  Positive for color change.       Color change to purple for right lower extremity ankle fracture  All other systems reviewed and are negative.      Objective:    Physical Exam Constitutional:      Appearance: Normal appearance. She is normal weight.  HENT:     Head: Normocephalic.  Eyes:     Pupils: Pupils are equal, round, and reactive to light.  Cardiovascular:     Rate and Rhythm: Normal rate and regular rhythm.     Pulses: Normal pulses.     Heart sounds: Normal heart sounds.  Pulmonary:     Effort: Pulmonary effort is normal.     Breath sounds: Normal breath sounds.  Abdominal:     General: Abdomen is flat. Bowel sounds are normal.     Palpations: Abdomen  is soft.  Musculoskeletal:        General: Swelling, tenderness and signs of injury present.     Cervical back: Normal range of motion.     Right lower leg: Edema present.     Left lower leg: Edema present.     Comments: Noted right ankle fracture in 3 places in April 2025.  Patient is now postsurgical  Skin:    General: Skin is warm and dry.     Capillary Refill: Capillary refill takes 2 to 3 seconds.  Neurological:     General: No focal deficit present.     Mental Status: She is alert and oriented to person, place, and time. Mental status is at baseline.  Psychiatric:        Mood and Affect: Mood normal.        Behavior: Behavior normal.        Thought Content: Thought content normal.        Judgment: Judgment normal.     BP (!) 147/83 (BP Location: Left Arm, Patient Position: Sitting, Cuff Size: Large)   Pulse 89   Resp 18   Past Medical History:  Diagnosis Date   Acid reflux 10/11/2015   Anemia    RECEIVES IRON  INFUSIONS   Arthritis    Asthma    WELL  CONTROLLED   Depression    Diabetes mellitus without complication (HCC)    Fatty liver    Gout    History of kidney stones    History of methicillin resistant staphylococcus aureus (MRSA) 2016   Hypertension    H/O BEEN OFF BP MEDS FOR 1 YEAR   Hypothyroidism    Kidney stone    Knee pain    Left   Pulmonary emboli (HCC) 2011   Restless leg syndrome    Shoulder pain     Social History   Socioeconomic History   Marital status: Widowed    Spouse name: Not on file   Number of children: Not on file   Years of education: Not on file   Highest education level: Not on file  Occupational History   Not on file  Tobacco Use   Smoking status: Former    Current packs/day: 0.00    Average packs/day: 0.5 packs/day for 4.0 years (2.0 ttl pk-yrs)    Types: Cigarettes    Start date: 04/14/2011    Quit date: 04/14/2015    Years since quitting: 9.0   Smokeless tobacco: Never  Vaping Use   Vaping status: Never Used   Substance and Sexual Activity   Alcohol use: Not Currently    Comment: RARE   Drug use: No   Sexual activity: Never  Other Topics Concern   Not on file  Social History Narrative   Quit smoking > 30 years ago; rare alcohol. 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 Lives in [place]; with dog.    Social Drivers of Corporate investment banker Strain: Low Risk  (08/24/2023)   Received from [facility]   Overall Financial Resource Strain (CARDIA)    Difficulty of Paying Living Expenses: Not hard at all  Food Insecurity: No Food Insecurity (08/24/2023)   Received from [facility]   Hunger Vital Sign    Worried About Running Out of Food in the Last Year: Never true    Ran Out of Food in the Last Year: Never true  Transportation Needs: No Transportation Needs (08/24/2023)   Received from [facility] - Transportation    Lack of Transportation (Medical): No    Lack of Transportation (Non-Medical): No  Physical Activity: Sufficiently Active (08/24/2023)   Received from [facility]   Exercise Vital Sign    Days of Exercise per Week: 5 days    Minutes of Exercise per Session: 30 min  Stress: No Stress Concern Present (08/24/2023)   Received from [facility] - Occupational Stress Questionnaire    Feeling of Stress : Not at all  Social Connections: Moderately Isolated (08/24/2023)   Received from [facility]   Social Connection and Isolation Panel [NHANES]    Frequency of Communication with Friends and Family: More than three times a week    Frequency of Social Gatherings with Friends and Family: More than three times a week    Attends Religious Services: Never    Database administrator or Organizations: Yes    Attends Engineer, structural: More than 4 times per year    Marital Status: Widowed  Intimate Partner Violence: Not At Risk (08/24/2023)   Received from [facility]   Humiliation, Afraid, Rape, and Kick questionnaire    Fear of Current or Ex-Partner: No    Emotionally Abused: No  Physically Abused: No    Sexually Abused: No    Past Surgical History:  Procedure Laterality Date   ABDOMINAL HYSTERECTOMY     BACK SURGERY     BREAST REDUCTION SURGERY     CATARACT EXTRACTION W/PHACO Left 07/28/2017   Procedure: CATARACT EXTRACTION PHACO AND INTRAOCULAR LENS PLACEMENT (IOC) diabetic Left;  Surgeon: Annell Kidney, MD;  Location: [facility];  Service: Ophthalmology;  Laterality: Left;  Diabetic   CESAREAN SECTION  1990   CHOLECYSTECTOMY     CYSTOSCOPY/URETEROSCOPY/HOLMIUM LASER/STENT PLACEMENT Left 09/08/2017   Procedure: CYSTOSCOPY/URETEROSCOPY/HOLMIUM LASER/STENT EXCHANGE;  Surgeon: Dustin Gimenez, MD;  Location: [facility];  Service: Urology;  Laterality: Left;   CYSTOSCOPY/URETEROSCOPY/HOLMIUM LASER/STENT PLACEMENT Left 12/14/2018   Procedure: CYSTOSCOPY/URETEROSCOPY/HOLMIUM LASER/STENT PLACEMENT;  Surgeon: Dustin Gimenez, MD;  Location: [facility];  Service: Urology;  Laterality: Left;   FOOT SURGERY     GASTRIC BYPASS     IR NEPHROSTOMY PLACEMENT LEFT  08/17/2017   KNEE ARTHROSCOPY Left    KNEE ARTHROSCOPY Left 07/19/2015   Procedure: ARTHROSCOPY KNEE;  Surgeon: Josephus Nida, MD;  Location: [facility];  Service: Orthopedics;  Laterality: Left;   NEPHROLITHOTOMY Left 08/17/2017   Procedure: NEPHROLITHOTOMY PERCUTANEOUS WITH HOLMIUM LASER;  Surgeon: Dustin Gimenez, MD;  Location: [facility];  Service: Urology;  Laterality: Left;   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/2021   THUMB ARTHROSCOPY     TONSILLECTOMY      Family History  Problem Relation Age of Onset   Cancer Mother        breast   Heart disease Father    Diabetes Father    Hypertension Father    Cancer Maternal Grandmother    Cancer Maternal Grandfather    Heart disease Paternal Grandmother    Heart disease Paternal Grandfather    Cancer Maternal Aunt     Cancer Maternal Uncle    Heart disease Paternal Aunt    Heart disease Paternal Uncle    Kidney cancer Neg Hx    Bladder Cancer Neg Hx     Allergies  Allergen Reactions   Sulfasalazine Hives   Nsaids     Avoids because of gastric bypass   Sulfa Antibiotics Hives   Penicillins Hives and Rash    Has patient had a PCN reaction causing immediate rash, facial/tongue/throat swelling, SOB or lightheadedness with hypotension: No Has patient had a PCN reaction causing severe rash involving mucus membranes or skin necrosis: No Has patient had a PCN reaction that required hospitalization: No Has patient had a PCN reaction occurring within the last 10 years: No If all of the above answers are "NO", then may proceed with Cephalosporin use.        Latest Ref Rng & Units 03/19/2024    6:01 AM 11/08/2023   12:32 PM 11/05/2023   12:37 PM  CBC  WBC 4.0 - 10.5 K/uL 10.9  11.0  10.0   Hemoglobin 12.0 - 15.0 g/dL 16.1  09.6  04.5   Hematocrit 36.0 - 46.0 % 32.4  37.2  36.1   Platelets 150 - 400 K/uL 221  313  277        CMP     Component Value Date/Time   NA 138 03/19/2024 0601   NA 143 05/13/2022 1431   K 3.9 03/19/2024 0601   CL 99 03/19/2024 0601   CO2 30 03/19/2024 0601   GLUCOSE 118 (H) 03/19/2024 0601   BUN 14 03/19/2024 0601   BUN 17 05/13/2022 1431   CREATININE 0.89 03/19/2024  0601   CALCIUM  9.1 03/19/2024 0601   PROT 7.0 02/14/2023 0726   PROT 6.6 07/17/2020 1242   ALBUMIN 3.9 02/14/2023 0726   ALBUMIN 4.4 07/17/2020 1242   AST 28 02/14/2023 0726   ALT 24 02/14/2023 0726   ALKPHOS 135 (H) 02/14/2023 0726   BILITOT 0.6 02/14/2023 0726   BILITOT 0.3 07/17/2020 1242   EGFR 88 05/13/2022 1431   GFRNONAA >60 03/19/2024 0601     No results found.     Assessment & Plan:   1. Lymphedema (Primary) The patient returns to the office for followup evaluation regarding leg swelling.  The swelling has persisted and the pain associated with swelling continues. There have not  been any interval development of a ulcerations or wounds.  Since the previous visit the patient has not been wearing graduated compression stockings consistently and has noted little if any improvement in the lymphedema.   Back in April 2025 patient broke her right ankle in 3 places requiring surgery.  She is in a boot today and is requested to be nonweightbearing but is using crutches and walking today to come to this visit.  Therefore the swelling to her right lower extremity is greater than left today but she endorses that they are both basically the same.  The patient also states elevation during the day is being done but due to the fracture exercise is severely reduced as she is nonweightbearing.   I suggested that she start over and become compliant on a daily basis with compression to her lower extremities.  She endorses that she is ordered pantyhose which are easier to wear and get on them with socks themselves.  She endorses she will start doing that as well as continuing elevation and continue work with orthopedics due to right ankle fracture.  We will follow-up in 6 months again with no studies to see how she is improving with her lymphedema as she continues conservative therapy.  2. Benign essential hypertension Continue antihypertensive medications as already ordered, these medications have been reviewed and there are no changes at this time.  3. Type 2 diabetes mellitus without complication, without long-term current use of insulin  (HCC) Continue hypoglycemic medications as already ordered, these medications have been reviewed and there are no changes at this time.  Hgb A1C to be monitored as already arranged by primary service   Current Outpatient Medications on File Prior to Visit  Medication Sig Dispense Refill   albuterol  (VENTOLIN  HFA) 108 (90 Base) MCG/ACT inhaler Inhale 2 puffs into the lungs every 6 (six) hours as needed.     atomoxetine  (STRATTERA ) 40 MG capsule       atorvastatin  (LIPITOR) 20 MG tablet Take 20 mg by mouth every morning.      cyanocobalamin  (,VITAMIN B-12,) 1000 MCG/ML injection Inject 1,000 mcg into the muscle once a week.     cyclobenzaprine  (FLEXERIL ) 10 MG tablet Take 10 mg by mouth every 8 (eight) hours.     doxepin (SINEQUAN) 10 MG capsule Take by mouth.     DULoxetine HCl 40 MG CPEP Take 1 capsule by mouth daily.     fluticasone (FLONASE) 50 MCG/ACT nasal spray Place 2 sprays into both nostrils daily as needed for rhinitis or allergies.     furosemide (LASIX) 20 MG tablet Take 20 mg by mouth daily.     levothyroxine  (SYNTHROID , LEVOTHROID) 100 MCG tablet Take 100 mcg by mouth daily before breakfast.     lisinopril  (ZESTRIL ) 5 MG tablet Take 5  mg by mouth daily.     pramipexole  (MIRAPEX ) 0.5 MG tablet Take 0.5-1 mg by mouth at bedtime.      pregabalin (LYRICA) 50 MG capsule Take 50 mg by mouth 2 (two) times daily.     metFORMIN (GLUCOPHAGE-XR) 750 MG 24 hr tablet Take by mouth.     rivaroxaban  (XARELTO ) 10 MG TABS tablet Take 1 tablet (10 mg total) by mouth daily. 30 tablet 0   No current facility-administered medications on file prior to visit.    There are no Patient Instructions on file for this visit. No follow-ups on file.   Annamaria Barrette, NP

## 2024-05-08 NOTE — Progress Notes (Deleted)
 Subjective:    Patient ID: Courtney Murphy, female    DOB: 1956-04-13, 68 y.o.   MRN: [id number] Chief Complaint  Patient presents with   Follow-up    Follow up 6 months swelling bilateral ankles(early onset lymphedema) /no studies     HPI  Review of Systems     Objective:    Physical Exam  BP (!) 147/83 (BP Location: Left Arm, Patient Position: Sitting, Cuff Size: Large)   Pulse 89   Resp 18   Past Medical History:  Diagnosis Date   Acid reflux 10/11/2015   Anemia    RECEIVES IRON  INFUSIONS   Arthritis    Asthma    WELL CONTROLLED   Depression    Diabetes mellitus without complication (HCC)    Fatty liver    Gout    History of kidney stones    History of methicillin resistant staphylococcus aureus (MRSA) 2016   Hypertension    H/O BEEN OFF BP MEDS FOR 1 YEAR   Hypothyroidism    Kidney stone    Knee pain    Left   Pulmonary emboli (HCC) 2011   Restless leg syndrome    Shoulder pain     Social History   Socioeconomic History   Marital status: Widowed    Spouse name: Not on file   Number of children: Not on file   Years of education: Not on file   Highest education level: Not on file  Occupational History   Not on file  Tobacco Use   Smoking status: Former    Current packs/day: 0.00    Average packs/day: 0.5 packs/day for 4.0 years (2.0 ttl pk-yrs)    Types: Cigarettes    Start date: 04/14/2011    Quit date: 04/14/2015    Years since quitting: 9.0   Smokeless tobacco: Never  Vaping Use   Vaping status: Never Used  Substance and Sexual Activity   Alcohol use: Not Currently    Comment: RARE   Drug use: No   Sexual activity: Never  Other Topics Concern   Not on file  Social History Narrative   Quit smoking > 30 years ago; rare alcohol. 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 Lives in [place]; with dog.    Social Drivers of Corporate investment banker Strain: Low Risk  (08/24/2023)   Received from [facility]   Overall Financial Resource Strain (CARDIA)    Difficulty of Paying Living Expenses: Not hard at all  Food Insecurity: No Food Insecurity (08/24/2023)   [facility]   Hunger Vital Sign    Worried About Running Out of Food in the Last Year: Never true    Ran Out of Food in the Last Year: Never true  Transportation Needs: No Transportation Needs (08/24/2023)   Received from [facility] - Transportation    Lack of Transportation (Medical): No    Lack of Transportation (Non-Medical): No  Physical Activity: Sufficiently Active (08/24/2023)   Received from [facility]   Exercise Vital Sign    Days of Exercise per Week: 5 days    Minutes of Exercise per Session: 30 min  Stress: No Stress Concern Present (08/24/2023)   Received from [facility] - Occupational Stress Questionnaire    Feeling of Stress : Not at all  Social Connections: Moderately Isolated (08/24/2023)   Received from [facility]   Social Connection and Isolation Panel [NHANES]    Frequency of Communication with Friends and Family: More than three times a week    Frequency of Social Gatherings with Friends and Family: More than three times a week    Attends Religious Services: Never    Database administrator or Organizations: Yes    Attends Engineer, structural: More than 4 times per year    Marital Status: Widowed  Intimate Partner Violence: Not At Risk (08/24/2023)   Received from [facility]   Humiliation, Afraid, Rape, and Kick questionnaire    Fear of Current or Ex-Partner: No    Emotionally Abused: No    Physically Abused: No    Sexually Abused: No    Past Surgical History:  Procedure Laterality Date   ABDOMINAL HYSTERECTOMY     BACK SURGERY     BREAST REDUCTION SURGERY     CATARACT EXTRACTION W/PHACO Left 07/28/2017   Procedure: CATARACT EXTRACTION PHACO AND INTRAOCULAR LENS PLACEMENT (IOC) diabetic  Left;  Surgeon: Annell Kidney, MD;  Location: [facility];  Service: Ophthalmology;  Laterality: Left;  Diabetic   CESAREAN SECTION  1990   CHOLECYSTECTOMY     CYSTOSCOPY/URETEROSCOPY/HOLMIUM LASER/STENT PLACEMENT Left 09/08/2017   Procedure: CYSTOSCOPY/URETEROSCOPY/HOLMIUM LASER/STENT EXCHANGE;  Surgeon: Dustin Gimenez, MD;  Location: [facility];  Service: Urology;  Laterality: Left;   CYSTOSCOPY/URETEROSCOPY/HOLMIUM LASER/STENT PLACEMENT Left 12/14/2018   Procedure: CYSTOSCOPY/URETEROSCOPY/HOLMIUM LASER/STENT PLACEMENT;  Surgeon: Dustin Gimenez, MD;  Location: [facility];  Service: Urology;  Laterality: Left;   FOOT SURGERY     GASTRIC BYPASS     IR NEPHROSTOMY PLACEMENT LEFT  08/17/2017   KNEE ARTHROSCOPY Left    KNEE ARTHROSCOPY Left 07/19/2015   Procedure: ARTHROSCOPY KNEE;  Surgeon: Josephus Nida, MD;  Location: [facility];  Service: Orthopedics;  Laterality: Left;   NEPHROLITHOTOMY Left 08/17/2017   Procedure: NEPHROLITHOTOMY PERCUTANEOUS WITH HOLMIUM LASER;  Surgeon: Dustin Gimenez, MD;  Location: [facility];  Service: Urology;  Laterality: Left;   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/2021   THUMB ARTHROSCOPY     TONSILLECTOMY      Family History  Problem Relation Age of Onset   Cancer Mother        breast   Heart disease Father    Diabetes Father    Hypertension Father    Cancer Maternal Grandmother    Cancer Maternal Grandfather    Heart disease Paternal Grandmother    Heart disease Paternal Grandfather    Cancer Maternal Aunt    Cancer Maternal Uncle    Heart disease Paternal Aunt    Heart disease Paternal Uncle    Kidney cancer Neg Hx    Bladder Cancer Neg Hx     Allergies  Allergen Reactions   Sulfasalazine Hives   Nsaids     Avoids because of gastric bypass   Sulfa Antibiotics Hives   Penicillins Hives and Rash    Has patient had a PCN reaction causing immediate rash, facial/tongue/throat swelling, SOB or lightheadedness with hypotension: No Has  patient had a PCN reaction causing severe rash involving mucus membranes or skin necrosis: No Has patient had a PCN reaction that required hospitalization: No Has patient had a PCN reaction occurring within the last 10 years: No If all of the above answers are "NO", then may proceed with Cephalosporin use.        Latest Ref Rng & Units 03/19/2024    6:01 AM 11/08/2023   12:32 PM 11/05/2023  12:37 PM  CBC  WBC 4.0 - 10.5 K/uL 10.9  11.0  10.0   Hemoglobin 12.0 - 15.0 g/dL 45.4  09.8  11.9   Hematocrit 36.0 - 46.0 % 32.4  37.2  36.1   Platelets 150 - 400 K/uL 221  313  277        CMP     Component Value Date/Time   NA 138 03/19/2024 0601   NA 143 05/13/2022 1431   K 3.9 03/19/2024 0601   CL 99 03/19/2024 0601   CO2 30 03/19/2024 0601   GLUCOSE 118 (H) 03/19/2024 0601   BUN 14 03/19/2024 0601   BUN 17 05/13/2022 1431   CREATININE 0.89 03/19/2024 0601   CALCIUM  9.1 03/19/2024 0601   PROT 7.0 02/14/2023 0726   PROT 6.6 07/17/2020 1242   ALBUMIN 3.9 02/14/2023 0726   ALBUMIN 4.4 07/17/2020 1242   AST 28 02/14/2023 0726   ALT 24 02/14/2023 0726   ALKPHOS 135 (H) 02/14/2023 0726   BILITOT 0.6 02/14/2023 0726   BILITOT 0.3 07/17/2020 1242   EGFR 88 05/13/2022 1431   GFRNONAA >60 03/19/2024 0601     No results found.     Assessment & Plan:   There are no diagnoses linked to this encounter.  Current Outpatient Medications on File Prior to Visit  Medication Sig Dispense Refill   albuterol  (VENTOLIN  HFA) 108 (90 Base) MCG/ACT inhaler Inhale 2 puffs into the lungs every 6 (six) hours as needed.     atomoxetine  (STRATTERA ) 40 MG capsule      atorvastatin  (LIPITOR) 20 MG tablet Take 20 mg by mouth every morning.      cyanocobalamin  (,VITAMIN B-12,) 1000 MCG/ML injection Inject 1,000 mcg into the muscle once a week.     cyclobenzaprine  (FLEXERIL ) 10 MG tablet Take 10 mg by mouth every 8 (eight) hours.     doxepin (SINEQUAN) 10 MG capsule Take by mouth.     DULoxetine HCl  40 MG CPEP Take 1 capsule by mouth daily.     fluticasone (FLONASE) 50 MCG/ACT nasal spray Place 2 sprays into both nostrils daily as needed for rhinitis or allergies.     furosemide (LASIX) 20 MG tablet Take 20 mg by mouth daily.     levothyroxine  (SYNTHROID , LEVOTHROID) 100 MCG tablet Take 100 mcg by mouth daily before breakfast.     lisinopril  (ZESTRIL ) 5 MG tablet Take 5 mg by mouth daily.     pramipexole  (MIRAPEX ) 0.5 MG tablet Take 0.5-1 mg by mouth at bedtime.      pregabalin (LYRICA) 50 MG capsule Take 50 mg by mouth 2 (two) times daily.     metFORMIN (GLUCOPHAGE-XR) 750 MG 24 hr tablet Take by mouth.     rivaroxaban  (XARELTO ) 10 MG TABS tablet Take 1 tablet (10 mg total) by mouth daily. 30 tablet 0   No current facility-administered medications on file prior to visit.    There are no Patient Instructions on file for this visit. No follow-ups on file.   Annamaria Barrette, NP

## 2024-05-10 ENCOUNTER — Ambulatory Visit (INDEPENDENT_AMBULATORY_CARE_PROVIDER_SITE_OTHER)

## 2024-05-10 ENCOUNTER — Ambulatory Visit (INDEPENDENT_AMBULATORY_CARE_PROVIDER_SITE_OTHER): Admitting: Podiatry

## 2024-05-10 ENCOUNTER — Encounter: Payer: Self-pay | Admitting: Podiatry

## 2024-05-10 ENCOUNTER — Encounter: Admitting: Physical Therapy

## 2024-05-10 DIAGNOSIS — S82841D Displaced bimalleolar fracture of right lower leg, subsequent encounter for closed fracture with routine healing: Secondary | ICD-10-CM | POA: Diagnosis not present

## 2024-05-14 ENCOUNTER — Emergency Department
Admission: EM | Admit: 2024-05-14 | Discharge: 2024-05-14 | Disposition: A | Discharge status: Home / Self Care | Attending: Emergency Medicine | Admitting: Emergency Medicine

## 2024-05-14 ENCOUNTER — Encounter: Payer: Self-pay | Admitting: Podiatry

## 2024-05-14 ENCOUNTER — Emergency Department

## 2024-05-14 ENCOUNTER — Other Ambulatory Visit: Payer: Self-pay

## 2024-05-14 DIAGNOSIS — R0789 Other chest pain: Secondary | ICD-10-CM | POA: Diagnosis not present

## 2024-05-14 DIAGNOSIS — M7989 Other specified soft tissue disorders: Secondary | ICD-10-CM

## 2024-05-14 DIAGNOSIS — M25571 Pain in right ankle and joints of right foot: Secondary | ICD-10-CM | POA: Diagnosis not present

## 2024-05-14 DIAGNOSIS — R2243 Localized swelling, mass and lump, lower limb, bilateral: Secondary | ICD-10-CM | POA: Diagnosis not present

## 2024-05-14 DIAGNOSIS — R079 Chest pain, unspecified: Secondary | ICD-10-CM | POA: Diagnosis present

## 2024-05-14 DIAGNOSIS — E119 Type 2 diabetes mellitus without complications: Secondary | ICD-10-CM | POA: Diagnosis not present

## 2024-05-14 DIAGNOSIS — Z7901 Long term (current) use of anticoagulants: Secondary | ICD-10-CM | POA: Insufficient documentation

## 2024-05-14 DIAGNOSIS — R600 Localized edema: Secondary | ICD-10-CM | POA: Diagnosis present

## 2024-05-14 LAB — TROPONIN I (HIGH SENSITIVITY)
Troponin I (High Sensitivity): 4 ng/L (ref <18)
Troponin I (High Sensitivity): 5 ng/L (ref <18)

## 2024-05-14 LAB — BASIC METABOLIC PANEL WITH GFR
Anion gap: 11 (ref 5–15)
BUN: 20 mg/dL (ref 8–23)
CO2: 26 mmol/L (ref 22–32)
Calcium: 9 mg/dL (ref 8.9–10.3)
Chloride: 103 mmol/L (ref 98–111)
Creatinine, Ser: 1.01 mg/dL — ABNORMAL HIGH (ref 0.44–1.00)
GFR, Estimated: >60 mL/min (ref >60)
Glucose, Bld: 121 mg/dL — ABNORMAL HIGH (ref 70–99)
Potassium: 3.9 mmol/L (ref 3.5–5.1)
Sodium: 140 mmol/L (ref 135–145)

## 2024-05-14 LAB — CBC
HCT: 33.1 % — ABNORMAL LOW (ref 36.0–46.0)
Hemoglobin: 11 g/dL — ABNORMAL LOW (ref 12.0–15.0)
MCH: 30.6 pg (ref 26.0–34.0)
MCHC: 33.2 g/dL (ref 30.0–36.0)
MCV: 92.2 fL (ref 80.0–100.0)
Platelets: 232 10*3/uL (ref 150–400)
RBC: 3.59 MIL/uL — ABNORMAL LOW (ref 3.87–5.11)
RDW: 12.5 % (ref 11.5–15.5)
WBC: 10.4 10*3/uL (ref 4.0–10.5)
nRBC: 0 % (ref 0.0–0.2)

## 2024-05-14 LAB — D-DIMER, QUANTITATIVE: D-Dimer, Quant: 1.49 ug{FEU}/mL — ABNORMAL HIGH (ref 0.00–0.50)

## 2024-05-14 LAB — BRAIN NATRIURETIC PEPTIDE: B Natriuretic Peptide: 22.1 pg/mL (ref 0.0–100.0)

## 2024-05-14 MED ORDER — FUROSEMIDE 10 MG/ML IJ SOLN
40.0000 mg | Freq: Once | INTRAMUSCULAR | Status: AC
  Administered 2024-05-14: 40 mg via INTRAVENOUS
  Filled 2024-05-14: qty 4

## 2024-05-14 MED ORDER — IOHEXOL 350 MG/ML SOLN
100.0000 mL | Freq: Once | INTRAVENOUS | Status: AC | PRN
  Administered 2024-05-14: 100 mL via INTRAVENOUS

## 2024-05-14 NOTE — Progress Notes (Signed)
  Subjective:  Patient ID: Courtney Murphy, female    DOB: January 15, 1956,  MRN: [id number]  Chief Complaint  Patient presents with   Routine Post Op    POV # 3 DOS 03/31/24 REPAIR RT ANKLE FRACTURE It's good.  My leg is deformed when it comes out of that boot.  It's swollen.    68 y.o. female returns for post-op check.  Doing well pain has been minimal  Review of Systems: Negative except as noted in the HPI. Denies N/V/F/Ch.   Objective:  There were no vitals filed for this visit. There is no height or weight on file to calculate BMI. Constitutional Well developed. Well nourished.  Vascular Foot warm and well perfused. Capillary refill normal to all digits.  Calf is soft and supple, no posterior calf or knee pain, negative Homans' sign  Neurologic Normal speech. Oriented to person, place, and time. Epicritic sensation to light touch grossly present bilaterally.  Dermatologic Incision well-healed not hypertrophic  Orthopedic: Mild edema.  No pain to palpation noted about the surgical site.   Multiple view plain film radiographs: Good early healing is noted with no complication of hardware Assessment:   1. Closed bimalleolar fracture of right ankle with routine healing, subsequent encounter    Plan:  Patient was evaluated and treated and all questions answered.  S/p foot surgery right - Doing well.  Continue nonweightbearing for 1.5 more weeks and can begin partial weightbearing in the boot on 05/22/2024 use assistive devices as needed.  Okay to return to work if she has transportation and she can elevate her limb.  Return in 6 weeks for new x-rays and transition out of CAM boot at that point to shoe gear  Return in about 6 weeks (around 06/21/2024) for post op (new x-rays).

## 2024-05-14 NOTE — ED Triage Notes (Signed)
 Pt reports right leg swelling and pain. Pt had surgery to her right ankle at the beginning of May, pt wears an orthopedic boot. Pt also reports chest pain and states that she is prone to blood clots. Pt talks in complete sentences no respiratory distress noted

## 2024-05-14 NOTE — Discharge Instructions (Signed)
 Take a double dose of your Lasix/furosemide over the next 3 days and then return to normal dosing.  You should get a phone call from our CHF clinic to schedule an appointment in the clinic to be seen and to get another echocardiogram  Return to the ED with any worsening symptoms  No signs of blood clot in the lungs with the right leg

## 2024-05-14 NOTE — ED Provider Notes (Signed)
 [facility] Provider Note    Event Date/Time   First MD Initiated Contact with Patient 05/14/24 2032     (approximate)   History   Leg Pain and Chest Pain   HPI  Courtney Murphy is a 68 y.o. female who presents to the ED for evaluation of Leg Pain and Chest Pain   I review a podiatric clinic visit from 5/7.  Bimalleolar ankle fracture on the right, underwent outpatient surgery on 5/2 after injury on 4/20. Otherwise history of obesity, PE, DM and is hypercoagulable due to antiphospholipid antibody syndrome on prophylactic Xarelto   Patient presents to the ED for evaluation of increased lower extremity swelling, R>L, orthopnea, chest discomfort and was concerned explicitly about a DVT or PE so presents to the ED.  Also reporting increased sensation of abdominal swelling.   Physical Exam   Triage Vital Signs: ED Triage Vitals  Encounter Vitals Group     BP 05/14/24 2015 (!) 151/130     Girls Systolic BP Percentile --      Girls Diastolic BP Percentile --      Boys Systolic BP Percentile --      Boys Diastolic BP Percentile --      Pulse Rate 05/14/24 2015 92     Resp 05/14/24 2015 16     Temp 05/14/24 2015 98.5 F (36.9 C)     Temp Source 05/14/24 2015 Oral     SpO2 05/14/24 2015 99 %     Weight 05/14/24 2013 175 lb (79.4 kg)     Height 05/14/24 2013 5' 4 (1.626 m)     Head Circumference --      Peak Flow --      Pain Score 05/14/24 2013 4     Pain Loc --      Pain Education --      Exclude from Growth Chart --     Most recent vital signs: Vitals:   05/14/24 2100 05/14/24 2230  BP: 117/65 133/72  Pulse: 76 76  Resp: 13 15  Temp:    SpO2: 92% 92%    General: Awake, no distress.  CV:  Good peripheral perfusion.  Resp:  Normal effort.  Abd:  No distention.  MSK:  No deformity noted.  Removed boot to demonstrate bilateral lower extremity swelling without overlying skin changes.  Seems symmetric to me Neuro:  No focal deficits  appreciated. Other:     ED Results / Procedures / Treatments   Labs (all labs ordered are listed, but only abnormal results are displayed) Labs Reviewed  BASIC METABOLIC PANEL WITH GFR - Abnormal; Notable for the following components:      Result Value   Glucose, Bld 121 (*)    Creatinine, Ser 1.01 (*)    All other components within normal limits  CBC - Abnormal; Notable for the following components:   RBC 3.59 (*)    Hemoglobin 11.0 (*)    HCT 33.1 (*)    All other components within normal limits  D-DIMER, QUANTITATIVE - Abnormal; Notable for the following components:   D-Dimer, Quant 1.49 (*)    All other components within normal limits  BRAIN NATRIURETIC PEPTIDE  TROPONIN I (HIGH SENSITIVITY)  TROPONIN I (HIGH SENSITIVITY)    EKG Sinus rhythm with a rate of 97 bpm.  Normal axis and intervals without clear signs of acute ischemia.  RADIOLOGY Venous ultrasound of the right leg interpreted by me without signs of DVT CTA chest interpreted  by me without PE, cardiomegaly is noted 1 view CXR interpreted by me with possible basilar infiltrate  Official radiology report(s): CT Angio Chest PE W and/or Wo Contrast Result Date: 05/14/2024 CLINICAL DATA:  Chest pain, right leg swelling EXAM: CT ANGIOGRAPHY CHEST WITH CONTRAST TECHNIQUE: Multidetector CT imaging of the chest was performed using the standard protocol during bolus administration of intravenous contrast. Multiplanar CT image reconstructions and MIPs were obtained to evaluate the vascular anatomy. RADIATION DOSE REDUCTION: This exam was performed according to the departmental dose-optimization program which includes automated exposure control, adjustment of the mA and/or kV according to patient size and/or use of iterative reconstruction technique. CONTRAST:  OMNIPAQUE  IOHEXOL  350 MG/ML SOLN COMPARISON:  02/14/2023, 05/14/2024 FINDINGS: Cardiovascular: This is a technically adequate evaluation of the pulmonary  vasculature. No filling defects or pulmonary emboli. Heart is enlarged without pericardial effusion. No evidence of thoracic aortic aneurysm or dissection. Atherosclerosis of the aorta and coronary vasculature. Mediastinum/Nodes: No enlarged mediastinal, hilar, or axillary lymph nodes. Thyroid  gland, trachea, and esophagus demonstrate no significant findings. Lungs/Pleura: Scattered bibasilar hypoventilatory changes. No acute airspace disease, effusion, or pneumothorax. Central airways are patent. Upper Abdomen: No acute abnormality. Musculoskeletal: No acute or destructive bony abnormalities. Reconstructed images demonstrate no additional findings. Review of the MIP images confirms the above findings. IMPRESSION: 1. No evidence of pulmonary embolus. 2. Cardiomegaly. 3. No acute intrathoracic process. 4. Aortic Atherosclerosis (ICD10-I70.0). Coronary artery atherosclerosis. Electronically Signed   By: Bobbye Burrow M.D.   On: 05/14/2024 21:55   US  Venous Img Lower Unilateral Right Result Date: 05/14/2024 EXAM: ULTRASOUND DUPLEX OF THE RIGHT LOWER EXTREMITY VEINS TECHNIQUE: Duplex ultrasound using B-mode/gray scaled imaging and Doppler spectral analysis and color flow was obtained of the deep venous structures of the right lower extremity. COMPARISON: None. CLINICAL HISTORY: Immobilization from surgery, swelling, pain. Eval DVT. FINDINGS: The visualized veins of the lower extremity are patent and free of echogenic thrombus. The veins demonstrate good compressibility with normal color flow study and spectral analysis. IMPRESSION: 1. No evidence of DVT. Electronically signed by: Zadie Herter MD 05/14/2024 09:10 PM EDT RP Workstation: [id number]   DG Chest Port 1 View Result Date: 05/14/2024 CLINICAL DATA:  [id number] Chest pain [id number] Pt reports right leg swelling and pain. Pt had surgery to her right ankle at the beginning of May, pt wears an orthopedic boot. Pt also reports chest pain and states that she is  prone to blood clots. EXAM: PORTABLE CHEST 1 VIEW COMPARISON:  None Available. FINDINGS: The heart and mediastinal contours are within normal limits. Left base atelectasis. Chronic coarsened interstitial marking with no overt pulmonary edema. No pleural effusion. No pneumothorax. No acute osseous abnormality. IMPRESSION: Left base atelectasis with developing superimposed infection/inflammation not excluded. Followup PA and lateral chest X-ray is recommended in 3-4 weeks following therapy to ensure resolution. Electronically Signed   By: Morgane  Naveau M.D.   On: 05/14/2024 21:07    PROCEDURES and INTERVENTIONS:  .1-3 Lead EKG Interpretation  Performed by: Arline Bennett, MD Authorized by: Arline Bennett, MD     Interpretation: normal     ECG rate:  70   ECG rate assessment: normal     Rhythm: sinus rhythm     Ectopy: none     Conduction: normal     Medications  iohexol  (OMNIPAQUE ) 350 MG/ML injection 100 mL (100 mLs Intravenous Contrast Given 05/14/24 2138)  furosemide (LASIX) injection 40 mg (40 mg Intravenous Given 05/14/24 2223)     IMPRESSION / MDM /  ASSESSMENT AND PLAN / ED COURSE  I reviewed the triage vital signs and the nursing notes.  Differential diagnosis includes, but is not limited to, ACS, PTX, PNA, muscle strain/spasm, PE, dissection, anxiety, pleural effusion, new onset CHF  {Patient presents with symptoms of an acute illness or injury that is potentially life-threatening.  Patient presents with a few days of orthopnea, lower extremity swelling, atypical chest discomfort and sensation of fairly diffuse swelling, concerning for new onset CHF.  She has a generally benign workup with regards to VTE.  While she has a positive D-dimer she has a negative venous ultrasound and CTA chest.  Add on a BNP.  First troponin is low and we will trend this for a second.  Renal function around baseline and normal electrolytes.  We discussed the possibility of admission for concerns for new  onset CHF to facilitate diuresis and echo but she declines preferring to be managed as an outpatient.  2 troponins are low and BNP is low as well.  Referred to CHF clinic for an echocardiogram considering her symptoms suspicious for new onset CHF, possibly diastolic dysfunction.  We discussed return precautions and she is suitable for outpatient management.      FINAL CLINICAL IMPRESSION(S) / ED DIAGNOSES   Final diagnoses:  Other chest pain  Leg swelling     Rx / DC Orders   ED Discharge Orders          Ordered    AMB referral to CHF clinic       Comments: Hx chronic lymphedema, but developing new orthopnea and more diffuse swelling. Likely benefit from repeat echo. Possible new onset CHF   05/14/24 2243             Note:  This document was prepared using Dragon voice recognition software and may include unintentional dictation errors.   Arline Bennett, MD 05/14/24 [phone]

## 2024-05-15 ENCOUNTER — Telehealth: Payer: Self-pay | Admitting: Physical Therapy

## 2024-05-15 ENCOUNTER — Ambulatory Visit: Admitting: Physical Therapy

## 2024-05-15 NOTE — Telephone Encounter (Signed)
 Called pt to inquire about absence from PT. Pt reports forgetting that she had an apt and that she will be at next session.

## 2024-05-18 ENCOUNTER — Ambulatory Visit: Admitting: Physical Therapy

## 2024-05-22 ENCOUNTER — Telehealth: Payer: Self-pay | Admitting: Family

## 2024-05-22 ENCOUNTER — Ambulatory Visit: Admitting: Physical Therapy

## 2024-05-22 NOTE — Telephone Encounter (Signed)
 Called to confirm/remind patient of [facility] appointment [facility] on [facility].   Appointment:   [x] Confirmed  [] Left mess   [] No answer/No voice mail  [] VM Full/unable to leave message  [] Phone not in service  Patient reminded to bring all medications and/or complete list.  Confirmed patient has transportation. Gave directions, instructed to utilize valet parking.

## 2024-05-22 NOTE — Progress Notes (Unsigned)
 Advanced Heart Failure Clinic Note   Referring Physician: EDP PCP: Krasovich, Janelle, MD Cardiologist: None   Chief Complaint: shortness of breath   HPI:  Courtney Murphy is a 68 y/o female with a history of obesity, PE, DM, HFpEF, lymphedema, right ankle fracture/ repair 05/25 and hypercoagulable due to antiphospholipid antibody syndrome on prophylactic Xarelto .    Was in the ED 05/14/24 due to leg pain and chest pain. Had increased lower extremity swelling, R>L, orthopnea, chest discomfort, abdominal swelling and was concerned explicitly about a DVT or PE. Positive D-dimer, negative venous ultrasound and CTA chest. Troponins and BNP low. Concern for possible HF so referral made to HF clinic.   She presents today for her initial HF visit with a chief complaint of shortness of breath, worse when lying down. Has associated fatigue, chest discomfort, occasional palpitations, pedal edema (worsening), abdominal distention. Denies dizziness. Using compression boots twice daily.   Not weighing daily. Not adding salt but lives alone so eats out frequently and admits that she doesn't always make the best choices. Unclear as to daily fluid intake. Denies tobacco, alcohol or drug use. 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   Review of Systems: [y] = yes, [ ]  = no   General: Weight gain [ ] ; Weight loss [ ] ; Anorexia [ ] ; Fatigue Davis.Dad ]; Fever [ ] ; Chills [ ] ; Weakness [ ]   Cardiac: Chest pain/pressure [ ] ; Resting SOB [ ] ; Exertional SOB Davis.Dad ]; Orthopnea [ ] ; Pedal Edema [ y]; Palpitations Davis.Dad ]; Syncope [ ] ; Presyncope [ ] ; Paroxysmal nocturnal dyspnea[y ]  Pulmonary: Cough [ ] ; Wheezing[ ] ; Hemoptysis[ ] ; Sputum [ ] ; Snoring [ ]   GI: Distention Davis.Dad ]; Dysphagia[ ] ; Melena[ ] ; Hematochezia [ ] ; Heartburn[ ] ; Abdominal pain [ ] ; Constipation [ ] ; Diarrhea [ ] ; BRBPR [ ]   GU: Hematuria[ ] ; Dysuria [ ] ; Nocturia[ ]   Vascular: Pain in legs with walking [ ] ;  Pain in feet with lying flat [ ] ; Non-healing sores [ ] ; Stroke [ ] ; TIA [ ] ; Slurred speech [ ] ;  Neuro: Headaches[ ] ; Vertigo[ ] ; Seizures[ ] ; Paresthesias[ ] ;Blurred vision [ ] ; Diplopia [ ] ; Vision changes [ ]   Ortho/Skin: Arthritis [ ] ; Joint pain [ ] ; Muscle pain [ ] ; Joint swelling [ ] ; Back Pain [ ] ; Rash [ ]   Psych: Depression[ ] ; Anxiety[ ]   Heme: Bleeding problems [ ] ; Clotting disorders [ ] ; Anemia [ ]   Endocrine: Diabetes Davis.Dad ]; Thyroid  dysfunction[ ]    Past Medical History:  Diagnosis Date   Acid reflux 10/11/2015   Anemia    RECEIVES IRON  INFUSIONS   Arthritis    Asthma    WELL CONTROLLED   Depression    Diabetes mellitus without complication (HCC)    Fatty liver    Gout    History of kidney stones    History of methicillin resistant staphylococcus aureus (MRSA) 2016   Hypertension    H/O BEEN OFF BP MEDS FOR 1 YEAR   Hypothyroidism    Kidney stone    Knee pain    Left   Pulmonary emboli (HCC) 2011   Restless leg syndrome    Shoulder pain     Current Outpatient Medications  Medication Sig Dispense Refill   albuterol  (VENTOLIN  HFA) 108 (90 Base) MCG/ACT inhaler Inhale 2 puffs into the lungs every 6 (six) hours as needed.     atomoxetine  (STRATTERA ) 40 MG capsule  atorvastatin  (LIPITOR) 20 MG tablet Take 20 mg by mouth every morning.      cyanocobalamin  (,VITAMIN B-12,) 1000 MCG/ML injection Inject 1,000 mcg into the muscle once a week.     cyclobenzaprine  (FLEXERIL ) 10 MG tablet Take 10 mg by mouth every 8 (eight) hours.     doxepin (SINEQUAN) 10 MG capsule Take by mouth.     DULoxetine HCl 40 MG CPEP Take 1 capsule by mouth daily.     fluticasone (FLONASE) 50 MCG/ACT nasal spray Place 2 sprays into both nostrils daily as needed for rhinitis or allergies.     furosemide  (LASIX ) 20 MG tablet Take 20 mg by mouth daily.     levothyroxine  (SYNTHROID , LEVOTHROID) 100 MCG tablet Take 100 mcg by mouth daily before breakfast.     lisinopril  (ZESTRIL ) 5 MG tablet  Take 5 mg by mouth daily.     metFORMIN (GLUCOPHAGE-XR) 750 MG 24 hr tablet Take by mouth.     pramipexole  (MIRAPEX ) 0.5 MG tablet Take 0.5-1 mg by mouth at bedtime.      pregabalin (LYRICA) 50 MG capsule Take 50 mg by mouth 2 (two) times daily.     rivaroxaban  (XARELTO ) 10 MG TABS tablet Take 1 tablet (10 mg total) by mouth daily. 30 tablet 0   No current facility-administered medications for this visit.    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       Social History   Socioeconomic History   Marital status: Widowed    Spouse name: Not on file   Number of children: Not on file   Years of education: Not on file   Highest education level: Not on file  Occupational History   Not on file  Tobacco Use   Smoking status: Former    Current packs/day: 0.00    Average packs/day: 0.5 packs/day for 4.0 years (2.0 ttl pk-yrs)    Types: Cigarettes    Start date: 04/14/2011    Quit date: 04/14/2015    Years since quitting: 9.1   Smokeless tobacco: Never  Vaping Use   Vaping status: Never Used  Substance and Sexual Activity   Alcohol use: Not Currently    Comment: RARE   Drug use: No   Sexual activity: Never  Other Topics Concern   Not on file  Social History Narrative   Quit smoking > 30 years ago; rare alcohol. 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 Lives in [place]; with dog.    Social  Drivers of Corporate investment banker Strain: Low Risk  (08/24/2023)   Received from [facility]   Overall Financial Resource Strain (CARDIA)    Difficulty of Paying Living Expenses: Not hard at all  Food Insecurity: No Food Insecurity (08/24/2023)   Received from [facility]   Hunger Vital Sign    Within the past 12 months, you worried that your food would run  out before you got the money to buy more.: Never true    Within the past 12 months, the food you bought just didn't last and you didn't have money to get more.: Never true  Transportation Needs: No Transportation Needs (08/24/2023)   Received from [facility] - Transportation    Lack of Transportation (Medical): No    Lack of Transportation (Non-Medical): No  Physical Activity: Sufficiently Active (08/24/2023)   Received from [facility]   Exercise Vital Sign    On average, how many days per week do you engage in moderate to strenuous exercise (like a brisk walk)?: 5 days    On average, how many minutes do you engage in exercise at this level?: 30 min  Stress: No Stress Concern Present (08/24/2023)   Received from [facility] - Occupational Stress Questionnaire    Feeling of Stress : Not at all  Social Connections: Moderately Isolated (08/24/2023)   Received from [facility]   Social Connection and Isolation Panel    In a typical week, how many times do you talk on the phone with family, friends, or neighbors?: More than three times a week    How often do you get together with friends or relatives?: More than three times a week    How often do you attend church or religious services?: Never    Do you belong to any clubs or organizations such as church groups, unions, fraternal or athletic groups, or school groups?: Yes    How often do you attend meetings of the clubs or organizations you belong to?: More than 4 times per year    Are you married, widowed,  divorced, separated, never married, or living with a partner?: Widowed  Intimate Partner Violence: Not At Risk (08/24/2023)   Received from [facility]   Humiliation, Afraid, Rape, and Kick questionnaire    Within the last year, have you been afraid of your partner or ex-partner?: No    Within the last year, have you been humiliated or emotionally abused in other ways by your partner or ex-partner?: No    Within the last year, have you been kicked, hit, slapped, or otherwise physically hurt by your partner or ex-partner?: No    Within the last year, have you been raped or forced to have any kind of sexual activity by your partner or ex-partner?: No      Family History  Problem Relation Age of Onset   Cancer Mother        breast   Heart disease Father    Diabetes Father    Hypertension Father    Cancer Maternal Grandmother    Cancer Maternal Grandfather    Heart disease Paternal Grandmother    Heart disease Paternal Grandfather    Cancer Maternal Aunt    Cancer Maternal Uncle    Heart disease Paternal Aunt    Heart disease Paternal Uncle    Kidney cancer Neg Hx    Bladder Cancer Neg Hx    Vitals:   05/23/24 1154  BP: 136/71  Pulse: 88  SpO2: 98%  Weight: 181 lb (82.1 kg)   Wt Readings from Last 3 Encounters:  05/23/24 181 lb (82.1 kg)  05/14/24 175 lb (79.4 kg)  03/22/24 170 lb (77.1 kg)   Lab Results  Component Value Date   CREATININE 1.01 (H) 05/14/2024   CREATININE 0.89 03/19/2024   CREATININE 0.70  11/08/2023    PHYSICAL EXAM:  General: Well appearing. No resp difficulty HEENT: normal Neck: supple, no JVD Cor: Regular rhythm, rate. No rubs, gallops or murmurs Lungs: clear Abdomen: soft, nontender, distended although soft. Extremities: no cyanosis, clubbing, rash, 2+ pitting edema bilaterally with R>L Neuro: alert & oriented X 3. Has boot on right foot. Affect pleasant   ECG: not done  1: Chronic heart failure with preserved ejection fraction- -  unclear etiology at this time - NYHA Murphy III - moderately fluid up with symptoms and edema - reviewed weighing daily with parameters to discuss - Echo 07/09/22: EF >55%, G1DD - order for updated echo to be done - continue furosemide  20mg  daily - begin jardiance 10mg  daily - begin spironolactone 12.5mg  every day - stop lisinopril  - BMET in 7-10 days and at next visit in 1 month - reviewed daily sodium intake (2000mg ) and fluid intake (60-64oz) to follow - BNP 05/14/24 was 22.1  2: DM- - A1c 02/24/24 was 6.4% - saw PCP Adrianne) 11/24; returns tomorrow - continue metformin, ozempic  3: PE- - BMP 05/14/24 reviewed: sodium 140, potassium 3.9, creatinine 1.01 & GFR >60 - has been on daily xarelto  in the past  4: Lymphedema- - saw vascular ([facility]) 06/25 - wearing compression boots BID with some improvement of edema although she says that edema quickly returns  5: Hyperlipidemia- - continue atorvastatin  20mg  daily - LDL 08/19/23 was 56   Return in 1 month, sooner if needed.   Courtney DELENA Class, FNP 05/22/24

## 2024-05-23 ENCOUNTER — Encounter: Payer: Self-pay | Admitting: Family

## 2024-05-23 ENCOUNTER — Ambulatory Visit: Attending: Family | Admitting: Family

## 2024-05-23 VITALS — BP 136/71 | HR 88 | Wt 181.0 lb

## 2024-05-23 DIAGNOSIS — Z7985 Long-term (current) use of injectable non-insulin antidiabetic drugs: Secondary | ICD-10-CM | POA: Insufficient documentation

## 2024-05-23 DIAGNOSIS — I890 Lymphedema, not elsewhere classified: Secondary | ICD-10-CM | POA: Insufficient documentation

## 2024-05-23 DIAGNOSIS — Z79899 Other long term (current) drug therapy: Secondary | ICD-10-CM | POA: Insufficient documentation

## 2024-05-23 DIAGNOSIS — R0602 Shortness of breath: Secondary | ICD-10-CM | POA: Insufficient documentation

## 2024-05-23 DIAGNOSIS — D6869 Other thrombophilia: Secondary | ICD-10-CM | POA: Insufficient documentation

## 2024-05-23 DIAGNOSIS — Z86711 Personal history of pulmonary embolism: Secondary | ICD-10-CM | POA: Diagnosis not present

## 2024-05-23 DIAGNOSIS — Z7901 Long term (current) use of anticoagulants: Secondary | ICD-10-CM | POA: Diagnosis not present

## 2024-05-23 DIAGNOSIS — E119 Type 2 diabetes mellitus without complications: Secondary | ICD-10-CM | POA: Insufficient documentation

## 2024-05-23 DIAGNOSIS — D6861 Antiphospholipid syndrome: Secondary | ICD-10-CM | POA: Insufficient documentation

## 2024-05-23 DIAGNOSIS — I5032 Chronic diastolic (congestive) heart failure: Secondary | ICD-10-CM | POA: Insufficient documentation

## 2024-05-23 DIAGNOSIS — E785 Hyperlipidemia, unspecified: Secondary | ICD-10-CM | POA: Diagnosis not present

## 2024-05-23 DIAGNOSIS — I110 Hypertensive heart disease with heart failure: Secondary | ICD-10-CM | POA: Insufficient documentation

## 2024-05-23 DIAGNOSIS — R600 Localized edema: Secondary | ICD-10-CM | POA: Diagnosis not present

## 2024-05-23 DIAGNOSIS — Z7984 Long term (current) use of oral hypoglycemic drugs: Secondary | ICD-10-CM | POA: Insufficient documentation

## 2024-05-23 MED ORDER — SPIRONOLACTONE 25 MG PO TABS: 12.5000 mg | ORAL_TABLET | Freq: Every day | ORAL | 3 refills | Status: DC

## 2024-05-23 MED ORDER — JARDIANCE 10 MG PO TABS: 10.0000 mg | ORAL_TABLET | Freq: Every day | ORAL | 5 refills | Status: DC

## 2024-05-23 NOTE — Patient Instructions (Addendum)
 Someone will call you to confirm when your echocardiogram is scheduled. Please use the main entrance [facility]) and check in at the registration desk for your echo. Your physician has requested that you have an echocardiogram. Echocardiography is a painless test that uses sound waves to create images of your heart. It provides your doctor with information about the size and shape of your heart and how well your heart's chambers and valves are working. This procedure takes approximately one hour. There are no restrictions for this procedure. Please do NOT wear cologne, perfume, aftershave, or lotions (deodorant is allowed). Please arrive 15 minutes prior to your appointment time.  Please note: We ask at that you not bring children with you during ultrasound (echo/ vascular) testing. Due to room size and safety concerns, children are not allowed in the ultrasound rooms during exams. Our front office staff cannot provide observation of children in our lobby area while testing is being conducted. An adult accompanying a patient to their appointment will only be allowed in the ultrasound room at the discretion of the ultrasound technician under special circumstances. We apologize for any inconvenience.  Medication: START Spironolactone 12.5 mg daily START Jardiance 10 mg daily STOP Lisinopril    Lab: Your physician recommends that you return for lab work in: 7-10 days. Please use the main entrance [facility]) and let them know at the registration desk that you are here for lab work. The order will already be in. We will call you with any abnormal results.  Continue weighing daily and call for an overnight weight gain of 3 pounds or more or a weekly weight gain of more than 5 pounds.  Daily sodium intake should be 2000mg .   Daily fluid intake should be 60-64 ounces.   It was a pleasure meeting you today!

## 2024-05-24 ENCOUNTER — Ambulatory Visit: Admitting: Physical Therapy

## 2024-05-29 ENCOUNTER — Inpatient Hospital Stay: Attending: Internal Medicine

## 2024-05-29 ENCOUNTER — Inpatient Hospital Stay

## 2024-05-29 ENCOUNTER — Encounter: Payer: Self-pay | Admitting: Internal Medicine

## 2024-05-29 ENCOUNTER — Inpatient Hospital Stay (HOSPITAL_BASED_OUTPATIENT_CLINIC_OR_DEPARTMENT_OTHER): Admitting: Internal Medicine

## 2024-05-29 VITALS — BP 96/70 | HR 93 | Temp 97.1°F | Resp 16 | Ht 64.0 in | Wt 174.0 lb

## 2024-05-29 VITALS — BP 109/63

## 2024-05-29 DIAGNOSIS — E538 Deficiency of other specified B group vitamins: Secondary | ICD-10-CM | POA: Insufficient documentation

## 2024-05-29 DIAGNOSIS — R760 Raised antibody titer: Secondary | ICD-10-CM

## 2024-05-29 DIAGNOSIS — Z79899 Other long term (current) drug therapy: Secondary | ICD-10-CM | POA: Diagnosis not present

## 2024-05-29 DIAGNOSIS — D649 Anemia, unspecified: Secondary | ICD-10-CM | POA: Diagnosis not present

## 2024-05-29 DIAGNOSIS — D509 Iron deficiency anemia, unspecified: Secondary | ICD-10-CM | POA: Insufficient documentation

## 2024-05-29 DIAGNOSIS — E611 Iron deficiency: Secondary | ICD-10-CM

## 2024-05-29 DIAGNOSIS — K909 Intestinal malabsorption, unspecified: Secondary | ICD-10-CM

## 2024-05-29 LAB — CBC WITH DIFFERENTIAL (CANCER CENTER ONLY)
Abs Immature Granulocytes: 0.03 10*3/uL (ref 0.00–0.07)
Basophils Absolute: 0.1 10*3/uL (ref 0.0–0.1)
Basophils Relative: 1 %
Eosinophils Absolute: 0.3 10*3/uL (ref 0.0–0.5)
Eosinophils Relative: 3 %
HCT: 35.1 % — ABNORMAL LOW (ref 36.0–46.0)
Hemoglobin: 11.7 g/dL — ABNORMAL LOW (ref 12.0–15.0)
Immature Granulocytes: 0 %
Lymphocytes Relative: 27 %
Lymphs Abs: 2.6 10*3/uL (ref 0.7–4.0)
MCH: 30.2 pg (ref 26.0–34.0)
MCHC: 33.3 g/dL (ref 30.0–36.0)
MCV: 90.5 fL (ref 80.0–100.0)
Monocytes Absolute: 0.6 10*3/uL (ref 0.1–1.0)
Monocytes Relative: 6 %
Neutro Abs: 6 10*3/uL (ref 1.7–7.7)
Neutrophils Relative %: 63 %
Platelet Count: 274 10*3/uL (ref 150–400)
RBC: 3.88 MIL/uL (ref 3.87–5.11)
RDW: 12.6 % (ref 11.5–15.5)
WBC Count: 9.5 10*3/uL (ref 4.0–10.5)
nRBC: 0 % (ref 0.0–0.2)

## 2024-05-29 LAB — IRON AND TIBC
Iron: 65 ug/dL (ref 28–170)
Saturation Ratios: 16 % (ref 10.4–31.8)
TIBC: 416 ug/dL (ref 250–450)
UIBC: 351 ug/dL

## 2024-05-29 LAB — BASIC METABOLIC PANEL - CANCER CENTER ONLY
Anion gap: 10 (ref 5–15)
BUN: 23 mg/dL (ref 8–23)
CO2: 27 mmol/L (ref 22–32)
Calcium: 9.1 mg/dL (ref 8.9–10.3)
Chloride: 101 mmol/L (ref 98–111)
Creatinine: 0.94 mg/dL (ref 0.44–1.00)
GFR, Estimated: >60 mL/min (ref >60)
Glucose, Bld: 140 mg/dL — ABNORMAL HIGH (ref 70–99)
Potassium: 3.9 mmol/L (ref 3.5–5.1)
Sodium: 138 mmol/L (ref 135–145)

## 2024-05-29 LAB — FERRITIN: Ferritin: 58 ng/mL (ref 11–307)

## 2024-05-29 LAB — VITAMIN B12: Vitamin B-12: 268 pg/mL (ref 180–914)

## 2024-05-29 MED ORDER — SODIUM CHLORIDE 0.9 % IV SOLN
Freq: Once | INTRAVENOUS | Status: DC
  Filled 2024-05-29: qty 250

## 2024-05-29 MED ORDER — IRON SUCROSE 20 MG/ML IV SOLN
200.0000 mg | Freq: Once | INTRAVENOUS | Status: AC
  Administered 2024-05-29: 200 mg via INTRAVENOUS

## 2024-05-29 NOTE — Assessment & Plan Note (Addendum)
#  Iron  deficiency anemia [ gastric bypass-]-low iron  saturation/ferritin.  S/p IV Venofer ;   # Today hemoglobin is 11.5- proceed venofer .  # b12 def-continue home B12 injections  # Chronic CHF [[facility]]- heart failure clinic- lasix , jardiance ; and aldactone .   # DM- on Ozempic/metformin-   # DISPOSITION: # Venofer  today # venofer  in 1 week-  # follow up in 4 months-MD; labs- cbc/bmp/iiron studies/ferritin; b12 levels heriberto venofer -Dr.B

## 2024-05-29 NOTE — Patient Instructions (Signed)

## 2024-05-29 NOTE — Progress Notes (Signed)
 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  Courtney Murphy 68 y.o.  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  Patient here for follow up; Per patient she was recently diagnosed with CHF.  Patient is on diuretics and lost weight.   No blood in stools or black-colored stools.   Review of Systems  Constitutional:  Positive for malaise/fatigue. Negative for chills, diaphoresis, fever and weight loss.  HENT:  Negative for nosebleeds and sore throat.   Eyes:  Negative for double vision.  Respiratory:  Negative for cough, hemoptysis, sputum production, shortness of breath and wheezing.   Cardiovascular:  Negative for chest pain, palpitations, orthopnea and leg swelling.  Gastrointestinal:  Negative for abdominal pain, blood in stool, constipation, diarrhea, heartburn, melena, nausea and vomiting.  Genitourinary:  Negative for dysuria, frequency and urgency.  Musculoskeletal:  Positive for back pain and joint pain.  Skin: Negative.  Negative for  itching and rash.  Neurological:  Negative for dizziness, tingling, focal weakness, weakness and headaches.  Endo/Heme/Allergies:  Bruises/bleeds easily.  Psychiatric/Behavioral:  Negative for depression. The patient is not nervous/anxious and does not have insomnia.      MEDICAL HISTORY:  Past Medical History:  Diagnosis Date   Acid reflux 10/11/2015   Anemia    RECEIVES IRON  INFUSIONS   Arthritis    Asthma    WELL CONTROLLED   Depression    Diabetes mellitus without complication (HCC)    Fatty liver    Gout    History of kidney stones    History of methicillin resistant staphylococcus aureus (MRSA) 2016   Hypertension    H/O BEEN OFF BP MEDS FOR 1 YEAR   Hypothyroidism    Kidney stone    Knee pain    Left   Pulmonary emboli (HCC) 2011   Restless leg syndrome    Shoulder pain     SURGICAL HISTORY: Past Surgical History:  Procedure Laterality Date   ABDOMINAL HYSTERECTOMY     BACK SURGERY     BREAST REDUCTION SURGERY     CATARACT EXTRACTION W/PHACO Left 07/28/2017   Procedure: CATARACT EXTRACTION PHACO AND INTRAOCULAR LENS PLACEMENT (IOC) diabetic Left;  Surgeon: Mittie Gaskin, MD;  Location: [facility];  Service: Ophthalmology;  Laterality: Left;  Diabetic   CESAREAN SECTION  1990   CHOLECYSTECTOMY     CYSTOSCOPY/URETEROSCOPY/HOLMIUM LASER/STENT PLACEMENT Left 09/08/2017   Procedure: CYSTOSCOPY/URETEROSCOPY/HOLMIUM LASER/STENT EXCHANGE;  Surgeon: Penne Knee, MD;  Location: [facility];  Service: Urology;  Laterality: Left;   CYSTOSCOPY/URETEROSCOPY/HOLMIUM LASER/STENT PLACEMENT Left 12/14/2018   Procedure: CYSTOSCOPY/URETEROSCOPY/HOLMIUM LASER/STENT PLACEMENT;  Surgeon: Penne Knee, MD;  Location: [facility];  Service: Urology;  Laterality: Left;   FOOT SURGERY  GASTRIC BYPASS     IR NEPHROSTOMY PLACEMENT LEFT  08/17/2017   KNEE ARTHROSCOPY Left    KNEE ARTHROSCOPY Left 07/19/2015   Procedure: ARTHROSCOPY KNEE;  Surgeon: Helayne Glenn, MD;   Location: [facility];  Service: Orthopedics;  Laterality: Left;   NEPHROLITHOTOMY Left 08/17/2017   Procedure: NEPHROLITHOTOMY PERCUTANEOUS WITH HOLMIUM LASER;  Surgeon: Penne Knee, MD;  Location: [facility];  Service: Urology;  Laterality: Left;   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/2021   THUMB ARTHROSCOPY     TONSILLECTOMY      SOCIAL HISTORY: Social History   Socioeconomic History   Marital status: Widowed    Spouse name: Not on file   Number of children: Not on file   Years of education: Not on file   Highest education level: Not on file  Occupational History   Not on file  Tobacco Use   Smoking status: Former    Current packs/day: 0.00    Average packs/day: 0.5 packs/day for 4.0 years (2.0 ttl pk-yrs)    Types: Cigarettes    Start date: 04/14/2011    Quit date: 04/14/2015    Years since quitting: 9.1   Smokeless tobacco: Never  Vaping Use   Vaping status: Never Used  Substance and Sexual Activity   Alcohol use: Not Currently    Comment: RARE   Drug use: No   Sexual activity: Never  Other Topics Concern   Not on file  Social History Narrative   Quit smoking > 30 years ago; rare alcohol. 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 Lives in [place]; with dog.    Social Drivers of Corporate investment banker Strain: Low Risk  (08/24/2023)   Received from [facility]   Overall Financial Resource Strain (CARDIA)    Difficulty of Paying Living Expenses: Not hard at all  Food Insecurity: No Food Insecurity (08/24/2023)   Received from [facility]   Hunger Vital Sign    Within the past 12 months, you worried that your food would run out before you got the money to buy more.: Never true    Within the past 12 months, the food you bought just didn't last and you didn't have money to get more.: Never true  Transportation Needs: No Transportation Needs (08/24/2023)   Received from [facility]   PRAPARE - Transportation    Lack  of Transportation (Medical): No    Lack of Transportation (Non-Medical): No  Physical Activity: Sufficiently Active (08/24/2023)   Received from [facility]   Exercise Vital Sign    On average, how many days per week do you engage in moderate to strenuous exercise (like a brisk walk)?: 5 days    On average, how many minutes do you engage in exercise at this level?: 30 min  Stress: No Stress Concern Present (08/24/2023)   Received from [facility] - Occupational Stress Questionnaire    Feeling of Stress : Not at all  Social Connections: Moderately Isolated (08/24/2023)   Received from [facility]   Social Connection and Isolation Panel    In a typical week, how many times do you talk on the phone with family, friends, or neighbors?: More than three times a week    How often do you get together with friends or relatives?: More than three times a week    How often do you attend church or religious services?: Never  Do you belong to any clubs or organizations such as church groups, unions, fraternal or athletic groups, or school groups?: Yes    How often do you attend meetings of the clubs or organizations you belong to?: More than 4 times per year    Are you married, widowed, divorced, separated, never married, or living with a partner?: Widowed  Intimate Partner Violence: Not At Risk (08/24/2023)   Received from [facility]   Humiliation, Afraid, Rape, and Kick questionnaire    Within the last year, have you been afraid of your partner or ex-partner?: No    Within the last year, have you been humiliated or emotionally abused in other ways by your partner or ex-partner?: No    Within the last year, have you been kicked, hit, slapped, or otherwise physically hurt by your partner or ex-partner?: No    Within the last year, have you been raped or forced to have any kind of sexual activity by your partner or ex-partner?: No    FAMILY  HISTORY: Family History  Problem Relation Age of Onset   Cancer Mother        breast   Heart disease Father    Diabetes Father    Hypertension Father    Cancer Maternal Grandmother    Cancer Maternal Grandfather    Heart disease Paternal Grandmother    Heart disease Paternal Grandfather    Cancer Maternal Aunt    Cancer Maternal Uncle    Heart disease Paternal Aunt    Heart disease Paternal Uncle    Kidney cancer Neg Hx    Bladder Cancer Neg Hx     ALLERGIES:  is allergic to sulfasalazine, nsaids, sulfa antibiotics, and penicillins.  MEDICATIONS:  Current Outpatient Medications  Medication Sig Dispense Refill   albuterol  (VENTOLIN  HFA) 108 (90 Base) MCG/ACT inhaler Inhale 2 puffs into the lungs every 6 (six) hours as needed.     atomoxetine  (STRATTERA ) 40 MG capsule      atorvastatin  (LIPITOR) 20 MG tablet Take 20 mg by mouth every morning.      cyanocobalamin  (,VITAMIN B-12,) 1000 MCG/ML injection Inject 1,000 mcg into the muscle once a week.     cyclobenzaprine  (FLEXERIL ) 10 MG tablet Take 10 mg by mouth every 8 (eight) hours.     doxepin (SINEQUAN) 10 MG capsule Take by mouth.     DULoxetine HCl 40 MG CPEP Take 1 capsule by mouth daily.     empagliflozin  (JARDIANCE ) 10 MG TABS tablet Take 10 mg by mouth daily.     fluticasone (FLONASE) 50 MCG/ACT nasal spray Place 2 sprays into both nostrils daily as needed for rhinitis or allergies.     furosemide  (LASIX ) 20 MG tablet Take 20 mg by mouth daily.     HYDROcodone -acetaminophen  (NORCO ) 10-325 MG tablet Take 1 tablet by mouth every 6 (six) hours as needed for severe pain (pain score 7-10).     JARDIANCE  10 MG TABS tablet Take 1 tablet (10 mg total) by mouth daily before breakfast. 30 tablet 5   levothyroxine  (SYNTHROID , LEVOTHROID) 100 MCG tablet Take 100 mcg by mouth daily before breakfast.     metFORMIN (GLUCOPHAGE-XR) 750 MG 24 hr tablet Take by mouth.     OZEMPIC, 2 MG/DOSE, 8 MG/3ML SOPN Inject 2 mg into the skin once a  week.     pramipexole  (MIRAPEX ) 0.5 MG tablet Take 0.5-1 mg by mouth at bedtime.      pregabalin (LYRICA) 50 MG capsule Take 150  mg by mouth 2 (two) times daily.     spironolactone  (ALDACTONE ) 25 MG tablet Take 0.5 tablets (12.5 mg total) by mouth daily. 45 tablet 3   spironolactone  (ALDACTONE ) 25 MG tablet Take 12.5 mg by mouth.     Semaglutide,0.25 or 0.5MG /DOS, (OZEMPIC, 0.25 OR 0.5 MG/DOSE,) 2 MG/1.5ML SOPN Inject 0.25 mg into the skin.     No current facility-administered medications for this visit.   Facility-Administered Medications Ordered in Other Visits  Medication Dose Route Frequency Provider Last Rate Last Admin   0.9 %  sodium chloride  infusion   Intravenous Once Keajah Killough R, MD          .  PHYSICAL EXAMINATION:  Vitals:   05/29/24 1417  BP: 96/70  Pulse: 93  Resp: 16  Temp: (!) 97.1 F (36.2 C)  SpO2: 100%   Filed Weights   05/29/24 1417  Weight: 174 lb (78.9 kg)    Physical Exam HENT:     Head: Normocephalic and atraumatic.     Mouth/Throat:     Pharynx: No oropharyngeal exudate.   Eyes:     Pupils: Pupils are equal, round, and reactive to light.    Cardiovascular:     Rate and Rhythm: Normal rate and regular rhythm.  Pulmonary:     Effort: Pulmonary effort is normal. No respiratory distress.     Breath sounds: Normal breath sounds. No wheezing.  Abdominal:     General: Bowel sounds are normal. There is no distension.     Palpations: Abdomen is soft. There is no mass.     Tenderness: There is no abdominal tenderness. There is no guarding or rebound.   Musculoskeletal:        General: No tenderness. Normal range of motion.     Cervical back: Normal range of motion and neck supple.   Skin:    General: Skin is warm.     Comments: Chronic bruising noted on the upper extremities.  No bruises on the trunk or lower extremities.   Neurological:     Mental Status: She is alert and oriented to person, place, and time.   Psychiatric:         Mood and Affect: Affect normal.      LABORATORY DATA:  I have reviewed the data as listed Lab Results  Component Value Date   WBC 9.5 05/29/2024   HGB 11.7 (L) 05/29/2024   HCT 35.1 (L) 05/29/2024   MCV 90.5 05/29/2024   PLT 274 05/29/2024   Recent Labs    03/19/24 0601 05/14/24 2037 05/29/24 1407  NA 138 140 138  K 3.9 3.9 3.9  CL 99 103 101  CO2 30 26 27   GLUCOSE 118* 121* 140*  BUN 14 20 23   CREATININE 0.89 1.01* 0.94  CALCIUM  9.1 9.0 9.1  GFRNONAA >60 >60 >60    RADIOGRAPHIC STUDIES: I have personally reviewed the radiological images as listed and agreed with the findings in the report. CT Angio Chest PE W and/or Wo Contrast Result Date: 05/14/2024 CLINICAL DATA:  Chest pain, right leg swelling EXAM: CT ANGIOGRAPHY CHEST WITH CONTRAST TECHNIQUE: Multidetector CT imaging of the chest was performed using the standard protocol during bolus administration of intravenous contrast. Multiplanar CT image reconstructions and MIPs were obtained to evaluate the vascular anatomy. RADIATION DOSE REDUCTION: This exam was performed according to the departmental dose-optimization program which includes automated exposure control, adjustment of the mA and/or kV according to patient size and/or use of iterative reconstruction technique.  CONTRAST:  OMNIPAQUE  IOHEXOL  350 MG/ML SOLN COMPARISON:  02/14/2023, 05/14/2024 FINDINGS: Cardiovascular: This is a technically adequate evaluation of the pulmonary vasculature. No filling defects or pulmonary emboli. Heart is enlarged without pericardial effusion. No evidence of thoracic aortic aneurysm or dissection. Atherosclerosis of the aorta and coronary vasculature. Mediastinum/Nodes: No enlarged mediastinal, hilar, or axillary lymph nodes. Thyroid  gland, trachea, and esophagus demonstrate no significant findings. Lungs/Pleura: Scattered bibasilar hypoventilatory changes. No acute airspace disease, effusion, or pneumothorax. Central airways are  patent. Upper Abdomen: No acute abnormality. Musculoskeletal: No acute or destructive bony abnormalities. Reconstructed images demonstrate no additional findings. Review of the MIP images confirms the above findings. IMPRESSION: 1. No evidence of pulmonary embolus. 2. Cardiomegaly. 3. No acute intrathoracic process. 4. Aortic Atherosclerosis (ICD10-I70.0). Coronary artery atherosclerosis. Electronically Signed   By: Ozell Daring M.D.   On: 05/14/2024 21:55   US  Venous Img Lower Unilateral Right Result Date: 05/14/2024 EXAM: ULTRASOUND DUPLEX OF THE RIGHT LOWER EXTREMITY VEINS TECHNIQUE: Duplex ultrasound using B-mode/gray scaled imaging and Doppler spectral analysis and color flow was obtained of the deep venous structures of the right lower extremity. COMPARISON: None. CLINICAL HISTORY: Immobilization from surgery, swelling, pain. Eval DVT. FINDINGS: The visualized veins of the lower extremity are patent and free of echogenic thrombus. The veins demonstrate good compressibility with normal color flow study and spectral analysis. IMPRESSION: 1. No evidence of DVT. Electronically signed by: Pinkie Pebbles MD 05/14/2024 09:10 PM EDT RP Workstation: [id number]   DG Chest Port 1 View Result Date: 05/14/2024 CLINICAL DATA:  [id number] Chest pain [id number] Pt reports right leg swelling and pain. Pt had surgery to her right ankle at the beginning of May, pt wears an orthopedic boot. Pt also reports chest pain and states that she is prone to blood clots. EXAM: PORTABLE CHEST 1 VIEW COMPARISON:  None Available. FINDINGS: The heart and mediastinal contours are within normal limits. Left base atelectasis. Chronic coarsened interstitial marking with no overt pulmonary edema. No pleural effusion. No pneumothorax. No acute osseous abnormality. IMPRESSION: Left base atelectasis with developing superimposed infection/inflammation not excluded. Followup PA and lateral chest X-ray is recommended in 3-4 weeks following therapy to  ensure resolution. Electronically Signed   By: Morgane  Naveau M.D.   On: 05/14/2024 21:07   DG Ankle Complete Right Result Date: 05/10/2024 Please see detailed radiograph report in office note.    ASSESSMENT & PLAN:   Symptomatic anemia #Iron  deficiency anemia [ gastric bypass-]-low iron  saturation/ferritin.  S/p IV Venofer ;   # Today hemoglobin is 11.5- proceed venofer .  # b12 def-continue home B12 injections  # Chronic CHF [[facility] Hackey]- heart failure clinic- lasix , jardiance ; and aldactone .   # DM- on Ozempic/metformin-   # DISPOSITION: # Venofer  today # venofer  in 1 week-  # follow up in 4 months-MD; labs- cbc/bmp/iiron studies/ferritin; b12 levels heriberto venofer -Dr.B   All questions were answered. The patient knows to call the clinic with any problems, questions or concerns.   Cindy JONELLE Joe, MD 05/29/2024 3:24 PM

## 2024-05-29 NOTE — Progress Notes (Signed)
 Patient here for follow up; Per patient she was recently diagnosed with CHF.

## 2024-05-29 NOTE — Assessment & Plan Note (Deleted)
#  Iron deficiency anemia [ gastric bypass-]-low iron saturation/ferritin.  S/p IV Venofer;   # Today hemoglobin is 11.5- proceed venofer.  # b12 def-continue home B12 injections.  # Left leg pain/calf; and swelling around ankle [facility] 2024- s/p anle fusion]- s/p [place] in clinic as per pt- NO blood clot. On xarelto 10 mg/day [per ortho]. # refer to vascular re: varicose veins/left leg swelling   # DISPOSITION: # refer to vascular re: varicose veins/left leg swelling # Venofer today # venofer in 1 week-  # follow up in 4 months-MD; labs- cbc/bmp/iiron studies/ferritin; b12 levels Leveda Anna venofer-Dr.B

## 2024-05-30 ENCOUNTER — Encounter: Admitting: Physical Therapy

## 2024-05-30 ENCOUNTER — Encounter: Admitting: Family

## 2024-06-01 ENCOUNTER — Encounter: Admitting: Physical Therapy

## 2024-06-05 ENCOUNTER — Inpatient Hospital Stay: Attending: Internal Medicine

## 2024-06-05 ENCOUNTER — Encounter: Admitting: Physical Therapy

## 2024-06-08 ENCOUNTER — Telehealth: Payer: Self-pay | Admitting: Adult Health

## 2024-06-08 ENCOUNTER — Encounter: Admitting: Physical Therapy

## 2024-06-08 MED ORDER — FUROSEMIDE 20 MG PO TABS: 40.0000 mg | ORAL_TABLET | Freq: Every day | ORAL | Status: DC

## 2024-06-08 NOTE — Telephone Encounter (Signed)
 Pt called concerned about weight gain from 171 to 175lbs. Pt denies shortness of breath, or abdominal swelling. Pt states that her right leg (which is normally swollen) is more swollen and that she has had chest heaviness for 2-3 days. Per Ellouise Class, FNP pt to increase Furosemide  to 40mg  daily and give us  a call Friday 7/11 to report back symptoms. If pt still feeling poorly, pt to have appointment Friday. If symptoms improve, pt to have appointment Monday or Tuesday of next week.

## 2024-06-12 ENCOUNTER — Encounter: Admitting: Physical Therapy

## 2024-06-14 ENCOUNTER — Encounter: Admitting: Physical Therapy

## 2024-06-15 NOTE — Telephone Encounter (Signed)
 Please route to [facility]. I am not covering [facility].   Airyonna Franklyn NP-C  1:26 PM

## 2024-06-16 ENCOUNTER — Telehealth: Payer: Self-pay

## 2024-06-16 NOTE — Telephone Encounter (Signed)
 Called to follow up with pt after having leg swelling last week. Pt states that she feels just fine and that her leg swelling has went down. Pt states that she only took furosemide  40mg  the one day and then resumed her normal dose of 20mg  daily. Offered pt earlier appt than scheduled follow up and the pt refused. She said that she wanted to keep her scheduled appt. No further questions at this time.

## 2024-06-19 ENCOUNTER — Encounter: Admitting: Physical Therapy

## 2024-06-20 ENCOUNTER — Telehealth: Payer: Self-pay | Admitting: Family

## 2024-06-20 NOTE — Telephone Encounter (Signed)
 Called to confirm/remind patient of [facility].   Appointment:   [x] Confirmed  [] Left mess   [] No answer/No voice mail  [] VM Full/unable to leave message  [] Phone not in service  Patient reminded to bring all medications and/or complete list.  Confirmed patient has transportation. Gave directions, instructed to utilize valet parking.

## 2024-06-20 NOTE — Progress Notes (Unsigned)
 Advanced [facility] Failure Clinic Note   Referring Physician: EDP PCP: Krasovich, Janelle, MD Cardiologist: None   Chief Complaint: fatigue   HPI:  Courtney Murphy is a 68 y/o female with a history of obesity, PE, DM, HFpEF, lymphedema, right ankle fracture/ repair 05/25 and hypercoagulable due to antiphospholipid antibody syndrome on prophylactic Xarelto .    Was in the ED 05/14/24 due to leg pain and chest pain. Had increased lower extremity swelling, R>L, orthopnea, chest discomfort, abdominal swelling and was concerned explicitly about a DVT or PE. Positive D-dimer, negative venous ultrasound and CTA chest. Troponins and BNP low. Concern for possible HF so referral made to HF clinic.   Seen in [facility] 06/25 where jardiance  10mg  daily and 12.5mg  spironolactone  daily were started.   She presents today for a HF follow-up visit with a chief complaint of shortness of breath. Has associated fatigue (worse since yesterday), abdominal distention, chronic difficulty sleeping, left shoulder pain. Swelling in right lower leg from fracture, just recently came out of the boot. Denies apnea. Says that she only sleeps for 2-3 hours at a time and has done so chronically.   Not adding salt but lives alone so eats out frequently and admits that she doesn't always make the best choices. Unclear as to daily fluid intake. Denies tobacco, alcohol or drug use. 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   Says that she had her echo done earlier today through her PCP at [facility].   ROS: All systems negative except what is listed in HPI, PMH and Problem List  Past Medical History:  Diagnosis Date   Acid reflux 10/11/2015   Anemia    RECEIVES IRON  INFUSIONS   Arthritis    Asthma    WELL CONTROLLED   Depression    Diabetes mellitus without complication (HCC)    Fatty liver    Gout    History of kidney stones    History of methicillin resistant staphylococcus aureus (MRSA)  2016   Hypertension    H/O BEEN OFF BP MEDS FOR 1 YEAR   Hypothyroidism    Kidney stone    Knee pain    Left   Pulmonary emboli (HCC) 2011   Restless leg syndrome    Shoulder pain     Current Outpatient Medications  Medication Sig Dispense Refill   albuterol  (VENTOLIN  HFA) 108 (90 Base) MCG/ACT inhaler Inhale 2 puffs into the lungs every 6 (six) hours as needed.     atomoxetine  (STRATTERA ) 40 MG capsule      atorvastatin  (LIPITOR) 20 MG tablet Take 20 mg by mouth every morning.      cyanocobalamin  (,VITAMIN B-12,) 1000 MCG/ML injection Inject 1,000 mcg into the muscle once a week.     cyclobenzaprine  (FLEXERIL ) 10 MG tablet Take 10 mg by mouth every 8 (eight) hours.     doxepin (SINEQUAN) 10 MG capsule Take by mouth.     DULoxetine HCl 40 MG CPEP Take 1 capsule by mouth daily.     empagliflozin  (JARDIANCE ) 10 MG TABS tablet Take 10 mg by mouth daily.     fluticasone (FLONASE) 50 MCG/ACT nasal spray Place 2 sprays into both nostrils daily as needed for rhinitis or allergies.     furosemide  (LASIX ) 20 MG tablet Take 2 tablets (40 mg total) by mouth daily.     HYDROcodone -acetaminophen  (NORCO ) 10-325 MG tablet Take 1 tablet by mouth every 6 (six) hours as needed for severe  pain (pain score 7-10).     JARDIANCE  10 MG TABS tablet Take 1 tablet (10 mg total) by mouth daily before breakfast. 30 tablet 5   levothyroxine  (SYNTHROID , LEVOTHROID) 100 MCG tablet Take 100 mcg by mouth daily before breakfast.     metFORMIN (GLUCOPHAGE-XR) 750 MG 24 hr tablet Take by mouth.     OZEMPIC, 2 MG/DOSE, 8 MG/3ML SOPN Inject 2 mg into the skin once a week.     pramipexole  (MIRAPEX ) 0.5 MG tablet Take 0.5-1 mg by mouth at bedtime.      pregabalin (LYRICA) 50 MG capsule Take 150 mg by mouth 2 (two) times daily.     Semaglutide,0.25 or 0.5MG /DOS, (OZEMPIC, 0.25 OR 0.5 MG/DOSE,) 2 MG/1.5ML SOPN Inject 0.25 mg into the skin.     spironolactone  (ALDACTONE ) 25 MG tablet Take 0.5 tablets (12.5 mg total) by mouth  daily. 45 tablet 3   spironolactone  (ALDACTONE ) 25 MG tablet Take 12.5 mg by mouth.     No current facility-administered medications for this visit.    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       Social History   Socioeconomic History   Marital status: Widowed    Spouse name: Not on file   Number of children: Not on file   Years of education: Not on file   Highest education level: Not on file  Occupational History   Not on file  Tobacco Use   Smoking status: Former    Current packs/day: 0.00    Average packs/day: 0.5 packs/day for 4.0 years (2.0 ttl pk-yrs)    Types: Cigarettes    Start date: 04/14/2011    Quit date: 04/14/2015    Years since quitting: 9.1   Smokeless tobacco: Never  Vaping Use   Vaping status: Never Used  Substance and Sexual Activity   Alcohol use: Not Currently    Comment: RARE   Drug use: No   Sexual activity: Never  Other Topics Concern   Not on file  Social History Narrative   Quit smoking > 30 years ago; rare alcohol. 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 Lives in [place]; with dog.    Social Drivers of Corporate investment banker Strain: Low Risk  (08/24/2023)   Received from [facility]   Overall Financial Resource Strain (CARDIA)    Difficulty of Paying Living Expenses: Not hard at all  Food Insecurity: No Food Insecurity (08/24/2023)    Received from [facility]   Hunger Vital Sign    Within the past 12 months, you worried that your food would run out before you got the money to buy more.: Never true    Within the past 12 months, the food you bought just didn't last and you didn't have money to get more.: Never true  Transportation Needs: No Transportation Needs (08/24/2023)   Received from [facility]  PRAPARE - Administrator, Civil Service (Medical): No    Lack of Transportation (Non-Medical): No  Physical Activity: Sufficiently Active (08/24/2023)   Received from [facility]   Exercise Vital Sign    On average, how many days per week do you engage in moderate to strenuous exercise (like a brisk walk)?: 5 days    On average, how many minutes do you engage in exercise at this level?: 30 min  Stress: No Stress Concern Present (08/24/2023)   Received from [facility] - Occupational Stress Questionnaire    Feeling of Stress : Not at all  Social Connections: Moderately Isolated (08/24/2023)   Received from [facility]   Social Connection and Isolation Panel    In a typical week, how many times do you talk on the phone with family, friends, or neighbors?: More than three times a week    How often do you get together with friends or relatives?: More than three times a week    How often do you attend church or religious services?: Never    Do you belong to any clubs or organizations such as church groups, unions, fraternal or athletic groups, or school groups?: Yes    How often do you attend meetings of the clubs or organizations you belong to?: More than 4 times per year    Are you married, widowed, divorced, separated, never married, or living with a partner?: Widowed  Intimate Partner Violence: Not At Risk (08/24/2023)   Received from [facility]   Humiliation, Afraid, Rape, and Kick questionnaire    Within the last year, have you been afraid of  your partner or ex-partner?: No    Within the last year, have you been humiliated or emotionally abused in other ways by your partner or ex-partner?: No    Within the last year, have you been kicked, hit, slapped, or otherwise physically hurt by your partner or ex-partner?: No    Within the last year, have you been raped or forced to have any kind of sexual activity by your partner or ex-partner?: No      Family History  Problem Relation Age of Onset   Cancer Mother        breast   Heart disease Father    Diabetes Father    Hypertension Father    Cancer Maternal Grandmother    Cancer Maternal Grandfather    Heart disease Paternal Grandmother    Heart disease Paternal Grandfather    Cancer Maternal Aunt    Cancer Maternal Uncle    Heart disease Paternal Aunt    Heart disease Paternal Uncle    Kidney cancer Neg Hx    Bladder Cancer Neg Hx    Vitals:   06/21/24 1106  BP: 123/72  Pulse: 92  SpO2: 99%  Weight: 173 lb (78.5 kg)   Wt Readings from Last 3 Encounters:  06/21/24 173 lb (78.5 kg)  05/29/24 174 lb (78.9 kg)  05/23/24 181 lb (82.1 kg)   Lab Results  Component Value Date   CREATININE 0.94 05/29/2024   CREATININE 1.01 (H) 05/14/2024   CREATININE 0.89 03/19/2024    PHYSICAL EXAM:  General: Well appearing.  Cor: No JVD. Regular rhythm, rate.  Lungs: clear Abdomen: soft, nontender, nondistended. Extremities: 1+ pitting edema left lower leg, 2+ pitting right lower leg Neuro:. Affect pleasant   ECG: NSR, HR 84 (personally reviewed)   Assessment & Plan:  1: Chronic heart failure with preserved ejection fraction- - unclear etiology at this time - NYHA class III - minimally fluid up with symptoms/ edema - weight down 8 pounds from last visit here 1 month ago - Echo 07/09/22: EF >55%, G1DD - she reports having echo done earlier today via her PCP at [facility]. She will send us  a mychart message once it has resulted.  - continue furosemide  20mg  daily; may increase  this after getting BMET results back - continue jardiance  10mg  daily - continue spironolactone  12.5mg  every day; consider increasing this after lab results obtained - BMET today - reviewed daily sodium intake (2000mg ) and fluid intake (60-64oz) to follow - BNP 05/14/24 was 22.1 - BMP 05/14/24 reviewed: sodium 140, potassium 3.9, creatinine 1.01 & GFR >60 - BMET, Mg, BNP today - EKG today is NSR  2: DM- - A1c 02/24/24 was 6.4% - sees PCP Adrianne)  - continue metformin, ozempic  3: Lymphedema- - saw vascular Sherrian) 06/25 - wearing compression boots BID with some improvement of edema although she says that edema quickly returns - may increase spironolactone  after getting lab results back today  4: Hyperlipidemia- - continue atorvastatin  20mg  daily - LDL 08/19/23 was 56   Return in 1 month, sooner if needed.   Ellouise DELENA Class, FNP 06/20/24

## 2024-06-21 ENCOUNTER — Ambulatory Visit: Payer: Self-pay | Admitting: Family

## 2024-06-21 ENCOUNTER — Ambulatory Visit (INDEPENDENT_AMBULATORY_CARE_PROVIDER_SITE_OTHER)

## 2024-06-21 ENCOUNTER — Encounter: Payer: Self-pay | Admitting: Family

## 2024-06-21 ENCOUNTER — Other Ambulatory Visit: Payer: Self-pay | Admitting: Family

## 2024-06-21 ENCOUNTER — Encounter: Admitting: Physical Therapy

## 2024-06-21 ENCOUNTER — Other Ambulatory Visit
Admission: RE | Admit: 2024-06-21 | Discharge: 2024-06-21 | Disposition: A | Discharge status: Home / Self Care | Source: Ambulatory Visit | Attending: Family | Admitting: Family

## 2024-06-21 ENCOUNTER — Ambulatory Visit (HOSPITAL_BASED_OUTPATIENT_CLINIC_OR_DEPARTMENT_OTHER): Admitting: Family

## 2024-06-21 ENCOUNTER — Ambulatory Visit (INDEPENDENT_AMBULATORY_CARE_PROVIDER_SITE_OTHER): Admitting: Podiatry

## 2024-06-21 VITALS — Ht 64.0 in | Wt 173.0 lb

## 2024-06-21 VITALS — BP 123/72 | HR 92 | Wt 173.0 lb

## 2024-06-21 DIAGNOSIS — I5032 Chronic diastolic (congestive) heart failure: Secondary | ICD-10-CM

## 2024-06-21 DIAGNOSIS — S82841D Displaced bimalleolar fracture of right lower leg, subsequent encounter for closed fracture with routine healing: Secondary | ICD-10-CM

## 2024-06-21 DIAGNOSIS — E782 Mixed hyperlipidemia: Secondary | ICD-10-CM

## 2024-06-21 DIAGNOSIS — E119 Type 2 diabetes mellitus without complications: Secondary | ICD-10-CM | POA: Diagnosis not present

## 2024-06-21 DIAGNOSIS — I890 Lymphedema, not elsewhere classified: Secondary | ICD-10-CM | POA: Diagnosis not present

## 2024-06-21 LAB — BASIC METABOLIC PANEL WITH GFR
Anion gap: 13 (ref 5–15)
BUN: 22 mg/dL (ref 8–23)
CO2: 28 mmol/L (ref 22–32)
Calcium: 9.8 mg/dL (ref 8.9–10.3)
Chloride: 102 mmol/L (ref 98–111)
Creatinine, Ser: 0.91 mg/dL (ref 0.44–1.00)
GFR, Estimated: >60 mL/min (ref >60)
Glucose, Bld: 146 mg/dL — ABNORMAL HIGH (ref 70–99)
Potassium: 4 mmol/L (ref 3.5–5.1)
Sodium: 143 mmol/L (ref 135–145)

## 2024-06-21 LAB — BRAIN NATRIURETIC PEPTIDE: B Natriuretic Peptide: 10.5 pg/mL (ref 0.0–100.0)

## 2024-06-21 LAB — MAGNESIUM: Magnesium: 2.1 mg/dL (ref 1.7–2.4)

## 2024-06-21 MED ORDER — FUROSEMIDE 20 MG PO TABS: 20.0000 mg | ORAL_TABLET | Freq: Every day | ORAL | Status: DC

## 2024-06-21 MED ORDER — SPIRONOLACTONE 25 MG PO TABS: 25.0000 mg | ORAL_TABLET | Freq: Every day | ORAL | Status: DC

## 2024-06-21 NOTE — Patient Instructions (Signed)
 Medication Changes:  No medication changes today!  Lab Work: Go over to the [facility]ICAL [facility]ALL. Go pass the gift shop and have your blood work completed.  We will only call you if the results are abnormal or if the provider would like to make medication changes.   Follow-Up in: Please follow up with the Advanced Heart Failure Clinic in 1 month with Shawnee Dellen, FNP.  At the [facility] Failure Clinic, you and your health needs are our priority. We have a designated team specialized in the treatment of Heart Failure. This Care Team includes your primary Heart Failure Specialized Cardiologist (physician), Advanced Practice Providers (APPs- Physician Assistants and Nurse Practitioners), and Pharmacist who all work together to provide you with the care you need, when you need it.   You may see any of the following providers on your designated Care Team at your next follow up:  Dr. Jules Oar Dr. Peder Bourdon Dr. Alwin Baars Dr. Judyth Nunnery Shawnee Dellen, FNP Bevely Brush, RPH-CPP  Please be sure to bring in all your medications bottles to every appointment.   Need to Contact Us :  If you have any questions or concerns before your next appointment please send us  a message through [facility] or call our office at [phone].    TO LEAVE A MESSAGE FOR THE NURSE SELECT OPTION 2, PLEASE LEAVE A MESSAGE INCLUDING: YOUR NAME DATE OF BIRTH CALL BACK NUMBER REASON FOR CALL**this is important as we prioritize the call backs  YOU WILL RECEIVE A CALL BACK THE SAME DAY AS LONG AS YOU CALL BEFORE 4:00 PM

## 2024-06-21 NOTE — Progress Notes (Signed)
  Subjective:  Patient ID: Courtney Murphy, female    DOB: 08/23/1956,  MRN: [id number]  Chief Complaint  Patient presents with   Post-op Follow-up    RM 1 Patient here for post-op follow-up of right ankle fracture. Patient state no pain in right ankle, concerned about tenderness on the top of the right foot near the toes.    68 y.o. female returns for post-op check.  Doing well pain has been minimal still little bit swollen  Review of Systems: Negative except as noted in the HPI. Denies N/V/F/Ch.   Objective:  There were no vitals filed for this visit. Body mass index is 29.7 kg/m. Constitutional Well developed. Well nourished.  Vascular Foot warm and well perfused. Capillary refill normal to all digits.  Calf is soft and supple, no posterior calf or knee pain, negative Homans' sign  Neurologic Normal speech. Oriented to person, place, and time. Epicritic sensation to light touch grossly present bilaterally.  Dermatologic Incision well-healed not hypertrophic  Orthopedic: Minimal edema.  No pain to palpation noted about the surgical site.  Excellent range of motion of ankle   Multiple view plain film radiographs: Good consolidation of fracture site with no hardware loosening Assessment:   1. Closed bimalleolar fracture of right ankle with routine healing, subsequent encounter    Plan:  Patient was evaluated and treated and all questions answered.  S/p foot surgery right - Doing well.  May return to regular shoe gear as tolerated.  Follow-up with me in 3 months for 42-month postop visit.  Continue range of motion of ankle joint.  No follow-ups on file.

## 2024-06-26 ENCOUNTER — Encounter: Admitting: Physical Therapy

## 2024-06-28 ENCOUNTER — Emergency Department

## 2024-06-28 ENCOUNTER — Emergency Department
Admission: EM | Admit: 2024-06-28 | Discharge: 2024-06-28 | Disposition: A | Discharge status: Home / Self Care | Attending: Emergency Medicine | Admitting: Emergency Medicine

## 2024-06-28 ENCOUNTER — Encounter: Payer: Self-pay | Admitting: Emergency Medicine

## 2024-06-28 ENCOUNTER — Other Ambulatory Visit: Payer: Self-pay

## 2024-06-28 ENCOUNTER — Encounter: Admitting: Physical Therapy

## 2024-06-28 DIAGNOSIS — I509 Heart failure, unspecified: Secondary | ICD-10-CM | POA: Diagnosis not present

## 2024-06-28 DIAGNOSIS — R252 Cramp and spasm: Secondary | ICD-10-CM | POA: Diagnosis not present

## 2024-06-28 DIAGNOSIS — R079 Chest pain, unspecified: Secondary | ICD-10-CM | POA: Insufficient documentation

## 2024-06-28 HISTORY — DX: Heart failure, unspecified: I50.9

## 2024-06-28 LAB — CBC
HCT: 38.2 % (ref 36.0–46.0)
Hemoglobin: 12.1 g/dL (ref 12.0–15.0)
MCH: 30.5 pg (ref 26.0–34.0)
MCHC: 31.7 g/dL (ref 30.0–36.0)
MCV: 96.2 fL (ref 80.0–100.0)
Platelets: 235 K/uL (ref 150–400)
RBC: 3.97 MIL/uL (ref 3.87–5.11)
RDW: 13.2 % (ref 11.5–15.5)
WBC: 9 K/uL (ref 4.0–10.5)
nRBC: 0 % (ref 0.0–0.2)

## 2024-06-28 LAB — TROPONIN I (HIGH SENSITIVITY)
Troponin I (High Sensitivity): 3 ng/L (ref <18)
Troponin I (High Sensitivity): 5 ng/L (ref <18)

## 2024-06-28 LAB — BASIC METABOLIC PANEL WITH GFR
Anion gap: 14 (ref 5–15)
BUN: 22 mg/dL (ref 8–23)
CO2: 21 mmol/L — ABNORMAL LOW (ref 22–32)
Calcium: 9.2 mg/dL (ref 8.9–10.3)
Chloride: 102 mmol/L (ref 98–111)
Creatinine, Ser: 1.29 mg/dL — ABNORMAL HIGH (ref 0.44–1.00)
GFR, Estimated: 45 mL/min — ABNORMAL LOW (ref >60)
Glucose, Bld: 221 mg/dL — ABNORMAL HIGH (ref 70–99)
Potassium: 3.8 mmol/L (ref 3.5–5.1)
Sodium: 137 mmol/L (ref 135–145)

## 2024-06-28 MED ORDER — IOHEXOL 350 MG/ML SOLN
75.0000 mL | Freq: Once | INTRAVENOUS | Status: AC | PRN
  Administered 2024-06-28: 75 mL via INTRAVENOUS

## 2024-06-28 NOTE — ED Provider Notes (Signed)
 [facility] Provider Note    Event Date/Time   First MD Initiated Contact with Patient 06/28/24 1515     (approximate)   History   Chest Pain   HPI  Courtney Murphy is a 68 year old female with history of CHF, antiphospholipid antibody syndrome, distant PE presenting to the emergency department for evaluation of leg swelling and chest pain.  Patient reports she has had ongoing swelling that is actually improving with her medication, but had noticed some cramping in her legs yesterday as well as some chest pain that started this morning.  With her history of DVT/PE, she was concerned leading her to present to the ER.     Physical Exam   Triage Vital Signs: ED Triage Vitals  Encounter Vitals Group     BP 06/28/24 1141 110/69     Girls Systolic BP Percentile --      Girls Diastolic BP Percentile --      Boys Systolic BP Percentile --      Boys Diastolic BP Percentile --      Pulse Rate 06/28/24 1141 93     Resp 06/28/24 1141 18     Temp 06/28/24 1141 98.8 F (37.1 C)     Temp Source 06/28/24 1141 Oral     SpO2 06/28/24 1141 100 %     Weight 06/28/24 1139 171 lb (77.6 kg)     Height 06/28/24 1139 5' 4 (1.626 m)     Head Circumference --      Peak Flow --      Pain Score 06/28/24 1139 6     Pain Loc --      Pain Education --      Exclude from Growth Chart --     Most recent vital signs: Vitals:   06/28/24 1141  BP: 110/69  Pulse: 93  Resp: 18  Temp: 98.8 F (37.1 C)  SpO2: 100%     General: Awake, interactive  CV:  Regular rate, good peripheral perfusion.  Resp:  Unlabored respirations, lungs clear to auscultation  Abd:  Nondistended.  Neuro:  Symmetric facial movement, fluid speech MSK:  Mild lower extremity swelling, slightly worse on the left, intact DP pulses   ED Results / Procedures / Treatments   Labs (all labs ordered are listed, but only abnormal results are displayed) Labs Reviewed  BASIC METABOLIC PANEL WITH GFR -  Abnormal; Notable for the following components:      Result Value   CO2 21 (*)    Glucose, Bld 221 (*)    Creatinine, Ser 1.29 (*)    GFR, Estimated 45 (*)    All other components within normal limits  CBC  TROPONIN I (HIGH SENSITIVITY)  TROPONIN I (HIGH SENSITIVITY)     EKG EKG independently reviewed and interpreted by myself demonstrates:  EKG demonstrates normal sinus rhythm at a rate of 97, PR 152, QRS 82, QTc 459, no acute ST changes  RADIOLOGY Imaging independently reviewed and interpreted by myself demonstrates:  Ultrasound without evidence of DVT Chest x-Jaythen Hamme without acute finding CT of the chest pending  Formal Radiology Read:  DG Chest 2 View Result Date: 06/28/2024 CLINICAL DATA:  Chest pain. EXAM: CHEST - 2 VIEW COMPARISON:  05/14/2024. FINDINGS: The heart size and mediastinal contours are within normal limits. Mild linear scarring/atelectasis at the left lung base. No focal consolidation, pleural effusion, or pneumothorax. Degenerative changes of the thoracic spine. No acute osseous abnormality. IMPRESSION: Mild linear scarring/atelectasis at  the left lung base. Otherwise, no acute cardiopulmonary findings. Electronically Signed   By: Harrietta Sherry M.D.   On: 06/28/2024 13:03   US  Venous Img Lower Unilateral Left Result Date: 06/28/2024 CLINICAL DATA:  Acute left calf pain. EXAM: Left LOWER EXTREMITY VENOUS DOPPLER ULTRASOUND TECHNIQUE: Gray-scale sonography with compression, as well as color and duplex ultrasound, were performed to evaluate the deep venous system(s) from the level of the common femoral vein through the popliteal and proximal calf veins. COMPARISON:  None Available. FINDINGS: VENOUS Normal compressibility of the common femoral, superficial femoral, and popliteal veins, as well as the visualized calf veins. Visualized portions of profunda femoral vein and great saphenous vein unremarkable. No filling defects to suggest DVT on grayscale or color Doppler  imaging. Doppler waveforms show normal direction of venous flow, normal respiratory plasticity and response to augmentation. Limited views of the contralateral common femoral vein are unremarkable. OTHER None. Limitations: none IMPRESSION: Negative. Electronically Signed   By: Lynwood Landy Raddle M.D.   On: 06/28/2024 12:58    PROCEDURES:  Critical Care performed: No  Procedures   MEDICATIONS ORDERED IN ED: Medications  iohexol  (OMNIPAQUE ) 350 MG/ML injection 75 mL (75 mLs Intravenous Contrast Given 06/28/24 1505)     IMPRESSION / MDM / ASSESSMENT AND PLAN / ED COURSE  I reviewed the triage vital signs and the nursing notes.  Differential diagnosis includes, but is not limited to, DVT, PE, medication adverse effect, low suspicion CHF exacerbation given improving leg swelling on medication, recent reassuring echo  Patient's presentation is most consistent with acute presentation with potential threat to life or bodily function.  68 year old female presenting to the emergency department for evaluation of leg cramping and shortness of breath.  Stable vitals on presentation.  Reassuring CBC, CMP with mild AKI.  Negative initial troponin.  X-Evelyn Moch lower extremity ultrasound reassuring.  With history, will obtain CTA of the chest as well as second troponin with onset of symptoms this morning.  Signed out to oncoming physician at 1515 pending completion of workup and disposition.  If CTA and second troponin are reassuring, suspect patient will be stable for discharge.      FINAL CLINICAL IMPRESSION(S) / ED DIAGNOSES   Final diagnoses:  Nonspecific chest pain  Leg cramping     Rx / DC Orders   ED Discharge Orders     None        Note:  This document was prepared using Dragon voice recognition software and may include unintentional dictation errors.   Levander Slate, MD 06/28/24 [phone]

## 2024-06-28 NOTE — ED Provider Notes (Signed)
-----------------------------------------   5:27 PM on 06/28/2024 ----------------------------------------- Patient care assumed from Dr. Levander.  CTA of the chest is negative for PE.  Given the patient's reassuring workup reassuring labs I believe the patient is safe for discharge home with outpatient follow-up.  Patient agreeable to plan.   Dorothyann Drivers, MD 06/28/24 [phone]

## 2024-06-28 NOTE — ED Triage Notes (Signed)
 Patient to ED via POV for left sided CP. Started this AM. Also having left camp pain. Hx of CHF and blood clots.

## 2024-06-28 NOTE — ED Notes (Signed)
 This EDT assisted pt to bathroom. Pt was able to stand on their own and walk to the bathroom. After finished, I hooked the pt to the monitor.

## 2024-07-19 ENCOUNTER — Telehealth: Payer: Self-pay | Admitting: Family

## 2024-07-19 NOTE — Progress Notes (Unsigned)
 Advanced [facility] Failure Clinic Note   Referring Physician: EDP PCP: Krasovich, Janelle, MD Cardiologist: None   Chief Complaint: shortness of breath   HPI:  Courtney Murphy is a 68 y/o female with a history of obesity, PE, DM, HFpEF, lymphedema, right ankle fracture/ repair 05/25 and hypercoagulable due to antiphospholipid antibody syndrome on prophylactic Xarelto .    Was in the ED 05/14/24 due to leg pain and chest pain. Had increased lower extremity swelling, R>L, orthopnea, chest discomfort, abdominal swelling and was concerned explicitly about a DVT or PE. Positive D-dimer, negative venous ultrasound and CTA chest. Troponins and BNP low. Concern for possible HF so referral made to HF clinic.   Seen in [facility] 06/25 where jardiance  10mg  daily and 12.5mg  spironolactone  daily were started.   Echo 06/21/24: EF >55%, G1DD, normal RV  Seen in [facility] 07/25 where, after lab results obtained, spironolactone  was increased to 25mg  daily.   Was in the ED 06/28/24 with chest pain and leg edema. Ultrasound negative for DVT. CXR negative. Chest CTA negative for PE.   She presents today for a HF follow-up visit with a chief complaint of shortness of breath. Has associated fatigue, back pain, pedal edema (improving), decreased appetite and eating less snacks. She says that she was unable to tolerate 25mg  spironolactone  as it made her so sleepy that she was falling asleep at work. She decreased back down to 12.5mg  daily with improvement of sleepiness. Had lumbar fusion with rods 02/21 and has rods in her back. She says that she's having revision back surgery on 08/08/24.   Using compression boots twice weekly although she knows she should be using them 1-2 times/ day. Has not taken xarelto  in a long time. She understands that she should be on it but doesn't have a desire to resume at this time. Not adding salt but lives alone so eats out frequently and admits that she doesn't always make the best choices. Unclear as to  daily fluid intake. Denies tobacco, alcohol or drug use. 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   ROS: All systems negative except what is listed in HPI, PMH and Problem List  Past Medical History:  Diagnosis Date   Acid reflux 10/11/2015   Anemia    RECEIVES IRON  INFUSIONS   Arthritis    Asthma    WELL CONTROLLED   CHF (congestive heart failure) (HCC)    Depression    Diabetes mellitus without complication (HCC)    Fatty liver    Gout    History of kidney stones    History of methicillin resistant staphylococcus aureus (MRSA) 2016   Hypertension    H/O BEEN OFF BP MEDS FOR 1 YEAR   Hypothyroidism    Kidney stone    Knee pain    Left   Pulmonary emboli (HCC) 2011   Restless leg syndrome    Shoulder pain     Current Outpatient Medications  Medication Sig Dispense Refill   albuterol  (VENTOLIN  HFA) 108 (90 Base) MCG/ACT inhaler Inhale 2 puffs into the lungs every 6 (six) hours as needed.     atomoxetine  (STRATTERA ) 40 MG capsule      atorvastatin  (LIPITOR) 20 MG tablet Take 20 mg by mouth every morning.      cyanocobalamin  (,VITAMIN B-12,) 1000 MCG/ML injection Inject 1,000 mcg into the muscle once a week.     cyclobenzaprine  (FLEXERIL ) 10 MG tablet Take 10 mg by mouth every  8 (eight) hours.     doxepin (SINEQUAN) 10 MG capsule Take by mouth.     DULoxetine HCl 40 MG CPEP Take 1 capsule by mouth daily.     empagliflozin  (JARDIANCE ) 10 MG TABS tablet Take 10 mg by mouth daily.     fluticasone (FLONASE) 50 MCG/ACT nasal spray Place 2 sprays into both nostrils daily as needed for rhinitis or allergies.     furosemide  (LASIX ) 20 MG tablet Take 1 tablet (20 mg total) by mouth daily.     HYDROcodone -acetaminophen  (NORCO ) 10-325 MG tablet Take 1 tablet by mouth every 6 (six) hours as needed for severe pain (pain score 7-10).     JARDIANCE  10 MG TABS tablet Take 1 tablet (10 mg total) by mouth daily before breakfast. 30 tablet 5    levothyroxine  (SYNTHROID , LEVOTHROID) 100 MCG tablet Take 100 mcg by mouth daily before breakfast.     metFORMIN (GLUCOPHAGE-XR) 750 MG 24 hr tablet Take by mouth.     OZEMPIC, 2 MG/DOSE, 8 MG/3ML SOPN Inject 2 mg into the skin once a week.     pramipexole  (MIRAPEX ) 0.5 MG tablet Take 0.5-1 mg by mouth at bedtime.      pregabalin (LYRICA) 50 MG capsule Take 150 mg by mouth 2 (two) times daily.     spironolactone  (ALDACTONE ) 25 MG tablet Take 1 tablet (25 mg total) by mouth daily.     No current facility-administered medications for this visit.    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       Social History   Socioeconomic History   Marital status: Widowed    Spouse name: Not on file   Number of children: Not on file   Years of education: Not on file   Highest education level: Not on file  Occupational History   Not on file  Tobacco Use   Smoking status: Former    Current packs/day: 0.00    Average packs/day: 0.5 packs/day for 4.0 years (2.0 ttl pk-yrs)    Types: Cigarettes    Start date: 04/14/2011    Quit date: 04/14/2015    Years since quitting: 9.2   Smokeless tobacco: Never  Vaping Use   Vaping status: Never Used  Substance and Sexual Activity   Alcohol use: Not Currently    Comment: RARE   Drug use: No   Sexual activity: Never  Other Topics Concern   Not on file  Social History Narrative   Quit smoking > 30  years ago; rare alcohol. 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 Lives in [place]; with dog.    Social Drivers of Corporate investment banker Strain: Low Risk  (08/24/2023)   Received from [facility]   Overall Financial Resource Strain (CARDIA)    Difficulty of Paying Living Expenses: Not hard at all  Food Insecurity: No Food Insecurity (08/24/2023)   Received from [facility]  Care   Hunger Vital Sign    Within the past 12 months, you worried that your food would run out before you got the money to buy more.: Never true    Within the past 12 months, the food you bought just didn't last and you didn't have money to get more.: Never true  Transportation Needs: No Transportation Needs (08/24/2023)   Received from [facility] - Transportation    Lack of Transportation (Medical): No    Lack of Transportation (Non-Medical): No  Physical Activity: Sufficiently Active (08/24/2023)   Received from [facility]   Exercise Vital Sign    On average, how many days per week do you engage in moderate to strenuous exercise (like a brisk walk)?: 5 days    On average, how many minutes do you engage in exercise at this level?: 30 min  Stress: No Stress Concern Present (08/24/2023)   Received from [facility] - Occupational Stress Questionnaire    Feeling of Stress : Not at all  Social Connections: Moderately Isolated (08/24/2023)   Received from [facility]   Social Connection and Isolation Panel    In a typical week, how many times do you talk on the phone with family, friends, or neighbors?: More than three times a week    How often do you get together with friends or relatives?: More than three times a week    How often do you attend church or religious services?: Never    Do you belong to any clubs or organizations such as church groups, unions, fraternal or athletic groups, or school groups?:  Yes    How often do you attend meetings of the clubs or organizations you belong to?: More than 4 times per year    Are you married, widowed, divorced, separated, never married, or living with a partner?: Widowed  Intimate Partner Violence: Not At Risk (08/24/2023)   Received from [facility]   Humiliation, Afraid, Rape, and Kick questionnaire    Within the last year, have you been afraid of your partner or ex-partner?: No    Within the last year, have you been humiliated or emotionally abused in other ways by your partner or ex-partner?: No    Within the last year, have you been kicked, hit, slapped, or otherwise physically hurt by your partner or ex-partner?: No    Within the last year, have you been raped or forced to have any kind of sexual activity by your partner or ex-partner?: No      Family History  Problem Relation Age of Onset   Cancer Mother        breast   Heart disease Father    Diabetes Father    Hypertension Father    Cancer Maternal Grandmother    Cancer Maternal Grandfather    Heart disease Paternal Grandmother    Heart disease Paternal Grandfather    Cancer Maternal Aunt    Cancer Maternal Uncle    Heart disease Paternal Aunt    Heart disease Paternal Uncle    Kidney cancer Neg Hx    Bladder Cancer Neg Hx    Vitals:   07/20/24 1124  BP: 131/83  Pulse: 80  SpO2: 99%  Weight: 170 lb (77.1 kg)   Wt Readings from Last 3 Encounters:  07/20/24 170 lb (77.1 kg)  06/28/24 171 lb (77.6 kg)  06/21/24 173 lb (78.5 kg)  Lab Results  Component Value Date   CREATININE 1.28 (H) 07/20/2024   CREATININE 1.29 (H) 06/28/2024   CREATININE 0.91 06/21/2024   PHYSICAL EXAM:  General: Well appearing.  Cor: No JVD. Regular rhythm, rate.  Lungs: clear Abdomen: soft, nontender, nondistended. Extremities: 1+ pitting edema bilateral lower legs Neuro:. Affect pleasant   ECG: not done   Assessment & Plan:   1: Chronic heart failure with preserved ejection  fraction- - unclear etiology at this time - NYHA Murphy II - euvolemic - weight down 3 pounds from last visit here 1 month ago - Echo 07/09/22: EF >55%, G1DD - Echo 06/21/24: EF >55%, G1DD, normal RV - continue furosemide  20mg  daily - continue jardiance  10mg  daily - continue spironolactone  12.5mg  daily as the 25mg  dose made her too sleepy - reviewed daily sodium intake (2000mg ) and fluid intake (60-64oz) to follow - BNP 05/14/24 was 22.1 - BMP 06/28/24 reviewed: sodium 137, potassium 3.8, creatinine 1.29 & GFR 46 - BMET today - she defers sleep study workup until after back surgery is done. Will discuss at next visit  2: DM- - A1c 02/24/24 was 6.4% - sees PCP Courtney Murphy)  - continue metformin, ozempic  3: Lymphedema- - saw vascular ([facility]) 06/25 - using compression boots 1-2 x/ week instead of BID. Understands the importance of wearing these.   4: Hyperlipidemia- - continue atorvastatin  20mg  daily - LDL 08/19/23 was 56   Return in 2 months to see HF MD, sooner if needed.   Courtney DELENA Class, FNP 07/19/24

## 2024-07-19 NOTE — Telephone Encounter (Signed)
 Called to confirm/remind patient of their appointment at the [facility] on 07/20/24.   Appointment:   [x] Confirmed  [] Left mess   [] No answer/No voice mail  [] VM Full/unable to leave message  [] Phone not in service  Patient reminded to bring all medications and/or complete list.  Confirmed patient has transportation. Gave directions, instructed to utilize valet parking.

## 2024-07-20 ENCOUNTER — Other Ambulatory Visit
Admission: RE | Admit: 2024-07-20 | Discharge: 2024-07-20 | Disposition: A | Discharge status: Home / Self Care | Source: Ambulatory Visit | Attending: Family | Admitting: Family

## 2024-07-20 ENCOUNTER — Ambulatory Visit (HOSPITAL_BASED_OUTPATIENT_CLINIC_OR_DEPARTMENT_OTHER): Admitting: Family

## 2024-07-20 ENCOUNTER — Ambulatory Visit: Payer: Self-pay | Admitting: Family

## 2024-07-20 ENCOUNTER — Encounter: Payer: Self-pay | Admitting: Family

## 2024-07-20 VITALS — BP 131/83 | HR 80 | Wt 170.0 lb

## 2024-07-20 DIAGNOSIS — E119 Type 2 diabetes mellitus without complications: Secondary | ICD-10-CM | POA: Diagnosis not present

## 2024-07-20 DIAGNOSIS — I5032 Chronic diastolic (congestive) heart failure: Secondary | ICD-10-CM | POA: Diagnosis present

## 2024-07-20 DIAGNOSIS — E782 Mixed hyperlipidemia: Secondary | ICD-10-CM | POA: Diagnosis not present

## 2024-07-20 DIAGNOSIS — I890 Lymphedema, not elsewhere classified: Secondary | ICD-10-CM | POA: Diagnosis not present

## 2024-07-20 LAB — BASIC METABOLIC PANEL WITH GFR
Anion gap: 14 (ref 5–15)
BUN: 16 mg/dL (ref 8–23)
CO2: 26 mmol/L (ref 22–32)
Calcium: 9.7 mg/dL (ref 8.9–10.3)
Chloride: 101 mmol/L (ref 98–111)
Creatinine, Ser: 1.28 mg/dL — ABNORMAL HIGH (ref 0.44–1.00)
GFR, Estimated: 46 mL/min — ABNORMAL LOW (ref >60)
Glucose, Bld: 117 mg/dL — ABNORMAL HIGH (ref 70–99)
Potassium: 3.4 mmol/L — ABNORMAL LOW (ref 3.5–5.1)
Sodium: 141 mmol/L (ref 135–145)

## 2024-07-20 MED ORDER — POTASSIUM CHLORIDE CRYS ER 20 MEQ PO TBCR: 20.0000 meq | EXTENDED_RELEASE_TABLET | Freq: Every day | ORAL | 5 refills | Status: AC

## 2024-07-20 NOTE — Patient Instructions (Signed)
 Medication Changes:  No medication changes.   Lab Work:  Go over to the [facility]ICAL [facility]ALL. Go pass the gift shop and have your blood work completed.  We will only call you if the results are abnormal or if the provider would like to make medication changes.  No news is good news.   Follow-Up in: 2 months with one of our MDs. However, our Doctors' schedules are NOT open yet for 2 months. We will place you on our recall list. Once they are available, we will call you to schedule your follow up appointment.    Thank you for choosing [facility].    At the Advanced Heart Failure Clinic, you and your health needs are our priority. We have a designated team specialized in the treatment of Heart Failure. This Care Team includes your primary Heart Failure Specialized Cardiologist (physician), Advanced Practice Providers (APPs- Physician Assistants and Nurse Practitioners), and Pharmacist who all work together to provide you with the care you need, when you need it.   You may see any of the following providers on your designated Care Team at your next follow up:  Dr. Toribio Fuel Dr. Ezra Shuck Dr. Ria Commander Dr. Morene Brownie Ellouise Class, FNP Jaun Bash, RPH-CPP  Please be sure to bring in all your medications bottles to every appointment.   Need to Contact Us :  If you have any questions or concerns before your next appointment please send us  a message through [place] or call our office at [phone].    TO LEAVE A MESSAGE FOR THE NURSE SELECT OPTION 2, PLEASE LEAVE A MESSAGE INCLUDING: YOUR NAME DATE OF BIRTH CALL BACK NUMBER REASON FOR CALL**this is important as we prioritize the call backs  YOU WILL RECEIVE A CALL BACK THE SAME DAY AS LONG AS YOU CALL BEFORE 4:00 PM

## 2024-07-24 ENCOUNTER — Ambulatory Visit

## 2024-08-01 ENCOUNTER — Other Ambulatory Visit: Payer: Self-pay

## 2024-08-01 ENCOUNTER — Emergency Department
Admission: EM | Admit: 2024-08-01 | Discharge: 2024-08-02 | Disposition: A | Discharge status: Home / Self Care | Attending: Emergency Medicine | Admitting: Emergency Medicine

## 2024-08-01 ENCOUNTER — Emergency Department

## 2024-08-01 ENCOUNTER — Encounter: Payer: Self-pay | Admitting: *Deleted

## 2024-08-01 DIAGNOSIS — R600 Localized edema: Secondary | ICD-10-CM | POA: Insufficient documentation

## 2024-08-01 DIAGNOSIS — R079 Chest pain, unspecified: Secondary | ICD-10-CM | POA: Diagnosis not present

## 2024-08-01 DIAGNOSIS — M7989 Other specified soft tissue disorders: Secondary | ICD-10-CM | POA: Diagnosis present

## 2024-08-01 DIAGNOSIS — I509 Heart failure, unspecified: Secondary | ICD-10-CM | POA: Insufficient documentation

## 2024-08-01 LAB — CBC
HCT: 36.7 % (ref 36.0–46.0)
Hemoglobin: 12.1 g/dL (ref 12.0–15.0)
MCH: 30.1 pg (ref 26.0–34.0)
MCHC: 33 g/dL (ref 30.0–36.0)
MCV: 91.3 fL (ref 80.0–100.0)
Platelets: 237 K/uL (ref 150–400)
RBC: 4.02 MIL/uL (ref 3.87–5.11)
RDW: 13.7 % (ref 11.5–15.5)
WBC: 9 K/uL (ref 4.0–10.5)
nRBC: 0 % (ref 0.0–0.2)

## 2024-08-01 LAB — BASIC METABOLIC PANEL WITH GFR
Anion gap: 12 (ref 5–15)
BUN: 20 mg/dL (ref 8–23)
CO2: 28 mmol/L (ref 22–32)
Calcium: 9.1 mg/dL (ref 8.9–10.3)
Chloride: 99 mmol/L (ref 98–111)
Creatinine, Ser: 1.02 mg/dL — ABNORMAL HIGH (ref 0.44–1.00)
GFR, Estimated: 60 mL/min — ABNORMAL LOW (ref >60)
Glucose, Bld: 105 mg/dL — ABNORMAL HIGH (ref 70–99)
Potassium: 4 mmol/L (ref 3.5–5.1)
Sodium: 139 mmol/L (ref 135–145)

## 2024-08-01 LAB — TROPONIN I (HIGH SENSITIVITY)
Troponin I (High Sensitivity): 5 ng/L (ref <18)
Troponin I (High Sensitivity): 4 ng/L (ref <18)

## 2024-08-01 NOTE — ED Triage Notes (Signed)
 Pt to triage via wheelchair.  Pt reports pain and swelling in left leg for 2 days.  Pt now has chest pain that started this am.  No n/v/  no  sob.  Pt alert speech clear

## 2024-08-01 NOTE — ED Provider Notes (Signed)
 [facility] Provider Note   Event Date/Time   First MD Initiated Contact with Patient 08/01/24 2259     (approximate) History  Chest Pain and Leg Swelling  HPI Courtney Murphy is a 68 y.o. female with a stated past medical history of CHF, antiphospholipid antibody syndrome not on anticoagulation, and ACS who presents complaining of bilateral lower extremity edema in the right greater than the left leg however states that the left leg is more painful.  Patient also states she has chronic substernal 3/10 nonradiating chest pain that does not worsen with exertion.  Patient states that these legs have been swelling worse than usual over the past 3 days.  Patient states initially this swelling was improving until 3 days ago when it began worsening again.  Patient denies any increase fluid intake or change in her Lasix  dosing. ROS: Patient currently denies any vision changes, tinnitus, difficulty speaking, facial droop, sore throat, chest pain, shortness of breath, abdominal pain, nausea/vomiting/diarrhea, dysuria, or weakness/numbness/paresthesias in any extremity   Physical Exam  Triage Vital Signs: ED Triage Vitals  Encounter Vitals Group     BP 08/01/24 1918 118/68     Girls Systolic BP Percentile --      Girls Diastolic BP Percentile --      Boys Systolic BP Percentile --      Boys Diastolic BP Percentile --      Pulse Rate 08/01/24 1918 90     Resp 08/01/24 1918 18     Temp 08/01/24 1918 98.5 F (36.9 C)     Temp Source 08/01/24 1918 Oral     SpO2 08/01/24 1918 98 %     Weight 08/01/24 1915 170 lb (77.1 kg)     Height 08/01/24 1915 5' 4 (1.626 m)     Head Circumference --      Peak Flow --      Pain Score 08/01/24 1915 7     Pain Loc --      Pain Education --      Exclude from Growth Chart --    Most recent vital signs: Vitals:   08/01/24 2238 08/01/24 2300  BP: 129/83 110/70  Pulse: 76 72  Resp: 20 12  Temp:  98.3 F (36.8 C)  SpO2: 97% 94%    General: Awake, oriented x4. CV:  Good peripheral perfusion. Resp:  Normal effort. Abd:  No distention. Other:  Elderly overweight Caucasian female resting comfortably in no acute distress.  2+ bilateral lower extremity edema ED Results / Procedures / Treatments  Labs (all labs ordered are listed, but only abnormal results are displayed) Labs Reviewed  BASIC METABOLIC PANEL WITH GFR - Abnormal; Notable for the following components:      Result Value   Glucose, Bld 105 (*)    Creatinine, Ser 1.02 (*)    GFR, Estimated 60 (*)    All other components within normal limits  CBC  BRAIN NATRIURETIC PEPTIDE  TROPONIN I (HIGH SENSITIVITY)  TROPONIN I (HIGH SENSITIVITY)   EKG ED ECG REPORT I, Artist MARLA Kerns, the attending physician, personally viewed and interpreted this ECG. Date: 08/01/2024 EKG Time: 1919 Rate: 88 Rhythm: normal sinus rhythm QRS Axis: normal Intervals: normal ST/T Wave abnormalities: normal Narrative Interpretation: no evidence of acute ischemia RADIOLOGY ED MD interpretation: One-view portable chest x-ray interpreted by me shows no evidence of acute abnormalities including no pneumonia, pneumothorax, or widened mediastinum - All radiology independently interpreted and agree with radiology assessment Official  radiology report(s): US  Venous Img Lower Bilateral Result Date: 08/02/2024 CLINICAL DATA:  Bilateral leg swelling, antiphospholipid antibody syndrome EXAM: BILATERAL LOWER EXTREMITY VENOUS DOPPLER ULTRASOUND TECHNIQUE: Gray-scale sonography with compression, as well as color and duplex ultrasound, were performed to evaluate the deep venous system(s) from the level of the common femoral vein through the popliteal and proximal calf veins. COMPARISON:  None Available. FINDINGS: VENOUS Normal compressibility of the common femoral, superficial femoral, and popliteal veins, as well as the visualized calf veins. Visualized portions of profunda femoral vein and great  saphenous vein unremarkable. No filling defects to suggest DVT on grayscale or color Doppler imaging. Doppler waveforms show normal direction of venous flow, normal respiratory plasticity and response to augmentation. OTHER None. Limitations: none IMPRESSION: 1. Negative. Electronically Signed   By: Dorethia Molt M.D.   On: 08/02/2024 00:42   DG Chest 1 View Result Date: 08/01/2024 CLINICAL DATA:  Chest pain. EXAM: CHEST  1 VIEW COMPARISON:  Radiograph and CT 06/28/2024 FINDINGS: The cardiomediastinal contours are stable. Subsegmental atelectasis or scarring at the left lung base. Pulmonary vasculature is normal. No consolidation, pleural effusion, or pneumothorax. No acute osseous abnormalities are seen. IMPRESSION: No active disease. Electronically Signed   By: Andrea Gasman M.D.   On: 08/01/2024 20:46   PROCEDURES: Critical Care performed: No Procedures MEDICATIONS ORDERED IN ED: Medications - No data to display IMPRESSION / MDM / ASSESSMENT AND PLAN / ED COURSE  I reviewed the triage vital signs and the nursing notes.                             The patient is on the cardiac monitor to evaluate for evidence of arrhythmia and/or significant heart rate changes. Patient's presentation is most consistent with severe exacerbation of chronic illness. Patient 68 year old female with the above-stated past medical history that presents for bilateral lower extremity edema and chronic chest pain DDx: DVT, CHF exacerbation, ACS, PE Plan: CBC-WNL BMP-WNL BNP Troponin-negative x 2 CXR-no acute abnormalities EKG-nonischemic Clinical Course as of 08/02/24 0436  Wed Aug 02, 2024  0112 The patient has been reexamined and is ready to be discharged.  All diagnostic results have been reviewed and discussed with the patient.  Care plan has been outlined and the patient/family understands all current diagnoses, results, and treatment plans including increasing her Lasix  dosage from 20 mg to 40 mg over the  next 5 days as well as following up with her primary care physician.  There are no new complaints, changes, or physical findings at this time.  All questions have been addressed and answered.  Patient was instructed to, and agrees to follow-up with their primary care physician as well as return to the emergency department if any new or worsening symptoms develop. [EB]    Clinical Course User Index [EB] Jossie Artist POUR, MD   FINAL CLINICAL IMPRESSION(S) / ED DIAGNOSES   Final diagnoses:  Peripheral edema  Chest pain, unspecified type   Rx / DC Orders   ED Discharge Orders          Ordered    furosemide  (LASIX ) 20 MG tablet  Daily        08/02/24 0112           Note:  This document was prepared using Dragon voice recognition software and may include unintentional dictation errors.   Saher Davee K, MD 08/02/24 [phone]

## 2024-08-02 LAB — BRAIN NATRIURETIC PEPTIDE: B Natriuretic Peptide: 12 pg/mL (ref 0.0–100.0)

## 2024-08-02 MED ORDER — FUROSEMIDE 20 MG PO TABS: 40.0000 mg | ORAL_TABLET | Freq: Every day | ORAL | 0 refills | Status: DC

## 2024-08-02 NOTE — Discharge Instructions (Addendum)
 Please increase your Lasix  from 20 mg daily to 40 mg daily for the next 5 days.  Please limit your total fluid intake to 2 L/day

## 2024-08-18 ENCOUNTER — Emergency Department

## 2024-08-18 ENCOUNTER — Other Ambulatory Visit: Payer: Self-pay

## 2024-08-18 ENCOUNTER — Emergency Department: Admission: EM | Admit: 2024-08-18 | Discharge: 2024-08-18 | Disposition: A | Discharge status: Home / Self Care

## 2024-08-18 DIAGNOSIS — M79661 Pain in right lower leg: Secondary | ICD-10-CM | POA: Insufficient documentation

## 2024-08-18 DIAGNOSIS — I509 Heart failure, unspecified: Secondary | ICD-10-CM | POA: Diagnosis not present

## 2024-08-18 DIAGNOSIS — M25551 Pain in right hip: Secondary | ICD-10-CM | POA: Insufficient documentation

## 2024-08-18 DIAGNOSIS — I110 Hypertensive heart disease with heart failure: Secondary | ICD-10-CM | POA: Insufficient documentation

## 2024-08-18 DIAGNOSIS — E119 Type 2 diabetes mellitus without complications: Secondary | ICD-10-CM | POA: Insufficient documentation

## 2024-08-18 MED ORDER — CYCLOBENZAPRINE HCL 5 MG PO TABS: 5.0000 mg | ORAL_TABLET | Freq: Three times a day (TID) | ORAL | 0 refills | Status: AC | PRN

## 2024-08-18 MED ORDER — CYCLOBENZAPRINE HCL 10 MG PO TABS
5.0000 mg | ORAL_TABLET | Freq: Once | ORAL | Status: AC
Start: 2024-08-18 — End: 2024-08-18
  Administered 2024-08-18: 5 mg via ORAL
  Filled 2024-08-18: qty 1

## 2024-08-18 MED ORDER — OXYCODONE-ACETAMINOPHEN 5-325 MG PO TABS
1.0000 | ORAL_TABLET | Freq: Once | ORAL | Status: AC
  Administered 2024-08-18: 1 via ORAL
  Filled 2024-08-18 (×2): qty 1

## 2024-08-18 NOTE — Discharge Instructions (Signed)
 Your evaluated in the ED for right hip pain.  Your right hip x-ray is normal.  Your ultrasound of the right leg is normal.  Please follow-up with your primary care provider for further management.  If any new or worsening symptoms occur return to ED for further workup.

## 2024-08-18 NOTE — ED Provider Notes (Signed)
 [facility] Emergency Department Provider Note     Event Date/Time   First MD Initiated Contact with Patient 08/18/24 1359     (approximate)   History   Hip Pain   HPI  Courtney Murphy is a 68 y.o. female with a past medical history of restless leg syndrome, arthritis, anemia, gout, diabetes, HTN and CHF presents to the ED for evaluation of right hip pain x 3 days that is progressively worsening.  She denies injury.  Reports the pain travels down to her calf.  Patient reports recent spinal fusion procedure x 2 weeks ago. No loss of bladder or bowel control.  No saddle anesthesia.    Physical Exam   Triage Vital Signs: ED Triage Vitals  Encounter Vitals Group     BP 08/18/24 1306 120/70     Girls Systolic BP Percentile --      Girls Diastolic BP Percentile --      Boys Systolic BP Percentile --      Boys Diastolic BP Percentile --      Pulse Rate 08/18/24 1306 90     Resp 08/18/24 1306 17     Temp 08/18/24 1306 98 F (36.7 C)     Temp src --      SpO2 08/18/24 1306 100 %     Weight 08/18/24 1304 168 lb (76.2 kg)     Height 08/18/24 1304 5' 4 (1.626 m)     Head Circumference --      Peak Flow --      Pain Score 08/18/24 1304 8     Pain Loc --      Pain Education --      Exclude from Growth Chart --     Most recent vital signs: Vitals:   08/18/24 1306 08/18/24 1438  BP: 120/70   Pulse: 90   Resp: 17   Temp: 98 F (36.7 C)   SpO2: 100% 100%    General Awake, no distress.  HEENT NCAT.  CV:  Good peripheral perfusion.  RESP:  Normal effort.  ABD:  No distention.  Other:  Tenderness to palpation over right gluteal muscle.  Nontender to right calf.  Patient endorses swelling.  Pedal pulses palpated.   ED Results / Procedures / Treatments   Labs (all labs ordered are listed, but only abnormal results are displayed) Labs Reviewed - No data to display  RADIOLOGY  I personally viewed and evaluated these images as part of my  medical decision making, as well as reviewing the written report by the radiologist.  ED Provider Interpretation: Negative right hip x-ray.  Ultrasound normal.  US  Venous Img Lower Right (DVT Study) Result Date: 08/18/2024 CLINICAL DATA:  Right calf pain and edema. EXAM: RIGHT LOWER EXTREMITY VENOUS DOPPLER ULTRASOUND TECHNIQUE: Gray-scale sonography with graded compression, as well as color Doppler and duplex ultrasound were performed to evaluate the lower extremity deep venous systems from the level of the common femoral vein and including the common femoral, femoral, profunda femoral, popliteal and calf veins including the posterior tibial, peroneal and gastrocnemius veins when visible. The superficial great saphenous vein was also interrogated. Spectral Doppler was utilized to evaluate flow at rest and with distal augmentation maneuvers in the common femoral, femoral and popliteal veins. COMPARISON:  08/02/2024 FINDINGS: Contralateral Common Femoral Vein: Respiratory phasicity is normal and symmetric with the symptomatic side. No evidence of thrombus. Normal compressibility. Common Femoral Vein: No evidence of thrombus. Normal compressibility, respiratory phasicity  and response to augmentation. Saphenofemoral Junction: No evidence of thrombus. Normal compressibility and flow on color Doppler imaging. Profunda Femoral Vein: No evidence of thrombus. Normal compressibility and flow on color Doppler imaging. Femoral Vein: No evidence of thrombus. Normal compressibility, respiratory phasicity and response to augmentation. Popliteal Vein: No evidence of thrombus. Normal compressibility, respiratory phasicity and response to augmentation. Calf Veins: No evidence of thrombus. Normal compressibility and flow on color Doppler imaging. Superficial Great Saphenous Vein: No evidence of thrombus. Normal compressibility. Venous Reflux:  None. Other Findings: No evidence of superficial thrombophlebitis or abnormal fluid  collection. IMPRESSION: No evidence of right lower extremity deep venous thrombosis. Electronically Signed   By: Marcey Moan M.D.   On: 08/18/2024 16:31   DG Hip Unilat W or Wo Pelvis 2-3 Views Right Result Date: 08/18/2024 CLINICAL DATA:  Acute right hip pain not reported injury. EXAM: DG HIP (WITH OR WITHOUT PELVIS) 2-3V RIGHT COMPARISON:  None Available. FINDINGS: There is no evidence of hip fracture or dislocation. There is no evidence of arthropathy or other focal bone abnormality. IMPRESSION: Negative. Electronically Signed   By: Lynwood Landy Raddle M.D.   On: 08/18/2024 14:09    PROCEDURES:  Critical Care performed: No  Procedures   MEDICATIONS ORDERED IN ED: Medications  cyclobenzaprine  (FLEXERIL ) tablet 5 mg (5 mg Oral Given 08/18/24 1529)  oxyCODONE -acetaminophen  (PERCOCET/ROXICET) 5-325 MG per tablet 1 tablet (1 tablet Oral Given 08/18/24 1519)     IMPRESSION / MDM / ASSESSMENT AND PLAN / ED COURSE  I reviewed the triage vital signs and the nursing notes.                              Clinical Course as of 08/18/24 1805  Fri Aug 18, 2024  1639 DG Hip Unilat W or Wo Pelvis 2-3 Views Right [MH]  1800 US  Venous Img Lower Right (DVT Study) IMPRESSION: No evidence of right lower extremity deep venous thrombosis.   [MH]    Clinical Course User Index [MH] Margrette Rebbeca LABOR, PA-C    68 y.o. female presents to the emergency department for evaluation and treatment of Acute right hip pain with radiation to right calf. See HPI for further details.   Differential diagnosis includes, but is not limited to radiculopathy, fracture, DVT, strain  Patient's presentation is most consistent with acute complicated illness / injury requiring diagnostic workup.  Patient is alert and oriented.  She is hemodynamic stable.  Vital signs wnl.  Physical exam findings as stated above.  Hip x-ray and ultrasound of lower extremity reassuring.  ED pain management with a Percocet and Flexeril .  On  reassessment patient reports improvement in symptoms.  Review of the [place] CSRS was performed in accordance of the [facility] prior to dispensing any controlled drugs. Patient prescribed Oxycodone  3 days ago. Will send flexeril  to pharmacy.  The patient is in stable and satisfactory condition for discharge home. Encouraged to follow up with primary care provider for further management in 1 week. ED precautions discussed. All questions and concerns were addressed during this ED visit.    FINAL CLINICAL IMPRESSION(S) / ED DIAGNOSES   Final diagnoses:  Right hip pain  Right calf pain   Rx / DC Orders   ED Discharge Orders          Ordered    cyclobenzaprine  (FLEXERIL ) 5 MG tablet  3 times daily PRN        08/18/24 1645  Note:  This document was prepared using Dragon voice recognition software and may include unintentional dictation errors.    Margrette, Lainy Wrobleski A, PA-C 08/18/24 1807    Clarine Ozell LABOR, MD 08/18/24 2008

## 2024-08-18 NOTE — ED Triage Notes (Signed)
 Pt comes with right hip pain. Pt states this started few days ago and has now gotten worse. Pt denies any injuries. Pt states it radiates down to her calf.

## 2024-08-28 ENCOUNTER — Ambulatory Visit: Attending: Family | Admitting: Family

## 2024-08-28 ENCOUNTER — Encounter: Payer: Self-pay | Admitting: Family

## 2024-08-28 ENCOUNTER — Other Ambulatory Visit: Payer: Self-pay

## 2024-08-28 VITALS — BP 131/75 | HR 82 | Wt 175.5 lb

## 2024-08-28 DIAGNOSIS — Z86711 Personal history of pulmonary embolism: Secondary | ICD-10-CM | POA: Insufficient documentation

## 2024-08-28 DIAGNOSIS — Z8249 Family history of ischemic heart disease and other diseases of the circulatory system: Secondary | ICD-10-CM | POA: Diagnosis not present

## 2024-08-28 DIAGNOSIS — Z833 Family history of diabetes mellitus: Secondary | ICD-10-CM | POA: Insufficient documentation

## 2024-08-28 DIAGNOSIS — E782 Mixed hyperlipidemia: Secondary | ICD-10-CM

## 2024-08-28 DIAGNOSIS — M4317 Spondylolisthesis, lumbosacral region: Secondary | ICD-10-CM | POA: Diagnosis not present

## 2024-08-28 DIAGNOSIS — I5032 Chronic diastolic (congestive) heart failure: Secondary | ICD-10-CM | POA: Diagnosis not present

## 2024-08-28 DIAGNOSIS — M79661 Pain in right lower leg: Secondary | ICD-10-CM | POA: Insufficient documentation

## 2024-08-28 DIAGNOSIS — E119 Type 2 diabetes mellitus without complications: Secondary | ICD-10-CM | POA: Diagnosis not present

## 2024-08-28 DIAGNOSIS — I110 Hypertensive heart disease with heart failure: Secondary | ICD-10-CM | POA: Insufficient documentation

## 2024-08-28 DIAGNOSIS — Z7984 Long term (current) use of oral hypoglycemic drugs: Secondary | ICD-10-CM | POA: Insufficient documentation

## 2024-08-28 DIAGNOSIS — I890 Lymphedema, not elsewhere classified: Secondary | ICD-10-CM | POA: Diagnosis not present

## 2024-08-28 DIAGNOSIS — Z79899 Other long term (current) drug therapy: Secondary | ICD-10-CM | POA: Insufficient documentation

## 2024-08-28 DIAGNOSIS — Z5986 Financial insecurity: Secondary | ICD-10-CM | POA: Diagnosis not present

## 2024-08-28 DIAGNOSIS — Z7985 Long-term (current) use of injectable non-insulin antidiabetic drugs: Secondary | ICD-10-CM | POA: Diagnosis not present

## 2024-08-28 DIAGNOSIS — G479 Sleep disorder, unspecified: Secondary | ICD-10-CM | POA: Diagnosis not present

## 2024-08-28 DIAGNOSIS — E785 Hyperlipidemia, unspecified: Secondary | ICD-10-CM | POA: Diagnosis not present

## 2024-08-28 DIAGNOSIS — Z5941 Food insecurity: Secondary | ICD-10-CM | POA: Diagnosis not present

## 2024-08-28 DIAGNOSIS — M4316 Spondylolisthesis, lumbar region: Secondary | ICD-10-CM

## 2024-08-28 MED ORDER — TORSEMIDE 40 MG PO TABS: 40.0000 mg | ORAL_TABLET | Freq: Every day | ORAL | 5 refills | Status: AC

## 2024-08-28 NOTE — Progress Notes (Signed)
 Pt called asking to be put back on schedule. Advised pt that she also needs to reschedule her ehco. PT agreed. Placed new order, as we send our echoes [facility] and not [facility]. Pt also asked to be seen soon due to ankle and leg swelling. Made pt an appt for today 08/28/24 at 3:00 PM to see Ellouise Class, FNP. Echo order place. [facility] will schedule echo from queue.

## 2024-08-28 NOTE — Patient Instructions (Addendum)
 Medication Changes:  DISCONTINUE LASIX    START TORSEMIDE 40 MG ONCE DAILY   Lab Work:  Go over to the [facility]. Go pass the gift shop and have your blood work completed.  Go downstairs to [facility] on LOWER LEVEL to have your blood work completed.  We will only call you if the results are abnormal or if the provider would like to make medication changes.  No news is good news.   Testing/Procedures:  Please have your echo completed. You will check in for this in the [facility] at [facility] on the LOWER LEVEL. You have to arrive 15 MINS EARLY for preparation, otherwise you will have to reschedule. They will call you to get this scheduled. However, if you'd like to reach out to them instead, their number is [phone].    Follow-Up on: Friday, October 24th at 2:45 PM.   Thank you for choosing [facility].    At the Advanced [facility] Clinic, you and your health needs are our priority. We have a designated team specialized in the treatment of Heart Failure. This Care Team includes your primary Heart Failure Specialized Cardiologist (physician), Advanced Practice Providers (APPs- Physician Assistants and Nurse Practitioners), and Pharmacist who all work together to provide you with the care you need, when you need it.   You may see any of the following providers on your designated Care Team at your next follow up:  Dr. Toribio Fuel Dr. Ezra Shuck Dr. Ria Commander Dr. Morene Brownie Ellouise Class, FNP Jaun Bash, RPH-CPP  Please be sure to bring in all your medications bottles to every appointment.   Need to Contact Us :  If you have any questions or concerns before your next appointment please send us  a message through [place] or call our office at [phone].    TO LEAVE A MESSAGE FOR THE NURSE SELECT OPTION 2, PLEASE LEAVE A MESSAGE INCLUDING: YOUR NAME DATE OF BIRTH CALL BACK NUMBER REASON FOR  CALL**this is important as we prioritize the call backs  YOU WILL RECEIVE A CALL BACK THE SAME DAY AS LONG AS YOU CALL BEFORE 4:00 PM

## 2024-08-28 NOTE — Progress Notes (Signed)
 Advanced [facility] Note   Referring Physician: EDP PCP: Krasovich, Janelle, MD Cardiologist: None   Chief Complaint: pedal edema   HPI:  Ms Dejarnett is a 68 y/o female with a history of obesity, PE, DM, HFpEF, lymphedema, right ankle fracture/ repair 05/25 and hypercoagulable due to antiphospholipid antibody syndrome (was on prophylactic Xarelto  but patient preference to stop).    Was in the ED 05/14/24 due to leg pain and chest pain. Had increased lower extremity swelling, R>L, orthopnea, chest discomfort, abdominal swelling and was concerned explicitly about a DVT or PE. Positive D-dimer, negative venous ultrasound and CTA chest. Troponins and BNP low. Concern for possible HF so referral made to HF clinic.   Seen in [facility] 06/25 where jardiance  10mg  daily and 12.5mg  spironolactone  daily were started.   Echo 06/21/24: EF >55%, G1DD, normal RV  Seen in [facility] 07/25 where, after lab results obtained, spironolactone  was increased to 25mg  daily.   Was in the ED 06/28/24 with chest pain and leg edema. Ultrasound negative for DVT. CXR negative. Chest CTA negative for PE.   Was in the ED 08/01/24 with bilateral lower extremity edema in the right greater than the left leg however states that the left leg is more painful. Has chest pain along with this. Workup negative. Diuretic increased for the next 5 days.   Had TLIF L5-S1 with Dr. Dow on 08/08/2024.    Was in the ED 08/18/24 with right hip pain x 3 days that is progressively worsening. Spinal fusion procedure x 2 weeks ago. Ultrasound negative for DVT. Right hip xray was negative.   She presents today for a HF follow-up visit with a chief complaint of worsening pedal edema over the last few weeks. Has associated cough over the last week, fatigue, occasional chest pressure, sporadic dizziness, abdominal distention. Chronic difficulty sleeping. Pain on the lateral side of right lower leg. Says that her infection from her back procedure is  healing and she's finishing a course of antibiotics.   Using compression boots twice daily. Has compression socks but doesn't wear them daily. Has not taken xarelto  in a long time. She understands that she should be on it but doesn't have a desire to resume at this time. Not adding salt but lives alone so eats out frequently and admits that she doesn't always make the best choices. 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   ROS: All systems negative except what is listed in HPI, PMH and Problem List  Past Medical History:  Diagnosis Date   Acid reflux 10/11/2015   Anemia    RECEIVES IRON  INFUSIONS   Arthritis    Asthma    WELL CONTROLLED   CHF (congestive heart failure) (HCC)    Depression    Diabetes mellitus without complication (HCC)    Fatty liver    Gout    History of kidney stones    History of methicillin resistant staphylococcus aureus (MRSA) 2016   Hypertension    H/O BEEN OFF BP MEDS FOR 1 YEAR   Hypothyroidism    Kidney stone    Knee pain    Left   Pulmonary emboli (HCC) 2011   Restless leg syndrome    Shoulder pain     Current Outpatient Medications  Medication Sig Dispense Refill   albuterol  (VENTOLIN  HFA) 108 (90 Base) MCG/ACT inhaler Inhale 2 puffs into the lungs every 6 (six) hours as needed.  atomoxetine  (STRATTERA ) 40 MG capsule      atorvastatin  (LIPITOR) 20 MG tablet Take 20 mg by mouth every morning.      cyanocobalamin  (,VITAMIN B-12,) 1000 MCG/ML injection Inject 1,000 mcg into the muscle once a week.     cyclobenzaprine  (FLEXERIL ) 5 MG tablet Take 1 tablet (5 mg total) by mouth 3 (three) times daily as needed. 30 tablet 0   doxepin (SINEQUAN) 10 MG capsule Take by mouth.     DULoxetine HCl 40 MG CPEP Take 1 capsule by mouth daily.     empagliflozin  (JARDIANCE ) 10 MG TABS tablet Take 10 mg by mouth daily.     fluticasone (FLONASE) 50 MCG/ACT nasal spray Place 2 sprays into both nostrils daily as  needed for rhinitis or allergies.     furosemide  (LASIX ) 20 MG tablet Take 1 tablet (20 mg total) by mouth daily.     furosemide  (LASIX ) 20 MG tablet Take 2 tablets (40 mg total) by mouth daily for 5 days. 10 tablet 0   HYDROcodone -acetaminophen  (NORCO ) 10-325 MG tablet Take 1 tablet by mouth every 6 (six) hours as needed for severe pain (pain score 7-10).     JARDIANCE  10 MG TABS tablet Take 1 tablet (10 mg total) by mouth daily before breakfast. 30 tablet 5   levothyroxine  (SYNTHROID , LEVOTHROID) 100 MCG tablet Take 100 mcg by mouth daily before breakfast.     metFORMIN (GLUCOPHAGE-XR) 750 MG 24 hr tablet Take by mouth.     OZEMPIC, 2 MG/DOSE, 8 MG/3ML SOPN Inject 2 mg into the skin once a week.     potassium chloride  SA (KLOR-CON  M) 20 MEQ tablet Take 1 tablet (20 mEq total) by mouth daily. 30 tablet 5   pramipexole  (MIRAPEX ) 0.5 MG tablet Take 0.5-1 mg by mouth at bedtime.      pregabalin (LYRICA) 50 MG capsule Take 150 mg by mouth 2 (two) times daily.     spironolactone  (ALDACTONE ) 25 MG tablet Take 12.5 mg by mouth daily.     No current facility-administered medications for this visit.    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       Social History   Socioeconomic History   Marital status: Widowed    Spouse name: Not on file   Number of children: Not on file   Years of education: Not on file   Highest education level: Not on  file  Occupational History   Not on file  Tobacco Use   Smoking status: Former    Current packs/day: 0.00    Average packs/day: 0.5 packs/day for 4.0 years (2.0 ttl pk-yrs)    Types: Cigarettes    Start date: 04/14/2011    Quit date: 04/14/2015    Years since quitting: 9.3   Smokeless tobacco: Never  Vaping Use   Vaping status: Never Used  Substance and Sexual Activity   Alcohol use: Not Currently    Comment: RARE  Drug use: No   Sexual activity: Never  Other Topics Concern   Not on file  Social History Narrative   Quit smoking > 30 years ago; rare alcohol. 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 Lives in [place]; with dog.    Social Drivers of Health   Financial Resource Strain: Medium Risk (08/16/2024)   Received from [facility]   Overall Financial Resource Strain (CARDIA)    How hard is it for you to pay for the very basics like food, housing, medical care, and heating?: Somewhat hard  Food Insecurity: Food Insecurity Present (08/16/2024)   Received from [facility]   Hunger Vital Sign    Within the past 12 months, you worried that your food would run out before you got the money to buy more.: Sometimes true    Within the past 12 months, the food you bought just didn't last and you didn't have money to get more.: Never true  Transportation Needs: No Transportation Needs (08/16/2024)   Received from [facility]   PRAPARE - Transportation    Lack of Transportation (Medical): No    Lack of Transportation (Non-Medical): No  Physical Activity: Sufficiently Active (08/24/2023)   Received from [facility]   Exercise Vital Sign    On average, how many days per week do you engage in moderate to strenuous exercise (like a brisk walk)?: 5 days    On average, how many minutes do you engage in exercise at this level?: 30 min  Stress: No Stress Concern Present (08/24/2023)   Received from [facility]  [facility] - Occupational Stress Questionnaire    Feeling of Stress : Not at all  Social Connections: Moderately Isolated (08/24/2023)   Received from [facility]   Social Connection and Isolation Panel    In a typical week, how many times do you talk on the phone with family, friends, or neighbors?: More than three times a week    How often do you get together with friends or relatives?: More than three times a week    How often do you attend church or religious services?: Never    Do you belong to any clubs or organizations such as church groups, unions, fraternal or athletic groups, or school groups?: Yes    How often do you attend meetings of the clubs or organizations you belong to?: More than 4 times per year    Are you married, widowed, divorced, separated, never married, or living with a partner?: Widowed  Intimate Partner Violence: Not At Risk (08/24/2023)   Received from [facility]   Humiliation, Afraid, Rape, and Kick questionnaire    Within the last year, have you been afraid of your partner or ex-partner?: No    Within the last year, have you been humiliated or emotionally abused in other ways by your partner or ex-partner?: No    Within the last year, have you been kicked, hit, slapped, or otherwise physically hurt by your partner or ex-partner?: No    Within the last year, have you been raped or forced to have any kind of sexual activity by your partner or ex-partner?: No      Family History  Problem Relation Age of Onset   Cancer Mother        breast   Heart disease Father    Diabetes Father    Hypertension Father    Cancer  Maternal Grandmother    Cancer Maternal Grandfather    Heart disease Paternal Grandmother    Heart disease Paternal Grandfather    Cancer Maternal Aunt    Cancer Maternal Uncle    Heart disease Paternal Aunt    Heart disease Paternal Uncle    Kidney cancer Neg Hx    Bladder Cancer Neg Hx    Vitals:   08/28/24 1456  BP:  131/75  Pulse: 82  SpO2: 99%  Weight: 175 lb 8 oz (79.6 kg)   Wt Readings from Last 3 Encounters:  08/28/24 175 lb 8 oz (79.6 kg)  08/18/24 168 lb (76.2 kg)  08/01/24 170 lb (77.1 kg)   Lab Results  Component Value Date   CREATININE 1.02 (H) 08/01/2024   CREATININE 1.28 (H) 07/20/2024   CREATININE 1.29 (H) 06/28/2024    PHYSICAL EXAM:  General: Well appearing.  Cor: No JVD. Regular rhythm, rate.  Lungs: clear Abdomen: soft, nontender, distended. Extremities: 1+ pitting edema Neuro:. Affect pleasant    ECG: not done   Assessment & Plan:   1: Chronic heart failure with preserved ejection fraction- - unclear etiology at this time - NYHA class II - euvolemic - weight up 5 pounds from last visit here 1 month ago - Echo 07/09/22: EF >55%, G1DD - Echo 06/21/24: EF >55%, G1DD, normal RV - stop furosemide  and begin torsemide 40mg  daily. Can decrease back to 20mg  once edema improves.  - continue jardiance  10mg  daily - continue spironolactone  12.5mg  daily as the 25mg  dose made her too sleepy - reviewed daily sodium intake (2000mg ) and fluid intake (60-64oz) to follow - BNP 08/01/24 was 12.0 - BMP 08/01/24 reviewed: sodium 139, potassium 4.0, creatinine 1.02 & GFR 60. BMET today - she defers sleep study workup at this time - emphasized wearing compression socks daily - BP 131/75  2: DM- - A1c 02/24/24 was 6.4% - sees PCP Adrianne)  - continue metformin, ozempic  3: Lymphedema- - saw vascular Sherrian) 06/25 - using compression boots twice daily   4: Hyperlipidemia- - continue atorvastatin  20mg  daily - LDL 08/19/23 was 56 - lipid panel today  5: L5-S1 spondylolisthesis- - TLIF L5-S1 with Dr. Dow on 08/08/2024.  - she reports that she's completing antibiotics for infection   Return in 1 month to see HF MD as she has no other cardiology care, sooner if needed.   I spent 37 minutes reviewing records, interviewing/ examing patient and managing plan/ orders.    Ellouise DELENA Class, FNP 08/28/24

## 2024-08-29 ENCOUNTER — Ambulatory Visit: Payer: Self-pay | Admitting: Family

## 2024-08-29 LAB — LIPID PANEL
Chol/HDL Ratio: 4.3 ratio (ref 0.0–4.4)
Cholesterol, Total: 125 mg/dL (ref 100–199)
HDL: 29 mg/dL — ABNORMAL LOW (ref >39)
LDL Chol Calc (NIH): 56 mg/dL (ref 0–99)
Triglycerides: 247 mg/dL — ABNORMAL HIGH (ref 0–149)
VLDL Cholesterol Cal: 40 mg/dL (ref 5–40)

## 2024-08-29 LAB — BASIC METABOLIC PANEL WITH GFR
BUN/Creatinine Ratio: 19 (ref 12–28)
BUN: 18 mg/dL (ref 8–27)
CO2: 27 mmol/L (ref 20–29)
Calcium: 9.5 mg/dL (ref 8.7–10.3)
Chloride: 95 mmol/L — ABNORMAL LOW (ref 96–106)
Creatinine, Ser: 0.94 mg/dL (ref 0.57–1.00)
Glucose: 93 mg/dL (ref 70–99)
Potassium: 3.7 mmol/L (ref 3.5–5.2)
Sodium: 141 mmol/L (ref 134–144)
eGFR: 66 mL/min/1.73 (ref >59)

## 2024-08-29 MED ORDER — ATORVASTATIN CALCIUM 40 MG PO TABS: 40.0000 mg | ORAL_TABLET | Freq: Every day | ORAL | 1 refills | Status: AC

## 2024-09-18 ENCOUNTER — Emergency Department: Admission: EM | Admit: 2024-09-18 | Discharge: 2024-09-18 | Disposition: A | Discharge status: Home / Self Care

## 2024-09-18 ENCOUNTER — Emergency Department

## 2024-09-18 ENCOUNTER — Other Ambulatory Visit: Payer: Self-pay

## 2024-09-18 ENCOUNTER — Telehealth: Payer: Self-pay | Admitting: Internal Medicine

## 2024-09-18 DIAGNOSIS — W19XXXA Unspecified fall, initial encounter: Secondary | ICD-10-CM

## 2024-09-18 DIAGNOSIS — W1812XA Fall from or off toilet with subsequent striking against object, initial encounter: Secondary | ICD-10-CM | POA: Insufficient documentation

## 2024-09-18 DIAGNOSIS — Y92002 Bathroom of unspecified non-institutional (private) residence single-family (private) house as the place of occurrence of the external cause: Secondary | ICD-10-CM | POA: Insufficient documentation

## 2024-09-18 DIAGNOSIS — I509 Heart failure, unspecified: Secondary | ICD-10-CM | POA: Diagnosis not present

## 2024-09-18 DIAGNOSIS — M542 Cervicalgia: Secondary | ICD-10-CM | POA: Insufficient documentation

## 2024-09-18 DIAGNOSIS — S0990XA Unspecified injury of head, initial encounter: Secondary | ICD-10-CM | POA: Insufficient documentation

## 2024-09-18 LAB — CBG MONITORING, ED: Glucose-Capillary: 106 mg/dL — ABNORMAL HIGH (ref 70–99)

## 2024-09-18 NOTE — ED Triage Notes (Signed)
 Patient brought in by EMS from home for fall from toilet with impact to R forehead. Patient reports falling asleep on toilet and then falling, denies syncope. No LOC on impact, no blood thinners. AxOx4, ambulatory with cane on scene with EMS. No meds given by EMS.

## 2024-09-18 NOTE — Discharge Instructions (Signed)

## 2024-09-18 NOTE — ED Provider Notes (Signed)
 [facility] Provider Note    Event Date/Time   First MD Initiated Contact with Patient 09/18/24 [phone]     (approximate)   History   Fall and Head Injury   HPI  Courtney Murphy is a 68 y.o. female with a past medical history of CHF, antiphospholipid syndrome, distant history of PE and ACS who presents after a fall from sitting.  Patient was in her normal state of health and was sitting on a toilet using the bathroom when she fell asleep.  She awoke when she was falling forward and hit her head.  Denies any loss of consciousness.  She called EMS when she noticed that she had increasing swelling in the forehead.  She does have left lateral neck pain since the incident.  She is not on any blood thinners.  Denies any hearing or vision changes.  She was ambulatory at the scene per EMS.  She denies any back pain chest pain shortness of breath abdominal pain or changes in urinary or bowel habits.      Physical Exam   Triage Vital Signs: ED Triage Vitals  Encounter Vitals Group     BP 09/18/24 0258 118/72     Girls Systolic BP Percentile --      Girls Diastolic BP Percentile --      Boys Systolic BP Percentile --      Boys Diastolic BP Percentile --      Pulse Rate 09/18/24 0258 79     Resp 09/18/24 0258 18     Temp 09/18/24 0258 97.9 F (36.6 C)     Temp Source 09/18/24 0258 Oral     SpO2 09/18/24 0258 96 %     Weight 09/18/24 0304 170 lb (77.1 kg)     Height 09/18/24 0304 5' 4 (1.626 m)     Head Circumference --      Peak Flow --      Pain Score 09/18/24 0304 7     Pain Loc --      Pain Education --      Exclude from Growth Chart --     Most recent vital signs: Vitals:   09/18/24 0427 09/18/24 0546  BP: 111/65 126/71  Pulse: 78 72  Resp: 16 16  Temp:    SpO2: 97% 98%    Nursing Triage Note reviewed. Vital signs reviewed and patients oxygen saturation is normoxic  General: Patient is well nourished, well developed, awake and alert, resting  comfortably in no acute distress Head: Normocephalic, large right-sided forehead hematoma Eyes: Normal inspection, extraocular muscles intact, no conjunctival pallor Ear, nose, throat: Normal external exam Neck: Normal range of motion No C-spine tenderness to palpation but does have some tenderness to palpation over the left sternocleidomastoid Respiratory: Patient is in no respiratory distress, lungs CTAB Cardiovascular: Patient is not tachycardic, RRR without murmur appreciated GI: Abd SNT with no guarding or rebound  Back: Normal inspection of the back with good strength and range of motion throughout all ext Extremities: pulses intact with good cap refills, no LE pitting edema or calf tenderness Neuro: The patient is alert and oriented to person, place, and time, appropriately conversive, with 5/5 bilat UE/LE strength, no gross motor or sensory defects noted. Coordination appears to be adequate. Skin: Warm, dry, and intact Psych: normal mood and affect, no SI or HI  ED Results / Procedures / Treatments   Labs (all labs ordered are listed, but only abnormal results are displayed) Labs  Reviewed  CBG MONITORING, ED - Abnormal; Notable for the following components:      Result Value   Glucose-Capillary 106 (*)    All other components within normal limits     EKG   RADIOLOGY EKG and rhythm strip are interpreted by myself:   EKG: [Normal sinus rhythm] at heart rate of 77, normal QRS duration, QTc 488, nonspecific ST segments and T waves no ectopy EKG not consistent with Acute STEMI Rhythm strip: NSR in lead II     PROCEDURES:  Critical Care performed: No  Procedures   MEDICATIONS ORDERED IN ED: Medications - No data to display   IMPRESSION / MDM / ASSESSMENT AND PLAN / ED COURSE                                Differential diagnosis includes, but is not limited to, intracranial hemorrhage, head contusion, cervical spine fracture, arrhythmia, hypoglycemia  ED  course: Patient presents and she has no focal neurological deficits and is satting well on room air.  I did obtain an EKG on arrival as although patient is a good historian and reports falling asleep, she definitely has risk factors for syncope as well.  Agree the EKG demonstrated no evidence of arrhythmia or evidence of right heart strain and is unchanged from previous.  Point-of-care glucose was not low.  CT head demonstrated no intracranial hemorrhage and CT C-spine demonstrated no fracture.  Patient was able to ambulate around the unit and felt comfortable returning home.  All questions answered and patient voiced understanding and will return if any acutely worsening symptoms   Clinical Course as of 09/18/24 0755  Mon Sep 18, 2024  0457 Glucose-Capillary(!): 106 Unremarkable [HD]  0458 CT Head Wo Contrast No intracranial hemorrhage [HD]  0458 CT Cervical Spine Wo Contrast No cervical spine fracture [HD]    Clinical Course User Index [HD] Nicholaus Rolland BRAVO, MD   At time of discharge there is no evidence of acute life, limb, vision, or fertility threat. Patient has stable vital signs, pain is well controlled, patient is ambulatory and p.o. tolerant.  Discharge instructions were completed using the EPIC system. I would refer you to those at this time. All warnings prescriptions follow-up etc. were discussed in detail with the patient. Patient indicates understanding and is agreeable with this plan. All questions answered.  Patient is made aware that they may return to the emergency department for any worsening or new condition or for any other emergency.  -- Risk: 5 This patient has a high risk of morbidity due to further diagnostic testing or treatment. Rationale: This patient's evaluation and management involve a high risk of morbidity due to the potential severity of presenting symptoms, need for diagnostic testing, and/or initiation of treatment that may require close monitoring. The  differential includes conditions with potential for significant deterioration or requiring escalation of care. Treatment decisions in the ED, including medication administration, procedural interventions, or disposition planning, reflect this level of risk. COPA: 5 The patient has the following acute or chronic illness/injury that poses a possible threat to life or bodily function: [X] : The patient has a potentially serious acute condition or an acute exacerbation of a chronic illness requiring urgent evaluation and management in the Emergency Department. The clinical presentation necessitates immediate consideration of life-threatening or function-threatening diagnoses, even if they are ultimately ruled out.   FINAL CLINICAL IMPRESSION(S) / ED DIAGNOSES   Final diagnoses:  Injury of head, initial encounter  Fall in home, initial encounter     Rx / DC Orders   ED Discharge Orders     None        Note:  This document was prepared using Dragon voice recognition software and may include unintentional dictation errors.   Nicholaus Rolland BRAVO, MD 09/18/24 [phone]

## 2024-09-21 NOTE — Progress Notes (Signed)
 [facility] INTERNAL MEDICINE [address] [place] [address] Loc: [phone]  Name:  Courtney Murphy DOB: 06-15-1956 Today's Date: 09/22/2024  Age:  68 y.o. Sex: female Assessment/Plan:    1. Routine general medical examination at a health care facility   2. Type 2 diabetes mellitus with diabetic polyneuropathy, without long-term current use of insulin  (CMS-HCC)   3. Hypothyroidism, unspecified type   4. Status post gastric bypass for obesity   5. Iron  deficiency   6. B12 deficiency   7. Essential hypertension, benign   8. Insomnia, unspecified type   9. Chronic heart failure with preserved ejection fraction (CMS-HCC)     Routine general medical examination at a health care facility Healthy exam. Reviewed pre-visit labs. --Lipids: The 10-year ASCVD risk score (Arnett DK, et al., 2019) is: 21.5%; on statin --Colorectal Cancer Screening: colonoscopy 2020; due, pt declined --Mammogram: due, pt declined --DEXA: 2021 with mild osteopenia; repeat 2026 --Pap smear/HPV: negative pap smear 2024, s/p hysterectomy 2004 --vaccines:             Shingrix completed             Pneumococcal: UTD PCV-20 2022  RSV completed             COVID/flu reviewed; flu given today  Tdap due 2034 Lifestyle issues discussed (including diet/sleep hygiene/exercise/stress management/sexual health/substance use as appropriate). Labs per orders.  Type 2 diabetes mellitus with diabetic polyneuropathy, without long-term current use of insulin     (CMS-HCC) Lab Results  Component Value Date   A1C 6.4 (H) 02/24/2024  On metformin XR 1500 mg daily, Jardiance  10 mg, and Ozempic 2 mg qWeek. Needs updated A1c today.  Diabetic HM: --HTN: addressed below --urine albumin/cr: UTD 01/2024 --lipids: on statin --foot exam: completed by podiatry --eye exam: UTD 08/2024 --pneumococcal: PCV-20 2022 --Update A1c today.  B12 deficiency Vitamin B12 deficiency s/p gastric bypass. On B12  injections qMonth  --Continue current therapy. Will update B12 labs.  Insomnia Sleep maintenance insomnia. Failed doxepin 10 mg. H/o RLS which she feels is overall not contributing --Will trial her on 2 capsules of doxepin; increase to 25 mg if working well. Consider Sonata if no improvement. -- continue iron  infusions thru heme  Hypothyroidism On levothyroxine  100 mcg daily. --Update TSH today. Continue current therapy.  Essential hypertension, benign Not optimally controlled on spironolactone  and torsemide daily, however likely elevated due to pain. Followed by cardiology in background. --Consider adding back ACE-I, however will defer to cardiology given her obvious discomfort today.  Status post gastric bypass for obesity S/p gastric bypass, weight overall stable. --Update bariatric labs today.  Chronic heart failure with preserved ejection fraction    (CMS-HCC) F/b cardiology, on spironlacton/jardiance , echo scheduled    The following preventive services were advised: As reviewed above Importance of regular aerobic & resistance exercise reviewed, aiming for 30 minutes daily.   Orders Placed This Encounter  Procedures  . HM DIABETES FOOT EXAM  . INFLUENZA ADJUVANTED PF, IIV3(63YR UP)(FLUAD)  . CBC w/ Differential  . Comprehensive Metabolic Panel  . Hemoglobin A1c  . Vitamin B12 Level  . Vitamin B2  . Vitamin D 25 Hydroxy (25OH D2 + D3)  . Vitamin E  . Zinc Level, Serum  . Copper, Serum   Requested Prescriptions   Signed Prescriptions Disp Refills  . azelastine (ASTELIN) 137 mcg (0.1 %) nasal spray 30 mL 11    Sig: 1 spray into each nostril two (2) times a  day. Use in each nostril as directed  . doxepin (SINEQUAN) 10 MG capsule 60 capsule 0    Sig: Take 2 capsules (20 mg total) by mouth nightly.   Medications Discontinued During This Encounter  Medication Reason  . azelastine (ASTELIN) 137 mcg (0.1 %) nasal spray Reorder  . doxepin (SINEQUAN) 10 MG capsule  Patient Reported Unsatisfactory Result  . furosemide  (LASIX ) 20 MG tablet No Longer Taking    Return in about 4 months (around 01/23/2025) for diabetes follow up, non fasting labs prior, a1c.   Subjective:    Chief Complaint  Patient presents with  . Annual Exam    MWV. Fell off toilet recently.     Patient ID: Courtney Murphy is a 68 y.o. female here today for an annual physical.  history of T2DM with peripheral neuropathy, HTN, CHF, s/p gastric bypass, RLS, hypothyroidism, HLD, iron  and B12 deficiency, gout, APLAb syndrome (fb heme)   HPI Reports generally doing well. 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 Recently s/p spine surgery and has been struggling with postoperative pain. Admits not exercising.  T2DM Doing well on metformin XR 1500 mg daily, Jardiance  10 mg, Ozempic 2 mg qWeek. Monitors glucose with finger-stick glucometer here and there and reports levels have been well controlled. Sees podiatry and gets retinal eye exams regularly.  HTN/CHF Followed by [facility] cardiology. Currently on spironolactone  and torsemide. BP Readings from Last 3 Encounters:  09/22/24 146/76  05/25/24 138/80  10/21/23 116/68   Insomnia Difficulty staying asleep at night. Usually wakes up at 1 am and cannot get back to sleep until 4 am. Failed doxepin 10 mg for lack of improvement. Does not think her RLS is what is waking her up at night.    Physical Activity: Inactive (09/20/2024)   Exercise Vital Sign   . Days of Exercise per Week: 0 days   . Minutes of Exercise per Session: 0 min   Tobacco Use: Medium Risk (09/22/2024)   Patient History   . Smoking Tobacco Use: Former   . Smokeless Tobacco Use: Former   . Passive Exposure: Not on file   Alcohol Use: Not At Risk (09/20/2024)   Alcohol Use   . How often do you have a drink containing alcohol?: Never   . How many drinks containing alcohol do you have on a typical day when you are drinking?: 1 - 2   . How often do you  have 5 or more drinks on one occasion?: Never    Social History   Social History Narrative   Does not exercise   Works full time at preschool           ROS A comprehensive 10+ review of systems was negative unless otherwise stated in the HPI.   Updated History:  As part of today's comprehensive wellness visit, I have reviewed and updated the following portions of the patient's history in the electronic record: allergies, current medications, past medical history, past surgical history, past family history, past social history, and active problem list.  Medications: Medications Prior to Visit[1]       Objective:   BP 146/76 (BP Site: L Arm, BP Position: Sitting, BP Cuff Size: Large)   Pulse 96   Temp 36.6 C (97.9 F) (Temporal)   Wt 74.3 kg (163 lb 12.8 oz)   SpO2 98%   BMI 28.12 kg/m   Physical Exam General: Alert, cooperative, no distress, appears stated age.  EYES: PERRLA, Conjuntiva clear.  ENT: Bilateral TMs normal;  NECK: Supple w/o adenopathy, thyromegaly or masses LYMPH: No cervical, supraclavicular, axillary, or inguinal lymphadenopathy. CV: Regular rate and rhythm. No murmurs. No carotid bruits. Peripheral pulses normal. Midline swelling. RESP:  Respiratory effort unremarkable. Lungs clear to auscultation bilaterally, No wheezes or rhonchi.  GI: Abdomen soft and non-tender. No organomegaly or masses. SKIN: Warm and dry. No rashes.  MSK: Prominent right sternoclavicular joint. NEURO: Alert and oriented to place and time. CN II-XII grossly intact. Gait and station unremarkable.  PSYC: Mood and affect appropriate for situation.  BREAST: Symmetrical. No masses or lumps. No nipple discharge.  No abnormal axillary nodes.  Reduction scars noted  Foot exam:  sees podiatry, cursory exam today with lymphemedema in ankles, skin intact, h/o neuropathy wihtout noew lesions or progression of sensory deficit into ankles/legs , DP/PT puslese 2+  symmetric  _______________________________________________________________________ Medication adherence and barriers to the treatment plan have been addressed. Opportunities to optimize healthy behaviors have been discussed. Patient / caregiver voiced understanding.  I attest that I, Geryl Rase, personally documented this note while acting as scribe for Hart LITTIE Kick, MD.    Geryl Rase, Scribe. 09/22/2024   The documentation recorded by the scribe accurately reflects the service I personally performed and the decisions made by me.  Janelle L Krasovich, MD         [1] Outpatient Medications Prior to Visit  Medication Sig Dispense Refill  . albuterol  HFA 90 mcg/actuation inhaler INHALE 2 PUFFS BY MOUTH EVERY 6 HOURS AS NEEDED FOR WHEEZING FOR SHORTNESS OF BREATH 18 g 11  . atomoxetine  (STRATTERA ) 40 MG capsule Take 2 capsules by mouth daily. 60 capsule 4  . atorvastatin  (LIPITOR) 20 MG tablet Take 1 tablet by mouth once daily. 90 tablet 3  . BD INTEGRA SYRINGE 3 mL 25 gauge x 5/8 Syrg Use to inject vitamin b12 once a week. 10 each 2  . blood-glucose meter kit Use as instructed twice daily Please also provide appropriate control solution, lancets and strips for 3 motnth suply 1 each 0  . cyanocobalamin , vitamin B-12, 1,000 mcg/mL injection Inject 1 ml into the muscle every 30 days. 10 mL 3  . cyclobenzaprine  (FLEXERIL ) 10 MG tablet Take 1 tablet (10 mg total) by mouth every eight (8) hours.    . DULoxetine (CYMBALTA) 60 MG capsule Take 1 capsule by mouth once daily. 90 capsule 1  . HYDROcodone -acetaminophen  (NORCO ) 5-325 mg per tablet Take 1 tablet by mouth every six (6) hours as needed for pain.    . JARDIANCE  10 mg tablet Take 1 tablet (10 mg total) by mouth daily.    . levothyroxine  (SYNTHROID ) 100 MCG tablet Take 1 tablet by mouth daily. 90 tablet 3  . metFORMIN (GLUCOPHAGE-XR) 750 MG 24 hr tablet Take 2 tablets (1,500 mg total) by mouth daily before breakfast. 180 tablet  1  . ONETOUCH ULTRA TEST Strp USE TO TEST BLOOD SUGAR TWICE DAILY 100 each 1  . pen needle, diabetic 31 gauge x 5/16 (8 mm) Ndle Qd-tid as directed 90 each 3  . pramipexole  (MIRAPEX ) 0.5 MG tablet Take 2 tablets by mouth at bedtime. 180 tablet 3  . pregabalin (LYRICA) 150 MG capsule 1 capsule (150 mg total).    . semaglutide (OZEMPIC) 2 mg/dose (8 mg/3 mL) PnIj INJECT 2MG  UNDER THE SKIN EVERY 7 DAYS 3 mL 11  . spironolactone  (ALDACTONE ) 25 MG tablet Take 0.5 tablets (12.5 mg total) by mouth daily. TAKE 0.5 TABLETS (12.5 MG TOTAL) BY  MOUTH DAILY.    SABRA torsemide 40 mg Tab Take 1 tablet (40 mg total) by mouth daily.    SABRA azelastine (ASTELIN) 137 mcg (0.1 %) nasal spray azelastine 137 mcg (0.1 %) nasal spray aerosol  USE 1 SPRAY(S) IN EACH NOSTRIL TWICE DAILY    . doxepin (SINEQUAN) 10 MG capsule Take 1 capsule (10 mg total) by mouth nightly. (Patient not taking: Reported on 09/22/2024) 30 capsule 11  . furosemide  (LASIX ) 20 MG tablet Take 1 tablet by mouth once daily. (Patient not taking: Reported on 09/22/2024) 90 tablet 2   No facility-administered medications prior to visit.

## 2024-09-22 ENCOUNTER — Encounter: Admitting: Internal Medicine

## 2024-09-23 ENCOUNTER — Emergency Department
Admission: EM | Admit: 2024-09-23 | Discharge: 2024-09-23 | Disposition: A | Discharge status: Home / Self Care | Attending: Emergency Medicine | Admitting: Emergency Medicine

## 2024-09-23 ENCOUNTER — Other Ambulatory Visit: Payer: Self-pay

## 2024-09-23 ENCOUNTER — Emergency Department

## 2024-09-23 DIAGNOSIS — S0012XA Contusion of left eyelid and periocular area, initial encounter: Secondary | ICD-10-CM | POA: Insufficient documentation

## 2024-09-23 DIAGNOSIS — M79602 Pain in left arm: Secondary | ICD-10-CM | POA: Diagnosis not present

## 2024-09-23 DIAGNOSIS — I110 Hypertensive heart disease with heart failure: Secondary | ICD-10-CM | POA: Insufficient documentation

## 2024-09-23 DIAGNOSIS — S0591XA Unspecified injury of right eye and orbit, initial encounter: Secondary | ICD-10-CM | POA: Diagnosis present

## 2024-09-23 DIAGNOSIS — M79652 Pain in left thigh: Secondary | ICD-10-CM | POA: Diagnosis not present

## 2024-09-23 DIAGNOSIS — I10 Essential (primary) hypertension: Secondary | ICD-10-CM | POA: Insufficient documentation

## 2024-09-23 DIAGNOSIS — Z79899 Other long term (current) drug therapy: Secondary | ICD-10-CM | POA: Diagnosis not present

## 2024-09-23 DIAGNOSIS — S0003XA Contusion of scalp, initial encounter: Secondary | ICD-10-CM | POA: Diagnosis not present

## 2024-09-23 DIAGNOSIS — Y990 Civilian activity done for income or pay: Secondary | ICD-10-CM | POA: Diagnosis not present

## 2024-09-23 DIAGNOSIS — N390 Urinary tract infection, site not specified: Secondary | ICD-10-CM | POA: Insufficient documentation

## 2024-09-23 DIAGNOSIS — R0789 Other chest pain: Secondary | ICD-10-CM | POA: Insufficient documentation

## 2024-09-23 DIAGNOSIS — S0990XA Unspecified injury of head, initial encounter: Secondary | ICD-10-CM | POA: Diagnosis present

## 2024-09-23 DIAGNOSIS — I959 Hypotension, unspecified: Secondary | ICD-10-CM | POA: Diagnosis present

## 2024-09-23 DIAGNOSIS — I509 Heart failure, unspecified: Secondary | ICD-10-CM | POA: Diagnosis not present

## 2024-09-23 DIAGNOSIS — E119 Type 2 diabetes mellitus without complications: Secondary | ICD-10-CM | POA: Insufficient documentation

## 2024-09-23 DIAGNOSIS — S0010XA Contusion of unspecified eyelid and periocular area, initial encounter: Secondary | ICD-10-CM

## 2024-09-23 DIAGNOSIS — S0011XA Contusion of right eyelid and periocular area, initial encounter: Secondary | ICD-10-CM | POA: Insufficient documentation

## 2024-09-23 DIAGNOSIS — W19XXXA Unspecified fall, initial encounter: Secondary | ICD-10-CM | POA: Insufficient documentation

## 2024-09-23 LAB — CBC WITH DIFFERENTIAL/PLATELET
Abs Immature Granulocytes: 0.04 K/uL (ref 0.00–0.07)
Basophils Absolute: 0.1 K/uL (ref 0.0–0.1)
Basophils Relative: 1 %
Eosinophils Absolute: 0.3 K/uL (ref 0.0–0.5)
Eosinophils Relative: 3 %
HCT: 34 % — ABNORMAL LOW (ref 36.0–46.0)
Hemoglobin: 10.7 g/dL — ABNORMAL LOW (ref 12.0–15.0)
Immature Granulocytes: 0 %
Lymphocytes Relative: 28 %
Lymphs Abs: 2.5 K/uL (ref 0.7–4.0)
MCH: 29.2 pg (ref 26.0–34.0)
MCHC: 31.5 g/dL (ref 30.0–36.0)
MCV: 92.9 fL (ref 80.0–100.0)
Monocytes Absolute: 0.5 K/uL (ref 0.1–1.0)
Monocytes Relative: 5 %
Neutro Abs: 5.7 K/uL (ref 1.7–7.7)
Neutrophils Relative %: 63 %
Platelets: 318 K/uL (ref 150–400)
RBC: 3.66 MIL/uL — ABNORMAL LOW (ref 3.87–5.11)
RDW: 12.8 % (ref 11.5–15.5)
WBC: 9 K/uL (ref 4.0–10.5)
nRBC: 0 % (ref 0.0–0.2)

## 2024-09-23 LAB — COMPREHENSIVE METABOLIC PANEL WITH GFR
ALT: 15 U/L (ref 0–44)
AST: 19 U/L (ref 15–41)
Albumin: 3.5 g/dL (ref 3.5–5.0)
Alkaline Phosphatase: 197 U/L — ABNORMAL HIGH (ref 38–126)
Anion gap: 11 (ref 5–15)
BUN: 19 mg/dL (ref 8–23)
CO2: 26 mmol/L (ref 22–32)
Calcium: 8.4 mg/dL — ABNORMAL LOW (ref 8.9–10.3)
Chloride: 102 mmol/L (ref 98–111)
Creatinine, Ser: 1.25 mg/dL — ABNORMAL HIGH (ref 0.44–1.00)
GFR, Estimated: 47 mL/min — ABNORMAL LOW (ref >60)
Glucose, Bld: 71 mg/dL (ref 70–99)
Potassium: 3.1 mmol/L — ABNORMAL LOW (ref 3.5–5.1)
Sodium: 139 mmol/L (ref 135–145)
Total Bilirubin: 0.5 mg/dL (ref 0.0–1.2)
Total Protein: 7.1 g/dL (ref 6.5–8.1)

## 2024-09-23 LAB — URINALYSIS, W/ REFLEX TO CULTURE (INFECTION SUSPECTED)
Bilirubin Urine: NEGATIVE
Glucose, UA: >=500 mg/dL — AB
Hgb urine dipstick: NEGATIVE
Ketones, ur: NEGATIVE mg/dL
Nitrite: NEGATIVE
Protein, ur: NEGATIVE mg/dL
Specific Gravity, Urine: 1.011 (ref 1.005–1.030)
pH: 5 (ref 5.0–8.0)

## 2024-09-23 LAB — TROPONIN I (HIGH SENSITIVITY): Troponin I (High Sensitivity): 4 ng/L (ref <18)

## 2024-09-23 MED ORDER — ACETAMINOPHEN 500 MG PO TABS
1000.0000 mg | ORAL_TABLET | Freq: Once | ORAL | Status: AC
  Administered 2024-09-23: 1000 mg via ORAL
  Filled 2024-09-23: qty 2

## 2024-09-23 MED ORDER — LIDOCAINE 5 % EX PTCH
1.0000 | MEDICATED_PATCH | CUTANEOUS | Status: DC
  Administered 2024-09-23: 1 via TRANSDERMAL
  Filled 2024-09-23: qty 1

## 2024-09-23 MED ORDER — CEPHALEXIN 500 MG PO CAPS
500.0000 mg | ORAL_CAPSULE | Freq: Once | ORAL | Status: AC
  Administered 2024-09-23: 500 mg via ORAL
  Filled 2024-09-23: qty 1

## 2024-09-23 MED ORDER — CEPHALEXIN 500 MG PO CAPS: 500.0000 mg | ORAL_CAPSULE | Freq: Two times a day (BID) | ORAL | 0 refills | Status: AC

## 2024-09-23 MED ORDER — LIDOCAINE 5 % EX PTCH: 1.0000 | MEDICATED_PATCH | CUTANEOUS | 0 refills | Status: DC

## 2024-09-23 NOTE — ED Triage Notes (Addendum)
 From work with mechanical fall over her cane, Denies LOC. Denies blood thinners. Initial BP 72/46. Denies CP/ dizziness. EMS gave 500ml NS BP  100/46. Bruising to R eye, c/o pain to L hip, shoulder and leg

## 2024-09-23 NOTE — Discharge Instructions (Addendum)
 Please see the antibiotics as prescribed for UTI.  Please measure to keep self hydrated.  Please follow-up with your primary care doctor next week to get reassessed.  You can take 650 mg of Tylenol  every 6 hours as needed for pain.  I have also prescribed you some Lidoderm  patches that you can use for pain.

## 2024-09-23 NOTE — ED Provider Notes (Signed)
 Courtney Murphy Provider Note    Event Date/Time   First MD Initiated Contact with Patient 09/23/24 1508     (approximate)   History   Fall (From work with mechanical fall over her cane, Denies LOC. Denies hitting her head. Initial BP 72/46. Denies CP/ dizziness. EMS gave 500ml NS BP  100/46) and Hypotension   HPI  Courtney Murphy is a 68 y.o. female with history of CHF, depression, diabetes, hypertension, not on blood thinners, presenting with bruising to her periorbital region, left arm pain, left rib pain, left thigh pain after fall.  Patient states that she uses a cane when she walks, think she tripped on something and fell.  No LOC.  Is not any blood thinners.  Patient states that she is been feeling fine today.  Has been eating and drinking, no chest pain or shortness of breath prior, no lightheadedness, no urinary symptoms, no nausea vomiting or diarrhea.  States that she might not have been drinking a lot of water today.  Did take her blood pressure medications today.  Independent history from EMS, she was noted to be hypotensive to the 70s, she denies any symptoms for them.  They gave her 500 cc bolus of fluids, repeat blood pressures was 100.     Physical Exam   Triage Vital Signs: ED Triage Vitals  Encounter Vitals Group     BP 09/23/24 1418 126/74     Girls Systolic BP Percentile --      Girls Diastolic BP Percentile --      Boys Systolic BP Percentile --      Boys Diastolic BP Percentile --      Pulse Rate 09/23/24 1418 75     Resp 09/23/24 1418 16     Temp 09/23/24 1421 97.9 F (36.6 C)     Temp Source 09/23/24 1421 Oral     SpO2 --      Weight --      Height --      Head Circumference --      Peak Flow --      Pain Score 09/23/24 1417 7     Pain Loc --      Pain Education --      Exclude from Growth Chart --     Most recent vital signs: Vitals:   09/23/24 1418 09/23/24 1421  BP: 126/74   Pulse: 75   Resp: 16   Temp:  97.9 F  (36.6 C)     General: Awake, no distress.  CV:  Good peripheral perfusion.  Resp:  Normal effort.  Left lower anterior lateral rib tenderness, no tachypnea Abd:  No distention.  Soft nontender Other:  No palpable skull deformities or tenderness, she does have mild periorbital bruising bilaterally, TMs are clear bilaterally without hemotympanum, no nasal septal hematoma, pupils are equal and reactive, extraocular movements are intact, no trismus or tenderness to the rest of her face.  No midline spinal tenderness, she is able to fully range all 4 extremities, no focal weakness or numbness, palpable DP and radial pulses bilaterally, she does have tenderness to the left humerus, left lateral thigh.   ED Results / Procedures / Treatments   Labs (all labs ordered are listed, but only abnormal results are displayed) Labs Reviewed  COMPREHENSIVE METABOLIC PANEL WITH GFR - Abnormal; Notable for the following components:      Result Value   Potassium 3.1 (*)    Creatinine, Ser 1.25 (*)  Calcium  8.4 (*)    Alkaline Phosphatase 197 (*)    GFR, Estimated 47 (*)    All other components within normal limits  CBC WITH DIFFERENTIAL/PLATELET - Abnormal; Notable for the following components:   RBC 3.66 (*)    Hemoglobin 10.7 (*)    HCT 34.0 (*)    All other components within normal limits  URINALYSIS, W/ REFLEX TO CULTURE (INFECTION SUSPECTED) - Abnormal; Notable for the following components:   Color, Urine YELLOW (*)    APPearance CLEAR (*)    Glucose, UA >=500 (*)    Leukocytes,Ua TRACE (*)    Bacteria, UA RARE (*)    All other components within normal limits  URINE CULTURE  TROPONIN I (HIGH SENSITIVITY)     EKG  EKG shows, sinus rhythm, rate 74, normal QS, normal QTc, no obvious ischemic ST elevation, not significant change compared to prior   RADIOLOGY On my independent interpretation, CT head without obvious intracranial hemorrhage   PROCEDURES:  Critical Care performed:  No  Procedures   MEDICATIONS ORDERED IN ED: Medications  lidocaine  (LIDODERM ) 5 % 1 patch (1 patch Transdermal Patch Applied 09/23/24 1606)  acetaminophen  (TYLENOL ) tablet 1,000 mg (1,000 mg Oral Given 09/23/24 1607)  cephALEXin  (KEFLEX ) capsule 500 mg (500 mg Oral Given 09/23/24 1807)     IMPRESSION / MDM / ASSESSMENT AND PLAN / ED COURSE  I reviewed the triage vital signs and the nursing notes.                              Differential diagnosis includes, but is not limited to, mechanical fall, did consider fracture, contusion, strain, sprain, intracranial hemorrhage.  Initial low blood pressure could have been secondary to dehydration, electrolyte derangements, blood pressure medication.  Also considered occult infection such as UTI, arrhythmia, atypical ACS.  Will get labs, EKG, troponin, chest x-ray, CT head, cervical spine, max face, x-ray of her humerus and femur.  Will give her some Tylenol  and Lidoderm  patch here.  Patient's presentation is most consistent with acute presentation with potential threat to life or bodily function.  Independent interpretation of labs and imaging below.  On reassessment patient has been a lot better, not hypotensive in the emergency department, ambulatory without issues.  Discussed with her about imaging and lab results including incidental findings.  Will also start her on antibiotics for her UTI.  And sent prescriptions to her pharmacy.  Did discuss with her about taking antibiotics, hydrating herself, follow-up with primary care next week to get reassessed.  Otherwise consider but no indication for inpatient admission at this time, she safe for outpatient management.  Will discharge with strict return precautions.  Shared decision making done with patient and she is agreeable with this plan.  The patient is on the cardiac monitor to evaluate for evidence of arrhythmia and/or significant heart rate changes.   Clinical Course as of 09/23/24 1814   Sat Sep 23, 2024  1640 DG Humerus Left Negative [TT]  1640 CT Maxillofacial WO CM IMPRESSION: 1. No acute facial fracture. 2. Right supraorbital scalp hematoma without underlying fracture or foreign body.   [TT]  1640 CT Cervical Spine Wo Contrast 1. No acute abnormality of the cervical spine related to neck trauma.  [TT]  1640 CT Head Wo Contrast IMPRESSION: 1. No acute intracranial abnormality. 2. Right cerebral scalp hematoma without underlying fracture or foreign body.   [TT]  1640 DG Hip Unilat With  Pelvis 2-3 Views Left No acute osseous abnormality.  [TT]  1645 DG Chest 2 View No active cardiopulmonary disease.  [TT]  1645 DG FEMUR MIN 2 VIEWS LEFT Negative [TT]  1800 Urinalysis, w/ Reflex to Culture (Infection Suspected) -Urine, Clean Catch(!) UA consistent with UTI.  Will start her on antibiotics p.o. [TT]    Clinical Course User Index [TT] Waymond Lorelle Cummins, MD     FINAL CLINICAL IMPRESSION(S) / ED DIAGNOSES   Final diagnoses:  Fall, initial encounter  Periorbital ecchymosis, unspecified laterality, initial encounter  Pain of left upper extremity  Rib pain  Pain of left thigh  Urinary tract infection without hematuria, site unspecified     Rx / DC Orders   ED Discharge Orders          Ordered    cephALEXin  (KEFLEX ) 500 MG capsule  2 times daily        09/23/24 1812    lidocaine  (LIDODERM ) 5 %  Every 24 hours        09/23/24 1812             Note:  This document was prepared using Dragon voice recognition software and may include unintentional dictation errors.    Waymond Lorelle Cummins, MD 09/23/24 [phone]

## 2024-09-24 LAB — URINE CULTURE: Culture: <10000 — AB

## 2024-09-26 ENCOUNTER — Ambulatory Visit: Admitting: Cardiology

## 2024-09-29 ENCOUNTER — Inpatient Hospital Stay

## 2024-09-29 ENCOUNTER — Inpatient Hospital Stay: Attending: Internal Medicine

## 2024-09-29 ENCOUNTER — Encounter: Payer: Self-pay | Admitting: Nurse Practitioner

## 2024-09-29 ENCOUNTER — Ambulatory Visit: Admitting: Internal Medicine

## 2024-09-29 ENCOUNTER — Inpatient Hospital Stay (HOSPITAL_BASED_OUTPATIENT_CLINIC_OR_DEPARTMENT_OTHER): Admitting: Nurse Practitioner

## 2024-09-29 ENCOUNTER — Ambulatory Visit

## 2024-09-29 ENCOUNTER — Other Ambulatory Visit

## 2024-09-29 VITALS — BP 103/65 | HR 84

## 2024-09-29 VITALS — BP 115/78 | HR 95 | Temp 97.8°F | Resp 18 | Ht 64.0 in | Wt 162.0 lb

## 2024-09-29 DIAGNOSIS — D6861 Antiphospholipid syndrome: Secondary | ICD-10-CM | POA: Diagnosis not present

## 2024-09-29 DIAGNOSIS — E538 Deficiency of other specified B group vitamins: Secondary | ICD-10-CM | POA: Insufficient documentation

## 2024-09-29 DIAGNOSIS — D509 Iron deficiency anemia, unspecified: Secondary | ICD-10-CM | POA: Diagnosis present

## 2024-09-29 DIAGNOSIS — Z86711 Personal history of pulmonary embolism: Secondary | ICD-10-CM | POA: Insufficient documentation

## 2024-09-29 DIAGNOSIS — K909 Intestinal malabsorption, unspecified: Secondary | ICD-10-CM

## 2024-09-29 DIAGNOSIS — E119 Type 2 diabetes mellitus without complications: Secondary | ICD-10-CM | POA: Diagnosis not present

## 2024-09-29 DIAGNOSIS — R760 Raised antibody titer: Secondary | ICD-10-CM

## 2024-09-29 DIAGNOSIS — E611 Iron deficiency: Secondary | ICD-10-CM | POA: Diagnosis not present

## 2024-09-29 DIAGNOSIS — D649 Anemia, unspecified: Secondary | ICD-10-CM

## 2024-09-29 LAB — CBC WITH DIFFERENTIAL (CANCER CENTER ONLY)
Abs Immature Granulocytes: 0.04 K/uL (ref 0.00–0.07)
Basophils Absolute: 0.1 K/uL (ref 0.0–0.1)
Basophils Relative: 1 %
Eosinophils Absolute: 0.3 K/uL (ref 0.0–0.5)
Eosinophils Relative: 3 %
HCT: 36.5 % (ref 36.0–46.0)
Hemoglobin: 11.9 g/dL — ABNORMAL LOW (ref 12.0–15.0)
Immature Granulocytes: 0 %
Lymphocytes Relative: 29 %
Lymphs Abs: 2.8 K/uL (ref 0.7–4.0)
MCH: 29.5 pg (ref 26.0–34.0)
MCHC: 32.6 g/dL (ref 30.0–36.0)
MCV: 90.6 fL (ref 80.0–100.0)
Monocytes Absolute: 0.6 K/uL (ref 0.1–1.0)
Monocytes Relative: 6 %
Neutro Abs: 6 K/uL (ref 1.7–7.7)
Neutrophils Relative %: 61 %
Platelet Count: 300 K/uL (ref 150–400)
RBC: 4.03 MIL/uL (ref 3.87–5.11)
RDW: 13.1 % (ref 11.5–15.5)
WBC Count: 9.7 K/uL (ref 4.0–10.5)
nRBC: 0 % (ref 0.0–0.2)

## 2024-09-29 LAB — BASIC METABOLIC PANEL WITH GFR
Anion gap: 11 (ref 5–15)
BUN: 23 mg/dL (ref 8–23)
CO2: 26 mmol/L (ref 22–32)
Calcium: 8.9 mg/dL (ref 8.9–10.3)
Chloride: 100 mmol/L (ref 98–111)
Creatinine, Ser: 1.15 mg/dL — ABNORMAL HIGH (ref 0.44–1.00)
GFR, Estimated: 52 mL/min — ABNORMAL LOW (ref >60)
Glucose, Bld: 136 mg/dL — ABNORMAL HIGH (ref 70–99)
Potassium: 4 mmol/L (ref 3.5–5.1)
Sodium: 137 mmol/L (ref 135–145)

## 2024-09-29 LAB — IRON AND TIBC
Iron: 56 ug/dL (ref 28–170)
Saturation Ratios: 15 % (ref 10.4–31.8)
TIBC: 372 ug/dL (ref 250–450)
UIBC: 316 ug/dL

## 2024-09-29 LAB — VITAMIN B12: Vitamin B-12: 1467 pg/mL — ABNORMAL HIGH (ref 180–914)

## 2024-09-29 LAB — FERRITIN: Ferritin: 53 ng/mL (ref 11–307)

## 2024-09-29 MED ORDER — IRON SUCROSE 20 MG/ML IV SOLN
200.0000 mg | Freq: Once | INTRAVENOUS | Status: AC
  Administered 2024-09-29: 200 mg via INTRAVENOUS
  Filled 2024-09-29: qty 10

## 2024-09-29 NOTE — Addendum Note (Signed)
 Addended by: LAEL BROWNING A on: 09/29/2024 03:26 PM   Modules accepted: Orders

## 2024-09-29 NOTE — Progress Notes (Signed)
 Patient states that she is doing ok, no new questions for today.

## 2024-09-29 NOTE — Patient Instructions (Signed)

## 2024-09-29 NOTE — Progress Notes (Signed)
 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   Courtney Murphy Friend 68 y.o. 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 Denies blood in stools or black-colored stools. Denies vaginal bleeding.   Review of Systems  Constitutional:  Positive for malaise/fatigue. Negative for chills, diaphoresis, fever and weight loss.  HENT:  Negative for nosebleeds and sore throat.   Eyes:  Negative for double vision.  Respiratory:  Negative for cough, hemoptysis, sputum production, shortness of breath and wheezing.   Cardiovascular:  Negative for chest pain, palpitations, orthopnea and leg swelling.  Gastrointestinal:  Negative for abdominal pain, blood in stool, constipation, diarrhea, heartburn, melena, nausea and vomiting.  Genitourinary:  Negative for dysuria, frequency and urgency.  Musculoskeletal:  Positive for back pain and joint pain. Negative for falls.  Skin: Negative.  Negative for itching and rash.  Neurological:  Negative for dizziness,  tingling, focal weakness, weakness and headaches.  Endo/Heme/Allergies:  Does not bruise/bleed easily.  Psychiatric/Behavioral:  Negative for depression. The patient is not nervous/anxious and does not have insomnia.      MEDICAL HISTORY:  Past Medical History:  Diagnosis Date   Acid reflux 10/11/2015   Anemia    RECEIVES IRON  INFUSIONS   Arthritis    Asthma    WELL CONTROLLED   CHF (congestive heart failure) (HCC)    Depression    Diabetes mellitus without complication (HCC)    Fatty liver    Gout    History of kidney stones    History of methicillin resistant staphylococcus aureus (MRSA) 2016   Hypertension    H/O BEEN OFF BP MEDS FOR 1 YEAR   Hypothyroidism    Kidney stone    Knee pain    Left   Pulmonary emboli (HCC) 2011   Restless leg syndrome    Shoulder pain     SURGICAL HISTORY: Past Surgical History:  Procedure Laterality Date   ABDOMINAL HYSTERECTOMY     BACK SURGERY     BREAST REDUCTION SURGERY     CATARACT EXTRACTION W/PHACO Left 07/28/2017   Procedure: CATARACT EXTRACTION PHACO AND INTRAOCULAR LENS PLACEMENT (IOC) diabetic Left;  Surgeon: Mittie Gaskin, MD;  Location: [facility];  Service: Ophthalmology;  Laterality: Left;  Diabetic   CESAREAN SECTION  1990   CHOLECYSTECTOMY     CYSTOSCOPY/URETEROSCOPY/HOLMIUM LASER/STENT PLACEMENT Left 09/08/2017   Procedure: CYSTOSCOPY/URETEROSCOPY/HOLMIUM LASER/STENT EXCHANGE;  Surgeon: Penne Knee, MD;  Location: [facility];  Service: Urology;  Laterality: Left;   CYSTOSCOPY/URETEROSCOPY/HOLMIUM LASER/STENT PLACEMENT Left 12/14/2018   Procedure: CYSTOSCOPY/URETEROSCOPY/HOLMIUM LASER/STENT PLACEMENT;  Surgeon: Penne Knee, MD;  Location: [facility];  Service: Urology;  Laterality: Left;   FOOT SURGERY     GASTRIC BYPASS  IR NEPHROSTOMY PLACEMENT LEFT  08/17/2017   KNEE ARTHROSCOPY Left    KNEE ARTHROSCOPY Left 07/19/2015   Procedure: ARTHROSCOPY KNEE;  Surgeon: Helayne Glenn, MD;  Location:  [facility];  Service: Orthopedics;  Laterality: Left;   NEPHROLITHOTOMY Left 08/17/2017   Procedure: NEPHROLITHOTOMY PERCUTANEOUS WITH HOLMIUM LASER;  Surgeon: Penne Knee, MD;  Location: [facility];  Service: Urology;  Laterality: Left;   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/2021   THUMB ARTHROSCOPY     TONSILLECTOMY      SOCIAL HISTORY: Social History   Socioeconomic History   Marital status: Widowed    Spouse name: Not on file   Number of children: Not on file   Years of education: Not on file   Highest education level: Not on file  Occupational History   Not on file  Tobacco Use   Smoking status: Former    Current packs/day: 0.00    Average packs/day: 0.5 packs/day for 4.0 years (2.0 ttl pk-yrs)    Types: Cigarettes    Start date: 04/14/2011    Quit date: 04/14/2015    Years since quitting: 9.4   Smokeless tobacco: Never  Vaping Use   Vaping status: Never Used  Substance and Sexual Activity   Alcohol use: Not Currently    Comment: RARE   Drug use: No   Sexual activity: Never  Other Topics Concern   Not on file  Social History Narrative   Quit smoking > 30 years ago; rare alcohol. 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 Lives in [place]; with dog.    Social Drivers of Corporate Investment Banker Strain: Low Risk  (09/20/2024)   Received from [facility]   Overall Financial Resource Strain (CARDIA)    How hard is it for you to pay for the very basics like food, housing, medical care, and heating?: Not hard at all  Recent Concern: Financial Resource Strain - Medium Risk (08/16/2024)   Received from [facility]   Overall Financial Resource Strain (CARDIA)    How hard is it for you to pay for the very basics like food, housing, medical care, and heating?: Somewhat hard  Food Insecurity: No Food Insecurity (09/20/2024)   Received from [facility]   Hunger Vital Sign    Within the past 12 months, you worried that your  food would run out before you got the money to buy more.: Never true    Within the past 12 months, the food you bought just didn't last and you didn't have money to get more.: Never true  Recent Concern: Food Insecurity - Food Insecurity Present (08/16/2024)   Received from [facility]   Hunger Vital Sign    Within the past 12 months, you worried that your food would run out before you got the money to buy more.: Sometimes true    Within the past 12 months, the food you bought just didn't last and you didn't have money to get more.: Never true  Transportation Needs: No Transportation Needs (09/20/2024)   Received from [facility] - Transportation    Lack of Transportation (Medical): No    Lack of Transportation (Non-Medical): No  Physical Activity: Inactive (09/20/2024)   Received from [facility]   Exercise Vital Sign    On average, how many days per week do you engage in moderate to strenuous exercise (like a brisk walk)?: 0 days    On average,  how many minutes do you engage in exercise at this level?: 0 min  Stress: No Stress Concern Present (09/20/2024)   Received from [facility] Occupational [facility] - Occupational Stress Questionnaire    Do you feel stress - tense, restless, nervous, or anxious, or unable to sleep at night because your mind is troubled all the time - these days?: Not at all  Social Connections: Moderately Isolated (09/20/2024)   Received from [facility]   Social Connection and Isolation Panel    In a typical week, how many times do you talk on the phone with family, friends, or neighbors?: More than three times a week    How often do you get together with friends or relatives?: More than three times a week    How often do you attend church or religious services?: Never    Do you belong to any clubs or organizations such as church groups, unions, fraternal or athletic groups, or school groups?: Yes    How often do  you attend meetings of the clubs or organizations you belong to?: More than 4 times per year    Are you married, widowed, divorced, separated, never married, or living with a partner?: Widowed  Intimate Partner Violence: Not At Risk (09/20/2024)   Received from [facility]   Humiliation, Afraid, Rape, and Kick questionnaire    Within the last year, have you been afraid of your partner or ex-partner?: No    Within the last year, have you been humiliated or emotionally abused in other ways by your partner or ex-partner?: No    Within the last year, have you been kicked, hit, slapped, or otherwise physically hurt by your partner or ex-partner?: No    Within the last year, have you been raped or forced to have any kind of sexual activity by your partner or ex-partner?: No    FAMILY HISTORY: Family History  Problem Relation Age of Onset   Cancer Mother        breast   Heart disease Father    Diabetes Father    Hypertension Father    Cancer Maternal Grandmother    Cancer Maternal Grandfather    Heart disease Paternal Grandmother    Heart disease Paternal Grandfather    Cancer Maternal Aunt    Cancer Maternal Uncle    Heart disease Paternal Aunt    Heart disease Paternal Uncle    Kidney cancer Neg Hx    Bladder Cancer Neg Hx     ALLERGIES:  is allergic to sulfasalazine, nsaids, sulfa antibiotics, and penicillins.  MEDICATIONS:  Current Outpatient Medications  Medication Sig Dispense Refill   albuterol  (VENTOLIN  HFA) 108 (90 Base) MCG/ACT inhaler Inhale 2 puffs into the lungs every 6 (six) hours as needed.     atomoxetine  (STRATTERA ) 40 MG capsule      atorvastatin  (LIPITOR) 40 MG tablet Take 1 tablet (40 mg total) by mouth daily. 90 tablet 1   cyanocobalamin  (,VITAMIN B-12,) 1000 MCG/ML injection Inject 1,000 mcg into the muscle once a week.     cyclobenzaprine  (FLEXERIL ) 5 MG tablet Take 1 tablet (5 mg total) by mouth 3 (three) times daily as needed. 30 tablet 0   DULoxetine  HCl 40 MG CPEP Take 1 capsule by mouth daily.     empagliflozin  (JARDIANCE ) 10 MG TABS tablet Take 10 mg by mouth daily.     fluticasone (FLONASE) 50 MCG/ACT nasal spray Place 2 sprays into both  nostrils daily as needed for rhinitis or allergies.     HYDROcodone -acetaminophen  (NORCO ) 10-325 MG tablet Take 1 tablet by mouth every 6 (six) hours as needed for severe pain (pain score 7-10).     levothyroxine  (SYNTHROID , LEVOTHROID) 100 MCG tablet Take 100 mcg by mouth daily before breakfast.     metFORMIN (GLUCOPHAGE-XR) 750 MG 24 hr tablet Take by mouth.     OZEMPIC, 2 MG/DOSE, 8 MG/3ML SOPN Inject 2 mg into the skin once a week.     potassium chloride  SA (KLOR-CON  M) 20 MEQ tablet Take 1 tablet (20 mEq total) by mouth daily. 30 tablet 5   pramipexole  (MIRAPEX ) 0.5 MG tablet Take 0.5-1 mg by mouth at bedtime.      pregabalin (LYRICA) 50 MG capsule Take 150 mg by mouth 2 (two) times daily.     spironolactone  (ALDACTONE ) 25 MG tablet Take 12.5 mg by mouth daily.     Torsemide 40 MG TABS Take 40 mg by mouth daily. 30 tablet 5   No current facility-administered medications for this visit.    PHYSICAL EXAMINATION: Vitals:   09/29/24 1428  BP: 115/78  Pulse: 95  Resp: 18  Temp: 97.8 F (36.6 C)  SpO2: 98%   Filed Weights   09/29/24 1428  Weight: 162 lb (73.5 kg)   Physical Exam Constitutional:      Appearance: She is not ill-appearing.  Eyes:     General: No scleral icterus.    Conjunctiva/sclera: Conjunctivae normal.  Cardiovascular:     Rate and Rhythm: Normal rate and regular rhythm.  Abdominal:     General: There is no distension.     Palpations: Abdomen is soft.     Tenderness: There is no abdominal tenderness. There is no guarding.  Musculoskeletal:        General: No deformity.     Right lower leg: No edema.     Left lower leg: Edema present.  Lymphadenopathy:     Cervical: No cervical adenopathy.  Skin:    General: Skin is warm and dry.  Neurological:     Mental  Status: She is alert and oriented to person, place, and time. Mental status is at baseline.  Psychiatric:        Mood and Affect: Mood normal.        Behavior: Behavior normal.    LABORATORY DATA:  I have reviewed the data as listed Lab Results  Component Value Date   WBC 9.7 09/29/2024   HGB 11.9 (L) 09/29/2024   HCT 36.5 09/29/2024   MCV 90.6 09/29/2024   PLT 300 09/29/2024   Recent Labs    08/01/24 1918 08/28/24 1525 09/23/24 1430 09/29/24 1400  NA 139 141 139 137  K 4.0 3.7 3.1* 4.0  CL 99 95* 102 100  CO2 28 27 26 26   GLUCOSE 105* 93 71 136*  BUN 20 18 19 23   CREATININE 1.02* 0.94 1.25* 1.15*  CALCIUM  9.1 9.5 8.4* 8.9  GFRNONAA 60*  --  47* 52*  PROT  --   --  7.1  --   ALBUMIN  --   --  3.5  --   AST  --   --  19  --   ALT  --   --  15  --   ALKPHOS  --   --  197*  --   BILITOT  --   --  0.5  --    Iron /TIBC/Ferritin/ %Sat    Component Value Date/Time  IRON  56 09/29/2024 1400   TIBC 372 09/29/2024 1400   FERRITIN 53 09/29/2024 1400   IRONPCTSAT 15 09/29/2024 1400    ASSESSMENT & PLAN:   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  S/p IV Venofer    # Today hemoglobin is 11.9. Ferritin 58, iron  sat 15%. Symptomatic mildly. Given gastric bypass/malabsorption, she will likely need IV iron  intermittently lifelong. Proceed with venofer  today. Discussed that symptoms are more likely related to b12 deficiency as opposed to iron .    # B12 def- d/t malabsorption. On home injections per pcp. Recently dropped to 200s. Noncompliant with injections but has restarted. Levels pending today. Recommend she continue monthly injections and sublingual tablets.    # Left leg pain/calf; and swelling around ankle [facility] 2024- s/p ankle fusion]- s/p US  in clinic as per pt- NO blood clot. On xarelto  10 mg/day [per ortho] now discontinued. S/p consult with vascular: consistent with lymphedema.    # T2DM- - BG 174.  Follow up with PCP.    # DISPOSITION: # Venofer  today # 4 mo- labs (cbc, bmp, ferritin, iron  studies, b12), Dr Rennie, +/- venofer - la   No problem-specific Assessment & Plan notes found for this encounter.  All questions were answered. The patient knows to call the clinic with any problems, questions or concerns.   Courtney Murphy Dawn, NP 09/29/2024

## 2024-10-12 ENCOUNTER — Emergency Department
Admission: EM | Admit: 2024-10-12 | Discharge: 2024-10-12 | Disposition: A | Discharge status: Home / Self Care | Attending: Emergency Medicine | Admitting: Emergency Medicine

## 2024-10-12 ENCOUNTER — Other Ambulatory Visit: Payer: Self-pay

## 2024-10-12 DIAGNOSIS — M5442 Lumbago with sciatica, left side: Secondary | ICD-10-CM | POA: Diagnosis not present

## 2024-10-12 DIAGNOSIS — I110 Hypertensive heart disease with heart failure: Secondary | ICD-10-CM | POA: Diagnosis not present

## 2024-10-12 DIAGNOSIS — E119 Type 2 diabetes mellitus without complications: Secondary | ICD-10-CM | POA: Insufficient documentation

## 2024-10-12 DIAGNOSIS — E039 Hypothyroidism, unspecified: Secondary | ICD-10-CM | POA: Diagnosis not present

## 2024-10-12 DIAGNOSIS — I509 Heart failure, unspecified: Secondary | ICD-10-CM | POA: Diagnosis not present

## 2024-10-12 DIAGNOSIS — M5431 Sciatica, right side: Secondary | ICD-10-CM

## 2024-10-12 DIAGNOSIS — M545 Low back pain, unspecified: Secondary | ICD-10-CM | POA: Diagnosis present

## 2024-10-12 NOTE — ED Triage Notes (Signed)
 Patient states right leg pain from hip down to ankle; denies injury and/or trauma.

## 2024-10-12 NOTE — ED Provider Notes (Signed)
 [facility] Emergency Department Provider Note     Event Date/Time   First MD Initiated Contact with Patient 10/12/24 1406     (approximate)   History   Leg Pain   HPI  Courtney Murphy is a 68 y.o. female with a history of CHF, depression, anxiety, DM, hypothyroidism, HTN, gout, and DDD with chronic radicular pain, presents to the ED for acute on chronic low back pain and RLE symptoms.  Denies any preceding injury, trauma, or fall.  Patient denies any bladder or bowel incontinence, foot drop, or saddle anesthesias.  At the time of my evaluation, patient is endorsing no resolution of her pain after she does her prescription narcotic pain medicine prior to arrival.  She reports some concern for DVT despite negative DVT ultrasound 2 months back.  Patient is not endorsing any leg swelling, erythema, warmth, or frank calf pain above her baseline.  She describes the discomfort she experienced, similar to her prior sciatic nerve flares.    Physical Exam   Triage Vital Signs: ED Triage Vitals  Encounter Vitals Group     BP 10/12/24 1244 103/70     Girls Systolic BP Percentile --      Girls Diastolic BP Percentile --      Boys Systolic BP Percentile --      Boys Diastolic BP Percentile --      Pulse Rate 10/12/24 1244 96     Resp 10/12/24 1244 20     Temp 10/12/24 1244 97.9 F (36.6 C)     Temp Source 10/12/24 1244 Oral     SpO2 10/12/24 1244 100 %     Weight 10/12/24 1244 160 lb (72.6 kg)     Height 10/12/24 1244 5' 4 (1.626 m)     Head Circumference --      Peak Flow --      Pain Score 10/12/24 1243 10     Pain Loc --      Pain Education --      Exclude from Growth Chart --     Most recent vital signs: Vitals:   10/12/24 1244  BP: 103/70  Pulse: 96  Resp: 20  Temp: 97.9 F (36.6 C)  SpO2: 100%    General Awake, no distress.  NAD HEENT NCAT. PERRL. EOMI. No rhinorrhea. Mucous membranes are moist.  CV:  Good peripheral perfusion.  No CCE  distally.  No skin temp or color changes. RESP:  Normal effort.  CTA MSK:  AROM of all extremities.  Negative Thompson test.  No pedal edema noted.   NEURO: CN II-XII grossly intact   ED Results / Procedures / Treatments   Labs (all labs ordered are listed, but only abnormal results are displayed) Labs Reviewed - No data to display   EKG   RADIOLOGY  Attention No results found.   PROCEDURES:  Critical Care performed: No  Procedures   MEDICATIONS ORDERED IN ED: Medications - No data to display   IMPRESSION / MDM / ASSESSMENT AND PLAN / ED COURSE  I reviewed the triage vital signs and the nursing notes.                              Differential diagnosis includes, but is not limited to, sciatica, lumbar radiculopathy, DDD, myalgia, sacroiliitis  Patient's presentation is most consistent with acute, uncomplicated illness.  Patient's diagnosis is consistent with exacerbation of her chronic  sciatica.  Geriatric patient with recent exam and workup at this time.  No red flags on exam.  Patient presents with nearly resolved symptoms after taking her previously prescribed narcotic pain medicine.  Clinical presentation is more consistent with a DVT.  Patient with no significant calf pain above baseline.  Negative Homans' sign, no CCE distally no SkinTemp or color changes.  Low suspicion for DVT at this presentation.  Therefore no indication for ultrasound testing the patient is agreeable to the plan and will follow-up as discussed.  Patient will be discharged home with instructions to take her home meds as prescribed. Patient is to follow up with her primary provider or specialist as needed or otherwise directed. Patient is given ED precautions to return to the ED for any worsening or new symptoms.     FINAL CLINICAL IMPRESSION(S) / ED DIAGNOSES   Final diagnoses:  Sciatica of right side     Rx / DC Orders   ED Discharge Orders     None        Note:  This document  was prepared using Dragon voice recognition software and may include unintentional dictation errors.    Loyd Candida LULLA Aldona, PA-C 10/14/24 1813    Claudene Rover, MD 10/15/24 STANLY

## 2024-10-12 NOTE — Discharge Instructions (Signed)
 Your exam is normal and reassuring exam.  No signs of a clot or concern for blood clot in your leg.  Your symptoms likely resent your sciatica leg pain.  You should take your home meds as prescribed.  Follow-up with your primary provider for ongoing evaluation.  Return to the ED if needed.

## 2024-10-12 NOTE — ED Notes (Signed)
 See triage note  Presents with pain to right hip  States pain radiates into her leg  Ambulates slowly d/t pai  Denies any injury

## 2024-10-15 ENCOUNTER — Other Ambulatory Visit: Payer: Self-pay | Admitting: Family

## 2024-11-06 ENCOUNTER — Encounter (INDEPENDENT_AMBULATORY_CARE_PROVIDER_SITE_OTHER): Payer: Self-pay

## 2024-11-06 ENCOUNTER — Ambulatory Visit (INDEPENDENT_AMBULATORY_CARE_PROVIDER_SITE_OTHER): Admitting: Vascular Surgery

## 2024-11-17 ENCOUNTER — Ambulatory Visit: Admitting: Cardiology

## 2024-11-20 ENCOUNTER — Ambulatory Visit: Attending: Family

## 2024-11-20 DIAGNOSIS — I5032 Chronic diastolic (congestive) heart failure: Secondary | ICD-10-CM | POA: Diagnosis not present

## 2024-11-20 LAB — ECHOCARDIOGRAM COMPLETE
AR max vel: 3.64 cm2
AV Area VTI: 3.88 cm2
AV Area mean vel: 3.61 cm2
AV Mean grad: 2 mmHg
AV Peak grad: 4 mmHg
Ao pk vel: 1 m/s
Area-P 1/2: 3.53 cm2
Calc EF: 51.6 %
S' Lateral: 2.7 cm
Single Plane A2C EF: 52 %
Single Plane A4C EF: 53.2 %

## 2024-11-21 ENCOUNTER — Ambulatory Visit (HOSPITAL_COMMUNITY): Payer: Self-pay | Admitting: Cardiology

## 2024-11-24 ENCOUNTER — Other Ambulatory Visit: Payer: Self-pay

## 2024-11-24 ENCOUNTER — Emergency Department

## 2024-11-24 ENCOUNTER — Emergency Department
Admission: EM | Admit: 2024-11-24 | Discharge: 2024-11-24 | Disposition: A | Discharge status: Home / Self Care | Attending: Emergency Medicine | Admitting: Emergency Medicine

## 2024-11-24 DIAGNOSIS — I110 Hypertensive heart disease with heart failure: Secondary | ICD-10-CM | POA: Diagnosis not present

## 2024-11-24 DIAGNOSIS — R10A1 Flank pain, right side: Secondary | ICD-10-CM | POA: Insufficient documentation

## 2024-11-24 DIAGNOSIS — J45909 Unspecified asthma, uncomplicated: Secondary | ICD-10-CM | POA: Insufficient documentation

## 2024-11-24 DIAGNOSIS — E039 Hypothyroidism, unspecified: Secondary | ICD-10-CM | POA: Diagnosis not present

## 2024-11-24 DIAGNOSIS — K5903 Drug induced constipation: Secondary | ICD-10-CM

## 2024-11-24 DIAGNOSIS — I509 Heart failure, unspecified: Secondary | ICD-10-CM | POA: Diagnosis not present

## 2024-11-24 DIAGNOSIS — E119 Type 2 diabetes mellitus without complications: Secondary | ICD-10-CM | POA: Insufficient documentation

## 2024-11-24 LAB — BASIC METABOLIC PANEL WITH GFR
Anion gap: 12 (ref 5–15)
BUN: 22 mg/dL (ref 8–23)
CO2: 31 mmol/L (ref 22–32)
Calcium: 9.4 mg/dL (ref 8.9–10.3)
Chloride: 97 mmol/L — ABNORMAL LOW (ref 98–111)
Creatinine, Ser: 1.01 mg/dL — ABNORMAL HIGH (ref 0.44–1.00)
GFR, Estimated: >60 mL/min
Glucose, Bld: 126 mg/dL — ABNORMAL HIGH (ref 70–99)
Potassium: 3.2 mmol/L — ABNORMAL LOW (ref 3.5–5.1)
Sodium: 141 mmol/L (ref 135–145)

## 2024-11-24 LAB — URINALYSIS, ROUTINE W REFLEX MICROSCOPIC
Bilirubin Urine: NEGATIVE
Glucose, UA: >=500 mg/dL — AB
Hgb urine dipstick: NEGATIVE
Ketones, ur: NEGATIVE mg/dL
Nitrite: NEGATIVE
Protein, ur: NEGATIVE mg/dL
Specific Gravity, Urine: 1.014 (ref 1.005–1.030)
pH: 5 (ref 5.0–8.0)

## 2024-11-24 LAB — CBC
HCT: 37.9 % (ref 36.0–46.0)
Hemoglobin: 12.3 g/dL (ref 12.0–15.0)
MCH: 28.9 pg (ref 26.0–34.0)
MCHC: 32.5 g/dL (ref 30.0–36.0)
MCV: 89.2 fL (ref 80.0–100.0)
Platelets: 276 K/uL (ref 150–400)
RBC: 4.25 MIL/uL (ref 3.87–5.11)
RDW: 14.2 % (ref 11.5–15.5)
WBC: 9.6 K/uL (ref 4.0–10.5)
nRBC: 0 % (ref 0.0–0.2)

## 2024-11-24 MED ORDER — SENNOSIDES-DOCUSATE SODIUM 8.6-50 MG PO TABS: 2.0000 | ORAL_TABLET | Freq: Two times a day (BID) | ORAL | 0 refills | Status: AC

## 2024-11-24 MED ORDER — IOHEXOL 350 MG/ML SOLN
100.0000 mL | Freq: Once | INTRAVENOUS | Status: AC | PRN
  Administered 2024-11-24: 100 mL via INTRAVENOUS

## 2024-11-24 MED ORDER — POLYETHYLENE GLYCOL 3350 17 GM/SCOOP PO POWD: ORAL | 0 refills | Status: AC

## 2024-11-24 NOTE — ED Provider Notes (Signed)
 "  [facility] Provider Note    Event Date/Time   First MD Initiated Contact with Patient 11/24/24 2210     (approximate)   History   Chief Complaint: right side pain and Flank Pain   HPI  Courtney Murphy is a 68 y.o. female with CHF diabetes hypertension PE who comes ED complaining of right side pain that started this morning, gradually worsening.  Worse with deep breathing.  No chest pain shortness of breath or cough.  No dysuria vomiting diarrhea.        Past Medical History:  Diagnosis Date   Acid reflux 10/11/2015   Anemia    RECEIVES IRON  INFUSIONS   Arthritis    Asthma    WELL CONTROLLED   CHF (congestive heart failure) (HCC)    Depression    Diabetes mellitus without complication (HCC)    Fatty liver    Gout    History of kidney stones    History of methicillin resistant staphylococcus aureus (MRSA) 2016   Hypertension    H/O BEEN OFF BP MEDS FOR 1 YEAR   Hypothyroidism    Kidney stone    Knee pain    Left   Pulmonary emboli (HCC) 2011   Restless leg syndrome    Shoulder pain     Current Outpatient Rx   Order #: [id number] Class: Historical Med   Order #: [id number] Class: Historical Med   Order #: [id number] Class: Normal   Order #: [id number] Class: Historical Med   Order #: [id number] Class: Normal   Order #: [id number] Class: Historical Med   Order #: [id number] Class: Normal   Order #: [id number] Class: Historical Med   Order #: [id number] Class: Historical Med   Order #: [id number] Class: Historical Med   Order #: [id number] Class: Historical Med   Order #: [id number] Class: Historical Med   Order #: [id number] Class: Normal   Order #: [id number] Class: Historical Med   Order #: [id number] Class: Historical Med   Order #: [id number] Class: Historical Med   Order #: [id number] Class: Normal    Past Surgical History:  Procedure Laterality Date   ABDOMINAL HYSTERECTOMY     BACK SURGERY     BREAST REDUCTION SURGERY     CATARACT EXTRACTION W/PHACO  Left 07/28/2017   Procedure: CATARACT EXTRACTION PHACO AND INTRAOCULAR LENS PLACEMENT (IOC) diabetic Left;  Surgeon: Mittie Gaskin, MD;  Location: [facility];  Service: Ophthalmology;  Laterality: Left;  Diabetic   CESAREAN SECTION  1990   CHOLECYSTECTOMY     CYSTOSCOPY/URETEROSCOPY/HOLMIUM LASER/STENT PLACEMENT Left 09/08/2017   Procedure: CYSTOSCOPY/URETEROSCOPY/HOLMIUM LASER/STENT EXCHANGE;  Surgeon: Penne Knee, MD;  Location: [facility];  Service: Urology;  Laterality: Left;   CYSTOSCOPY/URETEROSCOPY/HOLMIUM LASER/STENT PLACEMENT Left 12/14/2018   Procedure: CYSTOSCOPY/URETEROSCOPY/HOLMIUM LASER/STENT PLACEMENT;  Surgeon: Penne Knee, MD;  Location: [facility];  Service: Urology;  Laterality: Left;   FOOT SURGERY     GASTRIC BYPASS     IR NEPHROSTOMY PLACEMENT LEFT  08/17/2017   KNEE ARTHROSCOPY Left    KNEE ARTHROSCOPY Left 07/19/2015   Procedure: ARTHROSCOPY KNEE;  Surgeon: Helayne Glenn, MD;  Location: [facility];  Service: Orthopedics;  Laterality: Left;   NEPHROLITHOTOMY Left 08/17/2017   Procedure: NEPHROLITHOTOMY PERCUTANEOUS WITH HOLMIUM LASER;  Surgeon: Penne Knee, MD;  Location: [facility];  Service: Urology;  Laterality: Left;   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/2021   THUMB ARTHROSCOPY     TONSILLECTOMY      Physical Exam   Triage Vital Signs: ED Triage Vitals [11/24/24 1815]  Encounter Vitals Group  BP      Girls Systolic BP Percentile      Girls Diastolic BP Percentile      Boys Systolic BP Percentile      Boys Diastolic BP Percentile      Pulse Rate 96     Resp 16     Temp 98.2 F (36.8 C)     Temp src      SpO2 99 %     Weight 160 lb (72.6 kg)     Height 5' 4 (1.626 m)     Head Circumference      Peak Flow      Pain Score 8     Pain Loc      Pain Education      Exclude from Growth Chart     Most recent vital signs: Vitals:   11/24/24 1815 11/24/24 2203  Pulse: 96   Resp: 16   Temp: 98.2 F (36.8 C)   SpO2: 99% 99%     General: Awake, no distress.  CV:  Good peripheral perfusion.  Normal distal pulses.  Regular rate rhythm Resp:  Normal effort.  Clear lungs Abd:  No distention.  Soft with right lateral abdominal pain just inferior to the inferior rib margin Other:  No lower extremity edema   ED Results / Procedures / Treatments   Labs (all labs ordered are listed, but only abnormal results are displayed) Labs Reviewed  URINALYSIS, ROUTINE W REFLEX MICROSCOPIC - Abnormal; Notable for the following components:      Result Value   Color, Urine YELLOW (*)    APPearance HAZY (*)    Glucose, UA >=500 (*)    Leukocytes,Ua SMALL (*)    Bacteria, UA RARE (*)    All other components within normal limits  BASIC METABOLIC PANEL WITH GFR - Abnormal; Notable for the following components:   Potassium 3.2 (*)    Chloride 97 (*)    Glucose, Bld 126 (*)    Creatinine, Ser 1.01 (*)    All other components within normal limits  CBC     EKG    RADIOLOGY CT chest abdomen pelvis pending   PROCEDURES:  Procedures   MEDICATIONS ORDERED IN ED: Medications  iohexol  (OMNIPAQUE ) 350 MG/ML injection 100 mL (100 mLs Intravenous Contrast Given 11/24/24 2249)     IMPRESSION / MDM / ASSESSMENT AND PLAN / ED COURSE  I reviewed the triage vital signs and the nursing notes.  DDx: PE, abdominal wall strain, appendicitis, diverticulitis, ureterolithiasis  Patient's presentation is most consistent with acute presentation with potential threat to life or bodily function.  Patient presents with right lateral abdominal pain, reproducible on exam.  Most likely musculoskeletal, however with the severe pain and exquisite tenderness she represents, will need CT of the abdomen.  Additionally, because of her high risk for PE including prior pulmonary embolism, recent surgery 2 months ago, pleuritic nature of the symptoms, will need to obtain CT of the chest.  Vital signs are normal, she is nontoxic.  She has been  taking her prescribed 4 times daily oxycodone .  Labs unremarkable.  Counseled patient on likely diagnosis, will obtain CT       FINAL CLINICAL IMPRESSION(S) / ED DIAGNOSES   Final diagnoses:  Right flank pain  Type 2 diabetes mellitus without complication, without long-term current use of insulin  (HCC)  Chronic congestive heart failure, unspecified heart failure type (HCC)     Rx / DC Orders   ED Discharge Orders  None        Note:  This document was prepared using Dragon voice recognition software and may include unintentional dictation errors.   Viviann Pastor, MD 11/24/24 2304  "

## 2024-11-24 NOTE — ED Triage Notes (Signed)
 Pt states that she started having right side pain, pt holding her right flank area, states it started as a dull ache but has progressively gotten worse as the day has continued, states it hurts to take a breath in and out.denies painful urination, denies cough or fever, does report hx of kidney stones

## 2024-11-24 NOTE — Discharge Instructions (Addendum)
 Start taking Senokot 2 capsules twice a day, as well as MiraLAX  twice a day until you have regular bowel movements.  Continue taking Senokot indefinitely.

## 2024-12-07 ENCOUNTER — Ambulatory Visit: Admitting: Cardiology

## 2025-01-29 ENCOUNTER — Inpatient Hospital Stay: Admitting: Internal Medicine

## 2025-01-29 ENCOUNTER — Inpatient Hospital Stay
